# Patient Record
Sex: Female | Born: 1945 | Race: White | Hispanic: No | Smoking: Never smoker
Health system: Southern US, Community
[De-identification: ages and names within clinical notes are randomized; demographics above are authoritative.]

## PROBLEM LIST (undated history)

## (undated) DIAGNOSIS — K5790 Diverticulosis of intestine, part unspecified, without perforation or abscess without bleeding: Secondary | ICD-10-CM

## (undated) DIAGNOSIS — B029 Zoster without complications: Secondary | ICD-10-CM

## (undated) DIAGNOSIS — E119 Type 2 diabetes mellitus without complications: Secondary | ICD-10-CM

## (undated) DIAGNOSIS — C801 Malignant (primary) neoplasm, unspecified: Secondary | ICD-10-CM

## (undated) DIAGNOSIS — M6530 Trigger finger, unspecified finger: Secondary | ICD-10-CM

## (undated) DIAGNOSIS — I8390 Asymptomatic varicose veins of unspecified lower extremity: Secondary | ICD-10-CM

## (undated) DIAGNOSIS — H409 Unspecified glaucoma: Secondary | ICD-10-CM

## (undated) DIAGNOSIS — H269 Unspecified cataract: Secondary | ICD-10-CM

## (undated) DIAGNOSIS — R300 Dysuria: Secondary | ICD-10-CM

## (undated) DIAGNOSIS — K222 Esophageal obstruction: Secondary | ICD-10-CM

## (undated) DIAGNOSIS — R7989 Other specified abnormal findings of blood chemistry: Secondary | ICD-10-CM

## (undated) DIAGNOSIS — K449 Diaphragmatic hernia without obstruction or gangrene: Secondary | ICD-10-CM

## (undated) DIAGNOSIS — K219 Gastro-esophageal reflux disease without esophagitis: Secondary | ICD-10-CM

## (undated) DIAGNOSIS — E785 Hyperlipidemia, unspecified: Secondary | ICD-10-CM

## (undated) DIAGNOSIS — C50919 Malignant neoplasm of unspecified site of unspecified female breast: Secondary | ICD-10-CM

## (undated) DIAGNOSIS — M25559 Pain in unspecified hip: Secondary | ICD-10-CM

## (undated) DIAGNOSIS — G43009 Migraine without aura, not intractable, without status migrainosus: Secondary | ICD-10-CM

## (undated) HISTORY — DX: Dysuria: R30.0

## (undated) HISTORY — PX: ESOPHAGEAL DILATION: SHX303

## (undated) HISTORY — DX: Zoster without complications: B02.9

## (undated) HISTORY — DX: Unspecified glaucoma: H40.9

## (undated) HISTORY — DX: Pain in unspecified hip: M25.559

## (undated) HISTORY — DX: Type 2 diabetes mellitus without complications: E11.9

## (undated) HISTORY — DX: Migraine without aura, not intractable, without status migrainosus: G43.009

## (undated) HISTORY — DX: Trigger finger, unspecified finger: M65.30

## (undated) HISTORY — DX: Asymptomatic varicose veins of unspecified lower extremity: I83.90

## (undated) HISTORY — DX: Gastro-esophageal reflux disease without esophagitis: K21.9

## (undated) HISTORY — DX: Unspecified cataract: H26.9

## (undated) HISTORY — DX: Hyperlipidemia, unspecified: E78.5

## (undated) HISTORY — DX: Malignant neoplasm of unspecified site of unspecified female breast: C50.919

## (undated) HISTORY — DX: Malignant (primary) neoplasm, unspecified: C80.1

## (undated) HISTORY — DX: Diverticulosis of intestine, part unspecified, without perforation or abscess without bleeding: K57.90

## (undated) HISTORY — DX: Esophageal obstruction: K22.2

## (undated) HISTORY — DX: Other specified abnormal findings of blood chemistry: R79.89

## (undated) HISTORY — DX: Diaphragmatic hernia without obstruction or gangrene: K44.9

---

## 1971-07-27 HISTORY — PX: APPENDECTOMY: SHX54

## 1997-11-26 ENCOUNTER — Other Ambulatory Visit: Admission: RE | Admit: 1997-11-26 | Discharge: 1997-11-26 | Payer: Self-pay | Admitting: Obstetrics and Gynecology

## 1998-12-11 ENCOUNTER — Other Ambulatory Visit: Admission: RE | Admit: 1998-12-11 | Discharge: 1998-12-11 | Payer: Self-pay | Admitting: Obstetrics and Gynecology

## 2000-02-25 ENCOUNTER — Other Ambulatory Visit: Admission: RE | Admit: 2000-02-25 | Discharge: 2000-02-25 | Payer: Self-pay | Admitting: Obstetrics and Gynecology

## 2008-06-25 ENCOUNTER — Ambulatory Visit (HOSPITAL_COMMUNITY): Admission: RE | Admit: 2008-06-25 | Discharge: 2008-06-25 | Payer: Self-pay | Admitting: Family Medicine

## 2008-07-16 ENCOUNTER — Encounter: Admission: RE | Admit: 2008-07-16 | Discharge: 2008-07-16 | Payer: Self-pay | Admitting: Family Medicine

## 2008-07-16 ENCOUNTER — Encounter (INDEPENDENT_AMBULATORY_CARE_PROVIDER_SITE_OTHER): Payer: Self-pay | Admitting: Diagnostic Radiology

## 2008-07-24 ENCOUNTER — Encounter: Admission: RE | Admit: 2008-07-24 | Discharge: 2008-07-24 | Payer: Self-pay | Admitting: Family Medicine

## 2008-08-29 ENCOUNTER — Encounter (INDEPENDENT_AMBULATORY_CARE_PROVIDER_SITE_OTHER): Payer: Self-pay | Admitting: Surgery

## 2008-08-29 ENCOUNTER — Ambulatory Visit (HOSPITAL_COMMUNITY): Admission: RE | Admit: 2008-08-29 | Discharge: 2008-08-29 | Payer: Self-pay | Admitting: Surgery

## 2008-08-29 ENCOUNTER — Encounter: Admission: RE | Admit: 2008-08-29 | Discharge: 2008-08-29 | Payer: Self-pay | Admitting: Surgery

## 2008-08-29 DIAGNOSIS — C50511 Malignant neoplasm of lower-outer quadrant of right female breast: Secondary | ICD-10-CM | POA: Insufficient documentation

## 2008-08-29 HISTORY — PX: BREAST LUMPECTOMY: SHX2

## 2008-09-03 ENCOUNTER — Ambulatory Visit: Payer: Self-pay | Admitting: Oncology

## 2008-09-18 LAB — CBC WITH DIFFERENTIAL/PLATELET
Basophils Absolute: 0 10*3/uL (ref 0.0–0.1)
EOS%: 1.8 % (ref 0.0–7.0)
HCT: 40.5 % (ref 34.8–46.6)
HGB: 13.8 g/dL (ref 11.6–15.9)
MCH: 33 pg (ref 25.1–34.0)
MCV: 96.9 fL (ref 79.5–101.0)
MONO%: 8.1 % (ref 0.0–14.0)
NEUT%: 58.6 % (ref 38.4–76.8)
RDW: 12.6 % (ref 11.2–14.5)

## 2008-09-19 LAB — COMPREHENSIVE METABOLIC PANEL
AST: 17 U/L (ref 0–37)
Alkaline Phosphatase: 76 U/L (ref 39–117)
BUN: 12 mg/dL (ref 6–23)
Creatinine, Ser: 0.64 mg/dL (ref 0.40–1.20)
Total Bilirubin: 0.5 mg/dL (ref 0.3–1.2)

## 2008-09-24 ENCOUNTER — Encounter: Admission: RE | Admit: 2008-09-24 | Discharge: 2008-09-24 | Payer: Self-pay | Admitting: Oncology

## 2008-09-24 ENCOUNTER — Ambulatory Visit: Admission: RE | Admit: 2008-09-24 | Discharge: 2008-11-26 | Payer: Self-pay | Admitting: Radiation Oncology

## 2008-09-24 LAB — HM DEXA SCAN

## 2008-10-16 DIAGNOSIS — E6609 Other obesity due to excess calories: Secondary | ICD-10-CM | POA: Insufficient documentation

## 2008-10-16 DIAGNOSIS — E66812 Obesity, class 2: Secondary | ICD-10-CM | POA: Insufficient documentation

## 2008-11-13 ENCOUNTER — Ambulatory Visit: Payer: Self-pay | Admitting: Oncology

## 2009-02-12 ENCOUNTER — Ambulatory Visit: Payer: Self-pay | Admitting: Oncology

## 2009-02-14 LAB — CBC WITH DIFFERENTIAL/PLATELET
Basophils Absolute: 0 10*3/uL (ref 0.0–0.1)
Eosinophils Absolute: 0.2 10*3/uL (ref 0.0–0.5)
HCT: 37.5 % (ref 34.8–46.6)
HGB: 12.8 g/dL (ref 11.6–15.9)
MCH: 33.4 pg (ref 25.1–34.0)
MONO#: 0.4 10*3/uL (ref 0.1–0.9)
NEUT#: 3.6 10*3/uL (ref 1.5–6.5)
NEUT%: 69.9 % (ref 38.4–76.8)
WBC: 5.2 10*3/uL (ref 3.9–10.3)
lymph#: 0.9 10*3/uL (ref 0.9–3.3)

## 2009-02-28 ENCOUNTER — Encounter: Admission: RE | Admit: 2009-02-28 | Discharge: 2009-02-28 | Payer: Self-pay | Admitting: Gastroenterology

## 2009-06-03 ENCOUNTER — Ambulatory Visit: Payer: Self-pay | Admitting: Oncology

## 2009-06-05 LAB — CBC WITH DIFFERENTIAL/PLATELET
Basophils Absolute: 0 10*3/uL (ref 0.0–0.1)
EOS%: 2.8 % (ref 0.0–7.0)
HGB: 12.7 g/dL (ref 11.6–15.9)
MCH: 33.8 pg (ref 25.1–34.0)
MCV: 99.6 fL (ref 79.5–101.0)
MONO%: 8.9 % (ref 0.0–14.0)
RBC: 3.77 10*6/uL (ref 3.70–5.45)
RDW: 13.1 % (ref 11.2–14.5)

## 2009-06-05 LAB — COMPREHENSIVE METABOLIC PANEL
AST: 26 U/L (ref 0–37)
Albumin: 3.7 g/dL (ref 3.5–5.2)
Alkaline Phosphatase: 56 U/L (ref 39–117)
BUN: 9 mg/dL (ref 6–23)
Potassium: 4 mEq/L (ref 3.5–5.3)
Total Bilirubin: 0.7 mg/dL (ref 0.3–1.2)

## 2009-06-06 LAB — VITAMIN D 25 HYDROXY (VIT D DEFICIENCY, FRACTURES): Vit D, 25-Hydroxy: 42 ng/mL (ref 30–89)

## 2009-06-13 ENCOUNTER — Ambulatory Visit (HOSPITAL_COMMUNITY): Admission: RE | Admit: 2009-06-13 | Discharge: 2009-06-13 | Payer: Self-pay | Admitting: Oncology

## 2009-06-23 ENCOUNTER — Ambulatory Visit (HOSPITAL_COMMUNITY): Admission: RE | Admit: 2009-06-23 | Discharge: 2009-06-23 | Payer: Self-pay | Admitting: Obstetrics and Gynecology

## 2009-06-23 ENCOUNTER — Encounter: Admission: RE | Admit: 2009-06-23 | Discharge: 2009-06-23 | Payer: Self-pay | Admitting: Oncology

## 2009-12-01 ENCOUNTER — Ambulatory Visit: Payer: Self-pay | Admitting: Oncology

## 2009-12-02 LAB — CBC WITH DIFFERENTIAL/PLATELET
EOS%: 1.9 % (ref 0.0–7.0)
LYMPH%: 25.4 % (ref 14.0–49.7)
MCH: 34.5 pg — ABNORMAL HIGH (ref 25.1–34.0)
MCHC: 35 g/dL (ref 31.5–36.0)
MCV: 98.6 fL (ref 79.5–101.0)
MONO%: 10 % (ref 0.0–14.0)
Platelets: 261 10*3/uL (ref 145–400)
RBC: 3.75 10*6/uL (ref 3.70–5.45)
RDW: 12.8 % (ref 11.2–14.5)

## 2009-12-02 LAB — COMPREHENSIVE METABOLIC PANEL
AST: 17 U/L (ref 0–37)
Albumin: 4.3 g/dL (ref 3.5–5.2)
Alkaline Phosphatase: 69 U/L (ref 39–117)
Glucose, Bld: 102 mg/dL — ABNORMAL HIGH (ref 70–99)
Potassium: 4.6 mEq/L (ref 3.5–5.3)
Sodium: 138 mEq/L (ref 135–145)
Total Bilirubin: 0.4 mg/dL (ref 0.3–1.2)
Total Protein: 7.1 g/dL (ref 6.0–8.3)

## 2009-12-02 LAB — VITAMIN D 25 HYDROXY (VIT D DEFICIENCY, FRACTURES): Vit D, 25-Hydroxy: 54 ng/mL (ref 30–89)

## 2009-12-02 LAB — CANCER ANTIGEN 27.29: CA 27.29: 22 U/mL (ref 0–39)

## 2010-06-26 ENCOUNTER — Encounter
Admission: RE | Admit: 2010-06-26 | Discharge: 2010-06-26 | Payer: Self-pay | Source: Home / Self Care | Admitting: Oncology

## 2010-07-01 ENCOUNTER — Ambulatory Visit: Payer: Self-pay | Admitting: Oncology

## 2010-07-03 LAB — CBC WITH DIFFERENTIAL/PLATELET
Basophils Absolute: 0 10*3/uL (ref 0.0–0.1)
Eosinophils Absolute: 0.1 10*3/uL (ref 0.0–0.5)
HCT: 40 % (ref 34.8–46.6)
HGB: 13.6 g/dL (ref 11.6–15.9)
MCH: 34.2 pg — ABNORMAL HIGH (ref 25.1–34.0)
MCV: 100.5 fL (ref 79.5–101.0)
NEUT#: 3.8 10*3/uL (ref 1.5–6.5)
NEUT%: 64 % (ref 38.4–76.8)
RDW: 12.8 % (ref 11.2–14.5)
lymph#: 1.4 10*3/uL (ref 0.9–3.3)

## 2010-07-04 LAB — COMPREHENSIVE METABOLIC PANEL
Albumin: 4.3 g/dL (ref 3.5–5.2)
BUN: 9 mg/dL (ref 6–23)
CO2: 29 mEq/L (ref 19–32)
Calcium: 9.7 mg/dL (ref 8.4–10.5)
Chloride: 101 mEq/L (ref 96–112)
Creatinine, Ser: 0.67 mg/dL (ref 0.40–1.20)
Glucose, Bld: 101 mg/dL — ABNORMAL HIGH (ref 70–99)
Potassium: 4.4 mEq/L (ref 3.5–5.3)

## 2010-07-04 LAB — VITAMIN D 25 HYDROXY (VIT D DEFICIENCY, FRACTURES): Vit D, 25-Hydroxy: 71 ng/mL (ref 30–89)

## 2010-07-04 LAB — LACTATE DEHYDROGENASE: LDH: 148 U/L (ref 94–250)

## 2010-07-04 LAB — CANCER ANTIGEN 27.29: CA 27.29: 26 U/mL (ref 0–39)

## 2010-07-14 ENCOUNTER — Ambulatory Visit (HOSPITAL_COMMUNITY)
Admission: RE | Admit: 2010-07-14 | Discharge: 2010-07-14 | Payer: Self-pay | Source: Home / Self Care | Attending: Oncology | Admitting: Oncology

## 2010-08-27 ENCOUNTER — Other Ambulatory Visit: Payer: Self-pay | Admitting: Oncology

## 2010-08-27 DIAGNOSIS — Z780 Asymptomatic menopausal state: Secondary | ICD-10-CM

## 2010-10-06 ENCOUNTER — Ambulatory Visit
Admission: RE | Admit: 2010-10-06 | Discharge: 2010-10-06 | Disposition: A | Discharge status: Home / Self Care | Payer: Medicare Other | Source: Ambulatory Visit | Attending: Oncology | Admitting: Oncology

## 2010-10-06 DIAGNOSIS — Z780 Asymptomatic menopausal state: Secondary | ICD-10-CM

## 2010-11-10 LAB — DIFFERENTIAL
Basophils Absolute: 0 10*3/uL (ref 0.0–0.1)
Eosinophils Absolute: 0.1 10*3/uL (ref 0.0–0.7)
Eosinophils Relative: 1 % (ref 0–5)
Lymphocytes Relative: 32 % (ref 12–46)
Neutrophils Relative %: 59 % (ref 43–77)

## 2010-11-10 LAB — BASIC METABOLIC PANEL WITH GFR
BUN: 9 mg/dL (ref 6–23)
CO2: 29 meq/L (ref 19–32)
Calcium: 10 mg/dL (ref 8.4–10.5)
Chloride: 101 meq/L (ref 96–112)
Creatinine, Ser: 0.6 mg/dL (ref 0.4–1.2)
GFR calc Af Amer: >60 mL/min (ref >60)
GFR calc non Af Amer: >60 mL/min (ref >60)
Glucose, Bld: 107 mg/dL — ABNORMAL HIGH (ref 70–99)
Potassium: 4.2 meq/L (ref 3.5–5.1)
Sodium: 139 meq/L (ref 135–145)

## 2010-11-10 LAB — CBC
HCT: 42 % (ref 36.0–46.0)
Platelets: 267 10*3/uL (ref 150–400)
RDW: 12.8 % (ref 11.5–15.5)

## 2010-12-08 NOTE — Op Note (Signed)
[name], [name]                 ACCOUNT NO.:  [id number]   MEDICAL RECORD NO.:  [id number]          PATIENT TYPE:  AMB   LOCATION:  SDS                          FACILITY:  [facility]   PHYSICIAN:  Thomas A. Cornett, M.D.DATE OF BIRTH:  [birth year]   DATE OF PROCEDURE:  08/29/2008  DATE OF DISCHARGE:  08/29/2008                               OPERATIVE REPORT   PREOPERATIVE DIAGNOSIS:  Right breast cancer, stage I.   POSTOPERATIVE DIAGNOSIS:  Right breast cancer, stage I.   PROCEDURE:  1. Right breast needle-localized lumpectomy.  2. Right axillary sentinel lymph node mapping with injection of      methylene blue dye.   SURGEON:  Maisie Fus A. Cornett, MD   ANESTHESIA:  General LMA with 0.25% Sensorcaine local with epinephrine.   ESTIMATED BLOOD LOSS:  20 mL.   SPECIMEN:  1. Two right axillary sentinel lymph nodes negative by touch prep.  2. Right breast mass localizing wire clip and an additional anterior      margin to pathology for evaluation.   INDICATIONS FOR PROCEDURE:  The patient is a 65 year old postmenopausal  female who is found to have a very small right breast cancer in the  right upper outer quadrant of her right breast.  We talked about  options.  She wished to conserve her breast and presents today for right  breast lumpectomy and subsequent sentinel lymph node mapping for breast  conservative measures stage I right breast cancer.   DESCRIPTION OF PROCEDURE:  The patient was brought to the operating room  and placed spine.  After induction of anesthesia, the patient had  already undergone wire localization and injection of technetium sulfur  colloid prior to arriving to the operating room.  After induction of  general anesthesia, right breast and right axilla were exposed.  I  injected 4 mL of methylene blue dye in a subareolar position in the  right breast.  Five minutes were allowed to elapse.  Right breast and  right axilla were prepped and draped in a sterile  fashion.  Sentinel  node was done first.  NeoProbe was used to identify a hot region in the  right axilla.  The tumor is in the right upper outer quadrant of her  breast and that is where the wire exited from her needle localization.  A curvilinear incision was made halfway between the wire and the axilla  to do both parts of the procedure through one incision.  Dissection was  carried down until I entered the axilla.  NeoProbe was used and one hot  blue and one just hot sentinel node were identified and passed off the  field.  There was no other significant activity in her axilla upon  inspection with a NeoProbe.  Next, lumpectomy was done.  I brought the  wire out to the incision.  All tissue around the wire was excised in its  entirety.  I also took an additional anterior margin since this seemed  close to me after looking at her films and feeling the specimen.  Her  other margins were down to  the chest wall posteriorly and the superior  and inferior margins appeared grossly negative.  Medial margin also  appeared grossly negative.  Radiographs were done which showed the  specimen to be adequate with a clip wire and tumor on within it.  Irrigation was used and suctioned out.  The axilla was examined and  found be hemostatic after irrigation.  Surgicel was placed in the right  axilla.  I closed the deep layer of tissue for the axilla with a 3-0  Vicryl.  I then closed the lumpectomy cavity down  with a 3-0 Vicryl as well at this point in time.  Skin was closed with 4-  0 Monocryl.  Dermabond was applied.  All final counts of sponge, needle,  and instruments were found to be correct at this portion of the case.  The patient was then awoken and taken to the recovery room in  satisfactory condition.      Thomas A. Cornett, M.D.  Electronically Signed     TAC/MEDQ  D:  08/29/2008  T:  08/29/2008  Job:  [id number]   cc:   Georgiann Mccoy, MD

## 2010-12-30 ENCOUNTER — Other Ambulatory Visit: Payer: Self-pay | Admitting: Oncology

## 2010-12-30 ENCOUNTER — Encounter (HOSPITAL_BASED_OUTPATIENT_CLINIC_OR_DEPARTMENT_OTHER): Payer: Medicare Other | Admitting: Oncology

## 2010-12-30 DIAGNOSIS — M818 Other osteoporosis without current pathological fracture: Secondary | ICD-10-CM

## 2010-12-30 DIAGNOSIS — Z170 Estrogen receptor positive status [ER+]: Secondary | ICD-10-CM

## 2010-12-30 DIAGNOSIS — C50419 Malignant neoplasm of upper-outer quadrant of unspecified female breast: Secondary | ICD-10-CM

## 2010-12-30 LAB — COMPREHENSIVE METABOLIC PANEL
ALT: 18 U/L (ref 0–35)
CO2: 29 mEq/L (ref 19–32)
Calcium: 10.3 mg/dL (ref 8.4–10.5)
Chloride: 102 mEq/L (ref 96–112)
Sodium: 138 mEq/L (ref 135–145)
Total Protein: 7.7 g/dL (ref 6.0–8.3)

## 2010-12-30 LAB — CBC WITH DIFFERENTIAL/PLATELET
BASO%: 0.4 % (ref 0.0–2.0)
HCT: 40.4 % (ref 34.8–46.6)
MCHC: 34 g/dL (ref 31.5–36.0)
MONO#: 0.6 10*3/uL (ref 0.1–0.9)
RBC: 4 10*6/uL (ref 3.70–5.45)
WBC: 5.9 10*3/uL (ref 3.9–10.3)
lymph#: 1.8 10*3/uL (ref 0.9–3.3)

## 2010-12-30 LAB — CANCER ANTIGEN 27.29: CA 27.29: 26 U/mL (ref 0–39)

## 2011-01-08 ENCOUNTER — Encounter: Payer: Medicare Other | Admitting: Oncology

## 2011-01-21 ENCOUNTER — Other Ambulatory Visit: Payer: Self-pay | Admitting: Gastroenterology

## 2011-01-21 ENCOUNTER — Ambulatory Visit (HOSPITAL_COMMUNITY)
Admission: RE | Admit: 2011-01-21 | Discharge: 2011-01-21 | Disposition: A | Discharge status: Home / Self Care | Payer: Medicare Other | Source: Ambulatory Visit | Attending: Gastroenterology | Admitting: Gastroenterology

## 2011-01-21 DIAGNOSIS — K222 Esophageal obstruction: Secondary | ICD-10-CM | POA: Insufficient documentation

## 2011-01-21 DIAGNOSIS — R1310 Dysphagia, unspecified: Secondary | ICD-10-CM | POA: Insufficient documentation

## 2011-02-17 NOTE — Op Note (Signed)
  NAME:  [name], [name] NO.:  [id number]  MEDICAL RECORD NO.:  [id number]  LOCATION:                                 FACILITY:  PHYSICIAN:  Graylin Shiver, M.D.   DATE OF BIRTH:  [birth year]  DATE OF PROCEDURE: DATE OF DISCHARGE:                              OPERATIVE REPORT   PROCEDURE:  Upper endoscopy with biopsy and endoscopic balloon dilatation.  INDICATIONS FOR PROCEDURE:  Dysphagia, prior barium swallow showed a Schatzki's ring.  Informed consent was obtained after explanation of the risks of bleeding, infection, and perforation.  PREMEDICATION:  Fentanyl 50 mcg IV, Versed 6 mg IV.  DESCRIPTION OF PROCEDURE:  With the patient in the left lateral decubitus position, the PentaX gastroscope was inserted into the oropharynx and passed into the esophagus.  It was advanced down the esophagus, then into the stomach, and into the duodenum.  The second portion of the duodenum looked normal.  In the bulb of the duodenum, there was a small ulcer.  The stomach was inspected and there was a small ulcer in the distal stomach.  Biopsy of this was obtained and also biopsy of the antrum primarily to look for H. pylori.  The rest of the stomach had normal-appearing mucosa.  There was a hiatal hernia.  The esophagus revealed a stricture at the region of the distal esophagus, which looked like a combination of a Schatzki's ring and peptic stricture.  I was able to pass the 10-mm scope through this area without difficulty.  There was erosive distal esophagitis above the strictured area.  Biopsy of this was obtained.  An endoscopic balloon dilator was advanced down the scope and placed at the level of the stricture.  The stricture was dilated to 12, then 13.5, then 15 mm.  She tolerated the procedure well without complications.  IMPRESSION: 1. Distal esophageal stricture, which looks like a combination of a     Schatzki's ring and a peptic stricture. 2. Distal  esophagitis, which was erosive in nature. 3. Small gastric ulcer. 4. Small duodenal ulcer. 5. Hiatal hernia.  PLAN:  Observe response to the dilatation, proceed with using Prilosec 20 mg p.o. b.i.d.          ______________________________ Graylin Shiver, M.D.    SFG/MEDQ  D:  01/22/2011  T:  01/22/2011  Job:  [id number]  Electronically Signed by Herbert Moors MD on 02/17/2011 03:43:55 PM

## 2011-03-04 ENCOUNTER — Ambulatory Visit (INDEPENDENT_AMBULATORY_CARE_PROVIDER_SITE_OTHER): Payer: Medicare Other | Admitting: Surgery

## 2011-03-04 ENCOUNTER — Encounter (INDEPENDENT_AMBULATORY_CARE_PROVIDER_SITE_OTHER): Payer: Self-pay | Admitting: Surgery

## 2011-03-04 DIAGNOSIS — Z853 Personal history of malignant neoplasm of breast: Secondary | ICD-10-CM

## 2011-03-04 NOTE — Patient Instructions (Signed)
Follow up in 1 year.

## 2011-03-04 NOTE — Progress Notes (Signed)
Subjective:     Patient ID: [name], female   DOB: 21-Apr-1946, 65 y.o.   MRN: [id number]  HPI The patient returns today in followup for her stage I right breast cancer. She underwent a right breast lumpectomy with sentinel lymph node mapping in February of 2010 for a T1 N0 MX ER positive PR positive HER-2/neu negative right breast cancer. She underwent lumpectomy and subsequent post operative radiation therapy. She is being maintained on femara. She still has some mild discomfort in her right breast. She denies any nipple discharge or any new masses that she can feel.  Review of Systems  Constitutional: Negative.   HENT: Negative.   Eyes: Negative.   Respiratory: Negative.   Cardiovascular: Negative.   Gastrointestinal: Negative.   Genitourinary: Negative.   Neurological: Negative.   Hematological: Negative.   Psychiatric/Behavioral: Negative.        Objective:   Physical Exam  Constitutional: She appears well-developed and well-nourished.  HENT:  Head: Normocephalic and atraumatic.  Nose: Nose normal.  Eyes: Conjunctivae and EOM are normal. Pupils are equal, round, and reactive to light.  Neck: Normal range of motion. Neck supple.  Cardiovascular: Normal rate, regular rhythm and normal heart sounds.   Pulmonary/Chest: Effort normal and breath sounds normal.       Both breasts were examined today. Postsurgical changes noted right breast. Minimal volume loss to right breast. No suspicious masses in the right breast. Chronic inversion of right nipple noted. Right axilla is normal. Left breast is normal. The left axilla normal  Lymphadenopathy:    She has no cervical adenopathy.       Assessment:     Stage I right breast cancer    Plan:     Her next mammogram is due in December 2012. Her mammogram from 2011 and was within normal limits. She will continue her followup with Dr. Donnie Coffin I will see her back.

## 2011-04-30 DIAGNOSIS — B0051 Herpesviral iridocyclitis: Secondary | ICD-10-CM | POA: Insufficient documentation

## 2011-04-30 DIAGNOSIS — H40049 Steroid responder, unspecified eye: Secondary | ICD-10-CM | POA: Insufficient documentation

## 2011-04-30 DIAGNOSIS — Z9849 Cataract extraction status, unspecified eye: Secondary | ICD-10-CM | POA: Insufficient documentation

## 2011-05-19 ENCOUNTER — Encounter (HOSPITAL_BASED_OUTPATIENT_CLINIC_OR_DEPARTMENT_OTHER): Payer: Medicare Other | Admitting: Oncology

## 2011-05-19 DIAGNOSIS — Z23 Encounter for immunization: Secondary | ICD-10-CM

## 2011-05-19 DIAGNOSIS — C50419 Malignant neoplasm of upper-outer quadrant of unspecified female breast: Secondary | ICD-10-CM

## 2011-05-27 ENCOUNTER — Other Ambulatory Visit: Payer: Self-pay | Admitting: Oncology

## 2011-05-27 DIAGNOSIS — Z853 Personal history of malignant neoplasm of breast: Secondary | ICD-10-CM

## 2011-06-29 ENCOUNTER — Ambulatory Visit
Admission: RE | Admit: 2011-06-29 | Discharge: 2011-06-29 | Disposition: A | Discharge status: Home / Self Care | Payer: Medicare Other | Source: Ambulatory Visit | Attending: Oncology | Admitting: Oncology

## 2011-06-29 DIAGNOSIS — Z853 Personal history of malignant neoplasm of breast: Secondary | ICD-10-CM

## 2011-07-08 ENCOUNTER — Other Ambulatory Visit: Payer: Self-pay | Admitting: Oncology

## 2011-07-08 ENCOUNTER — Other Ambulatory Visit (HOSPITAL_BASED_OUTPATIENT_CLINIC_OR_DEPARTMENT_OTHER): Payer: Medicare Other | Admitting: Lab

## 2011-07-08 DIAGNOSIS — C50419 Malignant neoplasm of upper-outer quadrant of unspecified female breast: Secondary | ICD-10-CM

## 2011-07-08 LAB — COMPREHENSIVE METABOLIC PANEL
AST: 20 U/L (ref 0–37)
Alkaline Phosphatase: 58 U/L (ref 39–117)
BUN: 12 mg/dL (ref 6–23)
Creatinine, Ser: 0.59 mg/dL (ref 0.50–1.10)

## 2011-07-08 LAB — CBC WITH DIFFERENTIAL/PLATELET
Basophils Absolute: 0 10*3/uL (ref 0.0–0.1)
EOS%: 1.4 % (ref 0.0–7.0)
HCT: 39.6 % (ref 34.8–46.6)
HGB: 13.3 g/dL (ref 11.6–15.9)
LYMPH%: 27.4 % (ref 14.0–49.7)
MCH: 33.3 pg (ref 25.1–34.0)
MCV: 99.2 fL (ref 79.5–101.0)
MONO%: 8.3 % (ref 0.0–14.0)
NEUT%: 62.6 % (ref 38.4–76.8)
Platelets: 249 10*3/uL (ref 145–400)

## 2011-07-08 LAB — VITAMIN D 25 HYDROXY (VIT D DEFICIENCY, FRACTURES): Vit D, 25-Hydroxy: 50 ng/mL (ref 30–89)

## 2011-07-15 ENCOUNTER — Ambulatory Visit (HOSPITAL_BASED_OUTPATIENT_CLINIC_OR_DEPARTMENT_OTHER): Payer: Medicare Other | Admitting: Oncology

## 2011-07-15 VITALS — BP 146/78 | HR 84 | Temp 99.1°F | Ht 64.0 in | Wt 214.6 lb

## 2011-07-15 DIAGNOSIS — M899 Disorder of bone, unspecified: Secondary | ICD-10-CM

## 2011-07-15 DIAGNOSIS — Z79811 Long term (current) use of aromatase inhibitors: Secondary | ICD-10-CM

## 2011-07-15 DIAGNOSIS — C50919 Malignant neoplasm of unspecified site of unspecified female breast: Secondary | ICD-10-CM

## 2011-07-15 DIAGNOSIS — Z170 Estrogen receptor positive status [ER+]: Secondary | ICD-10-CM

## 2011-07-15 DIAGNOSIS — M949 Disorder of cartilage, unspecified: Secondary | ICD-10-CM

## 2011-07-15 MED ORDER — LETROZOLE 2.5 MG PO TABS: 2.5000 mg | ORAL_TABLET | Freq: Every day | ORAL | Status: AC

## 2011-07-15 NOTE — Progress Notes (Signed)
Hematology and Oncology Follow Up Visit  [name] [id number] 1946/01/10 65 y.o. 07/15/2011 3:32 PM PCP Gaspar Garbe Principle Diagnosis: T1bNo er+pr+ breast cancer, s/p lumpevtomy with completion of xrt 4/10, on femara 2. Hx of shingles affectig her eye.  Interim History:  There have been no intercurrent illness, hospitalizations or medication changes. Ongoing visual problems with evoluton of cataracts on rt, glaucoma; previous hx of shingles.Has been on multiple  Courses of valtrex( generic doesn't work).  Medications: I have reviewed the patient's current medications.  Allergies:  Allergies  Allergen Reactions  . Penicillins   . Thimerosal     Makes pt eye red     Past Medical History, Surgical history, Social history, and Family History were reviewed and updated.  Review of Systems: Constitutional:  Negative for fever, chills, night sweats, anorexia, weight loss, pain. Cardiovascular: no chest pain or dyspnea on exertion Respiratory: no cough, shortness of breath, or wheezing Neurological: no TIA or stroke symptoms Dermatological: negative ENT: positive for - visual changes Skin Gastrointestinal: no abdominal pain, change in bowel habits, or black or bloody stools Genito-Urinary: no dysuria, trouble voiding, or hematuria Hematological and Lymphatic: negative Breast: negative for breast lumps Musculoskeletal: negative Remaining ROS negative.  Physical Exam: Blood pressure 146/78, pulse 84, temperature 99.1 F (37.3 C), height 5\' 4"  (1.626 m), weight 214 lb 9.6 oz (97.342 kg). ECOG:  General appearance: alert, cooperative and appears stated age Head: Normocephalic, without obvious abnormality, atraumatic Neck: no adenopathy, no carotid bruit, no JVD, supple, symmetrical, trachea midline and thyroid not enlarged, symmetric, no tenderness/mass/nodules Lymph nodes: Cervical, supraclavicular, and axillary nodes normal. Cardiac : regular rate and rhythm, no murmurs or  gallops Pulmonary:clear to auscultation bilaterally and normal percussion bilaterally Breasts: inspection negative, no nipple discharge or bleeding, no masses or nodularity palpable, frt nipple retracted -chronic Abdomen:soft, non-tender; bowel sounds normal; no masses,  no organomegaly Extremities negative Neuro: alert, oriented, normal speech, no focal findings or movement disorder noted  Lab Results: Lab Results  Component Value Date   WBC 5.5 07/08/2011   HGB 13.3 07/08/2011   HCT 39.6 07/08/2011   MCV 99.2 07/08/2011   PLT 249 07/08/2011     Chemistry      Component Value Date/Time   NA 142 07/08/2011 1414   NA 142 07/08/2011 1414   K 4.4 07/08/2011 1414   K 4.4 07/08/2011 1414   CL 102 07/08/2011 1414   CL 102 07/08/2011 1414   CO2 30 07/08/2011 1414   CO2 30 07/08/2011 1414   BUN 12 07/08/2011 1414   BUN 12 07/08/2011 1414   CREATININE 0.59 07/08/2011 1414   CREATININE 0.59 07/08/2011 1414      Component Value Date/Time   CALCIUM 10.6* 07/08/2011 1414   CALCIUM 10.6* 07/08/2011 1414   ALKPHOS 58 07/08/2011 1414   ALKPHOS 58 07/08/2011 1414   AST 20 07/08/2011 1414   AST 20 07/08/2011 1414   ALT 18 07/08/2011 1414   ALT 18 07/08/2011 1414   BILITOT 0.4 07/08/2011 1414   BILITOT 0.4 07/08/2011 1414      .pathology. Radiological Studies: chest X-ray n/a Mammogram Recent 12/12-wnl Bone density 3/12- improved, minimal osteopenia  Impression and Plan: N-, er+ breast cancer, on femara, trolerated well, NED; f/u 6 months.  More than 50% of the visit was spent in patient-related counselling   Pierce Crane, MD 12/20/20123:32 PM

## 2011-08-03 DIAGNOSIS — Z9889 Other specified postprocedural states: Secondary | ICD-10-CM | POA: Diagnosis not present

## 2011-08-03 DIAGNOSIS — H33319 Horseshoe tear of retina without detachment, unspecified eye: Secondary | ICD-10-CM | POA: Diagnosis not present

## 2011-08-03 DIAGNOSIS — H269 Unspecified cataract: Secondary | ICD-10-CM | POA: Diagnosis not present

## 2011-08-03 DIAGNOSIS — H33311 Horseshoe tear of retina without detachment, right eye: Secondary | ICD-10-CM | POA: Insufficient documentation

## 2011-08-03 DIAGNOSIS — B0051 Herpesviral iridocyclitis: Secondary | ICD-10-CM | POA: Diagnosis not present

## 2011-08-03 DIAGNOSIS — H2510 Age-related nuclear cataract, unspecified eye: Secondary | ICD-10-CM | POA: Diagnosis not present

## 2011-08-03 DIAGNOSIS — H40049 Steroid responder, unspecified eye: Secondary | ICD-10-CM | POA: Diagnosis not present

## 2011-08-18 ENCOUNTER — Other Ambulatory Visit: Payer: Self-pay | Admitting: Oncology

## 2011-08-18 DIAGNOSIS — C50419 Malignant neoplasm of upper-outer quadrant of unspecified female breast: Secondary | ICD-10-CM

## 2011-08-18 DIAGNOSIS — M818 Other osteoporosis without current pathological fracture: Secondary | ICD-10-CM

## 2011-08-25 DIAGNOSIS — H269 Unspecified cataract: Secondary | ICD-10-CM | POA: Diagnosis not present

## 2011-09-01 DIAGNOSIS — K219 Gastro-esophageal reflux disease without esophagitis: Secondary | ICD-10-CM | POA: Insufficient documentation

## 2011-09-01 DIAGNOSIS — Z9089 Acquired absence of other organs: Secondary | ICD-10-CM | POA: Diagnosis not present

## 2011-09-01 DIAGNOSIS — B0051 Herpesviral iridocyclitis: Secondary | ICD-10-CM | POA: Diagnosis not present

## 2011-09-01 DIAGNOSIS — Z9889 Other specified postprocedural states: Secondary | ICD-10-CM | POA: Diagnosis not present

## 2011-09-01 DIAGNOSIS — Z01810 Encounter for preprocedural cardiovascular examination: Secondary | ICD-10-CM | POA: Diagnosis not present

## 2011-09-01 DIAGNOSIS — E669 Obesity, unspecified: Secondary | ICD-10-CM | POA: Diagnosis not present

## 2011-09-01 DIAGNOSIS — H2589 Other age-related cataract: Secondary | ICD-10-CM | POA: Diagnosis not present

## 2011-09-02 DIAGNOSIS — K222 Esophageal obstruction: Secondary | ICD-10-CM | POA: Diagnosis not present

## 2011-09-06 DIAGNOSIS — H2589 Other age-related cataract: Secondary | ICD-10-CM | POA: Diagnosis not present

## 2011-09-06 DIAGNOSIS — Z9889 Other specified postprocedural states: Secondary | ICD-10-CM | POA: Diagnosis not present

## 2011-09-06 DIAGNOSIS — Z9089 Acquired absence of other organs: Secondary | ICD-10-CM | POA: Diagnosis not present

## 2011-09-06 DIAGNOSIS — E669 Obesity, unspecified: Secondary | ICD-10-CM | POA: Diagnosis not present

## 2011-09-06 DIAGNOSIS — K219 Gastro-esophageal reflux disease without esophagitis: Secondary | ICD-10-CM | POA: Diagnosis not present

## 2011-09-06 DIAGNOSIS — B0051 Herpesviral iridocyclitis: Secondary | ICD-10-CM | POA: Diagnosis not present

## 2011-09-14 DIAGNOSIS — H269 Unspecified cataract: Secondary | ICD-10-CM | POA: Diagnosis not present

## 2011-10-05 DIAGNOSIS — H2010 Chronic iridocyclitis, unspecified eye: Secondary | ICD-10-CM | POA: Diagnosis not present

## 2011-10-05 DIAGNOSIS — H269 Unspecified cataract: Secondary | ICD-10-CM | POA: Diagnosis not present

## 2011-10-11 DIAGNOSIS — M79609 Pain in unspecified limb: Secondary | ICD-10-CM | POA: Diagnosis not present

## 2011-10-11 DIAGNOSIS — E785 Hyperlipidemia, unspecified: Secondary | ICD-10-CM | POA: Diagnosis not present

## 2011-10-11 DIAGNOSIS — K222 Esophageal obstruction: Secondary | ICD-10-CM | POA: Diagnosis not present

## 2011-10-13 DIAGNOSIS — R7989 Other specified abnormal findings of blood chemistry: Secondary | ICD-10-CM | POA: Diagnosis not present

## 2011-10-13 DIAGNOSIS — I10 Essential (primary) hypertension: Secondary | ICD-10-CM | POA: Diagnosis not present

## 2011-10-13 DIAGNOSIS — E785 Hyperlipidemia, unspecified: Secondary | ICD-10-CM | POA: Diagnosis not present

## 2011-10-26 DIAGNOSIS — H269 Unspecified cataract: Secondary | ICD-10-CM | POA: Diagnosis not present

## 2011-10-26 DIAGNOSIS — H35379 Puckering of macula, unspecified eye: Secondary | ICD-10-CM | POA: Diagnosis not present

## 2011-10-26 DIAGNOSIS — B0051 Herpesviral iridocyclitis: Secondary | ICD-10-CM | POA: Diagnosis not present

## 2011-12-28 DIAGNOSIS — B0051 Herpesviral iridocyclitis: Secondary | ICD-10-CM | POA: Diagnosis not present

## 2012-01-07 ENCOUNTER — Other Ambulatory Visit: Payer: Self-pay | Admitting: Oncology

## 2012-01-07 ENCOUNTER — Other Ambulatory Visit (HOSPITAL_BASED_OUTPATIENT_CLINIC_OR_DEPARTMENT_OTHER): Payer: Medicare Other | Admitting: Lab

## 2012-01-07 DIAGNOSIS — E559 Vitamin D deficiency, unspecified: Secondary | ICD-10-CM

## 2012-01-07 DIAGNOSIS — C50919 Malignant neoplasm of unspecified site of unspecified female breast: Secondary | ICD-10-CM

## 2012-01-07 DIAGNOSIS — C50419 Malignant neoplasm of upper-outer quadrant of unspecified female breast: Secondary | ICD-10-CM

## 2012-01-07 LAB — CBC WITH DIFFERENTIAL/PLATELET
BASO%: 0.6 % (ref 0.0–2.0)
EOS%: 1.5 % (ref 0.0–7.0)
MCH: 33.3 pg (ref 25.1–34.0)
MCHC: 33.9 g/dL (ref 31.5–36.0)
MCV: 98.4 fL (ref 79.5–101.0)
MONO%: 11.7 % (ref 0.0–14.0)
NEUT#: 4.1 10*3/uL (ref 1.5–6.5)
RBC: 3.81 10*6/uL (ref 3.70–5.45)
RDW: 12.6 % (ref 11.2–14.5)

## 2012-01-08 LAB — COMPREHENSIVE METABOLIC PANEL
AST: 15 U/L (ref 0–37)
Albumin: 3.9 g/dL (ref 3.5–5.2)
Alkaline Phosphatase: 60 U/L (ref 39–117)
Potassium: 4.3 mEq/L (ref 3.5–5.3)
Sodium: 141 mEq/L (ref 135–145)
Total Protein: 6.6 g/dL (ref 6.0–8.3)

## 2012-01-13 ENCOUNTER — Telehealth: Payer: Self-pay | Admitting: *Deleted

## 2012-01-13 DIAGNOSIS — I10 Essential (primary) hypertension: Secondary | ICD-10-CM | POA: Diagnosis not present

## 2012-01-13 DIAGNOSIS — E785 Hyperlipidemia, unspecified: Secondary | ICD-10-CM | POA: Diagnosis not present

## 2012-01-13 NOTE — Telephone Encounter (Signed)
per orders from 01-13-2012 moved patient to 01-18-2012 at 4:00pm patient confirmed over the phone

## 2012-01-14 ENCOUNTER — Ambulatory Visit: Payer: Medicare Other | Admitting: Oncology

## 2012-01-18 ENCOUNTER — Telehealth: Payer: Self-pay | Admitting: *Deleted

## 2012-01-18 ENCOUNTER — Ambulatory Visit (HOSPITAL_BASED_OUTPATIENT_CLINIC_OR_DEPARTMENT_OTHER): Payer: Medicare Other | Admitting: Oncology

## 2012-01-18 VITALS — BP 153/79 | HR 80 | Temp 97.9°F | Ht 64.0 in | Wt 216.3 lb

## 2012-01-18 DIAGNOSIS — N390 Urinary tract infection, site not specified: Secondary | ICD-10-CM

## 2012-01-18 DIAGNOSIS — Z170 Estrogen receptor positive status [ER+]: Secondary | ICD-10-CM

## 2012-01-18 DIAGNOSIS — C50419 Malignant neoplasm of upper-outer quadrant of unspecified female breast: Secondary | ICD-10-CM | POA: Diagnosis not present

## 2012-01-18 DIAGNOSIS — R52 Pain, unspecified: Secondary | ICD-10-CM

## 2012-01-18 DIAGNOSIS — R350 Frequency of micturition: Secondary | ICD-10-CM | POA: Diagnosis not present

## 2012-01-18 DIAGNOSIS — C50919 Malignant neoplasm of unspecified site of unspecified female breast: Secondary | ICD-10-CM

## 2012-01-18 MED ORDER — SULFAMETHOXAZOLE-TRIMETHOPRIM 800-160 MG PO TABS: 1.0000 | ORAL_TABLET | Freq: Two times a day (BID) | ORAL | Status: AC

## 2012-01-18 MED ORDER — ANASTROZOLE 1 MG PO TABS: 1.0000 mg | ORAL_TABLET | Freq: Every day | ORAL | Status: AC

## 2012-01-18 NOTE — Progress Notes (Signed)
Hematology and Oncology Follow Up Visit  [name] [id number] Dec 13, 1945 66 y.o. 01/18/2012 4:22 PM PCP Gaspar Garbe Principle Diagnosis: T1bNo er+pr+ breast cancer, s/p lumpevtomy with completion of xrt 4/10, on femara 2. Hx of shingles affectig her eye.  Interim History:  There have been no intercurrent illness, hospitalizations or medication changes. Ongoing visual problems with evoluton of cataracts on rt, glaucoma; previous hx of shingles.Has been on multiple  Courses of valtrex( generic doesn't work). She ultimately underwent cataract surgery recently which is helped quite a bit. She is her glaucoma no eye is better. She has noticed that there is a lot more achiness that she's been experiencing over the past little while. Her right hip is also somewhat worse. She is trying to walk and lose weight but this is been difficult. She recently was on a red yeast rice which made her achiness a lot worse but she still has this despite not taking that medication. She is also having symptoms suggestive of UTI.  Medications: I have reviewed the patient's current medications.  Allergies:  Allergies  Allergen Reactions  . Adhesive (Tape) Dermatitis  . Cosopt (Dorzolamide Hcl-Timolol Mal) Other (See Comments)    Turns eye red, increases eye pressure  . Penicillins   . Tamoxifen Other (See Comments)    Vaginal bleeding  . Thimerosal     Makes pt eye red     Past Medical History, Surgical history, Social history, and Family History were reviewed and updated.  Review of Systems: Constitutional:  Negative for fever, chills, night sweats, anorexia, weight loss, pain. Cardiovascular: no chest pain or dyspnea on exertion Respiratory: no cough, shortness of breath, or wheezing Neurological: no TIA or stroke symptoms Dermatological: negative ENT: positive for - visual changes Skin Gastrointestinal: no abdominal pain, change in bowel habits, or black or bloody stools Genito-Urinary: no  dysuria, trouble voiding, or hematuria Hematological and Lymphatic: negative Breast: negative for breast lumps Musculoskeletal: negative Remaining ROS negative.  Physical Exam: Blood pressure 153/79, pulse 80, temperature 97.9 F (36.6 C), height 5\' 4"  (1.626 m), weight 216 lb 4.8 oz (98.113 kg). ECOG: 0 General appearance: alert, cooperative and appears stated age Head: Normocephalic, without obvious abnormality, atraumatic Neck: no adenopathy, no carotid bruit, no JVD, supple, symmetrical, trachea midline and thyroid not enlarged, symmetric, no tenderness/mass/nodules Lymph nodes: Cervical, supraclavicular, and axillary nodes normal. Cardiac : regular rate and rhythm, no murmurs or gallops Pulmonary:clear to auscultation bilaterally and normal percussion bilaterally Breasts: inspection negative, no nipple discharge or bleeding, no masses or nodularity palpable, frt nipple retracted -chronic, generally a lumpy breasts bilaterally Abdomen:soft, non-tender; bowel sounds normal; no masses,  no organomegaly Extremities negative Neuro: alert, oriented, normal speech, no focal findings or movement disorder noted  Lab Results: Lab Results  Component Value Date   WBC 6.3 01/07/2012   HGB 12.7 01/07/2012   HCT 37.5 01/07/2012   MCV 98.4 01/07/2012   PLT 216 01/07/2012     Chemistry      Component Value Date/Time   NA 141 01/07/2012 1245   K 4.3 01/07/2012 1245   CL 105 01/07/2012 1245   CO2 28 01/07/2012 1245   BUN 13 01/07/2012 1245   CREATININE 0.59 01/07/2012 1245      Component Value Date/Time   CALCIUM 9.4 01/07/2012 1245   ALKPHOS 60 01/07/2012 1245   AST 15 01/07/2012 1245   ALT 16 01/07/2012 1245   BILITOT 0.6 01/07/2012 1245      .pathology. Radiological Studies: chest X-ray  n/a Mammogram Recent 12/12-wnl Bone density 3/12- improved, minimal osteopenia  Impression and Plan: Node-negative ER/PR positive breast cancer, status post lumpectomy and radiation. Currently on  Femara. I discussed with her strategy to discontinue the Femara for 2 weeks. Her achiness improves then I would switch her to Arimidex. I have called a prescription in. If had her achiness persists it is likely not the Femara and therefore I would continue with that. As far as her UTI is concerned I started on Septra and we'll also gather a urine sample today. I will see her in 6 months time for followup mammogram. I also counseled her about weight loss given her current BMI.  More than 50% of the visit was spent in patient-related counselling   Pierce Crane, MD 6/25/20134:22 PM

## 2012-01-18 NOTE — Telephone Encounter (Signed)
Made patient appointment for mammogram at the breast center made patient appointment for lab one week before md appointment

## 2012-01-19 ENCOUNTER — Other Ambulatory Visit: Payer: Self-pay | Admitting: Oncology

## 2012-01-19 ENCOUNTER — Other Ambulatory Visit: Payer: Self-pay | Admitting: *Deleted

## 2012-01-19 DIAGNOSIS — IMO0001 Reserved for inherently not codable concepts without codable children: Secondary | ICD-10-CM

## 2012-01-19 LAB — URINALYSIS, MICROSCOPIC - CHCC
Bilirubin (Urine): NEGATIVE
Glucose: NEGATIVE g/dL
RBC count: NEGATIVE (ref 0–2)

## 2012-02-17 ENCOUNTER — Other Ambulatory Visit: Payer: Self-pay | Admitting: Oncology

## 2012-02-17 DIAGNOSIS — M818 Other osteoporosis without current pathological fracture: Secondary | ICD-10-CM

## 2012-03-09 ENCOUNTER — Ambulatory Visit (INDEPENDENT_AMBULATORY_CARE_PROVIDER_SITE_OTHER): Payer: Medicare Other | Admitting: Surgery

## 2012-03-30 DIAGNOSIS — B0051 Herpesviral iridocyclitis: Secondary | ICD-10-CM | POA: Diagnosis not present

## 2012-03-30 DIAGNOSIS — H33319 Horseshoe tear of retina without detachment, unspecified eye: Secondary | ICD-10-CM | POA: Diagnosis not present

## 2012-03-30 DIAGNOSIS — H4040X0 Glaucoma secondary to eye inflammation, unspecified eye, stage unspecified: Secondary | ICD-10-CM | POA: Diagnosis not present

## 2012-03-30 DIAGNOSIS — Z961 Presence of intraocular lens: Secondary | ICD-10-CM | POA: Diagnosis not present

## 2012-03-30 DIAGNOSIS — Z9849 Cataract extraction status, unspecified eye: Secondary | ICD-10-CM | POA: Diagnosis not present

## 2012-03-30 DIAGNOSIS — D3130 Benign neoplasm of unspecified choroid: Secondary | ICD-10-CM | POA: Diagnosis not present

## 2012-03-30 DIAGNOSIS — H409 Unspecified glaucoma: Secondary | ICD-10-CM | POA: Diagnosis not present

## 2012-03-30 DIAGNOSIS — Z9889 Other specified postprocedural states: Secondary | ICD-10-CM | POA: Diagnosis not present

## 2012-04-04 ENCOUNTER — Ambulatory Visit (INDEPENDENT_AMBULATORY_CARE_PROVIDER_SITE_OTHER): Payer: Medicare Other | Admitting: Surgery

## 2012-04-13 DIAGNOSIS — Z23 Encounter for immunization: Secondary | ICD-10-CM | POA: Diagnosis not present

## 2012-04-20 ENCOUNTER — Ambulatory Visit (INDEPENDENT_AMBULATORY_CARE_PROVIDER_SITE_OTHER): Payer: Medicare Other | Admitting: Surgery

## 2012-04-20 ENCOUNTER — Encounter (INDEPENDENT_AMBULATORY_CARE_PROVIDER_SITE_OTHER): Payer: Self-pay | Admitting: Surgery

## 2012-04-20 VITALS — BP 130/78 | HR 76 | Temp 97.6°F | Resp 12 | Ht 64.0 in | Wt 217.6 lb

## 2012-04-20 DIAGNOSIS — Z853 Personal history of malignant neoplasm of breast: Secondary | ICD-10-CM | POA: Diagnosis not present

## 2012-04-20 NOTE — Patient Instructions (Signed)
Return 1 year. 

## 2012-04-20 NOTE — Progress Notes (Signed)
Subjective:     Patient ID: [name], female   DOB: 1945-11-28, 66 y.o.   MRN: [id number]  HPI The patient returns today in followup for her stage I right breast cancer. She underwent a right breast lumpectomy with sentinel lymph node mapping in February of 2010 for a T1 N0 MX ER positive PR positive HER-2/neu negative right breast cancer. She underwent lumpectomy and subsequent post operative radiation therapy. She is being maintained on femara. She still has some mild discomfort in her right breast. She denies any nipple discharge or any new masses that she can feel. Has arthalgia.  Review of Systems  Constitutional: Negative.   HENT: Negative.   Eyes: Negative.   Respiratory: Negative.   Cardiovascular: Negative.   Gastrointestinal: Negative.   Genitourinary: Negative.   Neurological: Negative.   Hematological: Negative.   Psychiatric/Behavioral: Negative.        Objective:   Physical Exam  Constitutional: She appears well-developed and well-nourished.  HENT:  Head: Normocephalic and atraumatic.  Nose: Nose normal.  Eyes: Conjunctivae and EOM are normal. Pupils are equal, round, and reactive to light.  Neck: Normal range of motion. Neck supple.  Cardiovascular: Normal rate, regular rhythm and normal heart sounds.   Pulmonary/Chest: Effort normal and breath sounds normal.       Both breasts were examined today. Postsurgical changes noted right breast. Minimal volume loss to right breast. No suspicious masses in the right breast. Chronic inversion of right nipple noted. Right axilla is normal. Left breast is normal. The left axilla normal  Lymphadenopathy:    She has no cervical adenopathy.   Clinical Data: 66 year old female with history of right breast  cancer and lumpectomy in 2010. For annual bilateral mammograms.  DIGITAL DIAGNOSTIC BILATERAL MAMMOGRAM with CAD  Comparison: 06/26/2010, 06/23/2009 and prior mammograms dating back  to 06/25/2008  Findings: MLO and CC  views of both breasts and a spot tangential  view of the lumpectomy site performed.  Scarring in the upper outer right breast again identified.  There is no evidence of mass, nonsurgical distortion, or suspicious  calcifications.  The breast tissue is primarily fatty.  Mammographic images were processed with CAD.  IMPRESSION:  No specific mammographic evidence of breast malignancy.  Right breast scarring.  These findings were discussed with the patient. She was encouraged  to begin/continue monthly self exams and to contact her primary  physician if any changes noted.  BI-RADS CATEGORY 2: Benign finding(s).  Recommend bilateral diagnostic mammograms in 1 year.  Original Report Authenticated By: Rosendo Gros, M.D.     Assessment:     Stage I right breast cancer    Plan:     Her next mammogram is due in December 2013. Her mammogram from 2011 and was within normal limits. She will continue her followup with Dr. Donnie Coffin I will see her back.

## 2012-05-03 DIAGNOSIS — L819 Disorder of pigmentation, unspecified: Secondary | ICD-10-CM | POA: Diagnosis not present

## 2012-05-03 DIAGNOSIS — D485 Neoplasm of uncertain behavior of skin: Secondary | ICD-10-CM | POA: Diagnosis not present

## 2012-05-03 DIAGNOSIS — D235 Other benign neoplasm of skin of trunk: Secondary | ICD-10-CM | POA: Diagnosis not present

## 2012-07-05 ENCOUNTER — Other Ambulatory Visit: Payer: Self-pay | Admitting: *Deleted

## 2012-07-05 DIAGNOSIS — C50919 Malignant neoplasm of unspecified site of unspecified female breast: Secondary | ICD-10-CM

## 2012-07-05 MED ORDER — ANASTROZOLE 1 MG PO TABS: 1.0000 mg | ORAL_TABLET | Freq: Every day | ORAL | Status: DC

## 2012-07-14 DIAGNOSIS — B0051 Herpesviral iridocyclitis: Secondary | ICD-10-CM | POA: Diagnosis not present

## 2012-07-14 DIAGNOSIS — Z961 Presence of intraocular lens: Secondary | ICD-10-CM | POA: Diagnosis not present

## 2012-07-14 DIAGNOSIS — H40049 Steroid responder, unspecified eye: Secondary | ICD-10-CM | POA: Diagnosis not present

## 2012-07-14 DIAGNOSIS — Z9849 Cataract extraction status, unspecified eye: Secondary | ICD-10-CM | POA: Diagnosis not present

## 2012-07-18 ENCOUNTER — Telehealth: Payer: Self-pay | Admitting: *Deleted

## 2012-07-18 NOTE — Telephone Encounter (Signed)
left voice message to inform the patient of the cancelled appointment for 07-18-2012 and 07-28-2012

## 2012-07-20 ENCOUNTER — Ambulatory Visit
Admission: RE | Admit: 2012-07-20 | Discharge: 2012-07-20 | Disposition: A | Discharge status: Home / Self Care | Payer: Medicare Other | Source: Ambulatory Visit | Attending: Oncology | Admitting: Oncology

## 2012-07-20 DIAGNOSIS — C50919 Malignant neoplasm of unspecified site of unspecified female breast: Secondary | ICD-10-CM

## 2012-07-20 DIAGNOSIS — Z853 Personal history of malignant neoplasm of breast: Secondary | ICD-10-CM | POA: Diagnosis not present

## 2012-07-21 ENCOUNTER — Other Ambulatory Visit: Payer: Medicare Other | Admitting: Lab

## 2012-07-21 ENCOUNTER — Other Ambulatory Visit: Payer: Self-pay | Admitting: *Deleted

## 2012-07-21 ENCOUNTER — Other Ambulatory Visit (HOSPITAL_BASED_OUTPATIENT_CLINIC_OR_DEPARTMENT_OTHER): Payer: Medicare Other | Admitting: Lab

## 2012-07-21 ENCOUNTER — Ambulatory Visit: Payer: Medicare Other | Admitting: Oncology

## 2012-07-21 DIAGNOSIS — C50919 Malignant neoplasm of unspecified site of unspecified female breast: Secondary | ICD-10-CM | POA: Diagnosis not present

## 2012-07-21 DIAGNOSIS — C50419 Malignant neoplasm of upper-outer quadrant of unspecified female breast: Secondary | ICD-10-CM | POA: Diagnosis not present

## 2012-07-21 DIAGNOSIS — M818 Other osteoporosis without current pathological fracture: Secondary | ICD-10-CM

## 2012-07-21 LAB — CBC WITH DIFFERENTIAL/PLATELET
EOS%: 2.2 % (ref 0.0–7.0)
Eosinophils Absolute: 0.1 10*3/uL (ref 0.0–0.5)
LYMPH%: 30.9 % (ref 14.0–49.7)
MCH: 33.4 pg (ref 25.1–34.0)
MCHC: 34.9 g/dL (ref 31.5–36.0)
MCV: 95.7 fL (ref 79.5–101.0)
MONO%: 8.1 % (ref 0.0–14.0)
NEUT#: 3.9 10*3/uL (ref 1.5–6.5)
Platelets: 229 10*3/uL (ref 145–400)
RBC: 3.92 10*6/uL (ref 3.70–5.45)

## 2012-07-21 LAB — COMPREHENSIVE METABOLIC PANEL (CC13)
AST: 19 U/L (ref 5–34)
BUN: 12 mg/dL (ref 7.0–26.0)
Calcium: 9.9 mg/dL (ref 8.4–10.4)
Chloride: 102 mEq/L (ref 98–107)
Creatinine: 0.7 mg/dL (ref 0.6–1.1)
Total Bilirubin: 0.37 mg/dL (ref 0.20–1.20)

## 2012-07-21 MED ORDER — ANASTROZOLE 1 MG PO TABS: 1.0000 mg | ORAL_TABLET | Freq: Every day | ORAL | Status: DC

## 2012-07-21 MED ORDER — ALENDRONATE SODIUM 35 MG PO TABS: 35.0000 mg | ORAL_TABLET | ORAL | Status: DC

## 2012-07-22 LAB — CANCER ANTIGEN 27.29: CA 27.29: 24 U/mL (ref 0–39)

## 2012-07-24 ENCOUNTER — Telehealth: Payer: Self-pay | Admitting: Oncology

## 2012-07-24 NOTE — Telephone Encounter (Signed)
I talked to the patient on Friday in the waiting room, [name] was very upset that Dr. Donnie Coffin is leaving.   [name] wants to know if [name] will be seen by a medical oncologist. I relayed this information to our executive team.

## 2012-07-28 ENCOUNTER — Ambulatory Visit: Payer: Medicare Other | Admitting: Oncology

## 2012-08-07 ENCOUNTER — Telehealth: Payer: Self-pay | Admitting: Oncology

## 2012-08-07 NOTE — Telephone Encounter (Signed)
Called the patient to schedule her to be seen tomorrow.   She definitely wants to see Dr. Darnelle Catalan also.

## 2012-08-08 ENCOUNTER — Ambulatory Visit (HOSPITAL_BASED_OUTPATIENT_CLINIC_OR_DEPARTMENT_OTHER): Payer: Medicare Other | Admitting: Nurse Practitioner

## 2012-08-08 ENCOUNTER — Telehealth: Payer: Self-pay | Admitting: *Deleted

## 2012-08-08 ENCOUNTER — Encounter: Payer: Self-pay | Admitting: Nurse Practitioner

## 2012-08-08 VITALS — BP 161/81 | HR 80 | Temp 98.4°F | Resp 20 | Ht 64.0 in | Wt 219.6 lb

## 2012-08-08 DIAGNOSIS — Z170 Estrogen receptor positive status [ER+]: Secondary | ICD-10-CM

## 2012-08-08 DIAGNOSIS — M810 Age-related osteoporosis without current pathological fracture: Secondary | ICD-10-CM

## 2012-08-08 DIAGNOSIS — M949 Disorder of cartilage, unspecified: Secondary | ICD-10-CM

## 2012-08-08 DIAGNOSIS — M899 Disorder of bone, unspecified: Secondary | ICD-10-CM

## 2012-08-08 DIAGNOSIS — C50419 Malignant neoplasm of upper-outer quadrant of unspecified female breast: Secondary | ICD-10-CM | POA: Diagnosis not present

## 2012-08-08 NOTE — Progress Notes (Signed)
Hematology and Oncology Follow Up Visit  [name] [id number] [birth year] 67 y.o. 01/18/2012 4:22 PM PCP [name]   Principle Diagnosis: T1bN0  ER+ PR+ breast cancer, s/p lumpectomy with completion of xrt 4/10, currently on Anastrazole after debilitating aching/pain on Femara. 2. Hx of shingles affecting her eye.  Interim History:  [name] returns today for her regularly scheduled 6 month follow up of her T1bN0M0 breast cancer. She is doing much better on Anastrazole than she was on Femara. She continues to have some fatigue and states she has weakness in her legs because of lack of exercise. She did recently rejoin [employer] and looks forward to gaining strength in her legs. She struggles with insomnia, but this is a life long battle she has had and not a new symptom. 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 She continues to require ongoing courses of Valtrex to manager her shingles. Her cataracts were removed last year and have not recurred. She is doing well otherwise.   Medications: I have reviewed the patient's current medications. Current Outpatient Prescriptions on File Prior to Visit  Medication Sig Dispense Refill  . alendronate (FOSAMAX) 35 MG tablet Take 1 tablet (35 mg total) by mouth every 7 (seven) days. Take with a full glass of water on an empty stomach.  4 tablet  6  . anastrozole (ARIMIDEX) 1 MG tablet Take 1 tablet (1 mg total) by mouth daily.  30 tablet  3  . b complex vitamins tablet Take 1 tablet by mouth daily.        . calcium carbonate (OS-CAL) 600 MG TABS Take 600 mg by mouth 2 (two) times daily with a meal.        . Cholecalciferol (VITAMIN D3) 2000 UNITS CHEW Chew by mouth daily.        Marland Kitchen loteprednol (LOTEMAX) 0.5 % ophthalmic suspension Place 1 drop into both eyes 4 (four) times daily.        . Multiple Vitamin (MULTIVITAMIN) capsule Take 1 capsule by mouth daily.        . timolol (BETIMOL) 0.5  % ophthalmic solution 1 drop 2 (two) times daily.        . traMADol (ULTRAM) 50 MG tablet Ad lib.      . valACYclovir (VALTREX) 1000 MG tablet Take 1,000 mg by mouth daily.       Marland Kitchen omeprazole (PRILOSEC) 20 MG capsule Take 20 mg by mouth daily.          Allergies:  Allergies  Allergen Reactions  . Adhesive (Tape) Dermatitis  . Cosopt (Dorzolamide Hcl-Timolol Mal) Other (See Comments)    Turns eye red, increases eye pressure  . Penicillins   . Tamoxifen Other (See Comments)    Vaginal bleeding  . Thimerosal     Makes pt eye red     Past Medical History, Surgical history, Social history, and Family History were reviewed and updated.  Review of Systems: Constitutional:  Negative for fever, chills, night sweats, anorexia, weight loss, pain. Positive for insomnia Cardiovascular: no chest pain or dyspnea on exertion Respiratory: no cough, shortness of breath, or wheezing Dermatological: negative ENT: ongoing issues with shingles in her right eye but denies blurred vision, loss of balance, dizziness or headaches Skin: no new rashes Gastrointestinal: no abdominal pain, change in bowel habits, or black or bloody stools Genito-Urinary: no dysuria, trouble voiding, or hematuria Hematological and Lymphatic: negative Breast: negative for  breast lumps Musculoskeletal: states she feels like her legs are weak - that she has to push herself up to get out of a chair or sitting position Remaining ROS negative.  Physical Exam: Blood pressure 153/79, pulse 80, temperature 97.9 F (36.6 C), height 5\' 4"  (1.626 m), weight 216 lb 4.8 oz (98.113 kg). ECOG: 0 General appearance: alert, cooperative and appears stated age Head: right eye swollen, watering, reddened, EOMI Neck: no adenopathy, no carotid bruit, no JVD, supple, symmetrical, trachea midline and thyroid not enlarged, symmetric, no tenderness/mass/nodules Lymph nodes: Cervical, supraclavicular, axillary, and inguinal nodes non  palpable Cardiac : regular rate and rhythm, no murmurs or gallops Pulmonary:clear to auscultation bilaterally and normal percussion bilaterally Breasts: inspection negative, no nipple discharge or bleeding, no masses or nodularity palpable, rt nipple retracted -chronic, generally a lumpy breasts bilaterally Abdomen:soft, non-tender; bowel sounds normal; no masses,  no organomegaly Extremities negative Neuro: alert, oriented, normal speech, no focal findings or movement disorder noted  Radiology:  Last mammogram 07/20/2012 was unremarkable  Labs:  Patient had CBC and CMP drawn in December - they were reviewed and will be repeated at her next office visit in 6 months. Results for Katherine, [name] (MRN [id number]) as of 08/08/2012 14:42  Ref. Range 07/21/2012 15:36  Sodium Latest Range: 136-145 mEq/L 140  Potassium Latest Range: 3.5-5.1 mEq/L 3.9  Chloride Latest Range: 96-112 mEq/L 102  CO2 Latest Range: 19-32 mEq/L 26  BUN Latest Range: 7.0-26.0 mg/dL 40.9  Creatinine Latest Range: 0.50-1.10 mg/dL 0.7  Calcium Latest Range: 8.4-10.5 mg/dL 9.9  Glucose Latest Range: 70-99 mg/dl 811 (H)  Alkaline Phosphatase Latest Range: 39-117 U/L 65  Albumin Latest Range: 3.5-5.2 g/dL 3.8  AST Latest Range: 0-37 U/L 19  ALT Latest Range: 0-35 U/L 14  Total Protein Latest Range: 6.4-8.3 g/dL 7.4  Total Bilirubin Latest Range: 0.3-1.2 mg/dL 9.14   Results for Katherine, [name] (MRN [id number]) as of 08/08/2012 14:42  Ref. Range 07/21/2012 15:36  WBC Latest Range: 4.0-10.5 K/uL 6.6  RBC Latest Range: 3.87-5.11 MIL/uL 3.92  Hemoglobin Latest Range: 12.0-15.0 g/dL 08.6  HCT Latest Range: 36.0-46.0 % 37.5  MCV Latest Range: 78.0-100.0 fL 95.7  MCH Latest Range: 25.1-34.0 pg 33.4  MCHC Latest Range: 30.0-36.0 g/dL 57.8  RDW Latest Range: 11.5-15.5 % 13.2  Platelets Latest Range: 150-400 K/uL 229  NEUT% Latest Range: 38.4-76.8 % 58.2  LYMPH% Latest Range: 14.0-49.7 % 30.9  MONO% Latest Range: 0.0-14.0 % 8.1   EOS% Latest Range: 0.0-7.0 % 2.2  BASO% Latest Range: 0.0-2.0 % 0.6  NEUT# Latest Range: 1.7-7.7 K/uL 3.9  MONO# Latest Range: 0.1-0.9 10e3/uL 0.5  Eosinophils Absolute Latest Range: 0.0-0.5 10e3/uL 0.1  Basophils Absolute Latest Range: 0.0-0.1 K/uL 0.0  lymph# Latest Range: 0.9-3.3 10e3/uL 2.0     Impression and Plan: Node-negative ER/PR positive breast cancer, status post lumpectomy and radiation. Doing well on Anastrazole. She did meet Dr. Darnelle Catalan today, and will follow up with him in 6 months. Last mammogram was unremarkable Osteopenia - due for Dexa Bone Density scan in March of this year - we will schedule. She will continue on Fosamex More than 50% of the visit was spent in patient-related counselling   Bobbe Medico, AOCNP, NP-C

## 2012-08-08 NOTE — Telephone Encounter (Signed)
Made patient appointment for bone density 09-2012 [facility]  Made patient for md in 01-2013

## 2012-09-04 DIAGNOSIS — R1310 Dysphagia, unspecified: Secondary | ICD-10-CM | POA: Diagnosis not present

## 2012-09-09 ENCOUNTER — Other Ambulatory Visit: Payer: Self-pay

## 2012-09-18 ENCOUNTER — Telehealth: Payer: Self-pay | Admitting: Oncology

## 2012-09-18 NOTE — Telephone Encounter (Signed)
The patient called stating she felt put off during her follow up visit.   She came in knowing she would see a NP or PA and then meet Dr. Darnelle Catalan.  She said that Dr. Darnelle Catalan came in but she did not feel like he really knew her case- but was very nice.   She said this was not her treatment before Dr. Donnie Coffin left in fact she said she could not have received better care anywhere than what she had until as of late.  She feels like cattle being run through.   I listened, acknowledged and empathized.  She has spoken to our VP of Oncology in the past- and she said she might write him a letter.   She is having a dexa scan in a few weeks and I assured her I would follow up with her with the results.

## 2012-10-06 ENCOUNTER — Ambulatory Visit
Admission: RE | Admit: 2012-10-06 | Discharge: 2012-10-06 | Disposition: A | Discharge status: Home / Self Care | Payer: Medicare Other | Source: Ambulatory Visit | Attending: Nurse Practitioner | Admitting: Nurse Practitioner

## 2012-10-06 DIAGNOSIS — M810 Age-related osteoporosis without current pathological fracture: Secondary | ICD-10-CM

## 2012-10-06 DIAGNOSIS — M899 Disorder of bone, unspecified: Secondary | ICD-10-CM | POA: Diagnosis not present

## 2012-10-11 ENCOUNTER — Encounter: Payer: Self-pay | Admitting: Internal Medicine

## 2012-10-11 ENCOUNTER — Ambulatory Visit (INDEPENDENT_AMBULATORY_CARE_PROVIDER_SITE_OTHER): Payer: Medicare Other | Admitting: Internal Medicine

## 2012-10-11 VITALS — BP 146/86 | HR 79 | Temp 99.9°F | Resp 16 | Ht 64.0 in | Wt 219.6 lb

## 2012-10-11 DIAGNOSIS — D179 Benign lipomatous neoplasm, unspecified: Secondary | ICD-10-CM

## 2012-10-11 DIAGNOSIS — J069 Acute upper respiratory infection, unspecified: Secondary | ICD-10-CM | POA: Insufficient documentation

## 2012-10-11 DIAGNOSIS — H6120 Impacted cerumen, unspecified ear: Secondary | ICD-10-CM

## 2012-10-11 DIAGNOSIS — H6121 Impacted cerumen, right ear: Secondary | ICD-10-CM

## 2012-10-11 MED ORDER — GUAIFENESIN ER 600 MG PO TB12: 600.0000 mg | ORAL_TABLET | Freq: Two times a day (BID) | ORAL | Status: AC

## 2012-10-11 MED ORDER — AZITHROMYCIN 250 MG PO TABS: ORAL_TABLET | ORAL | Status: DC

## 2012-10-11 NOTE — Patient Instructions (Signed)
To complete course of antibiotics and to notify if you develop further fever or worsening of symptoms

## 2012-10-11 NOTE — Progress Notes (Signed)
  Subjective:    Patient ID: [name], female    DOB: 1946/05/26, 67 y.o.   MRN: [id number]  HPI She started feeling cold on Sunday and started with cough on Monday. She then started having muscle aches and in the evening developed fever with chills, temp of 100.4. The cough is mostly dry and she then started having runny nose and sneezing. This morning started having discomfort in her throat. She has been feeling stuffed up in her nose. She now also had headache and earache. Denies any ear discharge. Appetite has decreased and energy level is low.   Review of Systems  Constitutional: Positive for fever, chills and appetite change.       Decreased appetite  HENT: Positive for ear pain and congestion. Negative for rhinorrhea.   Respiratory: Positive for cough. Negative for chest tightness.   Cardiovascular: Negative for chest pain.  Gastrointestinal: Negative for diarrhea.  Musculoskeletal: Positive for myalgias.  Skin: Negative for rash.  Neurological: Positive for headaches.       Objective:   Physical Exam  Constitutional: She is oriented to person, place, and time. She appears well-developed.  HENT:  Head: Normocephalic and atraumatic.  Eyes: Conjunctivae are normal. Pupils are equal, round, and reactive to light. Right eye exhibits no discharge. Left eye exhibits no discharge.  Neck: Normal range of motion.  Right, single, enlarged and tender, mobile  Cardiovascular: Normal rate and regular rhythm.   Pulmonary/Chest: Effort normal and breath sounds normal. No respiratory distress. She has no wheezes. She has no rales. She exhibits no tenderness.  Abdominal: Soft.  Musculoskeletal: Normal range of motion.  Has  2 x3 cm mobile fat tissue over the right wrist joint on ulnar end, non tender, no inflammatory signs  Lymphadenopathy:    She has cervical adenopathy.  Neurological: She is alert and oriented to person, place, and time.  Skin: Skin is warm and dry.  Psychiatric:  She has a normal mood and affect.          Assessment & Plan:  Acute upper respiratory infections of unspecified site - With erythema of oropharynx and enlarged lymph node with uri symptoms are present. Will have her on azithromycin for 5 days. Will also have her on mucinex twice a day for a week and losenges for throat discomfort. To notify if develop hig grade temperature or worsening of symptoms  Lipoma of joint  Will monitor for now. No signs of infection and not affecting her quality of life.  Cerumen right ear- will get right ear lavage done

## 2012-10-27 DIAGNOSIS — B0051 Herpesviral iridocyclitis: Secondary | ICD-10-CM | POA: Diagnosis not present

## 2012-10-27 DIAGNOSIS — D3130 Benign neoplasm of unspecified choroid: Secondary | ICD-10-CM | POA: Diagnosis not present

## 2012-10-27 DIAGNOSIS — H33319 Horseshoe tear of retina without detachment, unspecified eye: Secondary | ICD-10-CM | POA: Diagnosis not present

## 2012-10-27 DIAGNOSIS — D3131 Benign neoplasm of right choroid: Secondary | ICD-10-CM | POA: Insufficient documentation

## 2012-10-27 DIAGNOSIS — Z8669 Personal history of other diseases of the nervous system and sense organs: Secondary | ICD-10-CM | POA: Diagnosis not present

## 2012-10-27 DIAGNOSIS — H40049 Steroid responder, unspecified eye: Secondary | ICD-10-CM | POA: Diagnosis not present

## 2012-10-27 DIAGNOSIS — Z961 Presence of intraocular lens: Secondary | ICD-10-CM | POA: Diagnosis not present

## 2012-11-01 DIAGNOSIS — R1310 Dysphagia, unspecified: Secondary | ICD-10-CM | POA: Diagnosis not present

## 2012-11-01 DIAGNOSIS — K222 Esophageal obstruction: Secondary | ICD-10-CM | POA: Diagnosis not present

## 2012-11-03 ENCOUNTER — Telehealth: Payer: Self-pay | Admitting: Oncology

## 2012-11-03 NOTE — Telephone Encounter (Signed)
I called the patient about her bone density results.   I explained in one are it has improved, and the other area it declined but between the two- Dr. Darnelle Catalan states this is stable.    She is taking fosamax and Dr. Darnelle Catalan wants her to continue.  She is scheduled to return in July for a follow up.

## 2012-11-21 ENCOUNTER — Encounter: Payer: Self-pay | Admitting: Internal Medicine

## 2012-12-06 ENCOUNTER — Other Ambulatory Visit: Payer: Self-pay | Admitting: Oncology

## 2012-12-06 DIAGNOSIS — C50919 Malignant neoplasm of unspecified site of unspecified female breast: Secondary | ICD-10-CM

## 2013-01-18 ENCOUNTER — Encounter: Payer: Self-pay | Admitting: *Deleted

## 2013-01-24 ENCOUNTER — Ambulatory Visit (INDEPENDENT_AMBULATORY_CARE_PROVIDER_SITE_OTHER): Payer: Medicare Other | Admitting: Internal Medicine

## 2013-01-24 ENCOUNTER — Encounter: Payer: Self-pay | Admitting: Internal Medicine

## 2013-01-24 VITALS — BP 150/92 | HR 72 | Temp 98.0°F | Resp 18 | Ht 64.0 in | Wt 223.0 lb

## 2013-01-24 DIAGNOSIS — C50511 Malignant neoplasm of lower-outer quadrant of right female breast: Secondary | ICD-10-CM

## 2013-01-24 DIAGNOSIS — M67431 Ganglion, right wrist: Secondary | ICD-10-CM

## 2013-01-24 DIAGNOSIS — K2210 Ulcer of esophagus without bleeding: Secondary | ICD-10-CM | POA: Diagnosis not present

## 2013-01-24 DIAGNOSIS — M6740 Ganglion, unspecified site: Secondary | ICD-10-CM

## 2013-01-24 DIAGNOSIS — C50519 Malignant neoplasm of lower-outer quadrant of unspecified female breast: Secondary | ICD-10-CM

## 2013-01-24 DIAGNOSIS — K222 Esophageal obstruction: Secondary | ICD-10-CM | POA: Diagnosis not present

## 2013-01-24 DIAGNOSIS — M5430 Sciatica, unspecified side: Secondary | ICD-10-CM

## 2013-01-24 DIAGNOSIS — M67439 Ganglion, unspecified wrist: Secondary | ICD-10-CM | POA: Insufficient documentation

## 2013-01-24 DIAGNOSIS — M5431 Sciatica, right side: Secondary | ICD-10-CM

## 2013-01-24 MED ORDER — HYDROCODONE-ACETAMINOPHEN 10-325 MG PO TABS: 1.0000 | ORAL_TABLET | Freq: Three times a day (TID) | ORAL | Status: DC | PRN

## 2013-01-24 NOTE — Progress Notes (Signed)
Subjective:    Patient ID: [name], female    DOB: 19-Dec-1945, 67 y.o.   MRN: [id number]  HPI Right sciatic nerve pain in the SI area, upper lateral and anterior thigh. Goes down into the right 3rd toe. Had problems through the winter. She slept on an air mattress which seemed to help. Tramadol does not help. Advil helps. May have been aggravated by picking up stepping stones when mowing.  Had esophageal dilation 02/19/2009 and Sep 05, 2012.  Esophageal ulcer in 2010. No current dysphagia. Done by Dr. Evette Cristal.  Colonoscopy in 2010.  History of shingles in the right temple and eye. She says she has relapsed on 2 occassions with inflammation of her eye. She is asking if she should get the zoster vaccine.  Sciatica of right side - Plan: DISCONTINUED: HYDROcodone-acetaminophen (NORCO) 10-325 MG per tablet  Ganglion cyst of wrist, right  Esophageal ulcer  Esophageal stenosis  Breast cancer of lower-outer quadrant of right female breast - Plan: CBC With differential/Platelet, Comprehensive metabolic panel  Current Outpatient Prescriptions on File Prior to Visit  Medication Sig Dispense Refill  . alendronate (FOSAMAX) 35 MG tablet Take 1 tablet (35 mg total) by mouth every 7 (seven) days. Take with a full glass of water on an empty stomach.  4 tablet  6  . anastrozole (ARIMIDEX) 1 MG tablet TAKE ONE TABLET BY MOUTH EVERY DAY  30 tablet  2  . b complex vitamins tablet Take 1 tablet by mouth daily.        . calcium carbonate (OS-CAL) 600 MG TABS Take 600 mg by mouth 2 (two) times daily with a meal. Take 1 tablet daily for bone health.      . Cholecalciferol (VITAMIN D3) 2000 UNITS CHEW Chew 2,000 Units by mouth daily. Take 1 tablet daily for vitamin D supplement.      . Multiple Vitamin (MULTIVITAMIN) capsule Take 1 capsule by mouth daily.        . Omega-3 Fatty Acids (FISH OIL) 1000 MG CAPS Take one tablet once daily to lower cholesterol      . omeprazole (PRILOSEC) 20 MG capsule Take one  tablet twice daily for reflux      . timolol (BETIMOL) 0.5 % ophthalmic solution 1 drop 2 (two) times daily.        . valACYclovir (VALTREX) 1000 MG tablet Take 1,000 mg by mouth daily.       . vitamin C (ASCORBIC ACID) 500 MG tablet Take one tablet once daily for supplement       No current facility-administered medications on file prior to visit.    Review of Systems  Constitutional: Positive for appetite change. Negative for fever and chills.       Decreased appetite  HENT: Positive for dental problem. Negative for ear pain, congestion and rhinorrhea.   Eyes:       History of glaucoma. History of cataracts. History of a torn retina in the right eye 03/06/2010 associated with a dense vitreous hemorrhage. She had laser therapy and cryotherapy to the right. History of herpes zoster of the right. Takes Valtrex continuously.  Respiratory: Negative for cough and chest tightness.   Cardiovascular: Negative for chest pain, palpitations and leg swelling.  Gastrointestinal: Negative for diarrhea.       History mild dysphagia. Food seems to hang in the chest.  Genitourinary: Negative for dysuria, urgency, frequency, hematuria, flank pain, enuresis and genital sores.       History of ovarian cysts  CT scan November 2002.  Musculoskeletal: Positive for myalgias.       Chronic back pains. Pain down the right leg suggestive of sciatica. Mild arthralgias in her hips and knees. Stable balance and gait.  Skin: Negative for pallor, rash and wound.  Neurological: Negative for headaches.  Hematological: Negative.   Psychiatric/Behavioral: Negative.  Negative for behavioral problems, confusion, sleep disturbance and agitation.       Objective:BP 150/92  Pulse 72  Temp(Src) 98 F (36.7 C) (Oral)  Resp 18  Ht 5\' 4"  (1.626 m)  Wt 223 lb (101.152 kg)  BMI 38.26 kg/m2  SpO2 96%    Physical Exam  Constitutional: She is oriented to person, place, and time. She appears well-developed and well-nourished.  No distress.  Moderately overweight.  HENT:  Head: Normocephalic and atraumatic.  Right Ear: External ear normal.  Left Ear: External ear normal.  Nose: Nose normal.  Mouth/Throat: Oropharynx is clear and moist.  Dentures.  Eyes: Conjunctivae and EOM are normal. Pupils are equal, round, and reactive to light.  Neck: Neck supple. No JVD present. No tracheal deviation present. No thyromegaly present.  Cardiovascular: Normal rate, normal heart sounds and intact distal pulses.  Exam reveals no gallop and no friction rub.   No murmur heard. Pulmonary/Chest: Breath sounds normal. No respiratory distress. She has no wheezes. She has no rales.  Abdominal: Bowel sounds are normal. She exhibits no distension and no mass. There is no tenderness.  Musculoskeletal: Normal range of motion. She exhibits no edema and no tenderness.  Ganglion cyst at the dorsum of the right wrist.  Lymphadenopathy:    She has no cervical adenopathy.  Neurological: She is alert and oriented to person, place, and time. She has normal reflexes.  Skin: No rash noted. No erythema. No pallor.  Psychiatric: She has a normal mood and affect. Her behavior is normal. Judgment and thought content normal.     No visits with results within 3 Month(s) from this visit. Latest known visit with results is:  Appointment on 07/21/2012  Component Date Value Range Status  . WBC 07/21/2012 6.6  3.9 - 10.3 10e3/uL Final  . NEUT# 07/21/2012 3.9  1.5 - 6.5 10e3/uL Final  . HGB 07/21/2012 13.1  11.6 - 15.9 g/dL Final  . HCT 16/04/9603 37.5  34.8 - 46.6 % Final  . Platelets 07/21/2012 229  145 - 400 10e3/uL Final  . MCV 07/21/2012 95.7  79.5 - 101.0 fL Final  . MCH 07/21/2012 33.4  25.1 - 34.0 pg Final  . MCHC 07/21/2012 34.9  31.5 - 36.0 g/dL Final  . RBC 54/03/8118 3.92  3.70 - 5.45 10e6/uL Final  . RDW 07/21/2012 13.2  11.2 - 14.5 % Final  . lymph# 07/21/2012 2.0  0.9 - 3.3 10e3/uL Final  . MONO# 07/21/2012 0.5  0.1 - 0.9 10e3/uL  Final  . Eosinophils Absolute 07/21/2012 0.1  0.0 - 0.5 10e3/uL Final  . Basophils Absolute 07/21/2012 0.0  0.0 - 0.1 10e3/uL Final  . NEUT% 07/21/2012 58.2  38.4 - 76.8 % Final  . LYMPH% 07/21/2012 30.9  14.0 - 49.7 % Final  . MONO% 07/21/2012 8.1  0.0 - 14.0 % Final  . EOS% 07/21/2012 2.2  0.0 - 7.0 % Final  . BASO% 07/21/2012 0.6  0.0 - 2.0 % Final  . CA 27.29 07/21/2012 24  0 - 39 U/mL Final  . LDH 07/21/2012 178  125 - 245 U/L Final   06/01/12 - NOTE new reference range.  Marland Kitchen  Sodium 07/21/2012 140  136 - 145 mEq/L Final  . Potassium 07/21/2012 3.9  3.5 - 5.1 mEq/L Final  . Chloride 07/21/2012 102  98 - 107 mEq/L Final  . CO2 07/21/2012 26  22 - 29 mEq/L Final  . Glucose 07/21/2012 100* 70 - 99 mg/dl Final  . BUN 40/98/1191 12.0  7.0 - 26.0 mg/dL Final  . Creatinine 47/82/9562 0.7  0.6 - 1.1 mg/dL Final  . Total Bilirubin 07/21/2012 0.37  0.20 - 1.20 mg/dL Final  . Alkaline Phosphatase 07/21/2012 65  40 - 150 U/L Final  . AST 07/21/2012 19  5 - 34 U/L Final  . ALT 07/21/2012 14  0 - 55 U/L Final  . Total Protein 07/21/2012 7.4  6.4 - 8.3 g/dL Final  . Albumin 13/02/6577 3.8  3.5 - 5.0 g/dL Final  . Calcium 46/96/2952 9.9  8.4 - 10.4 mg/dL Final        Assessment & Plan:  Sciatica of right side: Continue Vicodin 10/325 when necessaryGanglion cyst of wrist, right  Esophageal ulcer: Asymptomatic except for mild dysphagia  Esophageal stenosis: Mild dysphagia  Breast cancer of lower-outer quadrant of right female breast: Has not relapsed   Plan: CBC With differential/Platelet, Comprehensive metabolic panel

## 2013-01-24 NOTE — Patient Instructions (Addendum)
Continue current medication.  I think getting the Zostavax vaccine is reasonable. Use hydrocodone with caution.

## 2013-02-02 DIAGNOSIS — H4040X0 Glaucoma secondary to eye inflammation, unspecified eye, stage unspecified: Secondary | ICD-10-CM | POA: Diagnosis not present

## 2013-02-02 DIAGNOSIS — B0051 Herpesviral iridocyclitis: Secondary | ICD-10-CM | POA: Diagnosis not present

## 2013-02-02 DIAGNOSIS — D3130 Benign neoplasm of unspecified choroid: Secondary | ICD-10-CM | POA: Diagnosis not present

## 2013-02-02 DIAGNOSIS — Z961 Presence of intraocular lens: Secondary | ICD-10-CM | POA: Diagnosis not present

## 2013-02-02 DIAGNOSIS — Z9889 Other specified postprocedural states: Secondary | ICD-10-CM | POA: Diagnosis not present

## 2013-02-02 DIAGNOSIS — H409 Unspecified glaucoma: Secondary | ICD-10-CM | POA: Diagnosis not present

## 2013-02-08 ENCOUNTER — Other Ambulatory Visit: Payer: Medicare Other | Admitting: Lab

## 2013-02-08 ENCOUNTER — Telehealth: Payer: Self-pay | Admitting: *Deleted

## 2013-02-08 ENCOUNTER — Ambulatory Visit (HOSPITAL_BASED_OUTPATIENT_CLINIC_OR_DEPARTMENT_OTHER): Payer: Medicare Other | Admitting: Lab

## 2013-02-08 ENCOUNTER — Other Ambulatory Visit: Payer: Self-pay | Admitting: *Deleted

## 2013-02-08 ENCOUNTER — Ambulatory Visit (HOSPITAL_BASED_OUTPATIENT_CLINIC_OR_DEPARTMENT_OTHER): Payer: Medicare Other | Admitting: Oncology

## 2013-02-08 VITALS — BP 153/83 | HR 76 | Temp 98.4°F | Resp 20 | Ht 64.0 in | Wt 225.4 lb

## 2013-02-08 DIAGNOSIS — C50519 Malignant neoplasm of lower-outer quadrant of unspecified female breast: Secondary | ICD-10-CM | POA: Diagnosis not present

## 2013-02-08 DIAGNOSIS — C50511 Malignant neoplasm of lower-outer quadrant of right female breast: Secondary | ICD-10-CM

## 2013-02-08 DIAGNOSIS — E559 Vitamin D deficiency, unspecified: Secondary | ICD-10-CM | POA: Insufficient documentation

## 2013-02-08 LAB — CBC WITH DIFFERENTIAL/PLATELET
BASO%: 0.7 % (ref 0.0–2.0)
MCHC: 33.9 g/dL (ref 31.5–36.0)
MONO#: 0.4 10*3/uL (ref 0.1–0.9)
RBC: 3.99 10*6/uL (ref 3.70–5.45)
RDW: 12.8 % (ref 11.2–14.5)
WBC: 5.9 10*3/uL (ref 3.9–10.3)
lymph#: 1.4 10*3/uL (ref 0.9–3.3)

## 2013-02-08 LAB — COMPREHENSIVE METABOLIC PANEL (CC13)
ALT: 19 U/L (ref 0–55)
CO2: 27 mEq/L (ref 22–29)
Calcium: 10.1 mg/dL (ref 8.4–10.4)
Chloride: 104 mEq/L (ref 98–109)
Glucose: 105 mg/dl (ref 70–140)
Sodium: 141 mEq/L (ref 136–145)
Total Protein: 7.5 g/dL (ref 6.4–8.3)

## 2013-02-08 NOTE — Telephone Encounter (Signed)
appts made and printed...td 

## 2013-02-08 NOTE — Progress Notes (Signed)
ID: [name] OB: September 09, 1957  MR#: [id number]  CSN#:[id number]  PCP: Kimber Relic, MD GYN:  Tracey Harries SU: Thomas Cornett OTHER MD: Herbert Moors, Dorothy Puffer   HISTORY OF PRESENT ILLNESS: From Dr. Doreatha Massed intake note to 20 11/12/2008:  "Ms. Wire undergoes annual screening mammography admits to a number of years because of an intercurrent illness with herpetic ophthalmitis.  She subsequently had a screening mammogram on 06/25/2008.  This was compared to a previous study.  This showed a possible mass in the right breast.  Additional views were recommended.  A diagnostic mammogram of the right breast and right breast ultrasound showed a 5 mm spiculated mass 9.30 position 7 cm from the right nipple.  This was felt to be suspicious and was biopsied on 07/16/2008.  Bilateral breast MRI scans were done preoperatively on 07/24/2008.  This showed an area of linear enhancement consistent with the biopsy tract measuring 5.2 x 1.5 x 1.2 cm.  There was a biopsy marker clip artifact as well.  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  This was ER and PR positive at 97% and 53% respectively, proliferative index was 5%, and the HER-2 ratio was 1.13.  The patient underwent a lumpectomy and sentinel lymph node evaluation on 08/29/2008.  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  Two sentinel lymph nodes were identified both of which were negative for malignancy.  The patient has had an unremarkable postoperative course."  Her subsequent history is as detailed below.  INTERVAL HISTORY: Deziray returns today for followup of her breast cancer. She is establishing herself in my service today.  REVIEW OF SYSTEMS: She is tolerating the anastrozole with rare hot flashes. She tells me vaginal dryness are not a major issue. She has right sciatica pain, but not the arthralgias/myalgias that can be experienced with aromatase  inhibitors. She has problems with esophageal strictures which Dr. Santa Lighter and takes care of. She tolerates the Fosamax without any GERD symptoms. She has numbness in some toes but not all, she walks for exercise "when I think of it". Overall a detailed review of systems today was stable  PAST MEDICAL HISTORY: Past Medical History  Diagnosis Date  . Hiatal hernia   . Diverticulosis   . Cancer     breast   . Shingles     in eyes  . GERD (gastroesophageal reflux disease)   . Hyperlipidemia   . Other abnormal blood chemistry   . Pain in joint, pelvic region and thigh   . Trigger finger (acquired)   . Dysuria   . Asymptomatic varicose veins   . Unspecified cataract   . Malignant neoplasm of breast (female), unspecified site   . Stricture and stenosis of esophagus   . Unspecified glaucoma(365.9)   . Migraine without aura, without mention of intractable migraine without mention of status migrainosus     PAST SURGICAL HISTORY: Past Surgical History  Procedure Laterality Date  . Esophageal dilation    . Appendectomy  1973    Dr. Wilber Bihari  . Breast surgery Right 08/29/2008    Dr. Maisie Fus Cornett    FAMILY HISTORY Family History  Problem Relation Age of Onset  . Hypertension Mother   . Hyperlipidemia Mother   . Alzheimer's disease Mother   . Emphysema Father   . COPD Father    the patient's father died from complications of COPD at age 56. The  patient's mother died at age 73 with Alzheimer's. The patient had one brother, no sisters. There is no history of breast or ovarian cancer in the family.  GYNECOLOGIC HISTORY:  Menarche age 30. She is GX P1, with first live birth age 35. She went through menopause at age 37, took hormone replacement for approximately 2 years.  SOCIAL HISTORY:  She runs an Haematologist." She was divorced and subsequently her ex-husband died. She lives alone with a Associate Professor. Her son Georga Hacking also works in [employer] and for [employer].  The patient has no brain children. She attends a [employer]    ADVANCED DIRECTIVES: Not in place   HEALTH MAINTENANCE: History  Substance Use Topics  . Smoking status: Never Smoker   . Smokeless tobacco: Not on file  . Alcohol Use: Yes     Colonoscopy:  PAP:  Bone density:  Lipid panel:  Allergies  Allergen Reactions  . Adhesive (Tape) Dermatitis  . Cosopt (Dorzolamide Hcl-Timolol Mal) Other (See Comments)    Turns eye red, increases eye pressure  . Penicillins   . Tamoxifen Other (See Comments)    Vaginal bleeding  . Thimerosal     Makes pt eye red     Current Outpatient Prescriptions  Medication Sig Dispense Refill  . alendronate (FOSAMAX) 35 MG tablet Take 1 tablet (35 mg total) by mouth every 7 (seven) days. Take with a full glass of water on an empty stomach.  4 tablet  6  . anastrozole (ARIMIDEX) 1 MG tablet TAKE ONE TABLET BY MOUTH EVERY DAY  30 tablet  2  . b complex vitamins tablet Take 1 tablet by mouth daily.        . calcium carbonate (OS-CAL) 600 MG TABS Take 600 mg by mouth 2 (two) times daily with a meal. Take 1 tablet daily for bone health.      . Cholecalciferol (VITAMIN D3) 2000 UNITS CHEW Chew 2,000 Units by mouth daily. Take 1 tablet daily for vitamin D supplement.      . fluorometholone (FML) 0.1 % ophthalmic suspension       . HYDROcodone-acetaminophen (NORCO) 10-325 MG per tablet Take 1 tablet by mouth every 8 (eight) hours as needed for pain.  30 tablet  0  . Multiple Vitamin (MULTIVITAMIN) capsule Take 1 capsule by mouth daily.        . Omega-3 Fatty Acids (FISH OIL) 1000 MG CAPS Take one tablet once daily to lower cholesterol      . omeprazole (PRILOSEC) 20 MG capsule Take one tablet twice daily for reflux      . timolol (BETIMOL) 0.5 % ophthalmic solution 1 drop 2 (two) times daily.        . traMADol (ULTRAM) 50 MG tablet Take 50 mg by mouth every 6 (six) hours as needed for pain. Take 1 tablet up to 4 times daily as needed to control  pain.      . valACYclovir (VALTREX) 1000 MG tablet Take 1,000 mg by mouth daily.       . vitamin C (ASCORBIC ACID) 500 MG tablet Take one tablet once daily for supplement      . zolpidem (AMBIEN) 5 MG tablet Take 5 mg by mouth at bedtime as needed for sleep.       No current facility-administered medications for this visit.    OBJECTIVE: Middle-aged woman in no acute distress Filed Vitals:   02/08/13 1354  BP: 153/83  Pulse: 76  Temp: 98.4 F (36.9 C)  Resp: 20     Body mass index is 38.67 kg/(m^2).    ECOG FS: 1  Sclerae unicteric Oropharynx clear No cervical or supraclavicular adenopathy Lungs no rales or rhonchi Heart regular rate and rhythm Abd benign MSK no focal spinal tenderness, no peripheral edema Neuro: non-focal, well-oriented, appropriate affect Breasts: The right breast is status post lumpectomy and radiation. There is no evidence of local recurrence. The right axilla is benign. The left breast is unremarkable.   LAB RESULTS:  CMP     Component Value Date/Time   NA 140 07/21/2012 1536   NA 141 01/07/2012 1245   K 3.9 07/21/2012 1536   K 4.3 01/07/2012 1245   CL 102 07/21/2012 1536   CL 105 01/07/2012 1245   CO2 26 07/21/2012 1536   CO2 28 01/07/2012 1245   GLUCOSE 100* 07/21/2012 1536   GLUCOSE 87 01/07/2012 1245   BUN 12.0 07/21/2012 1536   BUN 13 01/07/2012 1245   CREATININE 0.7 07/21/2012 1536   CREATININE 0.59 01/07/2012 1245   CALCIUM 9.9 07/21/2012 1536   CALCIUM 9.4 01/07/2012 1245   PROT 7.4 07/21/2012 1536   PROT 6.6 01/07/2012 1245   ALBUMIN 3.8 07/21/2012 1536   ALBUMIN 3.9 01/07/2012 1245   AST 19 07/21/2012 1536   AST 15 01/07/2012 1245   ALT 14 07/21/2012 1536   ALT 16 01/07/2012 1245   ALKPHOS 65 07/21/2012 1536   ALKPHOS 60 01/07/2012 1245   BILITOT 0.37 07/21/2012 1536   BILITOT 0.6 01/07/2012 1245   GFRNONAA >60 08/27/2008 1257   GFRAA  Value: >60        The eGFR has been calculated using the MDRD equation. This calculation has not been  validated in all clinical situations. eGFR's persistently <60 mL/min signify possible Chronic Kidney Disease. 08/27/2008 1257    I No results found for this basename: SPEP, UPEP,  kappa and lambda light chains    Lab Results  Component Value Date   WBC 6.6 07/21/2012   NEUTROABS 3.9 07/21/2012   HGB 13.1 07/21/2012   HCT 37.5 07/21/2012   MCV 95.7 07/21/2012   PLT 229 07/21/2012      Chemistry      Component Value Date/Time   NA 140 07/21/2012 1536   NA 141 01/07/2012 1245   K 3.9 07/21/2012 1536   K 4.3 01/07/2012 1245   CL 102 07/21/2012 1536   CL 105 01/07/2012 1245   CO2 26 07/21/2012 1536   CO2 28 01/07/2012 1245   BUN 12.0 07/21/2012 1536   BUN 13 01/07/2012 1245   CREATININE 0.7 07/21/2012 1536   CREATININE 0.59 01/07/2012 1245      Component Value Date/Time   CALCIUM 9.9 07/21/2012 1536   CALCIUM 9.4 01/07/2012 1245   ALKPHOS 65 07/21/2012 1536   ALKPHOS 60 01/07/2012 1245   AST 19 07/21/2012 1536   AST 15 01/07/2012 1245   ALT 14 07/21/2012 1536   ALT 16 01/07/2012 1245   BILITOT 0.37 07/21/2012 1536   BILITOT 0.6 01/07/2012 1245       Lab Results  Component Value Date   LABCA2 24 07/21/2012    No components found with this basename: [id number]    No results found for this basename: INR,  in the last 168 hours  Urinalysis    Component Value Date/Time   LABSPEC 1.005 01/18/2012 1456    STUDIES: DEXA scan 10/06/2012 showed osteopenia with the lowest reading at the  lumbar spine, D- 18. This was a 4% decrease over the last 2 years.  Mammography 07/20/2012 was unremarkable   ASSESSMENT: 67 y.o. [place] woman  (1) status post biopsy of the right breast upper outer quadrant 07/16/2008 showed an invasive ductal carcinoma, low-grade, estrogen receptor 97% positive, progesterone receptor 93% positive, with an MIB-105% and no amplification of HER-2 by C[facility].  (2) status post right lumpectomy and sentinel lymph node biopsy 08/29/2008 for a pT1b pN0, stage  IA invasive ductal carcinoma, grade 1, with repeat HER-2 studies again negative.  (3) Completed adjuvant radiation April 2010   (4) started on tamoxifen may 2010, discontinued July 2010 because of endometrial bleeding; switched to letrozole which was tolerated poorly; started anastrozole September of 2010, with much better tolerance.  PLAN: Today we spent the better part of the 45 minute visit reviewing Tuesdays diagnosis, treatment history and prognosis. She understands she has an excellent chance of cure and that by completing 5 years of antiestrogen therapy next year she will not only have made that good prognosis better, but also significantly reduce the risk of a new breast cancer developing. Accordingly we'll likely we will release her from followup after her next visit here.  Today I renewed her alendronate and anastrozole prescriptions. She will continue on her calcium/magnesium/vitamin D supplementation and walking program. She knows to call for any problems that may develop before her next visit here.  Lowella Dell, MD   02/08/2013 2:12 PM

## 2013-02-09 LAB — VITAMIN D 25 HYDROXY (VIT D DEFICIENCY, FRACTURES): Vit D, 25-Hydroxy: 59 ng/mL (ref 30–89)

## 2013-02-13 ENCOUNTER — Encounter: Payer: Self-pay | Admitting: Nurse Practitioner

## 2013-02-13 ENCOUNTER — Other Ambulatory Visit: Payer: Self-pay | Admitting: Nurse Practitioner

## 2013-02-13 ENCOUNTER — Ambulatory Visit (INDEPENDENT_AMBULATORY_CARE_PROVIDER_SITE_OTHER): Payer: Medicare Other | Admitting: Nurse Practitioner

## 2013-02-13 VITALS — BP 132/88 | HR 90 | Temp 97.4°F | Resp 16 | Ht 64.0 in | Wt 227.0 lb

## 2013-02-13 DIAGNOSIS — N390 Urinary tract infection, site not specified: Secondary | ICD-10-CM

## 2013-02-13 MED ORDER — NITROFURANTOIN MONOHYD MACRO 100 MG PO CAPS: 100.0000 mg | ORAL_CAPSULE | Freq: Two times a day (BID) | ORAL | Status: DC

## 2013-02-13 NOTE — Patient Instructions (Addendum)
Urinary Tract Infection  Urinary tract infections (UTIs) can develop anywhere along your urinary tract. Your urinary tract is your body's drainage system for removing wastes and extra water. Your urinary tract includes two kidneys, two ureters, a bladder, and a urethra. Your kidneys are a pair of bean-shaped organs. Each kidney is about the size of your fist. They are located below your ribs, one on each side of your spine.  CAUSES  Infections are caused by microbes, which are microscopic organisms, including fungi, viruses, and bacteria. These organisms are so small that they can only be seen through a microscope. Bacteria are the microbes that most commonly cause UTIs.  SYMPTOMS   Symptoms of UTIs may vary by age and gender of the patient and by the location of the infection. Symptoms in young women typically include a frequent and intense urge to urinate and a painful, burning feeling in the bladder or urethra during urination. Older women and men are more likely to be tired, shaky, and weak and have muscle aches and abdominal pain. A fever may mean the infection is in your kidneys. Other symptoms of a kidney infection include pain in your back or sides below the ribs, nausea, and vomiting.  DIAGNOSIS  To diagnose a UTI, your caregiver will ask you about your symptoms. Your caregiver also will ask to provide a urine sample. The urine sample will be tested for bacteria and white blood cells. White blood cells are made by your body to help fight infection.  TREATMENT   Typically, UTIs can be treated with medication. Because most UTIs are caused by a bacterial infection, they usually can be treated with the use of antibiotics. The choice of antibiotic and length of treatment depend on your symptoms and the type of bacteria causing your infection.  HOME CARE INSTRUCTIONS   If you were prescribed antibiotics, take them exactly as your caregiver instructs you. Finish the medication even if you feel better after you  have only taken some of the medication.   Drink enough water and fluids to keep your urine clear or pale yellow.   Avoid caffeine, tea, and carbonated beverages. They tend to irritate your bladder.   Empty your bladder often. Avoid holding urine for long periods of time.   Empty your bladder before and after sexual intercourse.   After a bowel movement, women should cleanse from front to back. Use each tissue only once.  SEEK MEDICAL CARE IF:    You have back pain.   You develop a fever.   Your symptoms do not begin to resolve within 3 days.  SEEK IMMEDIATE MEDICAL CARE IF:    You have severe back pain or lower abdominal pain.   You develop chills.   You have nausea or vomiting.   You have continued burning or discomfort with urination.  MAKE SURE YOU:    Understand these instructions.   Will watch your condition.   Will get help right away if you are not doing well or get worse.  Document Released: 04/21/2005 Document Revised: 01/11/2012 Document Reviewed: 08/20/2011  Exit[employer] Patient Information 2014 [employer].

## 2013-02-13 NOTE — Progress Notes (Signed)
Patient ID: [name], female   DOB: Aug 06, 1945, 67 y.o.   MRN: [id number]   Allergies  Allergen Reactions  . Adhesive (Tape) Dermatitis  . Cosopt (Dorzolamide Hcl-Timolol Mal) Other (See Comments)    Turns eye red, increases eye pressure  . Penicillins   . Tamoxifen Other (See Comments)    Vaginal bleeding  . Thimerosal     Makes pt eye red     Chief Complaint  Patient presents with  . Acute Visit    possible UTI    HPI: Patient is a 67 y.o. female seen in the office today for possible UTI. Takes cranberry pill normally but has not been taking them for a few weeks. Has history of frequent UTIs 20 years ago- generally does not culture anything out but antibiotics help. For the past 5 days has been having pressure, burning, frequency and has been taking AZO standard which helps but when it wears off symptoms are back . Has been drinking a lot of water. No fevers or chills. No blood in urine  Review of Systems:  Review of Systems  Constitutional: Negative for fever and chills.  Respiratory: Negative for shortness of breath.   Cardiovascular: Negative for chest pain.  Genitourinary: Positive for dysuria, urgency and frequency. Negative for hematuria and flank pain.  Neurological: Negative for weakness and headaches.     Past Medical History  Diagnosis Date  . Hiatal hernia   . Diverticulosis   . Cancer     breast   . Shingles     in eyes  . GERD (gastroesophageal reflux disease)   . Hyperlipidemia   . Other abnormal blood chemistry   . Pain in joint, pelvic region and thigh   . Trigger finger (acquired)   . Dysuria   . Asymptomatic varicose veins   . Unspecified cataract   . Malignant neoplasm of breast (female), unspecified site   . Stricture and stenosis of esophagus   . Unspecified glaucoma(365.9)   . Migraine without aura, without mention of intractable migraine without mention of status migrainosus    Past Surgical History  Procedure Laterality Date  .  Esophageal dilation    . Appendectomy  1973    Dr. Wilber Bihari  . Breast surgery Right 08/29/2008    Dr. Harriette Bouillon   Social History:   reports that she has never smoked. She does not have any smokeless tobacco history on file. She reports that  drinks alcohol. She reports that she does not use illicit drugs.  Family History  Problem Relation Age of Onset  . Hypertension Mother   . Hyperlipidemia Mother   . Alzheimer's disease Mother   . Emphysema Father   . COPD Father     Medications: Patient's Medications  New Prescriptions   No medications on file  Previous Medications   ALENDRONATE (FOSAMAX) 35 MG TABLET    Take 1 tablet (35 mg total) by mouth every 7 (seven) days. Take with a full glass of water on an empty stomach.   ANASTROZOLE (ARIMIDEX) 1 MG TABLET    TAKE ONE TABLET BY MOUTH EVERY DAY   B COMPLEX VITAMINS TABLET    Take 1 tablet by mouth daily.     CALCIUM CARBONATE (OS-CAL) 600 MG TABS    Take 600 mg by mouth 2 (two) times daily with a meal. Take 1 tablet daily for bone health.   CALCIUM CITRATE-VITAMIN D 250-200 MG-UNIT TABS    Take 250 mg by mouth 2 (two)  times daily.   CHOLECALCIFEROL (VITAMIN D3) 2000 UNITS CHEW    Chew 2,000 Units by mouth daily. Take 1 tablet daily for vitamin D supplement.   HYDROCODONE-ACETAMINOPHEN (NORCO) 10-325 MG PER TABLET    Take 1 tablet by mouth every 8 (eight) hours as needed for pain.   LOTEMAX 0.5 % OPHTHALMIC SUSPENSION    Place 1 drop into the right eye 2 (two) times daily.    MULTIPLE VITAMIN (MULTIVITAMIN) CAPSULE    Take 1 capsule by mouth daily.     OMEGA-3 FATTY ACIDS (FISH OIL) 1000 MG CAPS    Take one tablet once daily to lower cholesterol   OMEPRAZOLE (PRILOSEC) 20 MG CAPSULE    Take one tablet twice daily for reflux   TIMOLOL (BETIMOL) 0.5 % OPHTHALMIC SOLUTION    1 drop 2 (two) times daily.     VALACYCLOVIR (VALTREX) 1000 MG TABLET    Take 1,000 mg by mouth daily.    VITAMIN C (ASCORBIC ACID) 500 MG TABLET    Take one  tablet once daily for supplement  Modified Medications   No medications on file  Discontinued Medications   FLUOROMETHOLONE (FML) 0.1 % OPHTHALMIC SUSPENSION       TRAMADOL (ULTRAM) 50 MG TABLET    Take 50 mg by mouth every 6 (six) hours as needed for pain. Take 1 tablet up to 4 times daily as needed to control pain.   ZOLPIDEM (AMBIEN) 5 MG TABLET    Take 5 mg by mouth at bedtime as needed for sleep.     Physical Exam:  Filed Vitals:   02/13/13 1018  BP: 132/88  Pulse: 90  Temp: 97.4 F (36.3 C)  TempSrc: Oral  Resp: 16  Height: 5\' 4"  (1.626 m)  Weight: 227 lb (102.967 kg)  SpO2: 95%   Physical Exam  Constitutional: She is oriented to person, place, and time and well-developed, well-nourished, and in no distress. No distress.  HENT:  Head: Normocephalic and atraumatic.  Cardiovascular: Normal rate and regular rhythm.   Pulmonary/Chest: Effort normal and breath sounds normal. No respiratory distress.  Abdominal: Soft. Bowel sounds are normal. She exhibits no distension. There is no tenderness. There is no rebound and no guarding.  Neurological: She is alert and oriented to person, place, and time.  Skin: Skin is warm and dry. She is not diaphoretic. No erythema.     Labs reviewed: Basic Metabolic Panel:  Recent Labs  16/10/96 1536 02/08/13 1512  NA 140 141  K 3.9 4.2  CL 102  --   CO2 26 27  GLUCOSE 100* 105  BUN 12.0 10.9  CREATININE 0.7 0.7  CALCIUM 9.9 10.1   Liver Function Tests:  Recent Labs  07/21/12 1536 02/08/13 1512  AST 19 17  ALT 14 19  ALKPHOS 65 66  BILITOT 0.37 0.30  PROT 7.4 7.5  ALBUMIN 3.8 3.7   No results found for this basename: LIPASE, AMYLASE,  in the last 8760 hours No results found for this basename: AMMONIA,  in the last 8760 hours CBC:  Recent Labs  07/21/12 1536 02/08/13 1512  WBC 6.6 5.9  NEUTROABS 3.9 3.9  HGB 13.1 13.1  HCT 37.5 38.6  MCV 95.7 96.9  PLT 229 244      Assessment/Plan  Dysuria/UTI- UA shows  Leukocytes of 500 and nitrite positive- will have pt start macroBID 100 mg twice daily for 10 days. Will send off for culture and sensitivity. Increase water intake and cont cranberry table.  To follow up if symptoms do not improve or get worse or fevers occur.

## 2013-02-15 ENCOUNTER — Telehealth: Payer: Self-pay | Admitting: *Deleted

## 2013-02-15 NOTE — Telephone Encounter (Signed)
Patient called and stated that she just left a Urine Culture on the 22nd. I checked and the culture is not back yet. Patient stated that she had a fever of 98. I offered patient an appointment to come in today to be reevaluated but patient refused. Told patient that if she didn't hear back from [place] by tomorrow to call us back and for her to continue the antibiotic and drink plenty of fluids. Patient agreed

## 2013-02-22 LAB — URINE CULTURE

## 2013-02-23 DIAGNOSIS — B0051 Herpesviral iridocyclitis: Secondary | ICD-10-CM | POA: Diagnosis not present

## 2013-02-23 DIAGNOSIS — H4040X0 Glaucoma secondary to eye inflammation, unspecified eye, stage unspecified: Secondary | ICD-10-CM | POA: Diagnosis not present

## 2013-02-23 DIAGNOSIS — Z961 Presence of intraocular lens: Secondary | ICD-10-CM | POA: Diagnosis not present

## 2013-02-23 DIAGNOSIS — H409 Unspecified glaucoma: Secondary | ICD-10-CM | POA: Diagnosis not present

## 2013-02-23 DIAGNOSIS — Z9849 Cataract extraction status, unspecified eye: Secondary | ICD-10-CM | POA: Diagnosis not present

## 2013-02-23 DIAGNOSIS — D3130 Benign neoplasm of unspecified choroid: Secondary | ICD-10-CM | POA: Diagnosis not present

## 2013-02-28 ENCOUNTER — Other Ambulatory Visit: Payer: Self-pay

## 2013-03-07 ENCOUNTER — Other Ambulatory Visit: Payer: Self-pay | Admitting: Internal Medicine

## 2013-04-04 ENCOUNTER — Other Ambulatory Visit: Payer: Self-pay | Admitting: Oncology

## 2013-04-04 DIAGNOSIS — C50919 Malignant neoplasm of unspecified site of unspecified female breast: Secondary | ICD-10-CM

## 2013-04-04 DIAGNOSIS — C50511 Malignant neoplasm of lower-outer quadrant of right female breast: Secondary | ICD-10-CM

## 2013-04-11 ENCOUNTER — Other Ambulatory Visit: Payer: Self-pay | Admitting: Internal Medicine

## 2013-05-07 ENCOUNTER — Other Ambulatory Visit: Payer: Self-pay | Admitting: *Deleted

## 2013-05-10 ENCOUNTER — Other Ambulatory Visit: Payer: Self-pay | Admitting: *Deleted

## 2013-05-10 MED ORDER — HYDROCODONE-ACETAMINOPHEN 10-325 MG PO TABS: ORAL_TABLET | ORAL | Status: DC

## 2013-05-17 DIAGNOSIS — B0051 Herpesviral iridocyclitis: Secondary | ICD-10-CM | POA: Diagnosis not present

## 2013-05-17 DIAGNOSIS — Z9849 Cataract extraction status, unspecified eye: Secondary | ICD-10-CM | POA: Diagnosis not present

## 2013-05-17 DIAGNOSIS — Z961 Presence of intraocular lens: Secondary | ICD-10-CM | POA: Diagnosis not present

## 2013-05-17 DIAGNOSIS — H1045 Other chronic allergic conjunctivitis: Secondary | ICD-10-CM | POA: Diagnosis not present

## 2013-05-21 DIAGNOSIS — Z23 Encounter for immunization: Secondary | ICD-10-CM | POA: Diagnosis not present

## 2013-05-31 ENCOUNTER — Other Ambulatory Visit: Payer: Self-pay

## 2013-06-11 ENCOUNTER — Ambulatory Visit (INDEPENDENT_AMBULATORY_CARE_PROVIDER_SITE_OTHER): Payer: Medicare Other | Admitting: Surgery

## 2013-06-11 ENCOUNTER — Encounter (INDEPENDENT_AMBULATORY_CARE_PROVIDER_SITE_OTHER): Payer: Self-pay | Admitting: Surgery

## 2013-06-11 VITALS — BP 128/82 | HR 80 | Resp 16 | Ht 64.0 in | Wt 227.2 lb

## 2013-06-11 DIAGNOSIS — Z853 Personal history of malignant neoplasm of breast: Secondary | ICD-10-CM | POA: Diagnosis not present

## 2013-06-11 NOTE — Progress Notes (Signed)
Subjective:     Patient ID: [name], female   DOB: 02/10/1946, 67 y.o.   MRN: [id number]  HPI The patient returns today in followup for her stage I right breast cancer. She underwent a right breast lumpectomy with sentinel lymph node mapping in February of 2010 for a T1 N0 MX ER positive PR positive HER-2/neu negative right breast cancer. She underwent lumpectomy and subsequent post operative radiation therapy. She is being maintained on femara. She still has some mild discomfort in her right breast. She denies any nipple discharge or any new masses that she can feel. Has arthalgia.  Review of Systems  Constitutional: Negative.   HENT: Negative.   Eyes: Negative.   Respiratory: Negative.   Cardiovascular: Negative.   Gastrointestinal: Negative.   Genitourinary: Negative.   Neurological: Negative.   Hematological: Negative.   Psychiatric/Behavioral: Negative.        Objective:   Physical Exam  Constitutional: She appears well-developed and well-nourished.  HENT:  Head: Normocephalic and atraumatic.  Nose: Nose normal.  Eyes: Conjunctivae and EOM are normal. Pupils are equal, round, and reactive to light.  Neck: Normal range of motion. Neck supple.  Cardiovascular: Normal rate, regular rhythm and normal heart sounds.   Pulmonary/Chest: Effort normal and breath sounds normal.       Both breasts were examined today. Postsurgical changes noted right breast. Minimal volume loss to right breast. No suspicious masses in the right breast. Chronic inversion of right nipple noted. Right axilla is normal. Left breast is normal. The left axilla normal    She has no cervical adenopathy.  Right lumpectomy changes are present. There are benign dystrophic  calcifications in the lumpectomy site. There is no suspicious  dominant mass, nonsurgical architectural distortion or  calcification to suggest malignancy.  Mammographic images were processed with CAD.  IMPRESSION:  No mammographic  evidence of malignancy.  RECOMMENDATION:  Yearly diagnostic mammography is suggested.  I have discussed the findings and recommendations with the patient.  Results were also provided in writing at the conclusion of the  visit.  BI-RADS CATEGORY 2: Benign finding(s).      Assessment:     Stage I right breast cancer 2014 s/p breast conservation.     Plan:     Her next mammogram is due in December 2014. Her mammogram from 2011 and was within normal limits. She will continue her followup with Dr. Darnelle Catalan.   I will see her back.

## 2013-06-11 NOTE — Patient Instructions (Signed)
Return 1 year. 

## 2013-06-20 DIAGNOSIS — H1045 Other chronic allergic conjunctivitis: Secondary | ICD-10-CM | POA: Diagnosis not present

## 2013-06-20 DIAGNOSIS — B0051 Herpesviral iridocyclitis: Secondary | ICD-10-CM | POA: Diagnosis not present

## 2013-07-23 ENCOUNTER — Ambulatory Visit
Admission: RE | Admit: 2013-07-23 | Discharge: 2013-07-23 | Disposition: A | Discharge status: Home / Self Care | Payer: Medicare Other | Source: Ambulatory Visit | Attending: Oncology | Admitting: Oncology

## 2013-07-23 DIAGNOSIS — Z853 Personal history of malignant neoplasm of breast: Secondary | ICD-10-CM | POA: Diagnosis not present

## 2013-07-23 DIAGNOSIS — R928 Other abnormal and inconclusive findings on diagnostic imaging of breast: Secondary | ICD-10-CM | POA: Diagnosis not present

## 2013-07-23 DIAGNOSIS — C50511 Malignant neoplasm of lower-outer quadrant of right female breast: Secondary | ICD-10-CM

## 2013-07-30 ENCOUNTER — Other Ambulatory Visit: Payer: Medicare Other

## 2013-07-31 ENCOUNTER — Ambulatory Visit: Payer: Medicare Other | Admitting: Internal Medicine

## 2013-08-03 ENCOUNTER — Other Ambulatory Visit: Payer: Medicare Other

## 2013-08-06 ENCOUNTER — Other Ambulatory Visit: Payer: Self-pay | Admitting: *Deleted

## 2013-08-06 MED ORDER — HYDROCODONE-ACETAMINOPHEN 10-325 MG PO TABS: ORAL_TABLET | ORAL | Status: DC

## 2013-08-07 ENCOUNTER — Ambulatory Visit: Payer: Medicare Other | Admitting: Internal Medicine

## 2013-08-16 DIAGNOSIS — N390 Urinary tract infection, site not specified: Secondary | ICD-10-CM | POA: Diagnosis not present

## 2013-10-11 ENCOUNTER — Other Ambulatory Visit: Payer: Medicare Other

## 2013-10-11 DIAGNOSIS — C50511 Malignant neoplasm of lower-outer quadrant of right female breast: Secondary | ICD-10-CM

## 2013-10-11 DIAGNOSIS — C50519 Malignant neoplasm of lower-outer quadrant of unspecified female breast: Secondary | ICD-10-CM | POA: Diagnosis not present

## 2013-10-12 DIAGNOSIS — Z9889 Other specified postprocedural states: Secondary | ICD-10-CM | POA: Diagnosis not present

## 2013-10-12 DIAGNOSIS — H579 Unspecified disorder of eye and adnexa: Secondary | ICD-10-CM | POA: Diagnosis not present

## 2013-10-12 DIAGNOSIS — Z48810 Encounter for surgical aftercare following surgery on the sense organs: Secondary | ICD-10-CM | POA: Diagnosis not present

## 2013-10-12 DIAGNOSIS — Z8669 Personal history of other diseases of the nervous system and sense organs: Secondary | ICD-10-CM | POA: Diagnosis not present

## 2013-10-12 DIAGNOSIS — Z8619 Personal history of other infectious and parasitic diseases: Secondary | ICD-10-CM | POA: Diagnosis not present

## 2013-10-12 DIAGNOSIS — H5789 Other specified disorders of eye and adnexa: Secondary | ICD-10-CM | POA: Diagnosis not present

## 2013-10-12 DIAGNOSIS — H409 Unspecified glaucoma: Secondary | ICD-10-CM | POA: Diagnosis not present

## 2013-10-12 DIAGNOSIS — B0051 Herpesviral iridocyclitis: Secondary | ICD-10-CM | POA: Diagnosis not present

## 2013-10-12 DIAGNOSIS — H4040X0 Glaucoma secondary to eye inflammation, unspecified eye, stage unspecified: Secondary | ICD-10-CM | POA: Diagnosis not present

## 2013-10-12 DIAGNOSIS — Z9849 Cataract extraction status, unspecified eye: Secondary | ICD-10-CM | POA: Diagnosis not present

## 2013-10-12 DIAGNOSIS — D3190 Benign neoplasm of unspecified part of unspecified eye: Secondary | ICD-10-CM | POA: Diagnosis not present

## 2013-10-12 DIAGNOSIS — Z961 Presence of intraocular lens: Secondary | ICD-10-CM | POA: Diagnosis not present

## 2013-10-12 LAB — COMPREHENSIVE METABOLIC PANEL
ALT: 19 IU/L (ref 0–32)
AST: 19 IU/L (ref 0–40)
Albumin/Globulin Ratio: 1.9 (ref 1.1–2.5)
Albumin: 4.5 g/dL (ref 3.6–4.8)
Alkaline Phosphatase: 67 IU/L (ref 39–117)
BUN/Creatinine Ratio: 18 (ref 11–26)
BUN: 12 mg/dL (ref 8–27)
CALCIUM: 10 mg/dL (ref 8.7–10.3)
CO2: 25 mmol/L (ref 18–29)
Chloride: 100 mmol/L (ref 97–108)
Creatinine, Ser: 0.67 mg/dL (ref 0.57–1.00)
GFR calc Af Amer: 104 mL/min/{1.73_m2} (ref >59)
GFR calc non Af Amer: 91 mL/min/{1.73_m2} (ref >59)
GLUCOSE: 106 mg/dL — AB (ref 65–99)
Globulin, Total: 2.4 g/dL (ref 1.5–4.5)
POTASSIUM: 4.4 mmol/L (ref 3.5–5.2)
SODIUM: 143 mmol/L (ref 134–144)
TOTAL PROTEIN: 6.9 g/dL (ref 6.0–8.5)
Total Bilirubin: 0.3 mg/dL (ref 0.0–1.2)

## 2013-10-12 LAB — CBC WITH DIFFERENTIAL
BASOS: 1 %
Basophils Absolute: 0.1 10*3/uL (ref 0.0–0.2)
EOS ABS: 0.2 10*3/uL (ref 0.0–0.4)
EOS: 3 %
HCT: 40.7 % (ref 34.0–46.6)
HEMOGLOBIN: 13.4 g/dL (ref 11.1–15.9)
IMMATURE GRANS (ABS): 0 10*3/uL (ref 0.0–0.1)
IMMATURE GRANULOCYTES: 0 %
LYMPHS: 25 %
Lymphocytes Absolute: 1.8 10*3/uL (ref 0.7–3.1)
MCH: 31.8 pg (ref 26.6–33.0)
MCHC: 32.9 g/dL (ref 31.5–35.7)
MCV: 96 fL (ref 79–97)
MONOS ABS: 0.7 10*3/uL (ref 0.1–0.9)
Monocytes: 10 %
NEUTROS PCT: 61 %
Neutrophils Absolute: 4.5 10*3/uL (ref 1.4–7.0)
PLATELETS: 269 10*3/uL (ref 150–379)
RBC: 4.22 x10E6/uL (ref 3.77–5.28)
RDW: 12.9 % (ref 12.3–15.4)
WBC: 7.3 10*3/uL (ref 3.4–10.8)

## 2013-10-16 ENCOUNTER — Ambulatory Visit (INDEPENDENT_AMBULATORY_CARE_PROVIDER_SITE_OTHER): Payer: Medicare Other | Admitting: Internal Medicine

## 2013-10-16 ENCOUNTER — Encounter: Payer: Self-pay | Admitting: Internal Medicine

## 2013-10-16 VITALS — BP 140/86 | HR 80 | Wt 230.0 lb

## 2013-10-16 DIAGNOSIS — J3489 Other specified disorders of nose and nasal sinuses: Secondary | ICD-10-CM

## 2013-10-16 DIAGNOSIS — E669 Obesity, unspecified: Secondary | ICD-10-CM

## 2013-10-16 DIAGNOSIS — M5431 Sciatica, right side: Secondary | ICD-10-CM

## 2013-10-16 DIAGNOSIS — E785 Hyperlipidemia, unspecified: Secondary | ICD-10-CM

## 2013-10-16 DIAGNOSIS — C50519 Malignant neoplasm of lower-outer quadrant of unspecified female breast: Secondary | ICD-10-CM

## 2013-10-16 DIAGNOSIS — C50511 Malignant neoplasm of lower-outer quadrant of right female breast: Secondary | ICD-10-CM

## 2013-10-16 DIAGNOSIS — K222 Esophageal obstruction: Secondary | ICD-10-CM

## 2013-10-16 DIAGNOSIS — J340 Abscess, furuncle and carbuncle of nose: Secondary | ICD-10-CM | POA: Insufficient documentation

## 2013-10-16 DIAGNOSIS — M67439 Ganglion, unspecified wrist: Secondary | ICD-10-CM

## 2013-10-16 DIAGNOSIS — M5430 Sciatica, unspecified side: Secondary | ICD-10-CM | POA: Diagnosis not present

## 2013-10-16 DIAGNOSIS — L821 Other seborrheic keratosis: Secondary | ICD-10-CM | POA: Insufficient documentation

## 2013-10-16 DIAGNOSIS — M6740 Ganglion, unspecified site: Secondary | ICD-10-CM

## 2013-10-16 MED ORDER — MUPIROCIN 2 % EX OINT: 1.0000 "application " | TOPICAL_OINTMENT | Freq: Two times a day (BID) | CUTANEOUS | Status: DC

## 2013-10-16 MED ORDER — ZOSTER VACCINE LIVE 19400 UNT/0.65ML ~~LOC~~ SOLR: 0.6500 mL | Freq: Once | SUBCUTANEOUS | Status: DC

## 2013-10-16 MED ORDER — HYDROCODONE-ACETAMINOPHEN 10-325 MG PO TABS: ORAL_TABLET | ORAL | Status: DC

## 2013-10-16 NOTE — Patient Instructions (Signed)
Continue medications as listed 

## 2013-10-16 NOTE — Progress Notes (Signed)
Patient ID: [name], female   DOB: Sep 25, 1945, 68 y.o.   MRN: [id number]    Location:    [facility]  Place of Service:  OFFICE   Allergies  Allergen Reactions  . Adhesive [Tape] Dermatitis  . Betimol [Timolol Maleate]     Allergic to preservatives   . Cosopt [Dorzolamide Hcl-Timolol Mal] Other (See Comments)    Turns eye red, increases eye pressure  . Penicillins   . Tamoxifen Other (See Comments)    Vaginal bleeding  . Thimerosal     Makes pt eye red     Chief Complaint  Patient presents with  . Medical Managment of Chronic Issues    6 month follow-up and discuss labs (copy given)   . Wrist Problem    Re-Examine right wrist- raised area (changed in size)   . Hip Problem    Examine right side of hip, patient with rough area  . Medication Refill    Renew pain medication   . Herpes Zoster    Discuss rx for shingles vaccine , patient see eye doctor at [facility] on Friday and was told right eye is clear from shingles     HPI:  Breast cancer of lower-outer quadrant of right female breast: no reoccurrence  Obesity, unspecified; no weight loss  Nasal septum ulceration: persists and has had some bleeding  Sciatica of right side: persists. Improves with rest.  Esophageal stenosis: denies dysphagia  Ganglion cyst of wrist: enlarging on the right wrist. Now uncomfortable, but is not painful.  Seborrheic keratosis: multiple, noninflamed. Largest is on the right hip.    Medications: Patient's Medications  New Prescriptions   No medications on file  Previous Medications   ALENDRONATE (FOSAMAX) 35 MG TABLET    TAKE ONE TABLET BY MOUTH EVERY 7 DAYS. TAKE WITH A FULL GLASS OF WATER ON AN EMPTY STOMACH.   ANASTROZOLE (ARIMIDEX) 1 MG TABLET    TAKE ONE TABLET BY MOUTH ONCE DAILY   B COMPLEX VITAMINS TABLET    Take 1 tablet by mouth daily.     BETAXOLOL (BETOPTIC-S) 0.5 % OPHTHALMIC SUSPENSION    Place 1 drop into the right eye 2 (two) times daily.   CALCIUM CITRATE-VITAMIN D  250-200 MG-UNIT TABS    Take 250 mg by mouth 2 (two) times daily.   CHOLECALCIFEROL (VITAMIN D3) 2000 UNITS CHEW    Chew 2,000 Units by mouth daily. Take 1 tablet daily for vitamin D supplement.   HYDROCODONE-ACETAMINOPHEN (NORCO) 10-325 MG PER TABLET    Take one tablet by mouth every 8 hours as needed for pain   LOTEMAX 0.5 % OPHTHALMIC SUSPENSION    Place 1 drop into the right eye 2 (two) times daily.    OMEGA-3 FATTY ACIDS (FISH OIL) 1000 MG CAPS    Take one tablet once daily to lower cholesterol   OMEPRAZOLE (PRILOSEC) 20 MG CAPSULE    Take one tablet daily for reflux   VALACYCLOVIR (VALTREX) 1000 MG TABLET    Take 1,000 mg by mouth daily.    VITAMIN C (ASCORBIC ACID) 500 MG TABLET    Take one tablet once daily for supplement  Modified Medications   Modified Medication Previous Medication   ZOSTER VACCINE LIVE, PF, (ZOSTAVAX) 41740 UNT/0.65ML INJECTION zoster vaccine live, PF, (ZOSTAVAX) 81448 UNT/0.65ML injection      Inject 19,400 Units into the skin once.    Inject 0.65 mLs into the skin once.  Discontinued Medications   CALCIUM CARBONATE (OS-CAL) 600 MG  TABS    Take 600 mg by mouth 2 (two) times daily with a meal. Take 1 tablet daily for bone health.   MULTIPLE VITAMIN (MULTIVITAMIN) CAPSULE    Take 1 capsule by mouth daily.     NITROFURANTOIN, MACROCRYSTAL-MONOHYDRATE, (MACROBID) 100 MG CAPSULE    Take 1 capsule (100 mg total) by mouth 2 (two) times daily.   TIMOLOL (BETIMOL) 0.5 % OPHTHALMIC SOLUTION    1 drop 2 (two) times daily.       Review of Systems  Constitutional: Positive for appetite change. Negative for fever and chills.       Decreased appetite  HENT: Positive for dental problem. Negative for congestion, ear pain and rhinorrhea.   Eyes:       History of glaucoma. History of cataracts. History of a torn retina in the right eye 03/06/2010 associated with a dense vitreous hemorrhage. She had laser therapy and cryotherapy to the right. History of herpes zoster of the right.  Takes Valtrex continuously.  Respiratory: Negative for cough and chest tightness.   Cardiovascular: Negative for chest pain, palpitations and leg swelling.  Gastrointestinal: Negative for diarrhea.       History mild dysphagia. Food seems to hang in the chest.  Genitourinary: Negative for dysuria, urgency, frequency, hematuria, flank pain, enuresis and genital sores.       History of ovarian cysts CT scan November 2002.  Musculoskeletal: Positive for myalgias.       Chronic back pains. Pain down the right leg suggestive of sciatica. Mild arthralgias in her hips and knees. Stable balance and gait. Enlarging ganglion cyst of the right wrist.  Skin: Negative for pallor, rash and wound.       SK right hip and other places.  Neurological: Negative for headaches.  Hematological: Negative.   Psychiatric/Behavioral: Negative.  Negative for behavioral problems, confusion, sleep disturbance and agitation.    Filed Vitals:   10/16/13 1529  BP: 140/86  Pulse: 80  Weight: 230 lb (104.327 kg)  SpO2: 97%   Physical Exam  Constitutional: She is oriented to person, place, and time. She appears well-developed and well-nourished. No distress.  Moderately overweight.  HENT:  Head: Normocephalic and atraumatic.  Right Ear: External ear normal.  Left Ear: External ear normal.  Nose: Nose normal.  Mouth/Throat: Oropharynx is clear and moist.  Dentures.  Eyes: Conjunctivae and EOM are normal. Pupils are equal, round, and reactive to light.  Neck: Neck supple. No JVD present. No tracheal deviation present. No thyromegaly present.  Cardiovascular: Normal rate, normal heart sounds and intact distal pulses.  Exam reveals no gallop and no friction rub.   No murmur heard. Pulmonary/Chest: Breath sounds normal. No respiratory distress. She has no wheezes. She has no rales.  Abdominal: Bowel sounds are normal. She exhibits no distension and no mass. There is no tenderness.  Musculoskeletal: Normal range of  motion. She exhibits no edema and no tenderness.  Ganglion cyst at the dorsum of the right wrist.  Lymphadenopathy:    She has no cervical adenopathy.  Neurological: She is alert and oriented to person, place, and time. She has normal reflexes.  Skin: No rash noted. No erythema. No pallor.  SK right hip  Psychiatric: She has a normal mood and affect. Her behavior is normal. Judgment and thought content normal.     Labs reviewed: Office Visit on 10/16/2013  Component Date Value Ref Range Status  . HM Dexa Scan 09/24/2008 Dr.Rubin ordered   Final  Appointment on  10/11/2013  Component Date Value Ref Range Status  . WBC 10/11/2013 7.3  3.4 - 10.8 x10E3/uL Final  . RBC 10/11/2013 4.22  3.77 - 5.28 x10E6/uL Final  . Hemoglobin 10/11/2013 13.4  11.1 - 15.9 g/dL Final  . HCT 10/11/2013 40.7  34.0 - 46.6 % Final  . MCV 10/11/2013 96  79 - 97 fL Final  . MCH 10/11/2013 31.8  26.6 - 33.0 pg Final  . MCHC 10/11/2013 32.9  31.5 - 35.7 g/dL Final  . RDW 10/11/2013 12.9  12.3 - 15.4 % Final  . Platelets 10/11/2013 269  150 - 379 x10E3/uL Final  . Neutrophils Relative % 10/11/2013 61   Final  . Lymphs 10/11/2013 25   Final  . Monocytes 10/11/2013 10   Final  . Eos 10/11/2013 3   Final  . Basos 10/11/2013 1   Final  . Neutrophils Absolute 10/11/2013 4.5  1.4 - 7.0 x10E3/uL Final  . Lymphocytes Absolute 10/11/2013 1.8  0.7 - 3.1 x10E3/uL Final  . Monocytes Absolute 10/11/2013 0.7  0.1 - 0.9 x10E3/uL Final  . Eosinophils Absolute 10/11/2013 0.2  0.0 - 0.4 x10E3/uL Final  . Basophils Absolute 10/11/2013 0.1  0.0 - 0.2 x10E3/uL Final  . Immature Granulocytes 10/11/2013 0   Final  . Immature Grans (Abs) 10/11/2013 0.0  0.0 - 0.1 x10E3/uL Final  . Glucose 10/11/2013 106* 65 - 99 mg/dL Final  . BUN 10/11/2013 12  8 - 27 mg/dL Final  . Creatinine, Ser 10/11/2013 0.67  0.57 - 1.00 mg/dL Final  . GFR calc non Af Amer 10/11/2013 91  >59 mL/min/1.73 Final  . GFR calc Af Amer 10/11/2013 104  >59  mL/min/1.73 Final  . BUN/Creatinine Ratio 10/11/2013 18  11 - 26 Final  . Sodium 10/11/2013 143  134 - 144 mmol/L Final  . Potassium 10/11/2013 4.4  3.5 - 5.2 mmol/L Final  . Chloride 10/11/2013 100  97 - 108 mmol/L Final  . CO2 10/11/2013 25  18 - 29 mmol/L Final  . Calcium 10/11/2013 10.0  8.7 - 10.3 mg/dL Final  . Total Protein 10/11/2013 6.9  6.0 - 8.5 g/dL Final  . Albumin 10/11/2013 4.5  3.6 - 4.8 g/dL Final  . Globulin, Total 10/11/2013 2.4  1.5 - 4.5 g/dL Final  . Albumin/Globulin Ratio 10/11/2013 1.9  1.1 - 2.5 Final  . Total Bilirubin 10/11/2013 0.3  0.0 - 1.2 mg/dL Final  . Alkaline Phosphatase 10/11/2013 67  39 - 117 IU/L Final  . AST 10/11/2013 19  0 - 40 IU/L Final  . ALT 10/11/2013 19  0 - 32 IU/L Final      Assessment/Plan  1. Breast cancer of lower-outer quadrant of right female breast No relapse - CMP; Future  2. Obesity, unspecified Encouraged weight loss. Discussed diet  3. Nasal septum ulceration - mupirocin ointment (BACTROBAN) 2 %; Place 1 application into the nose 2 (two) times daily.  Dispense: 22 g; Refill: 5  4. Sciatica of right side - HYDROcodone-acetaminophen (NORCO) 10-325 MG per tablet; Take one tablet by mouth every 8 hours as needed for pain  Dispense: 90 tablet; Refill: 0  5. Esophageal stenosis Continue omeprazole  6. Ganglion cyst of wrist Refer to ortho  7. Seborrheic keratosis Benign. Observe.  8. Other and unspecified hyperlipidemia controlled - Lipid panel; Future

## 2013-10-29 ENCOUNTER — Other Ambulatory Visit: Payer: Self-pay | Admitting: Orthopedic Surgery

## 2013-10-29 DIAGNOSIS — R229 Localized swelling, mass and lump, unspecified: Principal | ICD-10-CM

## 2013-10-29 DIAGNOSIS — IMO0002 Reserved for concepts with insufficient information to code with codable children: Secondary | ICD-10-CM

## 2013-10-29 DIAGNOSIS — D492 Neoplasm of unspecified behavior of bone, soft tissue, and skin: Secondary | ICD-10-CM | POA: Diagnosis not present

## 2013-11-05 ENCOUNTER — Ambulatory Visit
Admission: RE | Admit: 2013-11-05 | Discharge: 2013-11-05 | Disposition: A | Discharge status: Home / Self Care | Payer: Medicare Other | Source: Ambulatory Visit | Attending: Orthopedic Surgery | Admitting: Orthopedic Surgery

## 2013-11-05 DIAGNOSIS — M65839 Other synovitis and tenosynovitis, unspecified forearm: Secondary | ICD-10-CM | POA: Diagnosis not present

## 2013-11-05 DIAGNOSIS — M65849 Other synovitis and tenosynovitis, unspecified hand: Secondary | ICD-10-CM | POA: Diagnosis not present

## 2013-11-05 DIAGNOSIS — R229 Localized swelling, mass and lump, unspecified: Principal | ICD-10-CM

## 2013-11-05 DIAGNOSIS — IMO0002 Reserved for concepts with insufficient information to code with codable children: Secondary | ICD-10-CM

## 2013-11-05 MED ORDER — GADOBENATE DIMEGLUMINE 529 MG/ML IV SOLN
20.0000 mL | Freq: Once | INTRAVENOUS | Status: AC | PRN
  Administered 2013-11-05: 20 mL via INTRAVENOUS

## 2013-11-12 DIAGNOSIS — D492 Neoplasm of unspecified behavior of bone, soft tissue, and skin: Secondary | ICD-10-CM | POA: Diagnosis not present

## 2013-11-14 ENCOUNTER — Encounter: Payer: Self-pay | Admitting: Internal Medicine

## 2013-11-14 DIAGNOSIS — N390 Urinary tract infection, site not specified: Secondary | ICD-10-CM | POA: Diagnosis not present

## 2013-11-20 DIAGNOSIS — R229 Localized swelling, mass and lump, unspecified: Secondary | ICD-10-CM | POA: Diagnosis not present

## 2013-11-20 DIAGNOSIS — M799 Soft tissue disorder, unspecified: Secondary | ICD-10-CM | POA: Diagnosis not present

## 2013-11-20 DIAGNOSIS — C4360 Malignant melanoma of unspecified upper limb, including shoulder: Secondary | ICD-10-CM | POA: Diagnosis not present

## 2013-11-20 DIAGNOSIS — Z0389 Encounter for observation for other suspected diseases and conditions ruled out: Secondary | ICD-10-CM | POA: Diagnosis not present

## 2013-11-23 DIAGNOSIS — C4360 Malignant melanoma of unspecified upper limb, including shoulder: Secondary | ICD-10-CM | POA: Insufficient documentation

## 2013-11-24 DIAGNOSIS — C4360 Malignant melanoma of unspecified upper limb, including shoulder: Secondary | ICD-10-CM | POA: Diagnosis not present

## 2013-11-24 DIAGNOSIS — Z0389 Encounter for observation for other suspected diseases and conditions ruled out: Secondary | ICD-10-CM | POA: Diagnosis not present

## 2013-11-24 DIAGNOSIS — Z853 Personal history of malignant neoplasm of breast: Secondary | ICD-10-CM | POA: Diagnosis not present

## 2013-11-28 DIAGNOSIS — R918 Other nonspecific abnormal finding of lung field: Secondary | ICD-10-CM | POA: Diagnosis not present

## 2013-11-28 DIAGNOSIS — C4360 Malignant melanoma of unspecified upper limb, including shoulder: Secondary | ICD-10-CM | POA: Diagnosis not present

## 2013-11-30 DIAGNOSIS — C4360 Malignant melanoma of unspecified upper limb, including shoulder: Secondary | ICD-10-CM | POA: Diagnosis not present

## 2013-11-30 DIAGNOSIS — H269 Unspecified cataract: Secondary | ICD-10-CM | POA: Diagnosis not present

## 2013-11-30 DIAGNOSIS — Z853 Personal history of malignant neoplasm of breast: Secondary | ICD-10-CM | POA: Diagnosis not present

## 2013-11-30 DIAGNOSIS — Z79899 Other long term (current) drug therapy: Secondary | ICD-10-CM | POA: Diagnosis not present

## 2013-11-30 DIAGNOSIS — M129 Arthropathy, unspecified: Secondary | ICD-10-CM | POA: Diagnosis not present

## 2013-11-30 DIAGNOSIS — H409 Unspecified glaucoma: Secondary | ICD-10-CM | POA: Diagnosis not present

## 2013-12-05 DIAGNOSIS — D235 Other benign neoplasm of skin of trunk: Secondary | ICD-10-CM | POA: Diagnosis not present

## 2013-12-05 DIAGNOSIS — L820 Inflamed seborrheic keratosis: Secondary | ICD-10-CM | POA: Diagnosis not present

## 2013-12-05 DIAGNOSIS — B029 Zoster without complications: Secondary | ICD-10-CM | POA: Diagnosis not present

## 2013-12-07 DIAGNOSIS — E069 Thyroiditis, unspecified: Secondary | ICD-10-CM | POA: Diagnosis not present

## 2013-12-07 DIAGNOSIS — C4360 Malignant melanoma of unspecified upper limb, including shoulder: Secondary | ICD-10-CM | POA: Diagnosis not present

## 2013-12-07 DIAGNOSIS — C439 Malignant melanoma of skin, unspecified: Secondary | ICD-10-CM | POA: Diagnosis not present

## 2013-12-07 DIAGNOSIS — Z1329 Encounter for screening for other suspected endocrine disorder: Secondary | ICD-10-CM | POA: Diagnosis not present

## 2013-12-07 DIAGNOSIS — Z5112 Encounter for antineoplastic immunotherapy: Secondary | ICD-10-CM | POA: Diagnosis not present

## 2013-12-10 DIAGNOSIS — Z1329 Encounter for screening for other suspected endocrine disorder: Secondary | ICD-10-CM | POA: Diagnosis not present

## 2013-12-10 DIAGNOSIS — C439 Malignant melanoma of skin, unspecified: Secondary | ICD-10-CM | POA: Diagnosis not present

## 2013-12-10 DIAGNOSIS — E069 Thyroiditis, unspecified: Secondary | ICD-10-CM | POA: Diagnosis not present

## 2013-12-10 DIAGNOSIS — C4360 Malignant melanoma of unspecified upper limb, including shoulder: Secondary | ICD-10-CM | POA: Diagnosis not present

## 2013-12-14 DIAGNOSIS — C4360 Malignant melanoma of unspecified upper limb, including shoulder: Secondary | ICD-10-CM | POA: Diagnosis not present

## 2013-12-14 DIAGNOSIS — Z5111 Encounter for antineoplastic chemotherapy: Secondary | ICD-10-CM | POA: Diagnosis not present

## 2013-12-14 DIAGNOSIS — R110 Nausea: Secondary | ICD-10-CM | POA: Diagnosis not present

## 2013-12-18 ENCOUNTER — Telehealth: Payer: Self-pay | Admitting: Oncology

## 2013-12-18 NOTE — Telephone Encounter (Signed)
Medical records faxed to [facility].  Slides and scans will be fedex'ed

## 2013-12-24 DIAGNOSIS — D0590 Unspecified type of carcinoma in situ of unspecified breast: Secondary | ICD-10-CM | POA: Diagnosis not present

## 2013-12-24 DIAGNOSIS — H1030 Unspecified acute conjunctivitis, unspecified eye: Secondary | ICD-10-CM | POA: Insufficient documentation

## 2013-12-24 DIAGNOSIS — C50919 Malignant neoplasm of unspecified site of unspecified female breast: Secondary | ICD-10-CM | POA: Diagnosis not present

## 2013-12-24 DIAGNOSIS — C439 Malignant melanoma of skin, unspecified: Secondary | ICD-10-CM | POA: Diagnosis not present

## 2013-12-24 DIAGNOSIS — H1189 Other specified disorders of conjunctiva: Secondary | ICD-10-CM | POA: Diagnosis not present

## 2013-12-24 DIAGNOSIS — H109 Unspecified conjunctivitis: Secondary | ICD-10-CM | POA: Diagnosis not present

## 2014-01-04 DIAGNOSIS — E060 Acute thyroiditis: Secondary | ICD-10-CM | POA: Diagnosis not present

## 2014-01-04 DIAGNOSIS — C4360 Malignant melanoma of unspecified upper limb, including shoulder: Secondary | ICD-10-CM | POA: Diagnosis not present

## 2014-01-04 DIAGNOSIS — Z5111 Encounter for antineoplastic chemotherapy: Secondary | ICD-10-CM | POA: Diagnosis not present

## 2014-01-04 DIAGNOSIS — H1030 Unspecified acute conjunctivitis, unspecified eye: Secondary | ICD-10-CM | POA: Diagnosis not present

## 2014-01-08 DIAGNOSIS — Z9849 Cataract extraction status, unspecified eye: Secondary | ICD-10-CM | POA: Diagnosis not present

## 2014-01-08 DIAGNOSIS — Z961 Presence of intraocular lens: Secondary | ICD-10-CM | POA: Diagnosis not present

## 2014-01-08 DIAGNOSIS — Z9889 Other specified postprocedural states: Secondary | ICD-10-CM | POA: Diagnosis not present

## 2014-01-08 DIAGNOSIS — H1030 Unspecified acute conjunctivitis, unspecified eye: Secondary | ICD-10-CM | POA: Diagnosis not present

## 2014-01-08 DIAGNOSIS — D3130 Benign neoplasm of unspecified choroid: Secondary | ICD-10-CM | POA: Diagnosis not present

## 2014-01-15 DIAGNOSIS — H109 Unspecified conjunctivitis: Secondary | ICD-10-CM | POA: Diagnosis not present

## 2014-01-15 DIAGNOSIS — Z9889 Other specified postprocedural states: Secondary | ICD-10-CM | POA: Diagnosis not present

## 2014-01-15 DIAGNOSIS — H4040X0 Glaucoma secondary to eye inflammation, unspecified eye, stage unspecified: Secondary | ICD-10-CM | POA: Diagnosis not present

## 2014-01-15 DIAGNOSIS — H409 Unspecified glaucoma: Secondary | ICD-10-CM | POA: Diagnosis not present

## 2014-01-15 DIAGNOSIS — Z9849 Cataract extraction status, unspecified eye: Secondary | ICD-10-CM | POA: Diagnosis not present

## 2014-01-15 DIAGNOSIS — D3130 Benign neoplasm of unspecified choroid: Secondary | ICD-10-CM | POA: Diagnosis not present

## 2014-01-15 DIAGNOSIS — Z961 Presence of intraocular lens: Secondary | ICD-10-CM | POA: Diagnosis not present

## 2014-01-22 ENCOUNTER — Other Ambulatory Visit: Payer: Self-pay | Admitting: *Deleted

## 2014-01-22 DIAGNOSIS — M5431 Sciatica, right side: Secondary | ICD-10-CM

## 2014-01-22 MED ORDER — HYDROCODONE-ACETAMINOPHEN 10-325 MG PO TABS: ORAL_TABLET | ORAL | Status: DC

## 2014-01-22 NOTE — Telephone Encounter (Signed)
Patient Requested 

## 2014-01-29 DIAGNOSIS — G4700 Insomnia, unspecified: Secondary | ICD-10-CM | POA: Diagnosis not present

## 2014-01-29 DIAGNOSIS — C439 Malignant melanoma of skin, unspecified: Secondary | ICD-10-CM | POA: Diagnosis not present

## 2014-01-29 DIAGNOSIS — C4360 Malignant melanoma of unspecified upper limb, including shoulder: Secondary | ICD-10-CM | POA: Diagnosis not present

## 2014-01-29 DIAGNOSIS — Z5111 Encounter for antineoplastic chemotherapy: Secondary | ICD-10-CM | POA: Diagnosis not present

## 2014-02-01 DIAGNOSIS — K222 Esophageal obstruction: Secondary | ICD-10-CM | POA: Diagnosis not present

## 2014-02-01 DIAGNOSIS — J398 Other specified diseases of upper respiratory tract: Secondary | ICD-10-CM | POA: Diagnosis not present

## 2014-02-01 DIAGNOSIS — Z171 Estrogen receptor negative status [ER-]: Secondary | ICD-10-CM | POA: Diagnosis not present

## 2014-02-01 DIAGNOSIS — Z9889 Other specified postprocedural states: Secondary | ICD-10-CM | POA: Diagnosis not present

## 2014-02-01 DIAGNOSIS — M949 Disorder of cartilage, unspecified: Secondary | ICD-10-CM | POA: Diagnosis not present

## 2014-02-01 DIAGNOSIS — J988 Other specified respiratory disorders: Secondary | ICD-10-CM | POA: Diagnosis not present

## 2014-02-01 DIAGNOSIS — M899 Disorder of bone, unspecified: Secondary | ICD-10-CM | POA: Diagnosis not present

## 2014-02-01 DIAGNOSIS — H409 Unspecified glaucoma: Secondary | ICD-10-CM | POA: Diagnosis not present

## 2014-02-01 DIAGNOSIS — C50919 Malignant neoplasm of unspecified site of unspecified female breast: Secondary | ICD-10-CM | POA: Diagnosis not present

## 2014-02-01 DIAGNOSIS — Z85820 Personal history of malignant melanoma of skin: Secondary | ICD-10-CM | POA: Diagnosis not present

## 2014-02-01 DIAGNOSIS — C4360 Malignant melanoma of unspecified upper limb, including shoulder: Secondary | ICD-10-CM | POA: Diagnosis not present

## 2014-02-01 DIAGNOSIS — C50911 Malignant neoplasm of unspecified site of right female breast: Secondary | ICD-10-CM | POA: Insufficient documentation

## 2014-02-04 ENCOUNTER — Telehealth: Payer: Self-pay | Admitting: Oncology

## 2014-02-04 NOTE — Telephone Encounter (Signed)
per pt CX appt transferring care to [facility]

## 2014-02-07 ENCOUNTER — Other Ambulatory Visit: Payer: Medicare Other

## 2014-02-14 ENCOUNTER — Ambulatory Visit: Payer: Medicare Other | Admitting: Oncology

## 2014-02-19 DIAGNOSIS — C4360 Malignant melanoma of unspecified upper limb, including shoulder: Secondary | ICD-10-CM | POA: Diagnosis not present

## 2014-02-19 DIAGNOSIS — G4700 Insomnia, unspecified: Secondary | ICD-10-CM | POA: Diagnosis not present

## 2014-02-21 DIAGNOSIS — Z961 Presence of intraocular lens: Secondary | ICD-10-CM | POA: Diagnosis not present

## 2014-02-21 DIAGNOSIS — Z9889 Other specified postprocedural states: Secondary | ICD-10-CM | POA: Diagnosis not present

## 2014-02-21 DIAGNOSIS — Z9849 Cataract extraction status, unspecified eye: Secondary | ICD-10-CM | POA: Diagnosis not present

## 2014-02-21 DIAGNOSIS — H109 Unspecified conjunctivitis: Secondary | ICD-10-CM | POA: Diagnosis not present

## 2014-02-21 DIAGNOSIS — D3130 Benign neoplasm of unspecified choroid: Secondary | ICD-10-CM | POA: Diagnosis not present

## 2014-02-21 DIAGNOSIS — B0051 Herpesviral iridocyclitis: Secondary | ICD-10-CM | POA: Diagnosis not present

## 2014-02-27 DIAGNOSIS — D485 Neoplasm of uncertain behavior of skin: Secondary | ICD-10-CM | POA: Diagnosis not present

## 2014-02-27 DIAGNOSIS — C439 Malignant melanoma of skin, unspecified: Secondary | ICD-10-CM | POA: Diagnosis not present

## 2014-02-27 DIAGNOSIS — C4360 Malignant melanoma of unspecified upper limb, including shoulder: Secondary | ICD-10-CM | POA: Diagnosis not present

## 2014-02-27 DIAGNOSIS — L439 Lichen planus, unspecified: Secondary | ICD-10-CM | POA: Diagnosis not present

## 2014-02-27 DIAGNOSIS — Z85820 Personal history of malignant melanoma of skin: Secondary | ICD-10-CM | POA: Diagnosis not present

## 2014-03-22 DIAGNOSIS — Z0389 Encounter for observation for other suspected diseases and conditions ruled out: Secondary | ICD-10-CM | POA: Diagnosis not present

## 2014-03-22 DIAGNOSIS — Z1289 Encounter for screening for malignant neoplasm of other sites: Secondary | ICD-10-CM | POA: Diagnosis not present

## 2014-03-22 DIAGNOSIS — C4360 Malignant melanoma of unspecified upper limb, including shoulder: Secondary | ICD-10-CM | POA: Diagnosis not present

## 2014-03-22 DIAGNOSIS — R229 Localized swelling, mass and lump, unspecified: Secondary | ICD-10-CM | POA: Diagnosis not present

## 2014-03-26 DIAGNOSIS — Z1289 Encounter for screening for malignant neoplasm of other sites: Secondary | ICD-10-CM | POA: Diagnosis not present

## 2014-03-26 DIAGNOSIS — R5381 Other malaise: Secondary | ICD-10-CM | POA: Diagnosis not present

## 2014-03-26 DIAGNOSIS — K5730 Diverticulosis of large intestine without perforation or abscess without bleeding: Secondary | ICD-10-CM | POA: Diagnosis not present

## 2014-03-26 DIAGNOSIS — G4700 Insomnia, unspecified: Secondary | ICD-10-CM | POA: Diagnosis not present

## 2014-03-26 DIAGNOSIS — R918 Other nonspecific abnormal finding of lung field: Secondary | ICD-10-CM | POA: Diagnosis not present

## 2014-03-26 DIAGNOSIS — R5383 Other fatigue: Secondary | ICD-10-CM | POA: Diagnosis not present

## 2014-03-26 DIAGNOSIS — C439 Malignant melanoma of skin, unspecified: Secondary | ICD-10-CM | POA: Diagnosis not present

## 2014-03-26 DIAGNOSIS — C4360 Malignant melanoma of unspecified upper limb, including shoulder: Secondary | ICD-10-CM | POA: Diagnosis not present

## 2014-04-05 DIAGNOSIS — H109 Unspecified conjunctivitis: Secondary | ICD-10-CM | POA: Diagnosis not present

## 2014-04-05 DIAGNOSIS — C439 Malignant melanoma of skin, unspecified: Secondary | ICD-10-CM | POA: Diagnosis not present

## 2014-04-05 DIAGNOSIS — Z9889 Other specified postprocedural states: Secondary | ICD-10-CM | POA: Diagnosis not present

## 2014-04-05 DIAGNOSIS — H318 Other specified disorders of choroid: Secondary | ICD-10-CM | POA: Diagnosis not present

## 2014-04-05 DIAGNOSIS — H409 Unspecified glaucoma: Secondary | ICD-10-CM | POA: Diagnosis not present

## 2014-04-05 DIAGNOSIS — Z961 Presence of intraocular lens: Secondary | ICD-10-CM | POA: Diagnosis not present

## 2014-04-05 DIAGNOSIS — C792 Secondary malignant neoplasm of skin: Secondary | ICD-10-CM | POA: Diagnosis not present

## 2014-04-05 DIAGNOSIS — Z8669 Personal history of other diseases of the nervous system and sense organs: Secondary | ICD-10-CM | POA: Diagnosis not present

## 2014-04-05 DIAGNOSIS — D3130 Benign neoplasm of unspecified choroid: Secondary | ICD-10-CM | POA: Diagnosis not present

## 2014-04-05 DIAGNOSIS — B0051 Herpesviral iridocyclitis: Secondary | ICD-10-CM | POA: Diagnosis not present

## 2014-04-05 DIAGNOSIS — H4040X0 Glaucoma secondary to eye inflammation, unspecified eye, stage unspecified: Secondary | ICD-10-CM | POA: Diagnosis not present

## 2014-04-05 DIAGNOSIS — H10439 Chronic follicular conjunctivitis, unspecified eye: Secondary | ICD-10-CM | POA: Diagnosis not present

## 2014-04-16 ENCOUNTER — Other Ambulatory Visit: Payer: Self-pay | Admitting: *Deleted

## 2014-04-16 DIAGNOSIS — M5431 Sciatica, right side: Secondary | ICD-10-CM

## 2014-04-16 MED ORDER — HYDROCODONE-ACETAMINOPHEN 10-325 MG PO TABS: ORAL_TABLET | ORAL | Status: DC

## 2014-04-16 NOTE — Telephone Encounter (Signed)
Patient Requested and will pick up 

## 2014-04-22 ENCOUNTER — Other Ambulatory Visit: Payer: Medicare Other

## 2014-04-22 DIAGNOSIS — C50519 Malignant neoplasm of lower-outer quadrant of unspecified female breast: Secondary | ICD-10-CM | POA: Diagnosis not present

## 2014-04-22 DIAGNOSIS — C50511 Malignant neoplasm of lower-outer quadrant of right female breast: Secondary | ICD-10-CM

## 2014-04-22 DIAGNOSIS — E785 Hyperlipidemia, unspecified: Secondary | ICD-10-CM | POA: Diagnosis not present

## 2014-04-23 LAB — COMPREHENSIVE METABOLIC PANEL
ALBUMIN: 4.1 g/dL (ref 3.6–4.8)
ALT: 19 IU/L (ref 0–32)
AST: 18 IU/L (ref 0–40)
Albumin/Globulin Ratio: 1.5 (ref 1.1–2.5)
Alkaline Phosphatase: 73 IU/L (ref 39–117)
BUN/Creatinine Ratio: 13 (ref 11–26)
BUN: 9 mg/dL (ref 8–27)
CALCIUM: 9.6 mg/dL (ref 8.7–10.3)
CO2: 26 mmol/L (ref 18–29)
CREATININE: 0.68 mg/dL (ref 0.57–1.00)
Chloride: 98 mmol/L (ref 97–108)
GFR calc Af Amer: 104 mL/min/{1.73_m2} (ref >59)
GFR calc non Af Amer: 90 mL/min/{1.73_m2} (ref >59)
Globulin, Total: 2.7 g/dL (ref 1.5–4.5)
Glucose: 109 mg/dL — ABNORMAL HIGH (ref 65–99)
Potassium: 4.5 mmol/L (ref 3.5–5.2)
Sodium: 138 mmol/L (ref 134–144)
TOTAL PROTEIN: 6.8 g/dL (ref 6.0–8.5)
Total Bilirubin: 0.3 mg/dL (ref 0.0–1.2)

## 2014-04-23 LAB — LIPID PANEL
CHOL/HDL RATIO: 4.1 ratio (ref 0.0–4.4)
Cholesterol, Total: 240 mg/dL — ABNORMAL HIGH (ref 100–199)
HDL: 58 mg/dL (ref >39)
LDL Calculated: 146 mg/dL — ABNORMAL HIGH (ref 0–99)
Triglycerides: 182 mg/dL — ABNORMAL HIGH (ref 0–149)
VLDL Cholesterol Cal: 36 mg/dL (ref 5–40)

## 2014-04-24 ENCOUNTER — Ambulatory Visit (INDEPENDENT_AMBULATORY_CARE_PROVIDER_SITE_OTHER): Payer: Medicare Other | Admitting: Internal Medicine

## 2014-04-24 ENCOUNTER — Encounter: Payer: Self-pay | Admitting: Internal Medicine

## 2014-04-24 VITALS — BP 118/80 | HR 74 | Temp 98.6°F | Resp 10 | Ht 64.0 in | Wt 226.0 lb

## 2014-04-24 DIAGNOSIS — C4360 Malignant melanoma of unspecified upper limb, including shoulder: Secondary | ICD-10-CM

## 2014-04-24 DIAGNOSIS — E559 Vitamin D deficiency, unspecified: Secondary | ICD-10-CM | POA: Diagnosis not present

## 2014-04-24 DIAGNOSIS — K219 Gastro-esophageal reflux disease without esophagitis: Secondary | ICD-10-CM | POA: Diagnosis not present

## 2014-04-24 DIAGNOSIS — K222 Esophageal obstruction: Secondary | ICD-10-CM | POA: Diagnosis not present

## 2014-04-24 DIAGNOSIS — E785 Hyperlipidemia, unspecified: Secondary | ICD-10-CM

## 2014-04-24 DIAGNOSIS — E669 Obesity, unspecified: Secondary | ICD-10-CM

## 2014-04-24 DIAGNOSIS — C50511 Malignant neoplasm of lower-outer quadrant of right female breast: Secondary | ICD-10-CM

## 2014-04-24 DIAGNOSIS — C4362 Malignant melanoma of left upper limb, including shoulder: Secondary | ICD-10-CM

## 2014-04-24 DIAGNOSIS — C50519 Malignant neoplasm of lower-outer quadrant of unspecified female breast: Secondary | ICD-10-CM | POA: Diagnosis not present

## 2014-04-24 DIAGNOSIS — Z23 Encounter for immunization: Secondary | ICD-10-CM | POA: Diagnosis not present

## 2014-04-24 MED ORDER — VITAMIN D3 1.25 MG (50000 UT) PO CAPS: ORAL_CAPSULE | ORAL | Status: DC

## 2014-04-24 NOTE — Progress Notes (Signed)
Passed clock drawing 

## 2014-04-24 NOTE — Progress Notes (Signed)
Patient ID: [name], female   DOB: 23-Feb-1946, 68 y.o.   MRN: [id number]    Location:    [facility]   Place of Service:   OFFICE  Extended Emergency Contact Information Primary Emergency Contact: Hassing,Terry Address: [address], [place] Phone: [phone] Work Phone: [phone] Mobile Phone: [phone] Relation: Son   Chief Complaint  Patient presents with  . Annual Exam    Yearly check-up, no pap, discuss labs (copy printed)   . MMSE    29/30, Passed clock drawing     HPI:   Malignant melanoma of arm, left - Following her visit in March 2015, she saw ortho about an enlarging mass of the right wrist. It has been determined to be a melanoma, not a ganglion cyst  Breast cancer of lower-outer quadrant of right female breast in 2010- no relapse. Has finished 5 years of Arimidex.  Obesity, unspecified: unchanged. Not losing weight.  Gastroesophageal reflux disease without esophagitis: occasional heartburn  Esophageal stenosis: denies food sticking in the chest  Hyperlipidemia - LDL high at 148  Unspecified vitamin D deficiency - using supplement  Need for prophylactic vaccination and inoculation against influenza    Past Medical History  Diagnosis Date  . Hiatal hernia   . Diverticulosis   . Cancer     breast   . Shingles     in eyes  . GERD (gastroesophageal reflux disease)   . Hyperlipidemia   . Other abnormal blood chemistry   . Pain in joint, pelvic region and thigh   . Trigger finger (acquired)   . Dysuria   . Asymptomatic varicose veins   . Unspecified cataract   . Malignant neoplasm of breast (female), unspecified site   . Stricture and stenosis of esophagus   . Unspecified glaucoma   . Migraine without aura, without mention of intractable migraine without mention of status migrainosus     Past Surgical History  Procedure Laterality Date  . Esophageal dilation    . Appendectomy   1973    Dr. Linzie Collin  . Breast surgery Right 08/29/2008    Dr. Marcello Moores Cornett    History   Social History  . Marital Status: Widowed    Spouse Name: N/A    Number of Children: N/A  . Years of Education: N/A   Occupational History  . Not on file.   Social History Main Topics  . Smoking status: Never Smoker   . Smokeless tobacco: Not on file  . Alcohol Use: Yes  . Drug Use: No  . Sexual Activity: Not on file   Other Topics Concern  . Not on file   Social History Narrative  . No narrative on file     reports that she has never smoked. She does not have any smokeless tobacco history on file. She reports that she drinks alcohol. She reports that she does not use illicit drugs.  Immunization History  Administered Date(s) Administered  . Influenza Whole 07/26/2010  . Influenza-Unspecified 04/25/2013  . Pneumococcal Conjugate-13 07/26/2008  . Tdap 07/26/2008  . Zoster 10/17/2013    Allergies  Allergen Reactions  . Adhesive [Tape] Dermatitis  . Betimol [Timolol Maleate]     Allergic to preservatives   . Cosopt [Dorzolamide Hcl-Timolol Mal] Other (See Comments)    Turns eye red, increases eye pressure  . Penicillins   . Tamoxifen Other (See Comments)    Vaginal  bleeding  . Thimerosal     Makes pt eye red     Medications: Patient's Medications  New Prescriptions   No medications on file  Previous Medications   ALENDRONATE (FOSAMAX) 35 MG TABLET    TAKE ONE TABLET BY MOUTH EVERY 7 DAYS. TAKE WITH A FULL GLASS OF WATER ON AN EMPTY STOMACH.   B COMPLEX VITAMINS TABLET    Take 1 tablet by mouth daily.     BETAXOLOL (BETOPTIC-S) 0.5 % OPHTHALMIC SUSPENSION    Place 1 drop into the right eye 2 (two) times daily.   CALCIUM CITRATE-VITAMIN D 250-200 MG-UNIT TABS    Take 250 mg by mouth 2 (two) times daily.   CHOLECALCIFEROL (VITAMIN D3) 2000 UNITS CHEW    Chew 2,000 Units by mouth daily. Take 1 tablet daily for vitamin D supplement.   HYDROCODONE-ACETAMINOPHEN (NORCO)  10-325 MG PER TABLET    Take one tablet by mouth every 8 hours as needed for pain   LORAZEPAM (ATIVAN) 1 MG TABLET    1/2 by mouth at night   LOTEMAX 0.5 % OPHTHALMIC SUSPENSION    Place 1 drop into the right eye 2 (two) times daily.    MUPIROCIN OINTMENT (BACTROBAN) 2 %    Place 1 application into the nose 2 (two) times daily.   OMEPRAZOLE (PRILOSEC) 20 MG CAPSULE    Take one tablet daily for reflux   PREDNISOLONE ACETATE (PRED FORTE) 1 % OPHTHALMIC SUSPENSION    Place 1 drop into the right eye 4 (four) times daily.   VALACYCLOVIR (VALTREX) 1000 MG TABLET    Take 1,000 mg by mouth daily.   Modified Medications   No medications on file  Discontinued Medications   ANASTROZOLE (ARIMIDEX) 1 MG TABLET    TAKE ONE TABLET BY MOUTH ONCE DAILY   OMEGA-3 FATTY ACIDS (FISH OIL) 1000 MG CAPS    Take one tablet once daily to lower cholesterol   VITAMIN C (ASCORBIC ACID) 500 MG TABLET    Take one tablet once daily for supplement   ZOSTER VACCINE LIVE, PF, (ZOSTAVAX) 28413 UNT/0.65ML INJECTION    Inject 19,400 Units into the skin once.     Review of Systems  Constitutional: Positive for appetite change. Negative for fever and chills.       Decreased appetite  HENT: Positive for dental problem. Negative for congestion, ear pain and rhinorrhea.   Eyes:       History of glaucoma. History of cataracts. History of a torn retina in the right eye 03/06/2010 associated with a dense vitreous hemorrhage. She had laser therapy and cryotherapy to the right. History of herpes zoster of the right. Takes Valtrex continuously. Ptosis right eye followed shingles.  Respiratory: Negative for cough and chest tightness.   Cardiovascular: Negative for chest pain, palpitations and leg swelling.  Gastrointestinal: Negative for diarrhea.       History mild dysphagia. Food seems to hang in the chest.  Genitourinary: Negative for dysuria, urgency, frequency, hematuria, flank pain, enuresis and genital sores.       History of  ovarian cysts CT scan November 2002.  Musculoskeletal: Positive for myalgias.       Chronic back pains. Pain down the right leg suggestive of sciatica. Mild arthralgias in her hips and knees. Stable balance and gait. Enlarging ganglion cyst of the right wrist.  Skin: Negative for pallor, rash and wound.       SK right hip and other places. Cystic mass of the right wrist  that has been labeled melanoma.  Neurological: Negative for headaches.  Hematological: Negative.   Psychiatric/Behavioral: Negative.  Negative for behavioral problems, confusion, sleep disturbance and agitation.    Filed Vitals:   04/24/14 1519  BP: 118/80  Pulse: 74  Temp: 98.6 F (37 C)  TempSrc: Oral  Resp: 10  Height: 5\' 4"  (1.626 m)  Weight: 226 lb (102.513 kg)  SpO2: 99%   Body mass index is 38.77 kg/(m^2).  Physical Exam  Constitutional: She is oriented to person, place, and time. She appears well-developed and well-nourished. No distress.  Moderately overweight.  HENT:  Head: Normocephalic and atraumatic.  Right Ear: External ear normal.  Left Ear: External ear normal.  Nose: Nose normal.  Mouth/Throat: Oropharynx is clear and moist.  Dentures.  Eyes: Conjunctivae and EOM are normal. Pupils are equal, round, and reactive to light.  Ptosis of the right eye.  Neck: Neck supple. No JVD present. No tracheal deviation present. No thyromegaly present.  Cardiovascular: Normal rate, normal heart sounds and intact distal pulses.  Exam reveals no gallop and no friction rub.   No murmur heard. Pulmonary/Chest: Breath sounds normal. No respiratory distress. She has no wheezes. She has no rales.  Abdominal: Bowel sounds are normal. She exhibits no distension and no mass. There is no tenderness.  Musculoskeletal: Normal range of motion. She exhibits no edema or tenderness.  Cystic mass of the medial side of the right wrist.  Lymphadenopathy:    She has no cervical adenopathy.  Neurological: She is alert and  oriented to person, place, and time. She has normal reflexes.  04/24/14 MMSE 29/30. Passed clock drawing.  Skin: No rash noted. No erythema. No pallor.  SK right hip. Lipoma of left axilla medially. Firm cystic feeling Mass right wrist ulnar aspect.  Psychiatric: She has a normal mood and affect. Her behavior is normal. Judgment and thought content normal.     Labs reviewed: Appointment on 04/22/2014  Component Date Value Ref Range Status  . Cholesterol, Total 04/22/2014 240* 100 - 199 mg/dL Final  . Triglycerides 04/22/2014 182* 0 - 149 mg/dL Final  . HDL 04/22/2014 58  >39 mg/dL Final   Comment: According to ATP-III Guidelines, HDL-C >59 mg/dL is considered a                          negative risk factor for CHD.  Marland Kitchen VLDL Cholesterol Cal 04/22/2014 36  5 - 40 mg/dL Final  . LDL Calculated 04/22/2014 146* 0 - 99 mg/dL Final  . Chol/HDL Ratio 04/22/2014 4.1  0.0 - 4.4 ratio units Final   Comment:                                   T. Chol/HDL Ratio                                                                      Men  Women  1/2 Avg.Risk  3.4    3.3                                                            Avg.Risk  5.0    4.4                                                         2X Avg.Risk  9.6    7.1                                                         3X Avg.Risk 23.4   11.0  . Glucose 04/22/2014 109* 65 - 99 mg/dL Final  . BUN 04/22/2014 9  8 - 27 mg/dL Final  . Creatinine, Ser 04/22/2014 0.68  0.57 - 1.00 mg/dL Final  . GFR calc non Af Amer 04/22/2014 90  >59 mL/min/1.73 Final  . GFR calc Af Amer 04/22/2014 104  >59 mL/min/1.73 Final  . BUN/Creatinine Ratio 04/22/2014 13  11 - 26 Final  . Sodium 04/22/2014 138  134 - 144 mmol/L Final  . Potassium 04/22/2014 4.5  3.5 - 5.2 mmol/L Final  . Chloride 04/22/2014 98  97 - 108 mmol/L Final  . CO2 04/22/2014 26  18 - 29 mmol/L Final  . Calcium 04/22/2014 9.6  8.7 - 10.3  mg/dL Final  . Total Protein 04/22/2014 6.8  6.0 - 8.5 g/dL Final  . Albumin 04/22/2014 4.1  3.6 - 4.8 g/dL Final  . Globulin, Total 04/22/2014 2.7  1.5 - 4.5 g/dL Final  . Albumin/Globulin Ratio 04/22/2014 1.5  1.1 - 2.5 Final  . Total Bilirubin 04/22/2014 0.3  0.0 - 1.2 mg/dL Final  . Alkaline Phosphatase 04/22/2014 73  39 - 117 IU/L Final  . AST 04/22/2014 18  0 - 40 IU/L Final  . ALT 04/22/2014 19  0 - 32 IU/L Final    Assessment/Plan  1. Malignant melanoma of arm, left Seeing oncology. Has had 4 treatments of IV ipilimumab. - Comprehensive metabolic panel; Future  2. Breast cancer of lower-outer quadrant of right female breast - Comprehensive metabolic panel; Future  3. Obesity, unspecified encouraged weight loss  4. Gastroesophageal reflux disease without esophagitis controlled  5. Esophageal stenosis asymptomatic  6. Hyperlipidemia - Lipid panel; Future  7. Unspecified vitamin D deficiency - Cholecalciferol (VITAMIN D3) 50000 UNITS CAPS; One weekly for vitamin D supplement  Dispense: 12 capsule; Refill: 5  8. Need for prophylactic vaccination and inoculation against influenza vaccine administered

## 2014-05-06 DIAGNOSIS — Z48810 Encounter for surgical aftercare following surgery on the sense organs: Secondary | ICD-10-CM | POA: Diagnosis not present

## 2014-05-06 DIAGNOSIS — H10433 Chronic follicular conjunctivitis, bilateral: Secondary | ICD-10-CM | POA: Diagnosis not present

## 2014-05-06 DIAGNOSIS — Z9889 Other specified postprocedural states: Secondary | ICD-10-CM | POA: Diagnosis not present

## 2014-05-06 DIAGNOSIS — Z9841 Cataract extraction status, right eye: Secondary | ICD-10-CM | POA: Diagnosis not present

## 2014-05-06 DIAGNOSIS — D3131 Benign neoplasm of right choroid: Secondary | ICD-10-CM | POA: Diagnosis not present

## 2014-05-06 DIAGNOSIS — Z961 Presence of intraocular lens: Secondary | ICD-10-CM | POA: Diagnosis not present

## 2014-06-08 DIAGNOSIS — M545 Low back pain: Secondary | ICD-10-CM | POA: Diagnosis not present

## 2014-06-08 DIAGNOSIS — N390 Urinary tract infection, site not specified: Secondary | ICD-10-CM | POA: Diagnosis not present

## 2014-06-11 ENCOUNTER — Other Ambulatory Visit: Payer: Self-pay | Admitting: *Deleted

## 2014-06-11 DIAGNOSIS — M5431 Sciatica, right side: Secondary | ICD-10-CM

## 2014-06-11 MED ORDER — HYDROCODONE-ACETAMINOPHEN 10-325 MG PO TABS: ORAL_TABLET | ORAL | Status: DC

## 2014-06-11 NOTE — Telephone Encounter (Signed)
Patient Requested and will pick up 

## 2014-06-17 DIAGNOSIS — C4360 Malignant melanoma of unspecified upper limb, including shoulder: Secondary | ICD-10-CM | POA: Diagnosis not present

## 2014-06-17 DIAGNOSIS — Z853 Personal history of malignant neoplasm of breast: Secondary | ICD-10-CM | POA: Diagnosis not present

## 2014-06-25 DIAGNOSIS — C799 Secondary malignant neoplasm of unspecified site: Secondary | ICD-10-CM | POA: Diagnosis not present

## 2014-06-25 DIAGNOSIS — C4361 Malignant melanoma of right upper limb, including shoulder: Secondary | ICD-10-CM | POA: Diagnosis not present

## 2014-06-25 DIAGNOSIS — Z0389 Encounter for observation for other suspected diseases and conditions ruled out: Secondary | ICD-10-CM | POA: Diagnosis not present

## 2014-06-25 DIAGNOSIS — C50911 Malignant neoplasm of unspecified site of right female breast: Secondary | ICD-10-CM | POA: Diagnosis not present

## 2014-06-27 DIAGNOSIS — Z9841 Cataract extraction status, right eye: Secondary | ICD-10-CM | POA: Diagnosis not present

## 2014-06-27 DIAGNOSIS — H10401 Unspecified chronic conjunctivitis, right eye: Secondary | ICD-10-CM | POA: Diagnosis not present

## 2014-06-27 DIAGNOSIS — B0051 Herpesviral iridocyclitis: Secondary | ICD-10-CM | POA: Diagnosis not present

## 2014-07-09 DIAGNOSIS — R5383 Other fatigue: Secondary | ICD-10-CM | POA: Insufficient documentation

## 2014-07-25 DIAGNOSIS — Z853 Personal history of malignant neoplasm of breast: Secondary | ICD-10-CM | POA: Diagnosis not present

## 2014-08-09 DIAGNOSIS — C50911 Malignant neoplasm of unspecified site of right female breast: Secondary | ICD-10-CM | POA: Diagnosis not present

## 2014-08-09 DIAGNOSIS — Z79899 Other long term (current) drug therapy: Secondary | ICD-10-CM | POA: Diagnosis not present

## 2014-08-09 DIAGNOSIS — M8580 Other specified disorders of bone density and structure, unspecified site: Secondary | ICD-10-CM | POA: Diagnosis not present

## 2014-08-09 DIAGNOSIS — C439 Malignant melanoma of skin, unspecified: Secondary | ICD-10-CM | POA: Diagnosis not present

## 2014-08-24 DIAGNOSIS — R0989 Other specified symptoms and signs involving the circulatory and respiratory systems: Secondary | ICD-10-CM | POA: Diagnosis not present

## 2014-08-24 DIAGNOSIS — J209 Acute bronchitis, unspecified: Secondary | ICD-10-CM | POA: Diagnosis not present

## 2014-09-02 ENCOUNTER — Other Ambulatory Visit: Payer: Self-pay | Admitting: *Deleted

## 2014-09-02 DIAGNOSIS — M5431 Sciatica, right side: Secondary | ICD-10-CM

## 2014-09-02 MED ORDER — HYDROCODONE-ACETAMINOPHEN 10-325 MG PO TABS: ORAL_TABLET | ORAL | Status: DC

## 2014-09-02 NOTE — Telephone Encounter (Signed)
Patient Requested and will pick up 

## 2014-09-04 DIAGNOSIS — L853 Xerosis cutis: Secondary | ICD-10-CM | POA: Diagnosis not present

## 2014-09-04 DIAGNOSIS — L821 Other seborrheic keratosis: Secondary | ICD-10-CM | POA: Diagnosis not present

## 2014-09-04 DIAGNOSIS — L603 Nail dystrophy: Secondary | ICD-10-CM | POA: Diagnosis not present

## 2014-09-04 DIAGNOSIS — C4361 Malignant melanoma of right upper limb, including shoulder: Secondary | ICD-10-CM | POA: Diagnosis not present

## 2014-09-04 DIAGNOSIS — I781 Nevus, non-neoplastic: Secondary | ICD-10-CM | POA: Diagnosis not present

## 2014-09-04 DIAGNOSIS — D229 Melanocytic nevi, unspecified: Secondary | ICD-10-CM | POA: Diagnosis not present

## 2014-09-10 DIAGNOSIS — R1030 Lower abdominal pain, unspecified: Secondary | ICD-10-CM | POA: Diagnosis not present

## 2014-09-10 DIAGNOSIS — Z01419 Encounter for gynecological examination (general) (routine) without abnormal findings: Secondary | ICD-10-CM | POA: Diagnosis not present

## 2014-09-13 NOTE — Progress Notes (Signed)
none

## 2014-09-19 DIAGNOSIS — M66231 Spontaneous rupture of extensor tendons, right forearm: Secondary | ICD-10-CM | POA: Diagnosis not present

## 2014-09-19 DIAGNOSIS — C4361 Malignant melanoma of right upper limb, including shoulder: Secondary | ICD-10-CM | POA: Diagnosis not present

## 2014-09-24 DIAGNOSIS — B0051 Herpesviral iridocyclitis: Secondary | ICD-10-CM | POA: Diagnosis not present

## 2014-09-24 DIAGNOSIS — T50905S Adverse effect of unspecified drugs, medicaments and biological substances, sequela: Secondary | ICD-10-CM | POA: Diagnosis not present

## 2014-09-24 DIAGNOSIS — C4361 Malignant melanoma of right upper limb, including shoulder: Secondary | ICD-10-CM | POA: Diagnosis not present

## 2014-09-24 DIAGNOSIS — E064 Drug-induced thyroiditis: Secondary | ICD-10-CM | POA: Diagnosis not present

## 2014-09-24 DIAGNOSIS — R5383 Other fatigue: Secondary | ICD-10-CM | POA: Diagnosis not present

## 2014-09-30 DIAGNOSIS — H40041 Steroid responder, right eye: Secondary | ICD-10-CM | POA: Diagnosis not present

## 2014-09-30 DIAGNOSIS — B0051 Herpesviral iridocyclitis: Secondary | ICD-10-CM | POA: Diagnosis not present

## 2014-10-09 DIAGNOSIS — R1030 Lower abdominal pain, unspecified: Secondary | ICD-10-CM | POA: Diagnosis not present

## 2014-10-10 DIAGNOSIS — M810 Age-related osteoporosis without current pathological fracture: Secondary | ICD-10-CM | POA: Diagnosis not present

## 2014-10-15 DIAGNOSIS — N840 Polyp of corpus uteri: Secondary | ICD-10-CM | POA: Diagnosis not present

## 2014-10-21 ENCOUNTER — Other Ambulatory Visit: Payer: Medicare Other

## 2014-10-21 DIAGNOSIS — C50511 Malignant neoplasm of lower-outer quadrant of right female breast: Secondary | ICD-10-CM

## 2014-10-21 DIAGNOSIS — E785 Hyperlipidemia, unspecified: Secondary | ICD-10-CM | POA: Diagnosis not present

## 2014-10-21 DIAGNOSIS — C4362 Malignant melanoma of left upper limb, including shoulder: Secondary | ICD-10-CM | POA: Diagnosis not present

## 2014-10-22 LAB — COMPREHENSIVE METABOLIC PANEL
A/G RATIO: 1.5 (ref 1.1–2.5)
ALBUMIN: 4.1 g/dL (ref 3.6–4.8)
ALT: 20 IU/L (ref 0–32)
AST: 17 IU/L (ref 0–40)
Alkaline Phosphatase: 61 IU/L (ref 39–117)
BUN / CREAT RATIO: 24 (ref 11–26)
BUN: 15 mg/dL (ref 8–27)
Bilirubin Total: 0.3 mg/dL (ref 0.0–1.2)
CALCIUM: 9.4 mg/dL (ref 8.7–10.3)
CO2: 24 mmol/L (ref 18–29)
CREATININE: 0.62 mg/dL (ref 0.57–1.00)
Chloride: 101 mmol/L (ref 97–108)
GFR calc Af Amer: 106 mL/min/{1.73_m2} (ref >59)
GFR calc non Af Amer: 92 mL/min/{1.73_m2} (ref >59)
Globulin, Total: 2.8 g/dL (ref 1.5–4.5)
Glucose: 116 mg/dL — ABNORMAL HIGH (ref 65–99)
Potassium: 4.6 mmol/L (ref 3.5–5.2)
SODIUM: 140 mmol/L (ref 134–144)
Total Protein: 6.9 g/dL (ref 6.0–8.5)

## 2014-10-22 LAB — LIPID PANEL
Chol/HDL Ratio: 4.3 ratio units (ref 0.0–4.4)
Cholesterol, Total: 274 mg/dL — ABNORMAL HIGH (ref 100–199)
HDL: 63 mg/dL (ref >39)
LDL CALC: 174 mg/dL — AB (ref 0–99)
TRIGLYCERIDES: 187 mg/dL — AB (ref 0–149)
VLDL Cholesterol Cal: 37 mg/dL (ref 5–40)

## 2014-10-23 ENCOUNTER — Encounter: Payer: Self-pay | Admitting: Internal Medicine

## 2014-10-23 ENCOUNTER — Ambulatory Visit (INDEPENDENT_AMBULATORY_CARE_PROVIDER_SITE_OTHER): Payer: Medicare Other | Admitting: Internal Medicine

## 2014-10-23 VITALS — BP 142/78 | HR 85 | Temp 98.2°F | Resp 20 | Ht 64.0 in | Wt 233.2 lb

## 2014-10-23 DIAGNOSIS — Z23 Encounter for immunization: Secondary | ICD-10-CM

## 2014-10-23 DIAGNOSIS — C4361 Malignant melanoma of right upper limb, including shoulder: Secondary | ICD-10-CM

## 2014-10-23 DIAGNOSIS — E669 Obesity, unspecified: Secondary | ICD-10-CM | POA: Diagnosis not present

## 2014-10-23 DIAGNOSIS — N840 Polyp of corpus uteri: Secondary | ICD-10-CM

## 2014-10-23 DIAGNOSIS — R5382 Chronic fatigue, unspecified: Secondary | ICD-10-CM | POA: Diagnosis not present

## 2014-10-23 DIAGNOSIS — M5431 Sciatica, right side: Secondary | ICD-10-CM

## 2014-10-23 DIAGNOSIS — E119 Type 2 diabetes mellitus without complications: Secondary | ICD-10-CM | POA: Diagnosis not present

## 2014-10-23 DIAGNOSIS — C50511 Malignant neoplasm of lower-outer quadrant of right female breast: Secondary | ICD-10-CM

## 2014-10-23 DIAGNOSIS — E785 Hyperlipidemia, unspecified: Secondary | ICD-10-CM | POA: Diagnosis not present

## 2014-10-23 HISTORY — DX: Type 2 diabetes mellitus without complications: E11.9

## 2014-10-23 MED ORDER — HYDROCODONE-ACETAMINOPHEN 10-325 MG PO TABS: ORAL_TABLET | ORAL | Status: DC

## 2014-10-23 NOTE — Progress Notes (Signed)
Patient ID: [name], female   DOB: 07/03/1946, 69 y.o.   MRN: [id number]    Facility  [facility]    Place of Service:   OFFICE   Allergies  Allergen Reactions  . Adhesive [Tape] Dermatitis  . Betimol [Timolol Maleate]     Allergic to preservatives   . Cosopt [Dorzolamide Hcl-Timolol Mal] Other (See Comments)    Turns eye red, increases eye pressure  . Penicillins   . Tamoxifen Other (See Comments)    Vaginal bleeding  . Thimerosal     Makes pt eye red     Chief Complaint  Patient presents with  . Medical Management of Chronic Issues    Thyroid issues needs to talk about    HPI:  Malignant melanoma of arm, right: Previous notes indicated on the problem list that this biopsy-proven mass of the right wrist was on the left arm. This was incorrect. She has received ipilimumab treatment of her melanoma. Masses has not grown, but has not shrunk.  Hyperlipidemia: Rising LDL.  Obesity: Continues to gain weight. She relates a diet that would indicate she is not eating a lot, although she concentrates her calories mainly in her evening meal.  Breast cancer of lower-outer quadrant of right female breast: No relapse.  Type 2 diabetes mellitus without complication: Sugar gradually rising. Not under any treatment right now.  Chronic fatigue: Has increased over the last several months. Physical activity is not much.  Sciatica of right side - chronic and recurrent problem and benefits from  HYDROcodone-acetaminophen (NORCO) 10-325 MG per tablet  Patient reports a polyp in the uterus for which she is going to receive surgery in the near future.  Need for vaccination with 13-polyvalent pneumococcal conjugate vaccine - Plan: Pneumococcal conjugate vaccine 13-valent    Medications: Patient's Medications  New Prescriptions   No medications on file  Previous Medications   ALENDRONATE (FOSAMAX) 35 MG TABLET    TAKE ONE TABLET BY MOUTH EVERY 7 DAYS. TAKE WITH A FULL GLASS OF WATER ON AN  EMPTY STOMACH.   B COMPLEX VITAMINS TABLET    Take 1 tablet by mouth daily.     BETAXOLOL (BETOPTIC-S) 0.5 % OPHTHALMIC SUSPENSION    Place 1 drop into the right eye 2 (two) times daily.   CALCIUM CITRATE-VITAMIN D 250-200 MG-UNIT TABS    Take 250 mg by mouth 2 (two) times daily.   CHOLECALCIFEROL (VITAMIN D3) 50000 UNITS CAPS    One weekly for vitamin D supplement   CRANBERRY 1000 MG CAPS    Take 1 capsule by mouth daily.   LORAZEPAM (ATIVAN) 1 MG TABLET    1/2 by mouth at night   LOTEMAX 0.5 % OPHTHALMIC SUSPENSION    Place 1 drop into the right eye 2 (two) times daily.    LOTEPREDNOL (LOTEMAX) 0.5 % OPHTHALMIC SUSPENSION    1 drop.   MUPIROCIN OINTMENT (BACTROBAN) 2 %    Place 1 application into the nose 2 (two) times daily.   OMEPRAZOLE (PRILOSEC) 20 MG CAPSULE    Take one tablet daily for reflux   PREDNISOLONE ACETATE (PRED FORTE) 1 % OPHTHALMIC SUSPENSION    Place 1 drop into the right eye every other day.    PREDNISOLONE ACETATE (PRED FORTE) 1 % OPHTHALMIC SUSPENSION    1 drop.   VALACYCLOVIR (VALTREX) 1000 MG TABLET    Take 1,000 mg by mouth daily.   Modified Medications   Modified Medication Previous Medication   HYDROCODONE-ACETAMINOPHEN ([place]) 10-325  MG PER TABLET HYDROcodone-acetaminophen (NORCO) 10-325 MG per tablet      Take one tablet by mouth every 8 hours as needed for pain    Take one tablet by mouth every 8 hours as needed for pain  Discontinued Medications   No medications on file     Review of Systems  Constitutional: Positive for appetite change. Negative for fever and chills.       Decreased appetite  HENT: Positive for dental problem. Negative for congestion, ear pain and rhinorrhea.   Eyes:       History of glaucoma. History of cataracts. History of a torn retina in the right eye 03/06/2010 associated with a dense vitreous hemorrhage. She had laser therapy and cryotherapy to the right. History of herpes zoster of the right. Takes Valtrex continuously. Ptosis  right eye followed shingles.  Respiratory: Negative for cough and chest tightness.   Cardiovascular: Negative for chest pain, palpitations and leg swelling.  Gastrointestinal: Negative for diarrhea.       History mild dysphagia. Food seems to hang in the chest.  Genitourinary: Negative for dysuria, urgency, frequency, hematuria, flank pain, enuresis and genital sores.       History of ovarian cysts CT scan November 2002.  Musculoskeletal: Positive for myalgias.       Chronic back pains. Pain down the right leg suggestive of sciatica. Mild arthralgias in her hips and knees. Stable balance and gait.biopsy-proven malignant melanoma of the right wrist for which she has received chemotherapy with ipilimumab. Mass has not shrunk, but is not any larger.  Skin: Negative for pallor, rash and wound.       SK right hip and other places. Cystic mass of the right wrist that has been labeled melanoma.  Neurological: Negative for headaches.  Hematological: Negative.   Psychiatric/Behavioral: Negative.  Negative for behavioral problems, confusion, sleep disturbance and agitation.    Filed Vitals:   10/23/14 1255  BP: 142/78  Pulse: 85  Temp: 98.2 F (36.8 C)  TempSrc: Oral  Resp: 20  Height: 5\' 4"  (1.626 m)  Weight: 233 lb 3.2 oz (105.779 kg)  SpO2: 94%   Body mass index is 40.01 kg/(m^2).  Physical Exam  Constitutional: She is oriented to person, place, and time. She appears well-developed and well-nourished. No distress.  Moderately overweight.  HENT:  Head: Normocephalic and atraumatic.  Right Ear: External ear normal.  Left Ear: External ear normal.  Nose: Nose normal.  Mouth/Throat: Oropharynx is clear and moist.  Dentures.  Eyes: Conjunctivae and EOM are normal. Pupils are equal, round, and reactive to light.  Ptosis of the right eye.  Neck: Neck supple. No JVD present. No tracheal deviation present. No thyromegaly present.  Cardiovascular: Normal rate, normal heart sounds and  intact distal pulses.  Exam reveals no gallop and no friction rub.   No murmur heard. Pulmonary/Chest: Breath sounds normal. No respiratory distress. She has no wheezes. She has no rales.  Abdominal: Bowel sounds are normal. She exhibits no distension and no mass. There is no tenderness.  Musculoskeletal: Normal range of motion. She exhibits no edema or tenderness.  Biopsy-proven melanoma mass of the medial side of the right wrist.  Lymphadenopathy:    She has no cervical adenopathy.  Neurological: She is alert and oriented to person, place, and time. She has normal reflexes.  04/24/14 MMSE 29/30. Passed clock drawing.  Skin: No rash noted. No erythema. No pallor.  SK right hip. Lipoma of left axilla medially. Firm cystic feeling  melanoma mass right wrist ulnar aspect.  Psychiatric: She has a normal mood and affect. Her behavior is normal. Judgment and thought content normal.     Labs reviewed: Appointment on 10/21/2014  Component Date Value Ref Range Status  . Glucose 10/21/2014 116* 65 - 99 mg/dL Final  . BUN 10/21/2014 15  8 - 27 mg/dL Final  . Creatinine, Ser 10/21/2014 0.62  0.57 - 1.00 mg/dL Final  . GFR calc non Af Amer 10/21/2014 92  >59 mL/min/1.73 Final  . GFR calc Af Amer 10/21/2014 106  >59 mL/min/1.73 Final  . BUN/Creatinine Ratio 10/21/2014 24  11 - 26 Final  . Sodium 10/21/2014 140  134 - 144 mmol/L Final  . Potassium 10/21/2014 4.6  3.5 - 5.2 mmol/L Final  . Chloride 10/21/2014 101  97 - 108 mmol/L Final  . CO2 10/21/2014 24  18 - 29 mmol/L Final  . Calcium 10/21/2014 9.4  8.7 - 10.3 mg/dL Final  . Total Protein 10/21/2014 6.9  6.0 - 8.5 g/dL Final  . Albumin 10/21/2014 4.1  3.6 - 4.8 g/dL Final  . Globulin, Total 10/21/2014 2.8  1.5 - 4.5 g/dL Final  . Albumin/Globulin Ratio 10/21/2014 1.5  1.1 - 2.5 Final  . Bilirubin Total 10/21/2014 0.3  0.0 - 1.2 mg/dL Final  . Alkaline Phosphatase 10/21/2014 61  39 - 117 IU/L Final  . AST 10/21/2014 17  0 - 40 IU/L Final  .  ALT 10/21/2014 20  0 - 32 IU/L Final  . Cholesterol, Total 10/21/2014 274* 100 - 199 mg/dL Final  . Triglycerides 10/21/2014 187* 0 - 149 mg/dL Final  . HDL 10/21/2014 63  >39 mg/dL Final   Comment: According to ATP-III Guidelines, HDL-C >59 mg/dL is considered a negative risk factor for CHD.   Marland Kitchen VLDL Cholesterol Cal 10/21/2014 37  5 - 40 mg/dL Final  . LDL Calculated 10/21/2014 174* 0 - 99 mg/dL Final  . Chol/HDL Ratio 10/21/2014 4.3  0.0 - 4.4 ratio units Final   Comment:                                   T. Chol/HDL Ratio                                             Men  Women                               1/2 Avg.Risk  3.4    3.3                                   Avg.Risk  5.0    4.4                                2X Avg.Risk  9.6    7.1                                3X Avg.Risk 23.4   11.0      Assessment/Plan  1. Malignant melanoma of arm, right Continues to  see oncologist about this problem  2. Hyperlipidemia Consider starting Crestor. She is reluctant due to family history of dementia.  3. Obesity Stressed dietary compliance with 1000 calorie diet. Keep 3 weeks of a food diary to bring to office.  4. Breast cancer of lower-outer quadrant of right female breast No reoccurrence  5. Type 2 diabetes mellitus without complication Dietary compliance and weight loss  6. Chronic fatigue Lose weight  7. Sciatica of right side - HYDROcodone-acetaminophen (NORCO) 10-325 MG per tablet; Take one tablet by mouth every 8 hours as needed for pain  Dispense: 90 tablet; Refill: 0  8. Endometrial polyp Proceed with surgery as recommended by GYN  9. Need for vaccination with 13-polyvalent pneumococcal conjugate vaccine - Pneumococcal conjugate vaccine 13-valent

## 2014-10-23 NOTE — Patient Instructions (Signed)
Bring food diary to next visit. Bring your insurance plan list of medications to next visit. Follow 1000 calorie diet.

## 2014-10-24 DIAGNOSIS — N840 Polyp of corpus uteri: Secondary | ICD-10-CM | POA: Insufficient documentation

## 2014-11-11 DIAGNOSIS — K219 Gastro-esophageal reflux disease without esophagitis: Secondary | ICD-10-CM | POA: Diagnosis not present

## 2014-11-11 DIAGNOSIS — E669 Obesity, unspecified: Secondary | ICD-10-CM | POA: Diagnosis not present

## 2014-11-11 DIAGNOSIS — Z8042 Family history of malignant neoplasm of prostate: Secondary | ICD-10-CM | POA: Diagnosis not present

## 2014-11-11 DIAGNOSIS — H409 Unspecified glaucoma: Secondary | ICD-10-CM | POA: Diagnosis not present

## 2014-11-11 DIAGNOSIS — N840 Polyp of corpus uteri: Secondary | ICD-10-CM | POA: Diagnosis not present

## 2014-11-11 DIAGNOSIS — Z818 Family history of other mental and behavioral disorders: Secondary | ICD-10-CM | POA: Diagnosis not present

## 2014-11-11 DIAGNOSIS — Z6838 Body mass index (BMI) 38.0-38.9, adult: Secondary | ICD-10-CM | POA: Diagnosis not present

## 2014-11-11 DIAGNOSIS — Z825 Family history of asthma and other chronic lower respiratory diseases: Secondary | ICD-10-CM | POA: Diagnosis not present

## 2014-11-11 DIAGNOSIS — Z808 Family history of malignant neoplasm of other organs or systems: Secondary | ICD-10-CM | POA: Diagnosis not present

## 2014-11-11 DIAGNOSIS — Z801 Family history of malignant neoplasm of trachea, bronchus and lung: Secondary | ICD-10-CM | POA: Diagnosis not present

## 2014-11-11 DIAGNOSIS — Z853 Personal history of malignant neoplasm of breast: Secondary | ICD-10-CM | POA: Diagnosis not present

## 2014-11-13 ENCOUNTER — Ambulatory Visit: Payer: Medicare Other | Admitting: Internal Medicine

## 2014-11-20 HISTORY — PX: POLYPECTOMY: SHX149

## 2014-11-26 ENCOUNTER — Encounter: Payer: Self-pay | Admitting: Internal Medicine

## 2014-11-26 ENCOUNTER — Ambulatory Visit (INDEPENDENT_AMBULATORY_CARE_PROVIDER_SITE_OTHER): Payer: Medicare Other | Admitting: Internal Medicine

## 2014-11-26 VITALS — BP 124/78 | HR 77 | Temp 98.8°F | Ht 64.0 in | Wt 224.0 lb

## 2014-11-26 DIAGNOSIS — E785 Hyperlipidemia, unspecified: Secondary | ICD-10-CM

## 2014-11-26 DIAGNOSIS — W57XXXA Bitten or stung by nonvenomous insect and other nonvenomous arthropods, initial encounter: Secondary | ICD-10-CM

## 2014-11-26 DIAGNOSIS — E669 Obesity, unspecified: Secondary | ICD-10-CM | POA: Diagnosis not present

## 2014-11-26 DIAGNOSIS — E119 Type 2 diabetes mellitus without complications: Secondary | ICD-10-CM

## 2014-11-26 DIAGNOSIS — C4361 Malignant melanoma of right upper limb, including shoulder: Secondary | ICD-10-CM | POA: Diagnosis not present

## 2014-11-26 DIAGNOSIS — R5382 Chronic fatigue, unspecified: Secondary | ICD-10-CM

## 2014-11-26 DIAGNOSIS — K222 Esophageal obstruction: Secondary | ICD-10-CM

## 2014-11-26 DIAGNOSIS — N840 Polyp of corpus uteri: Secondary | ICD-10-CM

## 2014-11-26 DIAGNOSIS — S30860A Insect bite (nonvenomous) of lower back and pelvis, initial encounter: Secondary | ICD-10-CM | POA: Diagnosis not present

## 2014-11-26 MED ORDER — DOXYCYCLINE HYCLATE 100 MG PO TABS: ORAL_TABLET | ORAL | Status: DC

## 2014-11-26 NOTE — Progress Notes (Signed)
Patient ID: [name], female   DOB: 02-04-1946, 69 y.o.   MRN: [id number]    Facility  [facility]    Place of Service:   OFFICE    Allergies  Allergen Reactions  . Adhesive [Tape] Dermatitis  . Betimol [Timolol Maleate]     Allergic to preservatives   . Cosopt [Dorzolamide Hcl-Timolol Mal] Other (See Comments)    Turns eye red, increases eye pressure  . Penicillins   . Tamoxifen Other (See Comments)    Vaginal bleeding  . Thimerosal     Makes pt eye red     Chief Complaint  Patient presents with  . Medical Management of Chronic Issues    3 week follow-up, labs completed 10/21/14. Due for A1c   . Insect Bite    Tick bite on upper left arm and upper back, please examine.   . Foot Problem    Soreness in left big toe     HPI:  Patient underwent endocervical curettage by Dr. Glee Arvin on 11/20/14.   Path report showed only benign cells;  there was no hyperplasia or malignancy identified.  Backache is  Better. Right sciatica is improved. Hydocodone helped.  Lethargy is better if she gets enough sleep.  Malignant melanoma is the same and painless.  Obesity: lost 9# since last visit.  Hyperlipidemia - needs follow up next visit  Type 2 diabetes mellitus without complication - Plan: Hemoglobin A1c, Comprehensive metabolic panel  Endometrial polyp; benign  Esophageal stenosis: denies food hanging or sticking  Chronic fatigue - persistent issue  Tick bite of back, initial encounter: occurred a couple of days ago.    Medications: Patient's Medications  New Prescriptions   No medications on file  Previous Medications   ALENDRONATE (FOSAMAX) 35 MG TABLET    TAKE ONE TABLET BY MOUTH EVERY 7 DAYS. TAKE WITH A FULL GLASS OF WATER ON AN EMPTY STOMACH.   B COMPLEX VITAMINS TABLET    Take 1 tablet by mouth daily.     BETAXOLOL (BETOPTIC-S) 0.5 % OPHTHALMIC SUSPENSION    Place 1 drop into the right eye 2 (two) times daily.   CALCIUM CITRATE-VITAMIN D 250-200 MG-UNIT TABS     Take 250 mg by mouth 2 (two) times daily.   CHOLECALCIFEROL (VITAMIN D3) 2000 UNITS TABS    Take by mouth daily.   CRANBERRY 1000 MG CAPS    Take 1 capsule by mouth daily.   HYDROCODONE-ACETAMINOPHEN (NORCO) 10-325 MG PER TABLET    Take one tablet by mouth every 8 hours as needed for pain   LORAZEPAM (ATIVAN) 1 MG TABLET    1/2 by mouth at night   LOTEMAX 0.5 % OPHTHALMIC SUSPENSION    Place 1 drop into the right eye 2 (two) times daily.    MUPIROCIN OINTMENT (BACTROBAN) 2 %    Place 1 application into the nose 2 (two) times daily.   OMEPRAZOLE (PRILOSEC) 20 MG CAPSULE    Take one tablet daily for reflux   PREDNISOLONE ACETATE (PRED FORTE) 1 % OPHTHALMIC SUSPENSION    Place 1 drop into the right eye every other day.    TURMERIC 500 MG CAPS    Take 500 mg by mouth. 1 by mouth daily   VALACYCLOVIR (VALTREX) 1000 MG TABLET    Take 1,000 mg by mouth daily.   Modified Medications   No medications on file  Discontinued Medications   CHOLECALCIFEROL (VITAMIN D3) 50000 UNITS CAPS    One weekly for vitamin  D supplement   LOTEPREDNOL (LOTEMAX) 0.5 % OPHTHALMIC SUSPENSION    1 drop.   PREDNISOLONE ACETATE (PRED FORTE) 1 % OPHTHALMIC SUSPENSION    1 drop.     Review of Systems  Constitutional: Positive for appetite change. Negative for fever and chills.       Decreased appetite  HENT: Positive for dental problem. Negative for congestion, ear pain and rhinorrhea.   Eyes:       History of glaucoma. History of cataracts. History of a torn retina in the right eye 03/06/2010 associated with a dense vitreous hemorrhage. She had laser therapy and cryotherapy to the right. History of herpes zoster of the right. Takes Valtrex continuously. Ptosis right eye followed shingles.  Respiratory: Negative for cough and chest tightness.   Cardiovascular: Negative for chest pain, palpitations and leg swelling.  Gastrointestinal: Negative for diarrhea.       History mild dysphagia. Food seems to hang in the chest.    Genitourinary: Negative for dysuria, urgency, frequency, hematuria, flank pain, enuresis and genital sores.       History of ovarian cysts CT scan November 2002.  Musculoskeletal: Positive for myalgias.       Chronic back pains. Pain down the right leg suggestive of sciatica. Mild arthralgias in her hips and knees. Stable balance and gait.biopsy-proven malignant melanoma of the right wrist for which she has received chemotherapy with ipilimumab. Mass has not shrunk, but is not any larger.  Skin: Negative for pallor, rash and wound.       SK right hip and other places. Cystic mass of the right wrist that has been labeled melanoma.  Neurological: Negative for headaches.  Hematological: Negative.   Psychiatric/Behavioral: Negative.  Negative for behavioral problems, confusion, sleep disturbance and agitation.    Filed Vitals:   11/26/14 1603  BP: 124/78  Pulse: 77  Temp: 98.8 F (37.1 C)  TempSrc: Oral  Height: 5\' 4"  (1.626 m)  Weight: 224 lb (101.606 kg)  SpO2: 97%   Body mass index is 38.43 kg/(m^2).  Physical Exam  Constitutional: She is oriented to person, place, and time. She appears well-developed and well-nourished. No distress.  Moderately overweight.  HENT:  Head: Normocephalic and atraumatic.  Right Ear: External ear normal.  Left Ear: External ear normal.  Nose: Nose normal.  Mouth/Throat: Oropharynx is clear and moist.  Dentures.  Eyes: Conjunctivae and EOM are normal. Pupils are equal, round, and reactive to light.  Ptosis of the right eye.  Neck: Neck supple. No JVD present. No tracheal deviation present. No thyromegaly present.  Cardiovascular: Normal rate, normal heart sounds and intact distal pulses.  Exam reveals no gallop and no friction rub.   No murmur heard. Pulmonary/Chest: Breath sounds normal. No respiratory distress. She has no wheezes. She has no rales.  Abdominal: Bowel sounds are normal. She exhibits no distension and no mass. There is no  tenderness.  Musculoskeletal: Normal range of motion. She exhibits no edema or tenderness.  Biopsy-proven melanoma mass of the medial side of the right wrist.  Lymphadenopathy:    She has no cervical adenopathy.  Neurological: She is alert and oriented to person, place, and time. She has normal reflexes.  04/24/14 MMSE 29/30. Passed clock drawing.  Skin: No rash noted. No erythema. No pallor.  SK right hip. Lipoma of left axilla medially. Firm cystic feeling melanoma mass right wrist ulnar aspect.  Psychiatric: She has a normal mood and affect. Her behavior is normal. Judgment and thought content normal.  Labs reviewed: Appointment on 10/21/2014  Component Date Value Ref Range Status  . Glucose 10/21/2014 116* 65 - 99 mg/dL Final  . BUN 10/21/2014 15  8 - 27 mg/dL Final  . Creatinine, Ser 10/21/2014 0.62  0.57 - 1.00 mg/dL Final  . GFR calc non Af Amer 10/21/2014 92  >59 mL/min/1.73 Final  . GFR calc Af Amer 10/21/2014 106  >59 mL/min/1.73 Final  . BUN/Creatinine Ratio 10/21/2014 24  11 - 26 Final  . Sodium 10/21/2014 140  134 - 144 mmol/L Final  . Potassium 10/21/2014 4.6  3.5 - 5.2 mmol/L Final  . Chloride 10/21/2014 101  97 - 108 mmol/L Final  . CO2 10/21/2014 24  18 - 29 mmol/L Final  . Calcium 10/21/2014 9.4  8.7 - 10.3 mg/dL Final  . Total Protein 10/21/2014 6.9  6.0 - 8.5 g/dL Final  . Albumin 10/21/2014 4.1  3.6 - 4.8 g/dL Final  . Globulin, Total 10/21/2014 2.8  1.5 - 4.5 g/dL Final  . Albumin/Globulin Ratio 10/21/2014 1.5  1.1 - 2.5 Final  . Bilirubin Total 10/21/2014 0.3  0.0 - 1.2 mg/dL Final  . Alkaline Phosphatase 10/21/2014 61  39 - 117 IU/L Final  . AST 10/21/2014 17  0 - 40 IU/L Final  . ALT 10/21/2014 20  0 - 32 IU/L Final  . Cholesterol, Total 10/21/2014 274* 100 - 199 mg/dL Final  . Triglycerides 10/21/2014 187* 0 - 149 mg/dL Final  . HDL 10/21/2014 63  >39 mg/dL Final   Comment: According to ATP-III Guidelines, HDL-C >59 mg/dL is considered a negative  risk factor for CHD.   Marland Kitchen VLDL Cholesterol Cal 10/21/2014 37  5 - 40 mg/dL Final  . LDL Calculated 10/21/2014 174* 0 - 99 mg/dL Final  . Chol/HDL Ratio 10/21/2014 4.3  0.0 - 4.4 ratio units Final   Comment:                                   T. Chol/HDL Ratio                                             Men  Women                               1/2 Avg.Risk  3.4    3.3                                   Avg.Risk  5.0    4.4                                2X Avg.Risk  9.6    7.1                                3X Avg.Risk 23.4   11.0      Assessment/Plan  1. Obesity Continue to work on work on weight loss  2. Hyperlipidemia - Lipid panel; Future  3. Type 2 diabetes mellitus without complication - Hemoglobin A1c; Future - Comprehensive metabolic panel; Future  4.  Malignant melanoma of arm, right unchanged  5. Endometrial polyp benign  6. Esophageal stenosis minimal symptoms  7. Chronic fatigue - Comprehensive metabolic panel; Future  8. Tick bite of back, initial encounter - doxycycline (VIBRA-TABS) 100 MG tablet; One twice daily to treat infection  Dispense: 20 tablet; Refill: 0

## 2014-12-25 LAB — HM DIABETES EYE EXAM

## 2014-12-31 DIAGNOSIS — G4700 Insomnia, unspecified: Secondary | ICD-10-CM | POA: Diagnosis not present

## 2014-12-31 DIAGNOSIS — C4361 Malignant melanoma of right upper limb, including shoulder: Secondary | ICD-10-CM | POA: Diagnosis not present

## 2014-12-31 DIAGNOSIS — R030 Elevated blood-pressure reading, without diagnosis of hypertension: Secondary | ICD-10-CM | POA: Diagnosis not present

## 2014-12-31 DIAGNOSIS — Z853 Personal history of malignant neoplasm of breast: Secondary | ICD-10-CM | POA: Diagnosis not present

## 2015-01-08 ENCOUNTER — Telehealth: Payer: Self-pay | Admitting: *Deleted

## 2015-01-08 ENCOUNTER — Other Ambulatory Visit (INDEPENDENT_AMBULATORY_CARE_PROVIDER_SITE_OTHER): Payer: Medicare Other

## 2015-01-08 ENCOUNTER — Other Ambulatory Visit: Payer: Medicare Other

## 2015-01-08 DIAGNOSIS — R109 Unspecified abdominal pain: Secondary | ICD-10-CM

## 2015-01-08 DIAGNOSIS — R350 Frequency of micturition: Secondary | ICD-10-CM | POA: Diagnosis not present

## 2015-01-08 DIAGNOSIS — IMO0001 Reserved for inherently not codable concepts without codable children: Secondary | ICD-10-CM

## 2015-01-08 LAB — POCT URINALYSIS DIPSTICK
Bilirubin, UA: NEGATIVE
Blood, UA: NEGATIVE
Glucose, UA: NEGATIVE
Ketones, UA: NEGATIVE
Nitrite, UA: NEGATIVE
Protein, UA: NEGATIVE
Spec Grav, UA: <=1.005
Urobilinogen, UA: 0.2
pH, UA: 5

## 2015-01-08 NOTE — Telephone Encounter (Signed)
Patient notified and agreed. Added patient to nurse schedule. To call Dr. Mariea Clonts after dipped if positive to get antibiotic.

## 2015-01-08 NOTE — Telephone Encounter (Signed)
If the urine dipstick we do on the spot is positive, she can have an antibiotic.

## 2015-01-08 NOTE — Telephone Encounter (Signed)
Patient called wanting to be seen on the spot. No available appointment and Dr. Eulas Post is out of office at meeting. Told patient I would take message and send to covering provider. Patient is complaining about a UTI and states she gets one frequently and an antibiotic is written for it each time. Would like an antibiotic called to pharmacy. She stated that she would come in an leave U/A specimen but she wants and antibiotic now instead of waiting for a culture to come back. Please Advise.

## 2015-01-09 DIAGNOSIS — Z9889 Other specified postprocedural states: Secondary | ICD-10-CM | POA: Diagnosis not present

## 2015-01-09 DIAGNOSIS — C4361 Malignant melanoma of right upper limb, including shoulder: Secondary | ICD-10-CM | POA: Diagnosis not present

## 2015-01-09 DIAGNOSIS — Z9841 Cataract extraction status, right eye: Secondary | ICD-10-CM | POA: Diagnosis not present

## 2015-01-09 DIAGNOSIS — H2512 Age-related nuclear cataract, left eye: Secondary | ICD-10-CM | POA: Diagnosis not present

## 2015-01-09 DIAGNOSIS — D3131 Benign neoplasm of right choroid: Secondary | ICD-10-CM | POA: Diagnosis not present

## 2015-01-09 DIAGNOSIS — H10401 Unspecified chronic conjunctivitis, right eye: Secondary | ICD-10-CM | POA: Diagnosis not present

## 2015-01-09 DIAGNOSIS — H4041X2 Glaucoma secondary to eye inflammation, right eye, moderate stage: Secondary | ICD-10-CM | POA: Diagnosis not present

## 2015-01-09 DIAGNOSIS — H10431 Chronic follicular conjunctivitis, right eye: Secondary | ICD-10-CM | POA: Diagnosis not present

## 2015-01-09 DIAGNOSIS — B0051 Herpesviral iridocyclitis: Secondary | ICD-10-CM | POA: Diagnosis not present

## 2015-01-09 DIAGNOSIS — Z961 Presence of intraocular lens: Secondary | ICD-10-CM | POA: Diagnosis not present

## 2015-01-29 ENCOUNTER — Other Ambulatory Visit: Payer: Self-pay

## 2015-01-29 DIAGNOSIS — M5431 Sciatica, right side: Secondary | ICD-10-CM

## 2015-01-29 MED ORDER — HYDROCODONE-ACETAMINOPHEN 10-325 MG PO TABS: ORAL_TABLET | ORAL | Status: DC

## 2015-02-24 DIAGNOSIS — I700 Atherosclerosis of aorta: Secondary | ICD-10-CM | POA: Diagnosis not present

## 2015-02-24 DIAGNOSIS — R911 Solitary pulmonary nodule: Secondary | ICD-10-CM | POA: Diagnosis not present

## 2015-02-24 DIAGNOSIS — C792 Secondary malignant neoplasm of skin: Secondary | ICD-10-CM | POA: Diagnosis not present

## 2015-02-24 DIAGNOSIS — Z85820 Personal history of malignant melanoma of skin: Secondary | ICD-10-CM | POA: Diagnosis not present

## 2015-02-24 DIAGNOSIS — C4361 Malignant melanoma of right upper limb, including shoulder: Secondary | ICD-10-CM | POA: Diagnosis not present

## 2015-02-24 DIAGNOSIS — K5730 Diverticulosis of large intestine without perforation or abscess without bleeding: Secondary | ICD-10-CM | POA: Diagnosis not present

## 2015-02-24 DIAGNOSIS — R0789 Other chest pain: Secondary | ICD-10-CM | POA: Diagnosis not present

## 2015-02-24 DIAGNOSIS — I2510 Atherosclerotic heart disease of native coronary artery without angina pectoris: Secondary | ICD-10-CM | POA: Diagnosis not present

## 2015-02-25 DIAGNOSIS — G4700 Insomnia, unspecified: Secondary | ICD-10-CM | POA: Diagnosis not present

## 2015-02-25 DIAGNOSIS — M7591 Shoulder lesion, unspecified, right shoulder: Secondary | ICD-10-CM | POA: Diagnosis not present

## 2015-02-25 DIAGNOSIS — Z836 Family history of other diseases of the respiratory system: Secondary | ICD-10-CM | POA: Diagnosis not present

## 2015-02-25 DIAGNOSIS — C4361 Malignant melanoma of right upper limb, including shoulder: Secondary | ICD-10-CM | POA: Diagnosis not present

## 2015-02-25 DIAGNOSIS — Z83511 Family history of glaucoma: Secondary | ICD-10-CM | POA: Diagnosis not present

## 2015-02-25 DIAGNOSIS — R030 Elevated blood-pressure reading, without diagnosis of hypertension: Secondary | ICD-10-CM | POA: Diagnosis not present

## 2015-02-25 DIAGNOSIS — Z8249 Family history of ischemic heart disease and other diseases of the circulatory system: Secondary | ICD-10-CM | POA: Diagnosis not present

## 2015-02-25 DIAGNOSIS — C799 Secondary malignant neoplasm of unspecified site: Secondary | ICD-10-CM | POA: Diagnosis not present

## 2015-02-25 DIAGNOSIS — Z91048 Other nonmedicinal substance allergy status: Secondary | ICD-10-CM | POA: Diagnosis not present

## 2015-02-25 DIAGNOSIS — Z888 Allergy status to other drugs, medicaments and biological substances status: Secondary | ICD-10-CM | POA: Diagnosis not present

## 2015-02-25 DIAGNOSIS — Z808 Family history of malignant neoplasm of other organs or systems: Secondary | ICD-10-CM | POA: Diagnosis not present

## 2015-02-25 DIAGNOSIS — Z801 Family history of malignant neoplasm of trachea, bronchus and lung: Secondary | ICD-10-CM | POA: Diagnosis not present

## 2015-02-25 DIAGNOSIS — Z880 Allergy status to penicillin: Secondary | ICD-10-CM | POA: Diagnosis not present

## 2015-02-25 DIAGNOSIS — Z806 Family history of leukemia: Secondary | ICD-10-CM | POA: Diagnosis not present

## 2015-02-25 DIAGNOSIS — M25511 Pain in right shoulder: Secondary | ICD-10-CM | POA: Diagnosis not present

## 2015-02-25 DIAGNOSIS — Z6836 Body mass index (BMI) 36.0-36.9, adult: Secondary | ICD-10-CM | POA: Diagnosis not present

## 2015-02-25 DIAGNOSIS — Z91041 Radiographic dye allergy status: Secondary | ICD-10-CM | POA: Diagnosis not present

## 2015-02-25 DIAGNOSIS — Z853 Personal history of malignant neoplasm of breast: Secondary | ICD-10-CM | POA: Diagnosis not present

## 2015-03-12 DIAGNOSIS — C4361 Malignant melanoma of right upper limb, including shoulder: Secondary | ICD-10-CM | POA: Diagnosis not present

## 2015-03-12 DIAGNOSIS — L237 Allergic contact dermatitis due to plants, except food: Secondary | ICD-10-CM | POA: Diagnosis not present

## 2015-03-12 DIAGNOSIS — L255 Unspecified contact dermatitis due to plants, except food: Secondary | ICD-10-CM | POA: Insufficient documentation

## 2015-03-27 ENCOUNTER — Other Ambulatory Visit: Payer: Medicare Other

## 2015-03-27 DIAGNOSIS — R5382 Chronic fatigue, unspecified: Secondary | ICD-10-CM

## 2015-03-27 DIAGNOSIS — E785 Hyperlipidemia, unspecified: Secondary | ICD-10-CM | POA: Diagnosis not present

## 2015-03-27 DIAGNOSIS — E119 Type 2 diabetes mellitus without complications: Secondary | ICD-10-CM

## 2015-03-28 LAB — COMPREHENSIVE METABOLIC PANEL
A/G RATIO: 1.4 (ref 1.1–2.5)
ALBUMIN: 3.9 g/dL (ref 3.6–4.8)
ALK PHOS: 57 IU/L (ref 39–117)
ALT: 18 IU/L (ref 0–32)
AST: 16 IU/L (ref 0–40)
BUN/Creatinine Ratio: 19 (ref 11–26)
BUN: 11 mg/dL (ref 8–27)
Bilirubin Total: 0.3 mg/dL (ref 0.0–1.2)
CALCIUM: 9.4 mg/dL (ref 8.7–10.3)
CO2: 25 mmol/L (ref 18–29)
CREATININE: 0.58 mg/dL (ref 0.57–1.00)
Chloride: 100 mmol/L (ref 97–108)
GFR calc Af Amer: 109 mL/min/{1.73_m2} (ref >59)
GFR calc non Af Amer: 94 mL/min/{1.73_m2} (ref >59)
GLOBULIN, TOTAL: 2.8 g/dL (ref 1.5–4.5)
Glucose: 103 mg/dL — ABNORMAL HIGH (ref 65–99)
Potassium: 4.4 mmol/L (ref 3.5–5.2)
Sodium: 141 mmol/L (ref 134–144)
Total Protein: 6.7 g/dL (ref 6.0–8.5)

## 2015-03-28 LAB — LIPID PANEL
CHOL/HDL RATIO: 3.6 ratio (ref 0.0–4.4)
Cholesterol, Total: 229 mg/dL — ABNORMAL HIGH (ref 100–199)
HDL: 64 mg/dL (ref >39)
LDL CALC: 133 mg/dL — AB (ref 0–99)
Triglycerides: 159 mg/dL — ABNORMAL HIGH (ref 0–149)
VLDL Cholesterol Cal: 32 mg/dL (ref 5–40)

## 2015-03-28 LAB — HEMOGLOBIN A1C
Est. average glucose Bld gHb Est-mCnc: 120 mg/dL
Hgb A1c MFr Bld: 5.8 % — ABNORMAL HIGH (ref 4.8–5.6)

## 2015-04-01 ENCOUNTER — Ambulatory Visit (INDEPENDENT_AMBULATORY_CARE_PROVIDER_SITE_OTHER): Payer: Medicare Other | Admitting: Internal Medicine

## 2015-04-01 ENCOUNTER — Encounter: Payer: Self-pay | Admitting: Internal Medicine

## 2015-04-01 VITALS — BP 132/80 | HR 72 | Temp 98.3°F | Resp 16 | Ht 64.0 in | Wt 215.0 lb

## 2015-04-01 DIAGNOSIS — E119 Type 2 diabetes mellitus without complications: Secondary | ICD-10-CM | POA: Diagnosis not present

## 2015-04-01 DIAGNOSIS — C4361 Malignant melanoma of right upper limb, including shoulder: Secondary | ICD-10-CM | POA: Diagnosis not present

## 2015-04-01 DIAGNOSIS — M5431 Sciatica, right side: Secondary | ICD-10-CM | POA: Diagnosis not present

## 2015-04-01 DIAGNOSIS — R1314 Dysphagia, pharyngoesophageal phase: Secondary | ICD-10-CM | POA: Insufficient documentation

## 2015-04-01 DIAGNOSIS — E669 Obesity, unspecified: Secondary | ICD-10-CM

## 2015-04-01 DIAGNOSIS — K219 Gastro-esophageal reflux disease without esophagitis: Secondary | ICD-10-CM | POA: Diagnosis not present

## 2015-04-01 DIAGNOSIS — Z23 Encounter for immunization: Secondary | ICD-10-CM | POA: Diagnosis not present

## 2015-04-01 MED ORDER — HYDROCODONE-ACETAMINOPHEN 10-325 MG PO TABS: ORAL_TABLET | ORAL | Status: DC

## 2015-04-01 NOTE — Progress Notes (Signed)
Patient ID: [name], female   DOB: 1946-03-13, 69 y.o.   MRN: [id number]    Facility  [facility]    Place of Service:   OFFICE    Allergies  Allergen Reactions  . Adhesive [Tape] Dermatitis  . Betimol [Timolol Maleate]     Allergic to preservatives   . Cosopt [Dorzolamide Hcl-Timolol Mal] Other (See Comments)    Turns eye red, increases eye pressure  . Penicillins   . Tamoxifen Other (See Comments)    Vaginal bleeding  . Thimerosal     Makes pt eye red     Chief Complaint  Patient presents with  . Medical Management of Chronic Issues    4 month follow-up, discuss labs (copy printed)   . Immunizations    Discuss flu vaccine    HPI:  Sciatica of right side - unchanged  Type 2 diabetes mellitus without complication: controlled  Malignant melanoma of arm, right - she is going to start taking ESSIAC tincture or several herbs. Continues to see Drs. at [facility]. Currently measures about 4 cm x 3 cm.  Obesity - 9 pounds of intentional weight loss since 11/26/2014  Gastroesophageal reflux disease without esophagitis - improved  Dysphagia, pharyngoesophageal phase - food occasionally sticks in esophagus.    Medications: Patient's Medications  New Prescriptions   No medications on file  Previous Medications   ALENDRONATE (FOSAMAX) 35 MG TABLET    TAKE ONE TABLET BY MOUTH EVERY 7 DAYS. TAKE WITH A FULL GLASS OF WATER ON AN EMPTY STOMACH.   BETAXOLOL (BETOPTIC-S) 0.5 % OPHTHALMIC SUSPENSION    Place 1 drop into the right eye 2 (two) times daily.   CALCIUM CITRATE-VITAMIN D 250-200 MG-UNIT TABS    Take 250 mg by mouth 2 (two) times daily.   CHOLECALCIFEROL (VITAMIN D3) 2000 UNITS TABS    Take by mouth daily.   CRANBERRY 1000 MG CAPS    Take 1 capsule by mouth daily.   CYANOCOBALAMIN (B-12) 1000 MCG CAPS    Take by mouth daily.   HYDROCODONE-ACETAMINOPHEN (NORCO) 10-325 MG PER TABLET    Take one tablet by mouth every 8 hours as needed for pain   LORAZEPAM (ATIVAN) 1 MG  TABLET    1/2 by mouth at night   LOTEMAX 0.5 % OPHTHALMIC SUSPENSION    Place 1 drop into the right eye 2 (two) times daily.    MUPIROCIN OINTMENT (BACTROBAN) 2 %    Place 1 application into the nose 2 (two) times daily.   OMEPRAZOLE (PRILOSEC) 20 MG CAPSULE    Take one tablet daily for reflux   PREDNISOLONE ACETATE (PRED FORTE) 1 % OPHTHALMIC SUSPENSION    Place 1 drop into the right eye every other day.    PYRIDOXINE HCL (B-6 PO)    Take 1,000 mg by mouth daily.   TURMERIC 500 MG CAPS    Take 500 mg by mouth. 1 by mouth daily   VALACYCLOVIR (VALTREX) 1000 MG TABLET    Take 1,000 mg by mouth daily.   Modified Medications   No medications on file  Discontinued Medications   B COMPLEX VITAMINS TABLET    Take 1 tablet by mouth daily.     DOXYCYCLINE (VIBRA-TABS) 100 MG TABLET    One twice daily to treat infection     Review of Systems  Constitutional: Positive for appetite change. Negative for fever and chills.       Decreased appetite  HENT: Positive for dental problem. Negative  for congestion, ear pain and rhinorrhea.   Eyes:       History of glaucoma. History of cataracts. History of a torn retina in the right eye 03/06/2010 associated with a dense vitreous hemorrhage. She had laser therapy and cryotherapy to the right. History of herpes zoster of the right. Takes Valtrex continuously. Ptosis right eye followed shingles.  Respiratory: Negative for cough and chest tightness.   Cardiovascular: Negative for chest pain, palpitations and leg swelling.  Gastrointestinal: Negative for diarrhea.       History mild dysphagia. Food seems to hang in the chest.  Genitourinary: Negative for dysuria, urgency, frequency, hematuria, flank pain, enuresis and genital sores.       History of ovarian cysts CT scan November 2002.  Musculoskeletal: Positive for myalgias.       Chronic back pains. Pain down the right leg suggestive of sciatica. Mild arthralgias in her hips and knees. Stable balance and  gait.biopsy-proven malignant melanoma of the right wrist for which she has received chemotherapy with ipilimumab. Mass has not shrunk, but is not any larger.  Skin: Negative for pallor, rash and wound.       SK right hip and other places. Cystic mass of the right wrist that has been labeled melanoma.  Neurological: Negative for headaches.  Hematological: Negative.   Psychiatric/Behavioral: Negative.  Negative for behavioral problems, confusion, sleep disturbance and agitation.    Filed Vitals:   04/01/15 1446  BP: 132/80  Pulse: 72  Temp: 98.3 F (36.8 C)  TempSrc: Oral  Resp: 16  Height: 5' 4" (1.626 m)  Weight: 215 lb (97.523 kg)  SpO2: 95%   Body mass index is 36.89 kg/(m^2).  Physical Exam  Constitutional: She is oriented to person, place, and time. She appears well-developed and well-nourished. No distress.  Moderately overweight.  HENT:  Head: Normocephalic and atraumatic.  Right Ear: External ear normal.  Left Ear: External ear normal.  Nose: Nose normal.  Mouth/Throat: Oropharynx is clear and moist.  Dentures.  Eyes: Conjunctivae and EOM are normal. Pupils are equal, round, and reactive to light.  Ptosis of the right eye.  Neck: Neck supple. No JVD present. No tracheal deviation present. No thyromegaly present.  Cardiovascular: Normal rate, normal heart sounds and intact distal pulses.  Exam reveals no gallop and no friction rub.   No murmur heard. Pulmonary/Chest: Breath sounds normal. No respiratory distress. She has no wheezes. She has no rales.  Abdominal: Bowel sounds are normal. She exhibits no distension and no mass. There is no tenderness.  Musculoskeletal: Normal range of motion. She exhibits no edema or tenderness.  Biopsy-proven melanoma mass of the medial side of the right wrist.  Lymphadenopathy:    She has no cervical adenopathy.  Neurological: She is alert and oriented to person, place, and time. She has normal reflexes.  04/24/14 MMSE 29/30. Passed  clock drawing.  Skin: No rash noted. No erythema. No pallor.  SK right hip. Lipoma of left axilla medially. Firm cystic feeling melanoma mass right wrist ulnar aspect. Firm nodule at the left wrist radially.  Psychiatric: She has a normal mood and affect. Her behavior is normal. Judgment and thought content normal.     Labs reviewed: Lab Summary Latest Ref Rng 03/27/2015 10/21/2014 04/22/2014  Hemoglobin 13.0-17.0 g/dL (None) (None) (None)  Hematocrit 39.0-52.0 % (None) (None) (None)  White count - (None) (None) (None)  Platelet count - (None) (None) (None)  Sodium 134 - 144 mmol/L 141 140 138  Potassium 3.5 -  5.2 mmol/L 4.4 4.6 4.5  Calcium 8.7 - 10.3 mg/dL 9.4 9.4 9.6  Phosphorus - (None) (None) (None)  Creatinine 0.57 - 1.00 mg/dL 0.58 0.62 0.68  AST 0 - 40 IU/L _0 Alk Phos 39 - 117 IU/L 57 61 73  Bilirubin 0.0 - 1.2 mg/dL 0.3 0.3 0.3  Glucose 65 - 99 mg/dL 103(H) 116(H) 109(H)  Cholesterol - (None) (None) (None)  HDL cholesterol >39 mg/dL 64 63 58  Triglycerides 0 - 149 mg/dL 159(H) 187(H) 182(H)  LDL Direct - (None) (None) (None)  LDL Calc 0 - 99 mg/dL 133(H) 174(H) 146(H)  Total protein - (None) (None) (None)  Albumin 3.6 - 4.8 g/dL 3.9 4.1 4.1   No results found for: TSH, T3TOTAL, T4TOTAL, THYROIDAB Lab Results  Component Value Date   BUN 11 03/27/2015   Lab Results  Component Value Date   HGBA1C 5.8* 03/27/2015    Assessment/Plan  1. Type 2 diabetes mellitus without complication Controlled - Hemoglobin A1c; Future - Comprehensive metabolic panel; Future  2. Malignant melanoma of arm, right Continue appointments at [facility]. She has a CT scan scheduled soon.  3. Obesity Continue weight loss  4. Gastroesophageal reflux disease without esophagitis Likely cause of her dysphagia. She is already on omeprazole. Consider GI referral if symptoms worsen  5. Dysphagia, pharyngoesophageal phase Consider GI referral if symptoms worsen  6. Sciatica of  right side Intermittent discomfort in the right side originating in the back. Adequate relief is obtained from her prescription: - HYDROcodone-acetaminophen (NORCO) 10-325 MG per tablet; Take one tablet by mouth every 8 hours as needed for pain  Dispense: 90 tablet; Refill: 0  7. Encounter for immunization Flu vaccine

## 2015-04-02 DIAGNOSIS — C7641 Malignant neoplasm of right upper limb: Secondary | ICD-10-CM | POA: Diagnosis not present

## 2015-04-02 DIAGNOSIS — B0051 Herpesviral iridocyclitis: Secondary | ICD-10-CM | POA: Diagnosis not present

## 2015-04-02 DIAGNOSIS — R5383 Other fatigue: Secondary | ICD-10-CM | POA: Diagnosis not present

## 2015-04-02 DIAGNOSIS — C4361 Malignant melanoma of right upper limb, including shoulder: Secondary | ICD-10-CM | POA: Diagnosis not present

## 2015-04-08 DIAGNOSIS — C4361 Malignant melanoma of right upper limb, including shoulder: Secondary | ICD-10-CM | POA: Diagnosis not present

## 2015-04-08 DIAGNOSIS — R030 Elevated blood-pressure reading, without diagnosis of hypertension: Secondary | ICD-10-CM | POA: Diagnosis not present

## 2015-04-08 DIAGNOSIS — C799 Secondary malignant neoplasm of unspecified site: Secondary | ICD-10-CM | POA: Diagnosis not present

## 2015-04-08 DIAGNOSIS — D492 Neoplasm of unspecified behavior of bone, soft tissue, and skin: Secondary | ICD-10-CM | POA: Diagnosis not present

## 2015-04-08 DIAGNOSIS — Z888 Allergy status to other drugs, medicaments and biological substances status: Secondary | ICD-10-CM | POA: Diagnosis not present

## 2015-04-08 DIAGNOSIS — G4700 Insomnia, unspecified: Secondary | ICD-10-CM | POA: Diagnosis not present

## 2015-04-08 DIAGNOSIS — Z880 Allergy status to penicillin: Secondary | ICD-10-CM | POA: Diagnosis not present

## 2015-04-08 DIAGNOSIS — R7309 Other abnormal glucose: Secondary | ICD-10-CM | POA: Diagnosis not present

## 2015-04-17 DIAGNOSIS — Z961 Presence of intraocular lens: Secondary | ICD-10-CM | POA: Diagnosis not present

## 2015-04-17 DIAGNOSIS — H40041 Steroid responder, right eye: Secondary | ICD-10-CM | POA: Diagnosis not present

## 2015-04-17 DIAGNOSIS — H10431 Chronic follicular conjunctivitis, right eye: Secondary | ICD-10-CM | POA: Diagnosis not present

## 2015-04-17 DIAGNOSIS — Z9841 Cataract extraction status, right eye: Secondary | ICD-10-CM | POA: Diagnosis not present

## 2015-04-17 DIAGNOSIS — B0051 Herpesviral iridocyclitis: Secondary | ICD-10-CM | POA: Diagnosis not present

## 2015-05-27 DIAGNOSIS — Z853 Personal history of malignant neoplasm of breast: Secondary | ICD-10-CM | POA: Diagnosis not present

## 2015-05-27 DIAGNOSIS — C4360 Malignant melanoma of unspecified upper limb, including shoulder: Secondary | ICD-10-CM | POA: Diagnosis not present

## 2015-06-26 ENCOUNTER — Encounter: Payer: Self-pay | Admitting: Internal Medicine

## 2015-06-30 DIAGNOSIS — D492 Neoplasm of unspecified behavior of bone, soft tissue, and skin: Secondary | ICD-10-CM | POA: Diagnosis not present

## 2015-06-30 DIAGNOSIS — M67431 Ganglion, right wrist: Secondary | ICD-10-CM | POA: Diagnosis not present

## 2015-06-30 DIAGNOSIS — C4361 Malignant melanoma of right upper limb, including shoulder: Secondary | ICD-10-CM | POA: Diagnosis not present

## 2015-07-08 DIAGNOSIS — G4700 Insomnia, unspecified: Secondary | ICD-10-CM | POA: Diagnosis not present

## 2015-07-08 DIAGNOSIS — Z853 Personal history of malignant neoplasm of breast: Secondary | ICD-10-CM | POA: Diagnosis not present

## 2015-07-08 DIAGNOSIS — Z85831 Personal history of malignant neoplasm of soft tissue: Secondary | ICD-10-CM | POA: Diagnosis not present

## 2015-07-08 DIAGNOSIS — M19071 Primary osteoarthritis, right ankle and foot: Secondary | ICD-10-CM | POA: Diagnosis not present

## 2015-07-08 DIAGNOSIS — Z880 Allergy status to penicillin: Secondary | ICD-10-CM | POA: Diagnosis not present

## 2015-07-08 DIAGNOSIS — Z888 Allergy status to other drugs, medicaments and biological substances status: Secondary | ICD-10-CM | POA: Diagnosis not present

## 2015-07-08 DIAGNOSIS — C799 Secondary malignant neoplasm of unspecified site: Secondary | ICD-10-CM | POA: Diagnosis not present

## 2015-07-08 DIAGNOSIS — Z08 Encounter for follow-up examination after completed treatment for malignant neoplasm: Secondary | ICD-10-CM | POA: Diagnosis not present

## 2015-07-08 DIAGNOSIS — Z85820 Personal history of malignant melanoma of skin: Secondary | ICD-10-CM | POA: Diagnosis not present

## 2015-07-08 DIAGNOSIS — M7731 Calcaneal spur, right foot: Secondary | ICD-10-CM | POA: Diagnosis not present

## 2015-07-08 DIAGNOSIS — R030 Elevated blood-pressure reading, without diagnosis of hypertension: Secondary | ICD-10-CM | POA: Diagnosis not present

## 2015-07-08 DIAGNOSIS — R7303 Prediabetes: Secondary | ICD-10-CM | POA: Diagnosis not present

## 2015-07-08 DIAGNOSIS — C4361 Malignant melanoma of right upper limb, including shoulder: Secondary | ICD-10-CM | POA: Diagnosis not present

## 2015-07-08 DIAGNOSIS — D480 Neoplasm of uncertain behavior of bone and articular cartilage: Secondary | ICD-10-CM | POA: Diagnosis not present

## 2015-07-08 DIAGNOSIS — Z0389 Encounter for observation for other suspected diseases and conditions ruled out: Secondary | ICD-10-CM | POA: Diagnosis not present

## 2015-07-08 DIAGNOSIS — M79671 Pain in right foot: Secondary | ICD-10-CM | POA: Diagnosis not present

## 2015-07-11 ENCOUNTER — Other Ambulatory Visit: Payer: Self-pay | Admitting: *Deleted

## 2015-07-11 DIAGNOSIS — M5431 Sciatica, right side: Secondary | ICD-10-CM

## 2015-07-11 MED ORDER — HYDROCODONE-ACETAMINOPHEN 10-325 MG PO TABS: ORAL_TABLET | ORAL | Status: DC

## 2015-07-11 NOTE — Telephone Encounter (Signed)
Patient requested and will pick up. Narcotic contract printed. 

## 2015-07-28 LAB — HM MAMMOGRAPHY: HM MAMMO: NORMAL (ref 0–4)

## 2015-08-04 ENCOUNTER — Other Ambulatory Visit: Payer: Medicare Other

## 2015-08-04 DIAGNOSIS — R928 Other abnormal and inconclusive findings on diagnostic imaging of breast: Secondary | ICD-10-CM | POA: Diagnosis not present

## 2015-08-04 DIAGNOSIS — C50911 Malignant neoplasm of unspecified site of right female breast: Secondary | ICD-10-CM | POA: Diagnosis not present

## 2015-08-04 DIAGNOSIS — Z08 Encounter for follow-up examination after completed treatment for malignant neoplasm: Secondary | ICD-10-CM | POA: Diagnosis not present

## 2015-08-06 ENCOUNTER — Ambulatory Visit: Payer: Medicare Other | Admitting: Internal Medicine

## 2015-08-13 ENCOUNTER — Ambulatory Visit: Payer: Medicare Other | Admitting: Internal Medicine

## 2015-08-15 DIAGNOSIS — C50911 Malignant neoplasm of unspecified site of right female breast: Secondary | ICD-10-CM | POA: Diagnosis not present

## 2015-08-15 DIAGNOSIS — M810 Age-related osteoporosis without current pathological fracture: Secondary | ICD-10-CM | POA: Diagnosis not present

## 2015-08-15 DIAGNOSIS — Z08 Encounter for follow-up examination after completed treatment for malignant neoplasm: Secondary | ICD-10-CM | POA: Diagnosis not present

## 2015-08-15 DIAGNOSIS — Z79899 Other long term (current) drug therapy: Secondary | ICD-10-CM | POA: Diagnosis not present

## 2015-08-15 DIAGNOSIS — Z85820 Personal history of malignant melanoma of skin: Secondary | ICD-10-CM | POA: Diagnosis not present

## 2015-08-15 DIAGNOSIS — Z853 Personal history of malignant neoplasm of breast: Secondary | ICD-10-CM | POA: Diagnosis not present

## 2015-08-15 DIAGNOSIS — Z9889 Other specified postprocedural states: Secondary | ICD-10-CM | POA: Diagnosis not present

## 2015-08-18 DIAGNOSIS — K219 Gastro-esophageal reflux disease without esophagitis: Secondary | ICD-10-CM | POA: Diagnosis not present

## 2015-08-21 ENCOUNTER — Other Ambulatory Visit: Payer: Self-pay

## 2015-08-21 DIAGNOSIS — J340 Abscess, furuncle and carbuncle of nose: Secondary | ICD-10-CM

## 2015-08-21 MED ORDER — MUPIROCIN 2 % EX OINT
1.0000 | TOPICAL_OINTMENT | Freq: Two times a day (BID) | CUTANEOUS | Status: DC
Start: 2015-08-21 — End: 2016-05-05

## 2015-08-21 NOTE — Telephone Encounter (Signed)
Patient called and requested a refill for mupirocin cream and that the pharmacy should be changed in her file.   New pharmacy: [facility] on [address], [place].  Patient notified that prescription was sent to new pharmacy.

## 2015-08-26 DIAGNOSIS — D3131 Benign neoplasm of right choroid: Secondary | ICD-10-CM | POA: Diagnosis not present

## 2015-08-26 DIAGNOSIS — Z9889 Other specified postprocedural states: Secondary | ICD-10-CM | POA: Diagnosis not present

## 2015-08-26 DIAGNOSIS — Z9841 Cataract extraction status, right eye: Secondary | ICD-10-CM | POA: Diagnosis not present

## 2015-08-26 DIAGNOSIS — Z961 Presence of intraocular lens: Secondary | ICD-10-CM | POA: Diagnosis not present

## 2015-08-26 DIAGNOSIS — B0051 Herpesviral iridocyclitis: Secondary | ICD-10-CM | POA: Diagnosis not present

## 2015-08-26 DIAGNOSIS — H4041X2 Glaucoma secondary to eye inflammation, right eye, moderate stage: Secondary | ICD-10-CM | POA: Diagnosis not present

## 2015-08-26 DIAGNOSIS — H2512 Age-related nuclear cataract, left eye: Secondary | ICD-10-CM | POA: Diagnosis not present

## 2015-08-26 DIAGNOSIS — H10431 Chronic follicular conjunctivitis, right eye: Secondary | ICD-10-CM | POA: Diagnosis not present

## 2015-09-01 ENCOUNTER — Other Ambulatory Visit: Payer: Medicare Other

## 2015-09-03 ENCOUNTER — Ambulatory Visit: Payer: Medicare Other | Admitting: Internal Medicine

## 2015-10-03 DIAGNOSIS — C4361 Malignant melanoma of right upper limb, including shoulder: Secondary | ICD-10-CM | POA: Diagnosis not present

## 2015-10-07 DIAGNOSIS — Z880 Allergy status to penicillin: Secondary | ICD-10-CM | POA: Diagnosis not present

## 2015-10-07 DIAGNOSIS — C4361 Malignant melanoma of right upper limb, including shoulder: Secondary | ICD-10-CM | POA: Diagnosis not present

## 2015-10-07 DIAGNOSIS — C799 Secondary malignant neoplasm of unspecified site: Secondary | ICD-10-CM | POA: Diagnosis not present

## 2015-10-07 DIAGNOSIS — Z853 Personal history of malignant neoplasm of breast: Secondary | ICD-10-CM | POA: Diagnosis not present

## 2015-10-07 DIAGNOSIS — Z08 Encounter for follow-up examination after completed treatment for malignant neoplasm: Secondary | ICD-10-CM | POA: Diagnosis not present

## 2015-10-07 DIAGNOSIS — R490 Dysphonia: Secondary | ICD-10-CM | POA: Diagnosis not present

## 2015-10-07 DIAGNOSIS — G4700 Insomnia, unspecified: Secondary | ICD-10-CM | POA: Diagnosis not present

## 2015-10-07 DIAGNOSIS — Z923 Personal history of irradiation: Secondary | ICD-10-CM | POA: Diagnosis not present

## 2015-10-07 DIAGNOSIS — Z85820 Personal history of malignant melanoma of skin: Secondary | ICD-10-CM | POA: Diagnosis not present

## 2015-10-07 DIAGNOSIS — C7641 Malignant neoplasm of right upper limb: Secondary | ICD-10-CM | POA: Diagnosis not present

## 2015-10-07 DIAGNOSIS — Z0389 Encounter for observation for other suspected diseases and conditions ruled out: Secondary | ICD-10-CM | POA: Diagnosis not present

## 2015-10-07 DIAGNOSIS — J339 Nasal polyp, unspecified: Secondary | ICD-10-CM | POA: Diagnosis not present

## 2015-10-07 DIAGNOSIS — Z9889 Other specified postprocedural states: Secondary | ICD-10-CM | POA: Diagnosis not present

## 2015-10-16 DIAGNOSIS — C799 Secondary malignant neoplasm of unspecified site: Secondary | ICD-10-CM | POA: Diagnosis not present

## 2015-10-16 DIAGNOSIS — J339 Nasal polyp, unspecified: Secondary | ICD-10-CM | POA: Insufficient documentation

## 2015-10-16 DIAGNOSIS — J342 Deviated nasal septum: Secondary | ICD-10-CM | POA: Insufficient documentation

## 2015-10-16 DIAGNOSIS — Z880 Allergy status to penicillin: Secondary | ICD-10-CM | POA: Diagnosis not present

## 2015-10-16 DIAGNOSIS — J329 Chronic sinusitis, unspecified: Secondary | ICD-10-CM | POA: Diagnosis not present

## 2015-10-16 DIAGNOSIS — Z888 Allergy status to other drugs, medicaments and biological substances status: Secondary | ICD-10-CM | POA: Diagnosis not present

## 2015-10-21 ENCOUNTER — Other Ambulatory Visit: Payer: Self-pay | Admitting: *Deleted

## 2015-10-21 DIAGNOSIS — M5431 Sciatica, right side: Secondary | ICD-10-CM

## 2015-10-21 MED ORDER — HYDROCODONE-ACETAMINOPHEN 10-325 MG PO TABS: ORAL_TABLET | ORAL | Status: DC

## 2015-10-21 NOTE — Telephone Encounter (Signed)
Patient requested and will pick up 

## 2015-10-23 DIAGNOSIS — R490 Dysphonia: Secondary | ICD-10-CM | POA: Diagnosis not present

## 2015-10-23 DIAGNOSIS — J387 Other diseases of larynx: Secondary | ICD-10-CM | POA: Diagnosis not present

## 2015-10-23 DIAGNOSIS — Z888 Allergy status to other drugs, medicaments and biological substances status: Secondary | ICD-10-CM | POA: Diagnosis not present

## 2015-10-23 DIAGNOSIS — R6889 Other general symptoms and signs: Secondary | ICD-10-CM | POA: Diagnosis not present

## 2015-10-23 DIAGNOSIS — Z880 Allergy status to penicillin: Secondary | ICD-10-CM | POA: Diagnosis not present

## 2015-10-23 DIAGNOSIS — K219 Gastro-esophageal reflux disease without esophagitis: Secondary | ICD-10-CM | POA: Diagnosis not present

## 2015-10-25 HISTORY — PX: NASAL POLYP EXCISION: SHX2068

## 2015-11-27 DIAGNOSIS — N950 Postmenopausal bleeding: Secondary | ICD-10-CM | POA: Diagnosis not present

## 2015-11-27 DIAGNOSIS — Z853 Personal history of malignant neoplasm of breast: Secondary | ICD-10-CM | POA: Diagnosis not present

## 2015-11-27 DIAGNOSIS — Z9889 Other specified postprocedural states: Secondary | ICD-10-CM | POA: Diagnosis not present

## 2015-11-27 DIAGNOSIS — Z923 Personal history of irradiation: Secondary | ICD-10-CM | POA: Diagnosis not present

## 2015-11-27 DIAGNOSIS — Z79811 Long term (current) use of aromatase inhibitors: Secondary | ICD-10-CM | POA: Diagnosis not present

## 2015-11-27 DIAGNOSIS — Z79899 Other long term (current) drug therapy: Secondary | ICD-10-CM | POA: Diagnosis not present

## 2015-11-27 DIAGNOSIS — K222 Esophageal obstruction: Secondary | ICD-10-CM | POA: Insufficient documentation

## 2015-11-27 DIAGNOSIS — N840 Polyp of corpus uteri: Secondary | ICD-10-CM | POA: Diagnosis not present

## 2015-11-27 DIAGNOSIS — K2210 Ulcer of esophagus without bleeding: Secondary | ICD-10-CM | POA: Insufficient documentation

## 2015-11-27 DIAGNOSIS — Z85820 Personal history of malignant melanoma of skin: Secondary | ICD-10-CM | POA: Diagnosis not present

## 2015-12-04 DIAGNOSIS — J339 Nasal polyp, unspecified: Secondary | ICD-10-CM | POA: Diagnosis not present

## 2015-12-04 DIAGNOSIS — J328 Other chronic sinusitis: Secondary | ICD-10-CM | POA: Diagnosis not present

## 2015-12-04 DIAGNOSIS — Z880 Allergy status to penicillin: Secondary | ICD-10-CM | POA: Diagnosis not present

## 2015-12-04 DIAGNOSIS — Z888 Allergy status to other drugs, medicaments and biological substances status: Secondary | ICD-10-CM | POA: Diagnosis not present

## 2015-12-04 DIAGNOSIS — J342 Deviated nasal septum: Secondary | ICD-10-CM | POA: Diagnosis not present

## 2015-12-04 DIAGNOSIS — J330 Polyp of nasal cavity: Secondary | ICD-10-CM | POA: Diagnosis not present

## 2015-12-04 DIAGNOSIS — Z85820 Personal history of malignant melanoma of skin: Secondary | ICD-10-CM | POA: Diagnosis not present

## 2015-12-04 DIAGNOSIS — J329 Chronic sinusitis, unspecified: Secondary | ICD-10-CM | POA: Diagnosis not present

## 2015-12-12 ENCOUNTER — Other Ambulatory Visit: Payer: Medicare Other

## 2015-12-16 ENCOUNTER — Ambulatory Visit: Payer: Medicare Other | Admitting: Internal Medicine

## 2015-12-24 DIAGNOSIS — R490 Dysphonia: Secondary | ICD-10-CM | POA: Diagnosis not present

## 2015-12-24 DIAGNOSIS — R6889 Other general symptoms and signs: Secondary | ICD-10-CM | POA: Diagnosis not present

## 2015-12-24 DIAGNOSIS — K219 Gastro-esophageal reflux disease without esophagitis: Secondary | ICD-10-CM | POA: Diagnosis not present

## 2015-12-25 DIAGNOSIS — Z888 Allergy status to other drugs, medicaments and biological substances status: Secondary | ICD-10-CM | POA: Diagnosis not present

## 2015-12-25 DIAGNOSIS — H10431 Chronic follicular conjunctivitis, right eye: Secondary | ICD-10-CM | POA: Diagnosis not present

## 2015-12-25 DIAGNOSIS — Z961 Presence of intraocular lens: Secondary | ICD-10-CM | POA: Diagnosis not present

## 2015-12-25 DIAGNOSIS — H4089 Other specified glaucoma: Secondary | ICD-10-CM | POA: Diagnosis not present

## 2015-12-25 DIAGNOSIS — E1136 Type 2 diabetes mellitus with diabetic cataract: Secondary | ICD-10-CM | POA: Diagnosis not present

## 2015-12-25 DIAGNOSIS — Z9849 Cataract extraction status, unspecified eye: Secondary | ICD-10-CM | POA: Diagnosis not present

## 2015-12-25 DIAGNOSIS — D3131 Benign neoplasm of right choroid: Secondary | ICD-10-CM | POA: Diagnosis not present

## 2015-12-25 DIAGNOSIS — Z880 Allergy status to penicillin: Secondary | ICD-10-CM | POA: Diagnosis not present

## 2015-12-25 LAB — HM DIABETES EYE EXAM

## 2016-01-02 DIAGNOSIS — C4361 Malignant melanoma of right upper limb, including shoulder: Secondary | ICD-10-CM | POA: Diagnosis not present

## 2016-01-02 DIAGNOSIS — R2231 Localized swelling, mass and lump, right upper limb: Secondary | ICD-10-CM | POA: Diagnosis not present

## 2016-01-02 DIAGNOSIS — C7989 Secondary malignant neoplasm of other specified sites: Secondary | ICD-10-CM | POA: Diagnosis not present

## 2016-01-02 DIAGNOSIS — Z85820 Personal history of malignant melanoma of skin: Secondary | ICD-10-CM | POA: Diagnosis not present

## 2016-01-06 DIAGNOSIS — C799 Secondary malignant neoplasm of unspecified site: Secondary | ICD-10-CM | POA: Diagnosis not present

## 2016-01-06 DIAGNOSIS — C7641 Malignant neoplasm of right upper limb: Secondary | ICD-10-CM | POA: Diagnosis not present

## 2016-01-06 DIAGNOSIS — Z0389 Encounter for observation for other suspected diseases and conditions ruled out: Secondary | ICD-10-CM | POA: Diagnosis not present

## 2016-01-06 DIAGNOSIS — C4361 Malignant melanoma of right upper limb, including shoulder: Secondary | ICD-10-CM | POA: Diagnosis not present

## 2016-01-06 DIAGNOSIS — Z08 Encounter for follow-up examination after completed treatment for malignant neoplasm: Secondary | ICD-10-CM | POA: Diagnosis not present

## 2016-01-07 ENCOUNTER — Other Ambulatory Visit: Payer: Self-pay | Admitting: *Deleted

## 2016-01-07 DIAGNOSIS — M5431 Sciatica, right side: Secondary | ICD-10-CM

## 2016-01-07 MED ORDER — HYDROCODONE-ACETAMINOPHEN 10-325 MG PO TABS: ORAL_TABLET | ORAL | Status: DC

## 2016-01-07 NOTE — Telephone Encounter (Signed)
Patient requested and will pick up 

## 2016-01-22 DIAGNOSIS — R6889 Other general symptoms and signs: Secondary | ICD-10-CM | POA: Diagnosis not present

## 2016-01-22 DIAGNOSIS — J387 Other diseases of larynx: Secondary | ICD-10-CM | POA: Diagnosis not present

## 2016-01-22 DIAGNOSIS — R490 Dysphonia: Secondary | ICD-10-CM | POA: Diagnosis not present

## 2016-01-22 DIAGNOSIS — K219 Gastro-esophageal reflux disease without esophagitis: Secondary | ICD-10-CM | POA: Diagnosis not present

## 2016-01-26 DIAGNOSIS — K219 Gastro-esophageal reflux disease without esophagitis: Secondary | ICD-10-CM | POA: Insufficient documentation

## 2016-01-28 DIAGNOSIS — I781 Nevus, non-neoplastic: Secondary | ICD-10-CM | POA: Diagnosis not present

## 2016-01-28 DIAGNOSIS — C4361 Malignant melanoma of right upper limb, including shoulder: Secondary | ICD-10-CM | POA: Diagnosis not present

## 2016-01-28 DIAGNOSIS — D229 Melanocytic nevi, unspecified: Secondary | ICD-10-CM | POA: Diagnosis not present

## 2016-01-28 DIAGNOSIS — L821 Other seborrheic keratosis: Secondary | ICD-10-CM | POA: Diagnosis not present

## 2016-02-17 NOTE — Addendum Note (Signed)
Addended by: Moshe Cipro MESHELL A on: 02/17/2016 04:33 PM   Modules accepted: Orders

## 2016-02-27 ENCOUNTER — Other Ambulatory Visit: Payer: Medicare Other

## 2016-03-03 ENCOUNTER — Ambulatory Visit: Payer: Self-pay | Admitting: Internal Medicine

## 2016-03-04 DIAGNOSIS — J310 Chronic rhinitis: Secondary | ICD-10-CM | POA: Diagnosis not present

## 2016-03-04 DIAGNOSIS — Z880 Allergy status to penicillin: Secondary | ICD-10-CM | POA: Diagnosis not present

## 2016-03-04 DIAGNOSIS — Z888 Allergy status to other drugs, medicaments and biological substances status: Secondary | ICD-10-CM | POA: Diagnosis not present

## 2016-03-04 DIAGNOSIS — H6123 Impacted cerumen, bilateral: Secondary | ICD-10-CM | POA: Diagnosis not present

## 2016-03-04 DIAGNOSIS — J339 Nasal polyp, unspecified: Secondary | ICD-10-CM | POA: Diagnosis not present

## 2016-03-04 DIAGNOSIS — J342 Deviated nasal septum: Secondary | ICD-10-CM | POA: Diagnosis not present

## 2016-03-04 DIAGNOSIS — J329 Chronic sinusitis, unspecified: Secondary | ICD-10-CM | POA: Diagnosis not present

## 2016-03-04 DIAGNOSIS — J328 Other chronic sinusitis: Secondary | ICD-10-CM | POA: Diagnosis not present

## 2016-04-01 DIAGNOSIS — H10431 Chronic follicular conjunctivitis, right eye: Secondary | ICD-10-CM | POA: Diagnosis not present

## 2016-04-01 DIAGNOSIS — B0051 Herpesviral iridocyclitis: Secondary | ICD-10-CM | POA: Diagnosis not present

## 2016-04-01 DIAGNOSIS — Z9889 Other specified postprocedural states: Secondary | ICD-10-CM | POA: Diagnosis not present

## 2016-04-01 DIAGNOSIS — Z8669 Personal history of other diseases of the nervous system and sense organs: Secondary | ICD-10-CM | POA: Diagnosis not present

## 2016-04-01 DIAGNOSIS — D3131 Benign neoplasm of right choroid: Secondary | ICD-10-CM | POA: Diagnosis not present

## 2016-04-01 DIAGNOSIS — H4041X2 Glaucoma secondary to eye inflammation, right eye, moderate stage: Secondary | ICD-10-CM | POA: Diagnosis not present

## 2016-04-01 DIAGNOSIS — H40041 Steroid responder, right eye: Secondary | ICD-10-CM | POA: Diagnosis not present

## 2016-04-01 DIAGNOSIS — Z9841 Cataract extraction status, right eye: Secondary | ICD-10-CM | POA: Diagnosis not present

## 2016-04-01 DIAGNOSIS — Z961 Presence of intraocular lens: Secondary | ICD-10-CM | POA: Diagnosis not present

## 2016-04-01 DIAGNOSIS — H2512 Age-related nuclear cataract, left eye: Secondary | ICD-10-CM | POA: Diagnosis not present

## 2016-04-01 LAB — HM DIABETES EYE EXAM

## 2016-04-08 ENCOUNTER — Telehealth: Payer: Self-pay

## 2016-04-08 ENCOUNTER — Other Ambulatory Visit: Payer: Self-pay

## 2016-04-08 DIAGNOSIS — M5431 Sciatica, right side: Secondary | ICD-10-CM

## 2016-04-08 MED ORDER — HYDROCODONE-ACETAMINOPHEN 10-325 MG PO TABS: ORAL_TABLET | ORAL | 0 refills | Status: DC

## 2016-04-08 NOTE — Telephone Encounter (Signed)
I called patient to let her know that there is a prescription for Norco 10-325 mg tablets ready to be picked up. Prescription was placed in filing cabinet at front desk.

## 2016-04-23 ENCOUNTER — Telehealth: Payer: Self-pay | Admitting: Internal Medicine

## 2016-04-23 NOTE — Telephone Encounter (Signed)
left voice mail message asking her to call me back to schedule her AWV, VDM (dee-dee)

## 2016-04-30 ENCOUNTER — Other Ambulatory Visit: Payer: Medicare Other

## 2016-04-30 DIAGNOSIS — E119 Type 2 diabetes mellitus without complications: Secondary | ICD-10-CM | POA: Diagnosis not present

## 2016-04-30 LAB — COMPREHENSIVE METABOLIC PANEL
ALBUMIN: 3.9 g/dL (ref 3.6–5.1)
ALT: 20 U/L (ref 6–29)
AST: 19 U/L (ref 10–35)
Alkaline Phosphatase: 56 U/L (ref 33–130)
BUN: 13 mg/dL (ref 7–25)
CALCIUM: 9.5 mg/dL (ref 8.6–10.4)
CHLORIDE: 102 mmol/L (ref 98–110)
CO2: 28 mmol/L (ref 20–31)
Creat: 0.64 mg/dL (ref 0.60–0.93)
GLUCOSE: 112 mg/dL — AB (ref 65–99)
POTASSIUM: 4.4 mmol/L (ref 3.5–5.3)
Sodium: 139 mmol/L (ref 135–146)
Total Bilirubin: 0.5 mg/dL (ref 0.2–1.2)
Total Protein: 6.8 g/dL (ref 6.1–8.1)

## 2016-04-30 LAB — HEMOGLOBIN A1C
Hgb A1c MFr Bld: 5.5 % (ref <5.7)
MEAN PLASMA GLUCOSE: 111 mg/dL

## 2016-05-05 ENCOUNTER — Ambulatory Visit (INDEPENDENT_AMBULATORY_CARE_PROVIDER_SITE_OTHER): Payer: Medicare Other | Admitting: Internal Medicine

## 2016-05-05 VITALS — BP 138/86 | HR 67 | Temp 98.4°F | Ht 64.0 in | Wt 229.8 lb

## 2016-05-05 DIAGNOSIS — R1314 Dysphagia, pharyngoesophageal phase: Secondary | ICD-10-CM | POA: Diagnosis not present

## 2016-05-05 DIAGNOSIS — E6609 Other obesity due to excess calories: Secondary | ICD-10-CM

## 2016-05-05 DIAGNOSIS — M5431 Sciatica, right side: Secondary | ICD-10-CM

## 2016-05-05 DIAGNOSIS — C4361 Malignant melanoma of right upper limb, including shoulder: Secondary | ICD-10-CM

## 2016-05-05 DIAGNOSIS — H409 Unspecified glaucoma: Secondary | ICD-10-CM | POA: Insufficient documentation

## 2016-05-05 DIAGNOSIS — Z6839 Body mass index (BMI) 39.0-39.9, adult: Secondary | ICD-10-CM

## 2016-05-05 DIAGNOSIS — E119 Type 2 diabetes mellitus without complications: Secondary | ICD-10-CM | POA: Diagnosis not present

## 2016-05-05 DIAGNOSIS — Z23 Encounter for immunization: Secondary | ICD-10-CM

## 2016-05-05 NOTE — Progress Notes (Signed)
Facility  [facility]    Place of Service:   OFFICE    Allergies  Allergen Reactions  . Adhesive [Tape] Dermatitis  . Betimol [Timolol Maleate]     Allergic to preservatives   . Cosopt [Dorzolamide Hcl-Timolol Mal] Other (See Comments)    Turns eye red, increases eye pressure  . Penicillins   . Tamoxifen Other (See Comments)    Vaginal bleeding  . Thimerosal     Makes pt eye red     Chief Complaint  Patient presents with  . Medical Management of Chronic Issues    medication management blood sugar, cholesterol, review labs. (last seen 04/01/15)    HPI:  Type 2 diabetes mellitus without complication, without long-term current use of insulin (HCC) - well controlled on diet  Dysphagia, pharyngoesophageal phase - improved with ranitidine. Previous hoarseness has also cleared up.  Class 2 obesity due to excess calories without serious comorbidity with body mass index (BMI) of 39.0 to 39.9 in adult - not following diet. Comfort eatoing due to some family stress.  Malignant melanoma of right upper extremity (HCC) - size unchanged. Has mri and CXR scheduled on 10/13 17. Painless.   Sciatica of right side - resolvedsince using Hydrocodone at night.  Gastroesophageal reflux disease without esophagitis - asymptomatic on ranitidine    Medications: Patient's Medications  New Prescriptions   No medications on file  Previous Medications   BETAXOLOL (BETOPTIC-S) 0.5 % OPHTHALMIC SUSPENSION    Place 1 drop into the right eye 2 (two) times daily.   BRIMONIDINE (ALPHAGAN P) 0.1 % SOLN    One drop in right eye twice daily   CALCIUM CITRATE-VITAMIN D 250-200 MG-UNIT TABS    Take 250 mg by mouth 2 (two) times daily.   CHOLECALCIFEROL (VITAMIN D3) 2000 UNITS TABS    Take by mouth daily.   CRANBERRY 1000 MG CAPS    Take 1 capsule by mouth daily.   CYANOCOBALAMIN (B-12) 1000 MCG CAPS    Take by mouth daily.   HYDROCODONE-ACETAMINOPHEN (NORCO) 10-325 MG TABLET    Take one tablet by mouth every  8 hours as needed for pain   LORAZEPAM (ATIVAN) 1 MG TABLET    1/2 by mouth at night   LOTEMAX 0.5 % OPHTHALMIC SUSPENSION    Place 1 drop into the right eye 2 (two) times daily.    NON FORMULARY    artificial tears 1% one drop as needed in right eye   OMEPRAZOLE (PRILOSEC) 20 MG CAPSULE    Take one tablet daily for reflux   PREDNISOLONE ACETATE (PRED FORTE) 1 % OPHTHALMIC SUSPENSION    Place 1 drop into the right eye every other day.    RANITIDINE (ZANTAC) 300 MG TABLET    Take one tablet at bedtime for reflux   TRIAMCINOLONE CREAM (KENALOG) 0.1 %    Apply to affected areas twice daily as needed for itching   TURMERIC 500 MG CAPS    Take 500 mg by mouth. 1 by mouth daily   VALACYCLOVIR (VALTREX) 1000 MG TABLET    Take 1,000 mg by mouth daily.   Modified Medications   No medications on file  Discontinued Medications   ALENDRONATE (FOSAMAX) 35 MG TABLET    TAKE ONE TABLET BY MOUTH EVERY 7 DAYS. TAKE WITH A FULL GLASS OF WATER ON AN EMPTY STOMACH.   MUPIROCIN OINTMENT (BACTROBAN) 2 %    Place 1 application into the nose 2 (two) times daily.   PYRIDOXINE  HCL (B-6 PO)    Take 1,000 mg by mouth daily.    Review of Systems  Constitutional: Positive for appetite change. Negative for chills and fever.       Decreased appetite  HENT: Positive for dental problem. Negative for congestion, ear pain and rhinorrhea.   Eyes:       History of glaucoma. History of cataracts. History of a torn retina in the right eye 03/06/2010 associated with a dense vitreous hemorrhage. She had laser therapy and cryotherapy to the right. History of herpes zoster of the right. Takes Valtrex continuously. Ptosis right eye followed shingles.  Respiratory: Negative for cough and chest tightness.   Cardiovascular: Negative for chest pain, palpitations and leg swelling.  Gastrointestinal: Negative for diarrhea.       History mild dysphagia. Food seems to hang in the chest.  Genitourinary: Negative for dysuria, enuresis, flank  pain, frequency, genital sores, hematuria and urgency.       History of ovarian cysts CT scan November 2002.  Musculoskeletal: Positive for myalgias.       Chronic back pains. Pain down the right leg suggestive of sciatica. Mild arthralgias in her hips and knees. Stable balance and gait.biopsy-proven malignant melanoma of the right wrist for which she has received chemotherapy with ipilimumab. Mass has not shrunk, but is not any larger.  Skin: Negative for pallor, rash and wound.       SK right hip and other places. Cystic mass of the right wrist that has been labeled melanoma.  Neurological: Negative for headaches.  Hematological: Negative.   Psychiatric/Behavioral: Negative.  Negative for agitation, behavioral problems, confusion and sleep disturbance.    Vitals:   05/05/16 1337  BP: (!) 152/84  Pulse: 67  Temp: 98.4 F (36.9 C)  TempSrc: Oral  SpO2: 97%  Weight: 229 lb 12.8 oz (104.2 kg)  Height: '5\' 4"'$  (1.626 m)   Body mass index is 39.45 kg/m. Wt Readings from Last 3 Encounters:  05/05/16 229 lb 12.8 oz (104.2 kg)  04/01/15 215 lb (97.5 kg)  11/26/14 224 lb (101.6 kg)      Physical Exam  Constitutional: She is oriented to person, place, and time. She appears well-developed and well-nourished. No distress.  Moderately overweight.  HENT:  Head: Normocephalic and atraumatic.  Right Ear: External ear normal.  Left Ear: External ear normal.  Nose: Nose normal.  Mouth/Throat: Oropharynx is clear and moist.  Dentures.  Eyes: Conjunctivae and EOM are normal. Pupils are equal, round, and reactive to light.  Ptosis of the right eye.  Neck: Neck supple. No JVD present. No tracheal deviation present. No thyromegaly present.  Cardiovascular: Normal rate, normal heart sounds and intact distal pulses.  Exam reveals no gallop and no friction rub.   No murmur heard. Pulmonary/Chest: Breath sounds normal. No respiratory distress. She has no wheezes. She has no rales.  Abdominal:  Bowel sounds are normal. She exhibits no distension and no mass. There is no tenderness.  Musculoskeletal: Normal range of motion. She exhibits no edema or tenderness.  Biopsy-proven melanoma mass of the medial side of the right wrist.  Lymphadenopathy:    She has no cervical adenopathy.  Neurological: She is alert and oriented to person, place, and time. She has normal reflexes.  04/24/14 MMSE 29/30. Passed clock drawing.  Skin: No rash noted. No erythema. No pallor.  SK right hip. Lipoma of left axilla medially. Firm cystic feeling melanoma mass right wrist ulnar aspect. Firm nodule at the left wrist  radially.  Psychiatric: She has a normal mood and affect. Her behavior is normal. Judgment and thought content normal.    Labs reviewed: Lab Summary Latest Ref Rng & Units 04/30/2016 03/27/2015 10/21/2014  Hemoglobin 13.0-17.0 g/dL (None) (None) (None)  Hematocrit 39.0-52.0 % (None) (None) (None)  White count - (None) (None) (None)  Platelet count - (None) (None) (None)  Sodium 135 - 146 mmol/L 139 141 140  Potassium 3.5 - 5.3 mmol/L 4.4 4.4 4.6  Calcium 8.6 - 10.4 mg/dL 9.5 9.4 9.4  Phosphorus - (None) (None) (None)  Creatinine 0.60 - 0.93 mg/dL 0.64 0.58 0.62  AST 10 - 35 U/L '19 16 17  '$ Alk Phos 33 - 130 U/L 56 57 61  Bilirubin 0.2 - 1.2 mg/dL 0.5 0.3 0.3  Glucose 65 - 99 mg/dL 112(H) 103(H) 116(H)  Cholesterol - (None) (None) (None)  HDL cholesterol >39 mg/dL (None) 64 63  Triglycerides 0 - 149 mg/dL (None) 159(H) 187(H)  LDL Direct - (None) (None) (None)  LDL Calc 0 - 99 mg/dL (None) 133(H) 174(H)  Total protein 6.1 - 8.1 g/dL 6.8 (None) (None)  Albumin 3.6 - 5.1 g/dL 3.9 3.9 4.1  Some recent data might be hidden   No results found for: TSH, T3TOTAL, T4TOTAL, THYROIDAB Lab Results  Component Value Date   BUN 13 04/30/2016   BUN 11 03/27/2015   BUN 15 10/21/2014   Lab Results  Component Value Date   HGBA1C 5.5 04/30/2016   HGBA1C 5.8 (H) 03/27/2015     Assessment/Plan  1. Type 2 diabetes mellitus without complication, without long-term current use of insulin (HCC) - Hemoglobin A1c; Future - Basic metabolic panel; Future  2. Dysphagia, pharyngoesophageal phase improved  3. Class 2 obesity due to excess calories without serious comorbidity with body mass index (BMI) of 39.0 to 39.9 in adult Encouraged weight loss  4. Malignant melanoma of right upper extremity ([facility]) Continue follow up at [facility]  5. Sciatica of right side resolved

## 2016-05-07 DIAGNOSIS — D480 Neoplasm of uncertain behavior of bone and articular cartilage: Secondary | ICD-10-CM | POA: Diagnosis not present

## 2016-05-07 DIAGNOSIS — C4361 Malignant melanoma of right upper limb, including shoulder: Secondary | ICD-10-CM | POA: Diagnosis not present

## 2016-05-07 DIAGNOSIS — Z0389 Encounter for observation for other suspected diseases and conditions ruled out: Secondary | ICD-10-CM | POA: Diagnosis not present

## 2016-05-11 DIAGNOSIS — Z08 Encounter for follow-up examination after completed treatment for malignant neoplasm: Secondary | ICD-10-CM | POA: Diagnosis not present

## 2016-05-11 DIAGNOSIS — R7303 Prediabetes: Secondary | ICD-10-CM | POA: Diagnosis not present

## 2016-05-11 DIAGNOSIS — D492 Neoplasm of unspecified behavior of bone, soft tissue, and skin: Secondary | ICD-10-CM | POA: Diagnosis not present

## 2016-05-11 DIAGNOSIS — Z888 Allergy status to other drugs, medicaments and biological substances status: Secondary | ICD-10-CM | POA: Diagnosis not present

## 2016-05-11 DIAGNOSIS — Z8709 Personal history of other diseases of the respiratory system: Secondary | ICD-10-CM | POA: Diagnosis not present

## 2016-05-11 DIAGNOSIS — C799 Secondary malignant neoplasm of unspecified site: Secondary | ICD-10-CM | POA: Diagnosis not present

## 2016-05-11 DIAGNOSIS — C4361 Malignant melanoma of right upper limb, including shoulder: Secondary | ICD-10-CM | POA: Diagnosis not present

## 2016-05-11 DIAGNOSIS — E064 Drug-induced thyroiditis: Secondary | ICD-10-CM | POA: Diagnosis not present

## 2016-05-11 DIAGNOSIS — Z880 Allergy status to penicillin: Secondary | ICD-10-CM | POA: Diagnosis not present

## 2016-05-11 DIAGNOSIS — Z853 Personal history of malignant neoplasm of breast: Secondary | ICD-10-CM | POA: Diagnosis not present

## 2016-05-11 DIAGNOSIS — G4700 Insomnia, unspecified: Secondary | ICD-10-CM | POA: Diagnosis not present

## 2016-06-14 DIAGNOSIS — H4041X2 Glaucoma secondary to eye inflammation, right eye, moderate stage: Secondary | ICD-10-CM | POA: Diagnosis not present

## 2016-06-14 DIAGNOSIS — Z888 Allergy status to other drugs, medicaments and biological substances status: Secondary | ICD-10-CM | POA: Diagnosis not present

## 2016-06-14 DIAGNOSIS — D3131 Benign neoplasm of right choroid: Secondary | ICD-10-CM | POA: Diagnosis not present

## 2016-06-14 DIAGNOSIS — Z760 Encounter for issue of repeat prescription: Secondary | ICD-10-CM | POA: Diagnosis not present

## 2016-06-14 DIAGNOSIS — B0051 Herpesviral iridocyclitis: Secondary | ICD-10-CM | POA: Diagnosis not present

## 2016-06-14 DIAGNOSIS — Z961 Presence of intraocular lens: Secondary | ICD-10-CM | POA: Diagnosis not present

## 2016-06-14 DIAGNOSIS — H40041 Steroid responder, right eye: Secondary | ICD-10-CM | POA: Diagnosis not present

## 2016-06-14 DIAGNOSIS — Z9841 Cataract extraction status, right eye: Secondary | ICD-10-CM | POA: Diagnosis not present

## 2016-06-14 DIAGNOSIS — Z880 Allergy status to penicillin: Secondary | ICD-10-CM | POA: Diagnosis not present

## 2016-06-14 DIAGNOSIS — H209 Unspecified iridocyclitis: Secondary | ICD-10-CM | POA: Diagnosis not present

## 2016-07-12 ENCOUNTER — Other Ambulatory Visit: Payer: Self-pay | Admitting: *Deleted

## 2016-07-12 DIAGNOSIS — M5431 Sciatica, right side: Secondary | ICD-10-CM

## 2016-07-12 MED ORDER — HYDROCODONE-ACETAMINOPHEN 10-325 MG PO TABS: ORAL_TABLET | ORAL | 0 refills | Status: DC

## 2016-07-12 NOTE — Telephone Encounter (Signed)
Patient requested and will pick up 

## 2016-07-29 DIAGNOSIS — J392 Other diseases of pharynx: Secondary | ICD-10-CM | POA: Diagnosis not present

## 2016-07-29 DIAGNOSIS — R6889 Other general symptoms and signs: Secondary | ICD-10-CM | POA: Diagnosis not present

## 2016-07-29 DIAGNOSIS — K219 Gastro-esophageal reflux disease without esophagitis: Secondary | ICD-10-CM | POA: Diagnosis not present

## 2016-07-29 DIAGNOSIS — R490 Dysphonia: Secondary | ICD-10-CM | POA: Diagnosis not present

## 2016-08-16 DIAGNOSIS — N6321 Unspecified lump in the left breast, upper outer quadrant: Secondary | ICD-10-CM | POA: Diagnosis not present

## 2016-08-16 DIAGNOSIS — C50911 Malignant neoplasm of unspecified site of right female breast: Secondary | ICD-10-CM | POA: Diagnosis not present

## 2016-08-16 DIAGNOSIS — R928 Other abnormal and inconclusive findings on diagnostic imaging of breast: Secondary | ICD-10-CM | POA: Diagnosis not present

## 2016-08-16 DIAGNOSIS — Z853 Personal history of malignant neoplasm of breast: Secondary | ICD-10-CM | POA: Diagnosis not present

## 2016-08-16 DIAGNOSIS — C50519 Malignant neoplasm of lower-outer quadrant of unspecified female breast: Secondary | ICD-10-CM | POA: Diagnosis not present

## 2016-08-17 DIAGNOSIS — Z6839 Body mass index (BMI) 39.0-39.9, adult: Secondary | ICD-10-CM | POA: Diagnosis not present

## 2016-08-17 DIAGNOSIS — N6321 Unspecified lump in the left breast, upper outer quadrant: Secondary | ICD-10-CM | POA: Diagnosis not present

## 2016-08-17 DIAGNOSIS — Z923 Personal history of irradiation: Secondary | ICD-10-CM | POA: Diagnosis not present

## 2016-08-17 DIAGNOSIS — Z9221 Personal history of antineoplastic chemotherapy: Secondary | ICD-10-CM | POA: Diagnosis not present

## 2016-08-17 DIAGNOSIS — C50411 Malignant neoplasm of upper-outer quadrant of right female breast: Secondary | ICD-10-CM | POA: Diagnosis not present

## 2016-08-17 DIAGNOSIS — R3989 Other symptoms and signs involving the genitourinary system: Secondary | ICD-10-CM | POA: Diagnosis not present

## 2016-08-17 DIAGNOSIS — Z9889 Other specified postprocedural states: Secondary | ICD-10-CM | POA: Diagnosis not present

## 2016-08-17 DIAGNOSIS — Z85820 Personal history of malignant melanoma of skin: Secondary | ICD-10-CM | POA: Diagnosis not present

## 2016-08-17 DIAGNOSIS — E669 Obesity, unspecified: Secondary | ICD-10-CM | POA: Diagnosis not present

## 2016-08-17 DIAGNOSIS — R3915 Urgency of urination: Secondary | ICD-10-CM | POA: Diagnosis not present

## 2016-08-17 DIAGNOSIS — Z853 Personal history of malignant neoplasm of breast: Secondary | ICD-10-CM | POA: Diagnosis not present

## 2016-08-17 DIAGNOSIS — N6320 Unspecified lump in the left breast, unspecified quadrant: Secondary | ICD-10-CM | POA: Diagnosis not present

## 2016-08-17 DIAGNOSIS — Z170 Estrogen receptor positive status [ER+]: Secondary | ICD-10-CM | POA: Diagnosis not present

## 2016-08-17 DIAGNOSIS — Z08 Encounter for follow-up examination after completed treatment for malignant neoplasm: Secondary | ICD-10-CM | POA: Diagnosis not present

## 2016-08-17 DIAGNOSIS — M818 Other osteoporosis without current pathological fracture: Secondary | ICD-10-CM | POA: Diagnosis not present

## 2016-08-23 DIAGNOSIS — N6321 Unspecified lump in the left breast, upper outer quadrant: Secondary | ICD-10-CM | POA: Diagnosis not present

## 2016-08-23 DIAGNOSIS — C50911 Malignant neoplasm of unspecified site of right female breast: Secondary | ICD-10-CM | POA: Diagnosis not present

## 2016-08-23 DIAGNOSIS — Z853 Personal history of malignant neoplasm of breast: Secondary | ICD-10-CM | POA: Diagnosis not present

## 2016-08-23 DIAGNOSIS — N6082 Other benign mammary dysplasias of left breast: Secondary | ICD-10-CM | POA: Diagnosis not present

## 2016-08-23 DIAGNOSIS — N6012 Diffuse cystic mastopathy of left breast: Secondary | ICD-10-CM | POA: Diagnosis not present

## 2016-08-23 DIAGNOSIS — R928 Other abnormal and inconclusive findings on diagnostic imaging of breast: Secondary | ICD-10-CM | POA: Diagnosis not present

## 2016-08-26 DIAGNOSIS — D3131 Benign neoplasm of right choroid: Secondary | ICD-10-CM | POA: Diagnosis not present

## 2016-08-26 DIAGNOSIS — Z961 Presence of intraocular lens: Secondary | ICD-10-CM | POA: Diagnosis not present

## 2016-08-26 DIAGNOSIS — Z79899 Other long term (current) drug therapy: Secondary | ICD-10-CM | POA: Diagnosis not present

## 2016-08-26 DIAGNOSIS — H409 Unspecified glaucoma: Secondary | ICD-10-CM | POA: Diagnosis not present

## 2016-08-26 DIAGNOSIS — H4041X2 Glaucoma secondary to eye inflammation, right eye, moderate stage: Secondary | ICD-10-CM | POA: Diagnosis not present

## 2016-08-26 DIAGNOSIS — H40041 Steroid responder, right eye: Secondary | ICD-10-CM | POA: Diagnosis not present

## 2016-08-26 DIAGNOSIS — B0051 Herpesviral iridocyclitis: Secondary | ICD-10-CM | POA: Diagnosis not present

## 2016-08-26 DIAGNOSIS — Z9841 Cataract extraction status, right eye: Secondary | ICD-10-CM | POA: Diagnosis not present

## 2016-09-08 DIAGNOSIS — C4361 Malignant melanoma of right upper limb, including shoulder: Secondary | ICD-10-CM | POA: Diagnosis not present

## 2016-09-14 DIAGNOSIS — Z9889 Other specified postprocedural states: Secondary | ICD-10-CM | POA: Diagnosis not present

## 2016-09-14 DIAGNOSIS — C4361 Malignant melanoma of right upper limb, including shoulder: Secondary | ICD-10-CM | POA: Diagnosis not present

## 2016-09-14 DIAGNOSIS — G4700 Insomnia, unspecified: Secondary | ICD-10-CM | POA: Diagnosis not present

## 2016-09-14 DIAGNOSIS — Z8709 Personal history of other diseases of the respiratory system: Secondary | ICD-10-CM | POA: Diagnosis not present

## 2016-09-14 DIAGNOSIS — Z853 Personal history of malignant neoplasm of breast: Secondary | ICD-10-CM | POA: Diagnosis not present

## 2016-09-14 DIAGNOSIS — Z888 Allergy status to other drugs, medicaments and biological substances status: Secondary | ICD-10-CM | POA: Diagnosis not present

## 2016-09-14 DIAGNOSIS — R739 Hyperglycemia, unspecified: Secondary | ICD-10-CM | POA: Diagnosis not present

## 2016-09-14 DIAGNOSIS — Z923 Personal history of irradiation: Secondary | ICD-10-CM | POA: Diagnosis not present

## 2016-09-14 DIAGNOSIS — C799 Secondary malignant neoplasm of unspecified site: Secondary | ICD-10-CM | POA: Diagnosis not present

## 2016-09-14 DIAGNOSIS — Z880 Allergy status to penicillin: Secondary | ICD-10-CM | POA: Diagnosis not present

## 2016-09-20 ENCOUNTER — Telehealth: Payer: Self-pay

## 2016-09-20 ENCOUNTER — Other Ambulatory Visit: Payer: Self-pay | Admitting: *Deleted

## 2016-09-20 DIAGNOSIS — M5431 Sciatica, right side: Secondary | ICD-10-CM

## 2016-09-20 MED ORDER — HYDROCODONE-ACETAMINOPHEN 10-325 MG PO TABS: ORAL_TABLET | ORAL | 0 refills | Status: DC

## 2016-09-20 NOTE — Telephone Encounter (Signed)
I called patient to let her know that she has a prescription ready to be picked up at the office. Rx is for hydrocodone/apap 10-325 mg. #90.   Left message on patient's voicemail. Rx was placed in filing cabinet at front desk.

## 2016-09-20 NOTE — Telephone Encounter (Signed)
Patient requested and will pick up 

## 2016-10-21 DIAGNOSIS — Z880 Allergy status to penicillin: Secondary | ICD-10-CM | POA: Diagnosis not present

## 2016-10-21 DIAGNOSIS — H209 Unspecified iridocyclitis: Secondary | ICD-10-CM | POA: Diagnosis not present

## 2016-10-21 DIAGNOSIS — H4041X2 Glaucoma secondary to eye inflammation, right eye, moderate stage: Secondary | ICD-10-CM | POA: Diagnosis not present

## 2016-10-21 DIAGNOSIS — H25812 Combined forms of age-related cataract, left eye: Secondary | ICD-10-CM | POA: Diagnosis not present

## 2016-10-21 DIAGNOSIS — Z9841 Cataract extraction status, right eye: Secondary | ICD-10-CM | POA: Diagnosis not present

## 2016-10-21 DIAGNOSIS — D3131 Benign neoplasm of right choroid: Secondary | ICD-10-CM | POA: Diagnosis not present

## 2016-10-21 DIAGNOSIS — Z961 Presence of intraocular lens: Secondary | ICD-10-CM | POA: Diagnosis not present

## 2016-10-21 DIAGNOSIS — H40041 Steroid responder, right eye: Secondary | ICD-10-CM | POA: Diagnosis not present

## 2016-10-21 DIAGNOSIS — Z888 Allergy status to other drugs, medicaments and biological substances status: Secondary | ICD-10-CM | POA: Diagnosis not present

## 2016-10-29 ENCOUNTER — Ambulatory Visit (INDEPENDENT_AMBULATORY_CARE_PROVIDER_SITE_OTHER): Payer: Medicare Other

## 2016-10-29 ENCOUNTER — Other Ambulatory Visit: Payer: Medicare Other

## 2016-10-29 VITALS — BP 112/74 | HR 81 | Temp 97.9°F | Ht 64.0 in | Wt 233.0 lb

## 2016-10-29 DIAGNOSIS — E784 Other hyperlipidemia: Secondary | ICD-10-CM | POA: Diagnosis not present

## 2016-10-29 DIAGNOSIS — E119 Type 2 diabetes mellitus without complications: Secondary | ICD-10-CM

## 2016-10-29 DIAGNOSIS — Z0000 Encounter for general adult medical examination without abnormal findings: Secondary | ICD-10-CM | POA: Diagnosis not present

## 2016-10-29 DIAGNOSIS — Z23 Encounter for immunization: Secondary | ICD-10-CM | POA: Diagnosis not present

## 2016-10-29 DIAGNOSIS — E7849 Other hyperlipidemia: Secondary | ICD-10-CM

## 2016-10-29 LAB — LIPID PANEL
CHOL/HDL RATIO: 3.9 ratio (ref <5.0)
Cholesterol: 236 mg/dL — ABNORMAL HIGH (ref <200)
HDL: 60 mg/dL (ref >50)
LDL CALC: 146 mg/dL — AB (ref <100)
TRIGLYCERIDES: 149 mg/dL (ref <150)
VLDL: 30 mg/dL (ref <30)

## 2016-10-29 LAB — BASIC METABOLIC PANEL
BUN: 15 mg/dL (ref 7–25)
CHLORIDE: 102 mmol/L (ref 98–110)
CO2: 31 mmol/L (ref 20–31)
Calcium: 9.6 mg/dL (ref 8.6–10.4)
Creat: 0.59 mg/dL — ABNORMAL LOW (ref 0.60–0.93)
Glucose, Bld: 112 mg/dL — ABNORMAL HIGH (ref 65–99)
POTASSIUM: 4.4 mmol/L (ref 3.5–5.3)
SODIUM: 139 mmol/L (ref 135–146)

## 2016-10-29 NOTE — Progress Notes (Signed)
   I reviewed health advisor's note, was available for consultation and agree with the assessment and plan as written.    Yorley Buch L. Reyli Schroth, D.O. [facility] [address], [place] [address] Cell Phone (Mon-Fri 8am-5pm):  [phone] On Call:  [phone] & follow prompts after 5pm & weekends Office Phone:  [phone] Office Fax:  [phone]   Quick Notes   Health Maintenance: PN23 (2/2) given today. Foot exam, hep c, urine due.     Abnormal Screen: MMSE 30/30. Passed clock drawing.     Patient Concerns: None     Nurse Concerns: None

## 2016-10-29 NOTE — Patient Instructions (Addendum)
[name] , Thank you for taking time to come for your Medicare Wellness Visit. I appreciate your ongoing commitment to your health goals. Please review the following plan we discussed and let me know if I can assist you in the future.   Screening recommendations/referrals: Colonoscopy due 02/2021 Mammogram due 07/2017 Bone Density up to date Recommended yearly ophthalmology/optometry visit for glaucoma screening and checkup Recommended yearly dental visit for hygiene and checkup  Vaccinations: Influenza vaccine up to date Pneumococcal vaccine up to date. Pn23 given today. Tdap vaccine due 07/2018 Shingles vaccine up to date. If you decide you want the new one give [place] a call and we will put in prescription.  Advanced directives: Advance directive discussed with you today. I have provided a copy for you to complete at home and have notarized. Once this is complete please bring a copy in to our office so we can scan it into your chart.   Conditions/risks identified: None  Next appointment: Dr. Nyoka Cowden 4/11 @1pm    Preventive Care 65 Years and Older, Female Preventive care refers to lifestyle choices and visits with your health care provider that can promote health and wellness. What does preventive care include?  A yearly physical exam. This is also called an annual well check.  Dental exams once or twice a year.  Routine eye exams. Ask your health care provider how often you should have your eyes checked.  Personal lifestyle choices, including:  Daily care of your teeth and gums.  Regular physical activity.  Eating a healthy diet.  Avoiding tobacco and drug use.  Limiting alcohol use.  Practicing safe sex.  Taking low-dose aspirin every day.  Taking vitamin and mineral supplements as recommended by your health care provider. What happens during an annual well check? The services and screenings done by your health care provider during your annual well check will depend on  your age, overall health, lifestyle risk factors, and family history of disease. Counseling  Your health care provider may ask you questions about your:  Alcohol use.  Tobacco use.  Drug use.  Emotional well-being.  Home and relationship well-being.  Sexual activity.  Eating habits.  History of falls.  Memory and ability to understand (cognition).  Work and work Statistician.  Reproductive health. Screening  You may have the following tests or measurements:  Height, weight, and BMI.  Blood pressure.  Lipid and cholesterol levels. These may be checked every 5 years, or more frequently if you are over 74 years old.  Skin check.  Lung cancer screening. You may have this screening every year starting at age 32 if you have a 30-pack-year history of smoking and currently smoke or have quit within the past 15 years.  Fecal occult blood test (FOBT) of the stool. You may have this test every year starting at age 34.  Flexible sigmoidoscopy or colonoscopy. You may have a sigmoidoscopy every 5 years or a colonoscopy every 10 years starting at age 32.  Hepatitis C blood test.  Hepatitis B blood test.  Sexually transmitted disease (STD) testing.  Diabetes screening. This is done by checking your blood sugar (glucose) after you have not eaten for a while (fasting). You may have this done every 1-3 years.  Bone density scan. This is done to screen for osteoporosis. You may have this done starting at age 46.  Mammogram. This may be done every 1-2 years. Talk to your health care provider about how often you should have regular mammograms. Talk with  your health care provider about your test results, treatment options, and if necessary, the need for more tests. Vaccines  Your health care provider may recommend certain vaccines, such as:  Influenza vaccine. This is recommended every year.  Tetanus, diphtheria, and acellular pertussis (Tdap, Td) vaccine. You may need a Td booster  every 10 years.  Zoster vaccine. You may need this after age 109.  Pneumococcal 13-valent conjugate (PCV13) vaccine. One dose is recommended after age 59.  Pneumococcal polysaccharide (PPSV23) vaccine. One dose is recommended after age 68. Talk to your health care provider about which screenings and vaccines you need and how often you need them. This information is not intended to replace advice given to you by your health care provider. Make sure you discuss any questions you have with your health care provider. Document Released: 08/08/2015 Document Revised: 03/31/2016 Document Reviewed: 05/13/2015 Elsevier Interactive Patient Education  2017 [place] Prevention in the Home Falls can cause injuries. They can happen to people of all ages. There are many things you can do to make your home safe and to help prevent falls. What can I do on the outside of my home?  Regularly fix the edges of walkways and driveways and fix any cracks.  Remove anything that might make you trip as you walk through a door, such as a raised step or threshold.  Trim any bushes or trees on the path to your home.  Use bright outdoor lighting.  Clear any walking paths of anything that might make someone trip, such as rocks or tools.  Regularly check to see if handrails are loose or broken. Make sure that both sides of any steps have handrails.  Any raised decks and porches should have guardrails on the edges.  Have any leaves, snow, or ice cleared regularly.  Use sand or salt on walking paths during winter.  Clean up any spills in your garage right away. This includes oil or grease spills. What can I do in the bathroom?  Use night lights.  Install grab bars by the toilet and in the tub and shower. Do not use towel bars as grab bars.  Use non-skid mats or decals in the tub or shower.  If you need to sit down in the shower, use a plastic, non-slip stool.  Keep the floor dry. Clean up any  water that spills on the floor as soon as it happens.  Remove soap buildup in the tub or shower regularly.  Attach bath mats securely with double-sided non-slip rug tape.  Do not have throw rugs and other things on the floor that can make you trip. What can I do in the bedroom?  Use night lights.  Make sure that you have a light by your bed that is easy to reach.  Do not use any sheets or blankets that are too big for your bed. They should not hang down onto the floor.  Have a firm chair that has side arms. You can use this for support while you get dressed.  Do not have throw rugs and other things on the floor that can make you trip. What can I do in the kitchen?  Clean up any spills right away.  Avoid walking on wet floors.  Keep items that you use a lot in easy-to-reach places.  If you need to reach something above you, use a strong step stool that has a grab bar.  Keep electrical cords out of the way.  Do not  use floor polish or wax that makes floors slippery. If you must use wax, use non-skid floor wax.  Do not have throw rugs and other things on the floor that can make you trip. What can I do with my stairs?  Do not leave any items on the stairs.  Make sure that there are handrails on both sides of the stairs and use them. Fix handrails that are broken or loose. Make sure that handrails are as long as the stairways.  Check any carpeting to make sure that it is firmly attached to the stairs. Fix any carpet that is loose or worn.  Avoid having throw rugs at the top or bottom of the stairs. If you do have throw rugs, attach them to the floor with carpet tape.  Make sure that you have a light switch at the top of the stairs and the bottom of the stairs. If you do not have them, ask someone to add them for you. What else can I do to help prevent falls?  Wear shoes that:  Do not have high heels.  Have rubber bottoms.  Are comfortable and fit you well.  Are closed  at the toe. Do not wear sandals.  If you use a stepladder:  Make sure that it is fully opened. Do not climb a closed stepladder.  Make sure that both sides of the stepladder are locked into place.  Ask someone to hold it for you, if possible.  Clearly mark and make sure that you can see:  Any grab bars or handrails.  First and last steps.  Where the edge of each step is.  Use tools that help you move around (mobility aids) if they are needed. These include:  Canes.  Walkers.  Scooters.  Crutches.  Turn on the lights when you go into a dark area. Replace any light bulbs as soon as they burn out.  Set up your furniture so you have a clear path. Avoid moving your furniture around.  If any of your floors are uneven, fix them.  If there are any pets around you, be aware of where they are.  Review your medicines with your doctor. Some medicines can make you feel dizzy. This can increase your chance of falling. Ask your doctor what other things that you can do to help prevent falls. This information is not intended to replace advice given to you by your health care provider. Make sure you discuss any questions you have with your health care provider. Document Released: 05/08/2009 Document Revised: 12/18/2015 Document Reviewed: 08/16/2014 Elsevier Interactive Patient Education  2017 [employer].

## 2016-10-29 NOTE — Progress Notes (Signed)
Subjective:   [name] is a 71 y.o. female who presents for an Initial Medicare Annual Wellness Visit.      Objective:    Today's Vitals   10/29/16 1053  BP: 112/74  Pulse: 81  Temp: 97.9 F (36.6 C)  TempSrc: Oral  SpO2: 92%  Weight: 233 lb (105.7 kg)  Height: 5\' 4"  (1.626 m)   Body mass index is 39.99 kg/m.   Current Medications (verified) Outpatient Encounter Prescriptions as of 10/29/2016  Medication Sig  . Calcium Citrate-Vitamin D 250-200 MG-UNIT TABS Take 250 mg by mouth 2 (two) times daily.  . Cholecalciferol (VITAMIN D3) 2000 UNITS TABS Take by mouth daily.  . Cranberry 1000 MG CAPS Take 1 capsule by mouth daily.  . Cyanocobalamin (B-12) 1000 MCG CAPS Take by mouth daily.  Marland Kitchen HYDROcodone-acetaminophen (NORCO) 10-325 MG tablet Take one tablet by mouth every 8 hours as needed for pain  . LORazepam (ATIVAN) 1 MG tablet 1/2 by mouth at night  . NON FORMULARY artificial tears 1% one drop as needed in right eye  . omeprazole (PRILOSEC) 20 MG capsule Take one tablet daily for reflux  . ranitidine (ZANTAC) 300 MG tablet Take one tablet at bedtime for reflux  . Turmeric 500 MG CAPS Take 500 mg by mouth. 1 by mouth daily  . valACYclovir (VALTREX) 1000 MG tablet Take 1,000 mg by mouth daily.   . [DISCONTINUED] betaxolol (BETOPTIC-S) 0.5 % ophthalmic suspension Place 1 drop into the right eye 2 (two) times daily.  . [DISCONTINUED] brimonidine (ALPHAGAN P) 0.1 % SOLN One drop in right eye twice daily  . [DISCONTINUED] LOTEMAX 0.5 % ophthalmic suspension Place 1 drop into the right eye 2 (two) times daily.   . [DISCONTINUED] prednisoLONE acetate (PRED FORTE) 1 % ophthalmic suspension Place 1 drop into the right eye every other day.   . [DISCONTINUED] triamcinolone cream (KENALOG) 0.1 % Apply to affected areas twice daily as needed for itching   No facility-administered encounter medications on file as of 10/29/2016.     Allergies (verified) Adhesive [tape]; Betimol  [timolol maleate]; Cosopt [dorzolamide hcl-timolol mal]; Penicillins; Tamoxifen; and Thimerosal   History: Past Medical History:  Diagnosis Date  . Asymptomatic varicose veins   . Cancer (HCC)    breast   . Diverticulosis   . DM type 2 (diabetes mellitus, type 2) ([place]) 10/23/2014  . Dysuria   . GERD (gastroesophageal reflux disease)   . Hiatal hernia   . Hyperlipidemia   . Malignant neoplasm of breast (female), unspecified site   . Migraine without aura, without mention of intractable migraine without mention of status migrainosus   . Other abnormal blood chemistry   . Pain in joint, pelvic region and thigh   . Shingles    in eyes  . Stricture and stenosis of esophagus   . Trigger finger (acquired)   . Unspecified cataract   . Unspecified glaucoma(365.9)    Past Surgical History:  Procedure Laterality Date  . APPENDECTOMY  1973   Dr. Linzie Collin  . BREAST LUMPECTOMY Right 08/29/2008   Dr. Erroll Luna  . ESOPHAGEAL DILATION    . NASAL POLYP EXCISION  10/2015  . POLYPECTOMY  11/20/14   Uterine Polyp Removed   Family History  Problem Relation Age of Onset  . Hypertension Mother   . Hyperlipidemia Mother   . Alzheimer's disease Mother   . Emphysema Father   . COPD Father    Social History   Occupational History  .  Not on file.   Social History Main Topics  . Smoking status: Never Smoker  . Smokeless tobacco: Never Used  . Alcohol use No  . Drug use: No  . Sexual activity: Not on file    Tobacco Counseling Counseling given: Not Answered   Activities of Daily Living In your present state of health, do you have any difficulty performing the following activities: 10/29/2016  Hearing? N  Vision? N  Difficulty concentrating or making decisions? N  Walking or climbing stairs? N  Dressing or bathing? N  Doing errands, shopping? N  Preparing Food and eating ? N  Using the Toilet? N  In the past six months, have you accidently leaked urine? N  Do you have problems  with loss of bowel control? N  Managing your Medications? N  Managing your Finances? N  Housekeeping or managing your Housekeeping? N  Some recent data might be hidden    Immunizations and Health Maintenance Immunization History  Administered Date(s) Administered  . Influenza Whole 07/26/2010  . Influenza,inj,Quad PF,36+ Mos 04/24/2014, 04/01/2015, 05/05/2016  . Influenza-Unspecified 04/25/2013  . Pneumococcal Conjugate-13 10/23/2014  . Tdap 07/26/2008  . Zoster 10/17/2013   Health Maintenance Due  Topic Date Due  . Hepatitis C Screening  June 23, 1946  . URINE MICROALBUMIN  07/03/1955  . PNA vac Low Risk Adult (2 of 2 - PPSV23) 10/23/2015  . FOOT EXAM  03/31/2016  . HEMOGLOBIN A1C  10/29/2016    Patient Care Team: Estill Dooms, MD as PCP - General (Internal Medicine) Harmon Dun, NP (Oncology) Chauncey Cruel, MD as Consulting Physician (Oncology) Erroll Luna, MD as Consulting Physician (General Surgery) Harvie Heck, MD as Physician Assistant (Internal Medicine) Chyrel Masson, DO as Anesthesiologist (Otolaryngology) Garen Grams, MD (Otolaryngology)  Indicate any recent Medical Services you may have received from other than [facility] providers in the past year (date may be approximate).     Assessment:   This is a routine wellness examination for [place].   Hearing/Vision screen No exam data present  Dietary issues and exercise activities discussed: Current Exercise Habits: The patient does not participate in regular exercise at present, Exercise limited by: None identified  Goals    . Exercise 3x per week (30 min per time)          Starting 10/29/2016 I would like to start walking 3 days a week for 1-2 miles.      Depression Screen PHQ 2/9 Scores 10/29/2016 05/05/2016 04/01/2015 10/23/2014 04/24/2014 02/13/2013 10/11/2012  PHQ - 2 Score 0 0 0 0 0 0 0    Fall Risk Fall Risk  10/29/2016 05/05/2016 04/01/2015 10/23/2014 04/24/2014  Falls in the past year? No No No No No      Cognitive Function: MMSE - Mini Mental State Exam 10/29/2016 04/24/2014  Orientation to time 5 5  Orientation to Place 5 5  Registration 3 3  Attention/ Calculation 5 5  Recall 3 2  Language- name 2 objects 2 2  Language- repeat 1 1  Language- follow 3 step command 3 3  Language- read & follow direction 1 1  Write a sentence 1 1  Copy design 1 1  Total score 30 29        Screening Tests Health Maintenance  Topic Date Due  . Hepatitis C Screening  1946-03-08  . URINE MICROALBUMIN  07/03/1955  . PNA vac Low Risk Adult (2 of 2 - PPSV23) 10/23/2015  . FOOT EXAM  03/31/2016  . HEMOGLOBIN  A1C  10/29/2016  . INFLUENZA VACCINE  02/23/2017  . OPHTHALMOLOGY EXAM  04/01/2017  . MAMMOGRAM  07/27/2017  . TETANUS/TDAP  07/26/2018  . COLONOSCOPY  03/26/2019  . DEXA SCAN  Completed      Plan:  I have personally reviewed and addressed the Medicare Annual Wellness questionnaire and have noted the following in the patient's chart:  A. Medical and social history B. Use of alcohol, tobacco or illicit drugs  C. Current medications and supplements D. Functional ability and status E.  Nutritional status F.  Physical activity G. Advance directives H. List of other physicians I.  Hospitalizations, surgeries, and ER visits in previous 12 months J.  [place] to include hearing, vision, cognitive, depression L. Referrals and appointments - none  In addition, I have reviewed and discussed with patient certain preventive protocols, quality metrics, and best practice recommendations. A written personalized care plan for preventive services as well as general preventive health recommendations were provided to patient.  See attached scanned questionnaire for additional information.   Signed,   Rich Reining, RN Nurse Health Advisor

## 2016-10-30 LAB — HEMOGLOBIN A1C
HEMOGLOBIN A1C: 5.5 % (ref <5.7)
MEAN PLASMA GLUCOSE: 111 mg/dL

## 2016-11-01 ENCOUNTER — Other Ambulatory Visit: Payer: Medicare Other

## 2016-11-01 ENCOUNTER — Ambulatory Visit: Payer: Medicare Other

## 2016-11-03 ENCOUNTER — Encounter: Payer: Self-pay | Admitting: Internal Medicine

## 2016-11-03 ENCOUNTER — Ambulatory Visit: Payer: Medicare Other

## 2016-11-03 ENCOUNTER — Ambulatory Visit (INDEPENDENT_AMBULATORY_CARE_PROVIDER_SITE_OTHER): Payer: Medicare Other | Admitting: Internal Medicine

## 2016-11-03 VITALS — BP 146/82 | HR 77 | Temp 98.0°F | Ht 64.0 in | Wt 234.4 lb

## 2016-11-03 DIAGNOSIS — G4700 Insomnia, unspecified: Secondary | ICD-10-CM

## 2016-11-03 DIAGNOSIS — R300 Dysuria: Secondary | ICD-10-CM | POA: Diagnosis not present

## 2016-11-03 DIAGNOSIS — M5431 Sciatica, right side: Secondary | ICD-10-CM

## 2016-11-03 DIAGNOSIS — E784 Other hyperlipidemia: Secondary | ICD-10-CM

## 2016-11-03 DIAGNOSIS — E7849 Other hyperlipidemia: Secondary | ICD-10-CM

## 2016-11-03 DIAGNOSIS — E119 Type 2 diabetes mellitus without complications: Secondary | ICD-10-CM

## 2016-11-03 DIAGNOSIS — C4361 Malignant melanoma of right upper limb, including shoulder: Secondary | ICD-10-CM | POA: Diagnosis not present

## 2016-11-03 MED ORDER — HYDROCODONE-ACETAMINOPHEN 10-325 MG PO TABS: ORAL_TABLET | ORAL | 0 refills | Status: DC

## 2016-11-03 MED ORDER — LORAZEPAM 1 MG PO TABS: ORAL_TABLET | ORAL | 2 refills | Status: DC

## 2016-11-03 NOTE — Progress Notes (Signed)
Facility  [place]    Place of Service:   OFFICE    Allergies  Allergen Reactions  . Adhesive [Tape] Dermatitis  . Betimol [Timolol Maleate]     Allergic to preservatives   . Cosopt [Dorzolamide Hcl-Timolol Mal] Other (See Comments)    Turns eye red, increases eye pressure  . Penicillins   . Tamoxifen Other (See Comments)    Vaginal bleeding  . Thimerosal     Makes pt eye red     Chief Complaint  Patient presents with  . Medical Management of Chronic Issues    Medical Management of Blood Sugar, Cholesterol    HPI:  Malignant melanoma of right upper extremity [facility]) - continues follow up at [facility]: Dr. Turner Daniels and Clarice Pole, PA. In Heme-Onc dept.  Breast cancer: had biopsy of left side Jan 2018. Reportedly benign.  Type 2 diabetes mellitus without complication, without long-term current use of insulin (HCC)  Other hyperlipidemia: borderline, but she has no other significant cardiovascular risk factors  Having dysuria. Aching sensation in the suprapubic area.  Medications: Patient's Medications  New Prescriptions   No medications on file  Previous Medications   ARTIFICIAL TEAR OP    Apply 1 % to eye daily. right eyes   CALCIUM CITRATE-VITAMIN D 250-200 MG-UNIT TABS    Take 250 mg by mouth 2 (two) times daily.   CHOLECALCIFEROL (VITAMIN D3) 2000 UNITS TABS    Take by mouth daily.   CRANBERRY 1000 MG CAPS    Take 1 capsule by mouth daily.   CYANOCOBALAMIN (B-12) 1000 MCG CAPS    Take by mouth daily.   FLUOROMETHOLONE (FML) 0.1 % OPHTHALMIC SUSPENSION    Place 1 drop into the right eye daily.   HYDROCODONE-ACETAMINOPHEN (NORCO) 10-325 MG TABLET    Take one tablet by mouth every 8 hours as needed for pain   LORAZEPAM (ATIVAN) 1 MG TABLET    1/2 by mouth at night   NON FORMULARY    FLN eye drop 1 drop daily right eye   OMEPRAZOLE (PRILOSEC) 20 MG CAPSULE    Take one tablet daily for reflux   RANITIDINE (ZANTAC) 300 MG TABLET    Take one tablet at bedtime  for reflux   TIMOLOL MALEATE OP    Apply 2 drops to eye daily. Right eye   TURMERIC 500 MG CAPS    Take 500 mg by mouth. 1 by mouth daily   VALACYCLOVIR (VALTREX) 1000 MG TABLET    Take 1,000 mg by mouth daily.   Modified Medications   No medications on file  Discontinued Medications   No medications on file    Review of Systems  Constitutional: Positive for appetite change. Negative for chills and fever.       Decreased appetite  HENT: Positive for dental problem. Negative for congestion, ear pain and rhinorrhea.   Eyes:       History of glaucoma. History of cataracts. History of a torn retina in the right eye 03/06/2010 associated with a dense vitreous hemorrhage. She had laser therapy and cryotherapy to the right. History of herpes zoster of the right. Takes Valtrex continuously. Ptosis right eye followed shingles.  Respiratory: Negative for cough and chest tightness.   Cardiovascular: Negative for chest pain, palpitations and leg swelling.  Gastrointestinal: Negative for diarrhea.       History mild dysphagia. Food seems to hang in the chest.  Genitourinary: Negative for dysuria, enuresis, flank pain, frequency, genital sores,  hematuria and urgency.       History of ovarian cysts CT scan November 2002. Suprapubic discomfort.  Musculoskeletal: Positive for myalgias.       Chronic back pains. Pain down the right leg suggestive of sciatica. Mild arthralgias in her hips and knees. Stable balance and gait.biopsy-proven malignant melanoma of the right wrist for which she has received chemotherapy with ipilimumab. Mass has not shrunk, but is not any larger.  Skin: Negative for pallor, rash and wound.       SK right hip and other places. Cystic mass of the right wrist that has been labeled melanoma.  Neurological: Negative for headaches.  Hematological: Negative.   Psychiatric/Behavioral: Negative.  Negative for agitation, behavioral problems, confusion and sleep disturbance.    Vitals:     11/03/16 1301  BP: (!) 146/82  Pulse: 77  Temp: 98 F (36.7 C)  TempSrc: Oral  SpO2: 97%  Weight: 234 lb 6.4 oz (106.3 kg)  Height: _0  (1.626 m)   Body mass index is 40.23 kg/m. Wt Readings from Last 3 Encounters:  11/03/16 234 lb 6.4 oz (106.3 kg)  10/29/16 233 lb (105.7 kg)  05/05/16 229 lb 12.8 oz (104.2 kg)      Physical Exam  Constitutional: She is oriented to person, place, and time. She appears well-developed and well-nourished. No distress.  Moderately overweight.  HENT:  Head: Normocephalic and atraumatic.  Right Ear: External ear normal.  Left Ear: External ear normal.  Nose: Nose normal.  Mouth/Throat: Oropharynx is clear and moist.  Dentures.  Eyes: Conjunctivae and EOM are normal. Pupils are equal, round, and reactive to light.  Ptosis of the right eye.  Neck: Neck supple. No JVD present. No tracheal deviation present. No thyromegaly present.  Cardiovascular: Normal rate, normal heart sounds and intact distal pulses.  Exam reveals no gallop and no friction rub.   No murmur heard. Pulmonary/Chest: Breath sounds normal. No respiratory distress. She has no wheezes. She has no rales.  Abdominal: Bowel sounds are normal. She exhibits no distension and no mass. There is tenderness (suprapubic).  Musculoskeletal: Normal range of motion. She exhibits no edema or tenderness.  Biopsy-proven melanoma mass of the medial side of the right wrist.  Lymphadenopathy:    She has no cervical adenopathy.  Neurological: She is alert and oriented to person, place, and time. She has normal reflexes.  04/24/14 MMSE 29/30. Passed clock drawing.  Skin: No rash noted. No erythema. No pallor.  SK right hip. Lipoma of left axilla medially. Firm cystic feeling melanoma mass right wrist ulnar aspect. Firm nodule at the left wrist radially.  Psychiatric: She has a normal mood and affect. Her behavior is normal. Judgment and thought content normal.    Labs reviewed: Lab Summary  Latest Ref Rng & Units 10/29/2016 04/30/2016 03/27/2015  Hemoglobin 13.0-17.0 g/dL (None) (None) (None)  Hematocrit 39.0-52.0 % (None) (None) (None)  White count - (None) (None) (None)  Platelet count - (None) (None) (None)  Sodium 135 - 146 mmol/L 139 139 141  Potassium 3.5 - 5.3 mmol/L 4.4 4.4 4.4  Calcium 8.6 - 10.4 mg/dL 9.6 9.5 9.4  Phosphorus - (None) (None) (None)  Creatinine 0.60 - 0.93 mg/dL 0.59(L) 0.64 0.58  AST 10 - 35 U/L (None) 19 16  Alk Phos 33 - 130 U/L (None) 56 57  Bilirubin 0.2 - 1.2 mg/dL (None) 0.5 0.3  Glucose 65 - 99 mg/dL 112(H) 112(H) 103(H)  Cholesterol <200 mg/dL 236(H) (None) (None)  HDL cholesterol >  50 mg/dL 60 (None) 64  Triglycerides <150 mg/dL 149 (None) 159(H)  LDL Direct - (None) (None) (None)  LDL Calc <100 mg/dL 146(H) (None) 133(H)  Total protein 6.1 - 8.1 g/dL (None) 6.8 (None)  Albumin 3.6 - 5.1 g/dL (None) 3.9 3.9  Some recent data might be hidden   No results found for: TSH, T3TOTAL, T4TOTAL, THYROIDAB Lab Results  Component Value Date   BUN 15 10/29/2016   BUN 13 04/30/2016   BUN 11 03/27/2015   Lab Results  Component Value Date   HGBA1C 5.5 10/29/2016   HGBA1C 5.5 04/30/2016   HGBA1C 5.8 (H) 03/27/2015    Assessment/Plan  1. Malignant melanoma of right upper extremity ([place]) Continue scheduled appts at [facility]  2. Type 2 diabetes mellitus without complication, without long-term current use of insulin (HCC) controlled  3. Other hyperlipidemia Controlled adequately. Not medicated.  4. Dysuria - Urine culture - Urinalysis  5. Sciatica of right side - HYDROcodone-acetaminophen (NORCO) 10-325 MG tablet; Take one tablet by mouth every 8 hours as needed for pain  Dispense: 90 tablet; Refill: 0  6. Insomnia, unspecified type - LORazepam (ATIVAN) 1 MG tablet; 1/2 to 1  by mouth at bed if needed tfor rest  Dispense: 30 tablet; Refill: 2

## 2016-11-04 DIAGNOSIS — Z9221 Personal history of antineoplastic chemotherapy: Secondary | ICD-10-CM | POA: Diagnosis not present

## 2016-11-04 DIAGNOSIS — Z170 Estrogen receptor positive status [ER+]: Secondary | ICD-10-CM | POA: Diagnosis not present

## 2016-11-04 DIAGNOSIS — C50911 Malignant neoplasm of unspecified site of right female breast: Secondary | ICD-10-CM | POA: Diagnosis not present

## 2016-11-04 DIAGNOSIS — M818 Other osteoporosis without current pathological fracture: Secondary | ICD-10-CM | POA: Diagnosis not present

## 2016-11-04 LAB — URINALYSIS
BILIRUBIN URINE: NEGATIVE
Glucose, UA: NEGATIVE
Hgb urine dipstick: NEGATIVE
Ketones, ur: NEGATIVE
Nitrite: NEGATIVE
PH: 7.5 (ref 5.0–8.0)
Protein, ur: NEGATIVE
SPECIFIC GRAVITY, URINE: 1.009 (ref 1.001–1.035)

## 2016-11-05 ENCOUNTER — Telehealth: Payer: Self-pay

## 2016-11-05 MED ORDER — CIPROFLOXACIN HCL 250 MG PO TABS: 250.0000 mg | ORAL_TABLET | Freq: Two times a day (BID) | ORAL | 0 refills | Status: DC

## 2016-11-05 NOTE — Telephone Encounter (Signed)
Discussed results with patient, patient verbalized understanding of results, rx sent to [facility]

## 2016-11-05 NOTE — Telephone Encounter (Signed)
-----   Message from Estill Dooms, MD sent at 11/05/2016 11:48 AM EDT ----- Advise patient there is an infection. Call in prescription for Cipro 250 mg (20) One twice daily to treat infection.

## 2016-11-06 LAB — URINE CULTURE

## 2016-11-12 LAB — HM DEXA SCAN: HM DEXA SCAN: NORMAL

## 2016-11-30 DIAGNOSIS — Z79899 Other long term (current) drug therapy: Secondary | ICD-10-CM | POA: Diagnosis not present

## 2016-11-30 DIAGNOSIS — Z853 Personal history of malignant neoplasm of breast: Secondary | ICD-10-CM | POA: Diagnosis not present

## 2016-11-30 DIAGNOSIS — N950 Postmenopausal bleeding: Secondary | ICD-10-CM | POA: Diagnosis not present

## 2016-11-30 DIAGNOSIS — Z85820 Personal history of malignant melanoma of skin: Secondary | ICD-10-CM | POA: Diagnosis not present

## 2016-11-30 DIAGNOSIS — N840 Polyp of corpus uteri: Secondary | ICD-10-CM | POA: Diagnosis not present

## 2016-12-09 DIAGNOSIS — J342 Deviated nasal septum: Secondary | ICD-10-CM | POA: Diagnosis not present

## 2016-12-09 DIAGNOSIS — J328 Other chronic sinusitis: Secondary | ICD-10-CM | POA: Diagnosis not present

## 2016-12-09 DIAGNOSIS — Z888 Allergy status to other drugs, medicaments and biological substances status: Secondary | ICD-10-CM | POA: Diagnosis not present

## 2016-12-09 DIAGNOSIS — J339 Nasal polyp, unspecified: Secondary | ICD-10-CM | POA: Diagnosis not present

## 2016-12-09 DIAGNOSIS — R040 Epistaxis: Secondary | ICD-10-CM | POA: Insufficient documentation

## 2016-12-09 DIAGNOSIS — Z880 Allergy status to penicillin: Secondary | ICD-10-CM | POA: Diagnosis not present

## 2016-12-09 DIAGNOSIS — J329 Chronic sinusitis, unspecified: Secondary | ICD-10-CM | POA: Diagnosis not present

## 2017-01-11 DIAGNOSIS — Z853 Personal history of malignant neoplasm of breast: Secondary | ICD-10-CM | POA: Diagnosis not present

## 2017-01-11 DIAGNOSIS — C799 Secondary malignant neoplasm of unspecified site: Secondary | ICD-10-CM | POA: Diagnosis not present

## 2017-01-12 ENCOUNTER — Other Ambulatory Visit: Payer: Self-pay

## 2017-01-12 ENCOUNTER — Encounter: Payer: Self-pay | Admitting: Nurse Practitioner

## 2017-01-12 ENCOUNTER — Ambulatory Visit (INDEPENDENT_AMBULATORY_CARE_PROVIDER_SITE_OTHER): Payer: Medicare Other | Admitting: Nurse Practitioner

## 2017-01-12 VITALS — BP 124/72 | HR 73 | Temp 97.0°F | Resp 18 | Ht 64.0 in | Wt 231.6 lb

## 2017-01-12 DIAGNOSIS — R300 Dysuria: Secondary | ICD-10-CM | POA: Diagnosis not present

## 2017-01-12 LAB — POCT URINALYSIS DIPSTICK
BILIRUBIN UA: NEGATIVE
Blood, UA: NEGATIVE
Glucose, UA: NEGATIVE
Ketones, UA: NEGATIVE
NITRITE UA: POSITIVE
Protein, UA: NEGATIVE
Spec Grav, UA: 1.01 (ref 1.010–1.025)
Urobilinogen, UA: 0.2 E.U./dL
pH, UA: 7 (ref 5.0–8.0)

## 2017-01-12 MED ORDER — CIPROFLOXACIN HCL 500 MG PO TABS
500.0000 mg | ORAL_TABLET | Freq: Two times a day (BID) | ORAL | 0 refills | Status: DC
Start: 2017-01-12 — End: 2017-05-26

## 2017-01-12 NOTE — Progress Notes (Signed)
Careteam: Patient Care Team: Gayland Curry, DO as PCP - General (Geriatric Medicine) Harmon Dun, NP (Oncology) Magrinat, Virgie Dad, MD as Consulting Physician (Oncology) Erroll Luna, MD as Consulting Physician (General Surgery) Harvie Heck, MD as Physician Assistant (Internal Medicine) Chyrel Masson, DO as Anesthesiologist (Otolaryngology) Clinger, Chrystie Nose, MD (Otolaryngology)  Advanced Directive information Does Patient Have a Medical Advance Directive?: No, Would patient like information on creating a medical advance directive?: Yes (MAU/Ambulatory/Procedural Areas - Information given)  Allergies  Allergen Reactions  . Adhesive [Tape] Dermatitis  . Betimol [Timolol Maleate]     Allergic to preservatives   . Cosopt [Dorzolamide Hcl-Timolol Mal] Other (See Comments)    Turns eye red, increases eye pressure  . Penicillins   . Tamoxifen Other (See Comments)    Vaginal bleeding  . Thimerosal     Makes pt eye red     Chief Complaint  Patient presents with  . Acute Visit    Pt has been having burning/stinging with urination and lower abdominal pain x 4 days.      HPI: Patient is a 71 y.o. female seen in the office today due to possible UTI. Pt with hx of UTI. She has been having burning with urination and lower abdominal pain for 4 days. Has frequency at baseline. More urgency.  Has stage 4 melanoma. Lots of stress. Had MRI yesterday to evaluate Reports she has had 3 UTIs this year Was on Nitrofurantion first, then Cipro.  Having a lot of lower abdominal pain and pressure.  Taking AZO and ibuprofen due to pain when she urinates.  PCN allergy, but not sure what it was- happened as a kid  Has hx of UTIs when she was younger, went to the urologist and had her urethral stretched.   Review of Systems:  Review of Systems  Constitutional: Negative for chills, fever and weight loss.  Genitourinary: Positive for dysuria, frequency and urgency. Negative for flank  pain.  Neurological: Negative for weakness.    Past Medical History:  Diagnosis Date  . Asymptomatic varicose veins   . Cancer (HCC)    breast   . Diverticulosis   . DM type 2 (diabetes mellitus, type 2) ([place]) 10/23/2014  . Dysuria   . GERD (gastroesophageal reflux disease)   . Hiatal hernia   . Hyperlipidemia   . Malignant neoplasm of breast (female), unspecified site   . Migraine without aura, without mention of intractable migraine without mention of status migrainosus   . Other abnormal blood chemistry   . Pain in joint, pelvic region and thigh   . Shingles    in eyes  . Stricture and stenosis of esophagus   . Trigger finger (acquired)   . Unspecified cataract   . Unspecified glaucoma(365.9)    Past Surgical History:  Procedure Laterality Date  . APPENDECTOMY  1973   Dr. Linzie Collin  . BREAST LUMPECTOMY Right 08/29/2008   Dr. Erroll Luna  . ESOPHAGEAL DILATION    . NASAL POLYP EXCISION  10/2015  . POLYPECTOMY  11/20/14   Uterine Polyp Removed   Social History:   reports that she has never smoked. She has never used smokeless tobacco. She reports that she does not drink alcohol or use drugs.  Family History  Problem Relation Age of Onset  . Hypertension Mother   . Hyperlipidemia Mother   . Alzheimer's disease Mother   . Emphysema Father   . COPD Father     Medications: Patient's Medications  New Prescriptions   No medications on file  Previous Medications   ARTIFICIAL TEAR OP    Apply 1 % to eye daily. right eyes   CALCIUM CITRATE-VITAMIN D 250-200 MG-UNIT TABS    Take 250 mg by mouth 2 (two) times daily.   CHOLECALCIFEROL (VITAMIN D3) 2000 UNITS TABS    Take by mouth daily.   CRANBERRY 1000 MG CAPS    Take 1 capsule by mouth daily.   CYANOCOBALAMIN (B-12) 1000 MCG CAPS    Take by mouth daily.   FLUOROMETHOLONE (FML) 0.1 % OPHTHALMIC SUSPENSION    Place 1 drop into the right eye daily.   HYDROCODONE-ACETAMINOPHEN (NORCO) 10-325 MG TABLET    Take one tablet  by mouth every 8 hours as needed for pain   LORAZEPAM (ATIVAN) 1 MG TABLET    1/2 to 1  by mouth at bed if needed tfor rest   NON FORMULARY    FLN eye drop 1 drop daily right eye   OMEPRAZOLE (PRILOSEC) 20 MG CAPSULE    Take one tablet daily for reflux   RANITIDINE (ZANTAC) 300 MG TABLET    Take one tablet at bedtime for reflux   TIMOLOL MALEATE OP    Apply 2 drops to eye daily. Right eye   TURMERIC 500 MG CAPS    Take 500 mg by mouth. 1 by mouth daily   VALACYCLOVIR (VALTREX) 1000 MG TABLET    Take 1,000 mg by mouth daily.   Modified Medications   No medications on file  Discontinued Medications   CIPROFLOXACIN (CIPRO) 250 MG TABLET    Take 1 tablet (250 mg total) by mouth 2 (two) times daily.     Physical Exam:  Vitals:   01/12/17 1419  BP: 124/72  Pulse: 73  Resp: 18  Temp: 97 F (36.1 C)  TempSrc: Oral  SpO2: 98%  Weight: 231 lb 9.6 oz (105.1 kg)  Height: 5\' 4"  (1.626 m)   Body mass index is 39.75 kg/m.  Physical Exam  Constitutional: She appears well-developed and well-nourished.  Cardiovascular: Normal rate, regular rhythm and normal heart sounds.   Pulmonary/Chest: Effort normal and breath sounds normal.  Abdominal: Soft. Bowel sounds are normal. There is tenderness (suprapubic).  Skin: Skin is warm and dry.    Labs reviewed: Basic Metabolic Panel:  Recent Labs  04/30/16 1119 10/29/16 1041  NA 139 139  K 4.4 4.4  CL 102 102  CO2 28 31  GLUCOSE 112* 112*  BUN 13 15  CREATININE 0.64 0.59*  CALCIUM 9.5 9.6   Liver Function Tests:  Recent Labs  04/30/16 1119  AST 19  ALT 20  ALKPHOS 56  BILITOT 0.5  PROT 6.8  ALBUMIN 3.9   No results for input(s): LIPASE, AMYLASE in the last 8760 hours. No results for input(s): AMMONIA in the last 8760 hours. CBC: No results for input(s): WBC, NEUTROABS, HGB, HCT, MCV, PLT in the last 8760 hours. Lipid Panel:  Recent Labs  10/29/16 1049  CHOL 236*  HDL 60  LDLCALC 146*  TRIG 149  CHOLHDL 3.9    TSH: No results for input(s): TSH in the last 8760 hours. A1C: Lab Results  Component Value Date   HGBA1C 5.5 10/29/2016     Assessment/Plan 1. Dysuria - POCT urinalysis dipstick -abnormal with leukocytes and nitrates  - Culture, Urine; Future - ciprofloxacin (CIPRO) 500 MG tablet; Take 1 tablet (500 mg total) by mouth 2 (two) times daily.  Dispense: 20 tablet; Refill: 0 -  encouraged hydration.   Carlos American. Harle Battiest  [facility] & Adult Medicine [phone] 8 am - 5 pm) [phone] (after hours)

## 2017-01-15 LAB — URINE CULTURE

## 2017-01-18 ENCOUNTER — Encounter: Payer: Self-pay | Admitting: Nurse Practitioner

## 2017-01-18 ENCOUNTER — Ambulatory Visit (INDEPENDENT_AMBULATORY_CARE_PROVIDER_SITE_OTHER): Payer: Medicare Other | Admitting: Nurse Practitioner

## 2017-01-18 VITALS — BP 132/74 | HR 75 | Temp 98.6°F | Resp 18 | Ht 64.0 in | Wt 230.8 lb

## 2017-01-18 DIAGNOSIS — R300 Dysuria: Secondary | ICD-10-CM | POA: Diagnosis not present

## 2017-01-18 DIAGNOSIS — N390 Urinary tract infection, site not specified: Secondary | ICD-10-CM

## 2017-01-18 NOTE — Progress Notes (Signed)
Careteam: Patient Care Team: Gayland Curry, DO as PCP - General (Geriatric Medicine) Harmon Dun, NP (Oncology) Magrinat, Virgie Dad, MD as Consulting Physician (Oncology) Erroll Luna, MD as Consulting Physician (General Surgery) Harvie Heck, MD as Physician Assistant (Internal Medicine) Chyrel Masson, DO as Anesthesiologist (Otolaryngology) Wendie Chess, Chrystie Nose, MD (Otolaryngology)  Advanced Directive information Does Patient Have a Medical Advance Directive?: No  Allergies  Allergen Reactions  . Adhesive [Tape] Dermatitis  . Betimol [Timolol Maleate]     Allergic to preservatives   . Cosopt [Dorzolamide Hcl-Timolol Mal] Other (See Comments)    Turns eye red, increases eye pressure  . Penicillins   . Tamoxifen Other (See Comments)    Vaginal bleeding  . Thimerosal     Makes pt eye red     Chief Complaint  Patient presents with  . Acute Visit    Pt is being seen due continued uninary symptoms when appt scheduled. Pt now reports that symptoms have eased. No burning, stomach pressure, but she does have some pain in right hip area.      HPI: Patient is a 71 y.o. female seen in the office today to follow up UTI. Pt reports she has taken Cipro for 6 days. Yesterday was still having symptoms but today she is better. No pressure, pain, frequency.  Having trouble with flow. Reports urine will start and stop which has been an issue.  Feels like she is having a hard time completely emptying bladder. Does not wish to have pelvic exam today.  3rd UTI this year however there was 10-50,000 colonies on culture.  Had similar symptoms when she was younger, went away for a long time and then in the last 3 years have been having more trouble with dysuria, frequency and urgency.  Recently saw GYN- had pelvic done- thinks it may have been around April/May of this year- no abnormalities noted.  Review of Systems:  Review of Systems  Constitutional: Negative for chills, fever and  malaise/fatigue.  Gastrointestinal: Negative for constipation and diarrhea.  Genitourinary: Positive for dysuria, frequency and urgency. Negative for flank pain and hematuria.    Past Medical History:  Diagnosis Date  . Asymptomatic varicose veins   . Cancer (HCC)    breast   . Diverticulosis   . DM type 2 (diabetes mellitus, type 2) ([place]) 10/23/2014  . Dysuria   . GERD (gastroesophageal reflux disease)   . Hiatal hernia   . Hyperlipidemia   . Malignant neoplasm of breast (female), unspecified site   . Migraine without aura, without mention of intractable migraine without mention of status migrainosus   . Other abnormal blood chemistry   . Pain in joint, pelvic region and thigh   . Shingles    in eyes  . Stricture and stenosis of esophagus   . Trigger finger (acquired)   . Unspecified cataract   . Unspecified glaucoma(365.9)    Past Surgical History:  Procedure Laterality Date  . APPENDECTOMY  1973   Dr. Linzie Collin  . BREAST LUMPECTOMY Right 08/29/2008   Dr. Erroll Luna  . ESOPHAGEAL DILATION    . NASAL POLYP EXCISION  10/2015  . POLYPECTOMY  11/20/14   Uterine Polyp Removed   Social History:   reports that she has never smoked. She has never used smokeless tobacco. She reports that she does not drink alcohol or use drugs.  Family History  Problem Relation Age of Onset  . Hypertension Mother   . Hyperlipidemia Mother   .  Alzheimer's disease Mother   . Emphysema Father   . COPD Father     Medications: Patient's Medications  New Prescriptions   No medications on file  Previous Medications   ARTIFICIAL TEAR OP    Apply 1 % to eye daily. right eyes   CALCIUM CITRATE-VITAMIN D 250-200 MG-UNIT TABS    Take 250 mg by mouth 2 (two) times daily.   CHOLECALCIFEROL (VITAMIN D3) 2000 UNITS TABS    Take by mouth daily.   CIPROFLOXACIN (CIPRO) 500 MG TABLET    Take 1 tablet (500 mg total) by mouth 2 (two) times daily.   CRANBERRY 1000 MG CAPS    Take 1 capsule by mouth  daily.   CYANOCOBALAMIN (B-12) 1000 MCG CAPS    Take by mouth daily.   FLUOROMETHOLONE (FML) 0.1 % OPHTHALMIC SUSPENSION    Place 1 drop into the right eye daily.   HYDROCODONE-ACETAMINOPHEN (NORCO) 10-325 MG TABLET    Take one tablet by mouth every 8 hours as needed for pain   LORAZEPAM (ATIVAN) 1 MG TABLET    1/2 to 1  by mouth at bed if needed tfor rest   NON FORMULARY    FLN eye drop 1 drop daily right eye   OMEPRAZOLE (PRILOSEC) 20 MG CAPSULE    Take one tablet daily for reflux   RANITIDINE (ZANTAC) 300 MG TABLET    Take one tablet at bedtime for reflux   TIMOLOL MALEATE OP    Apply 2 drops to eye daily. Right eye   TURMERIC 500 MG CAPS    Take 500 mg by mouth. 1 by mouth daily   VALACYCLOVIR (VALTREX) 1000 MG TABLET    Take 1,000 mg by mouth daily.   Modified Medications   No medications on file  Discontinued Medications   No medications on file     Physical Exam:  Vitals:   01/18/17 1308  BP: 132/74  Pulse: 75  Resp: 18  Temp: 98.6 F (37 C)  TempSrc: Oral  SpO2: 97%  Weight: 230 lb 12.8 oz (104.7 kg)  Height: 5\' 4"  (1.626 m)   Body mass index is 39.62 kg/m.  Physical Exam  Constitutional: She appears well-developed and well-nourished.  Cardiovascular: Normal rate, regular rhythm and normal heart sounds.   Pulmonary/Chest: Effort normal and breath sounds normal.  Abdominal: Soft. Bowel sounds are normal. She exhibits no distension. There is no tenderness.  Skin: Skin is warm and dry.  Psychiatric: She has a normal mood and affect. Her behavior is normal.    Labs reviewed: Basic Metabolic Panel:  Recent Labs  04/30/16 1119 10/29/16 1041  NA 139 139  K 4.4 4.4  CL 102 102  CO2 28 31  GLUCOSE 112* 112*  BUN 13 15  CREATININE 0.64 0.59*  CALCIUM 9.5 9.6   Liver Function Tests:  Recent Labs  04/30/16 1119  AST 19  ALT 20  ALKPHOS 56  BILITOT 0.5  PROT 6.8  ALBUMIN 3.9   No results for input(s): LIPASE, AMYLASE in the last 8760 hours. No  results for input(s): AMMONIA in the last 8760 hours. CBC: No results for input(s): WBC, NEUTROABS, HGB, HCT, MCV, PLT in the last 8760 hours. Lipid Panel:  Recent Labs  10/29/16 1049  CHOL 236*  HDL 60  LDLCALC 146*  TRIG 149  CHOLHDL 3.9   TSH: No results for input(s): TSH in the last 8760 hours. A1C: Lab Results  Component Value Date   HGBA1C 5.5 10/29/2016  Assessment/Plan 1. Dysuria - off and on, conts on cipro -pt with other urinary complaints (reduced flow, questions if she is completely emptying bladder) will get Ambulatory referral to Urology   2. Recurrent UTI -to complete current course of Cipro. Symptoms improved today after 6 days of antibiotics.  - Ambulatory referral to Urology for further evaluation and treatment   Finnian Husted K. Harle Battiest  [facility] & Adult Medicine [phone] 8 am - 5 pm) [phone] (after hours)

## 2017-01-18 NOTE — Patient Instructions (Addendum)
Complete antibiotic  Urology referral placed

## 2017-02-03 DIAGNOSIS — Z9889 Other specified postprocedural states: Secondary | ICD-10-CM | POA: Diagnosis not present

## 2017-02-03 DIAGNOSIS — H2512 Age-related nuclear cataract, left eye: Secondary | ICD-10-CM | POA: Diagnosis not present

## 2017-02-03 DIAGNOSIS — H02401 Unspecified ptosis of right eyelid: Secondary | ICD-10-CM | POA: Diagnosis not present

## 2017-02-03 DIAGNOSIS — Z880 Allergy status to penicillin: Secondary | ICD-10-CM | POA: Diagnosis not present

## 2017-02-03 DIAGNOSIS — Z91041 Radiographic dye allergy status: Secondary | ICD-10-CM | POA: Diagnosis not present

## 2017-02-03 DIAGNOSIS — B0051 Herpesviral iridocyclitis: Secondary | ICD-10-CM | POA: Diagnosis not present

## 2017-02-03 DIAGNOSIS — D3131 Benign neoplasm of right choroid: Secondary | ICD-10-CM | POA: Diagnosis not present

## 2017-02-03 DIAGNOSIS — Z9841 Cataract extraction status, right eye: Secondary | ICD-10-CM | POA: Diagnosis not present

## 2017-02-03 DIAGNOSIS — H4041X4 Glaucoma secondary to eye inflammation, right eye, indeterminate stage: Secondary | ICD-10-CM | POA: Diagnosis not present

## 2017-02-03 DIAGNOSIS — H209 Unspecified iridocyclitis: Secondary | ICD-10-CM | POA: Diagnosis not present

## 2017-02-03 DIAGNOSIS — Z888 Allergy status to other drugs, medicaments and biological substances status: Secondary | ICD-10-CM | POA: Diagnosis not present

## 2017-02-03 DIAGNOSIS — Z961 Presence of intraocular lens: Secondary | ICD-10-CM | POA: Diagnosis not present

## 2017-02-22 DIAGNOSIS — C4361 Malignant melanoma of right upper limb, including shoulder: Secondary | ICD-10-CM | POA: Diagnosis not present

## 2017-02-22 DIAGNOSIS — K1379 Other lesions of oral mucosa: Secondary | ICD-10-CM | POA: Diagnosis not present

## 2017-02-22 DIAGNOSIS — R1032 Left lower quadrant pain: Secondary | ICD-10-CM | POA: Diagnosis not present

## 2017-02-22 DIAGNOSIS — C799 Secondary malignant neoplasm of unspecified site: Secondary | ICD-10-CM | POA: Diagnosis not present

## 2017-02-22 DIAGNOSIS — R229 Localized swelling, mass and lump, unspecified: Secondary | ICD-10-CM | POA: Diagnosis not present

## 2017-03-04 DIAGNOSIS — M25552 Pain in left hip: Secondary | ICD-10-CM | POA: Diagnosis not present

## 2017-03-04 DIAGNOSIS — Z853 Personal history of malignant neoplasm of breast: Secondary | ICD-10-CM | POA: Diagnosis not present

## 2017-03-04 DIAGNOSIS — Z170 Estrogen receptor positive status [ER+]: Secondary | ICD-10-CM | POA: Diagnosis not present

## 2017-03-04 DIAGNOSIS — C50911 Malignant neoplasm of unspecified site of right female breast: Secondary | ICD-10-CM | POA: Diagnosis not present

## 2017-03-04 DIAGNOSIS — M79605 Pain in left leg: Secondary | ICD-10-CM | POA: Diagnosis not present

## 2017-03-04 DIAGNOSIS — N6320 Unspecified lump in the left breast, unspecified quadrant: Secondary | ICD-10-CM | POA: Diagnosis not present

## 2017-03-04 DIAGNOSIS — N6321 Unspecified lump in the left breast, upper outer quadrant: Secondary | ICD-10-CM | POA: Diagnosis not present

## 2017-03-04 DIAGNOSIS — C4361 Malignant melanoma of right upper limb, including shoulder: Secondary | ICD-10-CM | POA: Diagnosis not present

## 2017-03-04 DIAGNOSIS — Z08 Encounter for follow-up examination after completed treatment for malignant neoplasm: Secondary | ICD-10-CM | POA: Diagnosis not present

## 2017-03-07 DIAGNOSIS — C4361 Malignant melanoma of right upper limb, including shoulder: Secondary | ICD-10-CM | POA: Diagnosis not present

## 2017-03-07 DIAGNOSIS — Z853 Personal history of malignant neoplasm of breast: Secondary | ICD-10-CM | POA: Diagnosis not present

## 2017-03-08 DIAGNOSIS — C4361 Malignant melanoma of right upper limb, including shoulder: Secondary | ICD-10-CM | POA: Diagnosis not present

## 2017-03-08 DIAGNOSIS — M25562 Pain in left knee: Secondary | ICD-10-CM | POA: Diagnosis not present

## 2017-03-08 DIAGNOSIS — C792 Secondary malignant neoplasm of skin: Secondary | ICD-10-CM | POA: Diagnosis not present

## 2017-03-08 DIAGNOSIS — Z8709 Personal history of other diseases of the respiratory system: Secondary | ICD-10-CM | POA: Diagnosis not present

## 2017-03-08 DIAGNOSIS — G4700 Insomnia, unspecified: Secondary | ICD-10-CM | POA: Diagnosis not present

## 2017-03-08 DIAGNOSIS — M7989 Other specified soft tissue disorders: Secondary | ICD-10-CM | POA: Diagnosis not present

## 2017-03-08 DIAGNOSIS — R1032 Left lower quadrant pain: Secondary | ICD-10-CM | POA: Diagnosis not present

## 2017-03-08 DIAGNOSIS — M545 Low back pain: Secondary | ICD-10-CM | POA: Diagnosis not present

## 2017-03-09 DIAGNOSIS — Z87440 Personal history of urinary (tract) infections: Secondary | ICD-10-CM | POA: Diagnosis not present

## 2017-03-09 DIAGNOSIS — N952 Postmenopausal atrophic vaginitis: Secondary | ICD-10-CM | POA: Diagnosis not present

## 2017-03-10 ENCOUNTER — Ambulatory Visit: Payer: Medicare Other | Admitting: Internal Medicine

## 2017-03-11 ENCOUNTER — Other Ambulatory Visit: Payer: Self-pay | Admitting: *Deleted

## 2017-03-11 DIAGNOSIS — M5431 Sciatica, right side: Secondary | ICD-10-CM

## 2017-03-11 MED ORDER — HYDROCODONE-ACETAMINOPHEN 10-325 MG PO TABS: ORAL_TABLET | ORAL | 0 refills | Status: DC

## 2017-03-11 NOTE — Telephone Encounter (Signed)
Patient requested and will pick up [place] Database checked.  

## 2017-03-15 DIAGNOSIS — M25562 Pain in left knee: Secondary | ICD-10-CM | POA: Diagnosis not present

## 2017-03-15 DIAGNOSIS — M25552 Pain in left hip: Secondary | ICD-10-CM | POA: Diagnosis not present

## 2017-03-15 DIAGNOSIS — M160 Bilateral primary osteoarthritis of hip: Secondary | ICD-10-CM | POA: Diagnosis not present

## 2017-03-15 DIAGNOSIS — M7062 Trochanteric bursitis, left hip: Secondary | ICD-10-CM | POA: Diagnosis not present

## 2017-04-13 DIAGNOSIS — M5442 Lumbago with sciatica, left side: Secondary | ICD-10-CM | POA: Diagnosis not present

## 2017-04-13 DIAGNOSIS — M545 Low back pain: Secondary | ICD-10-CM | POA: Diagnosis not present

## 2017-04-13 DIAGNOSIS — C4361 Malignant melanoma of right upper limb, including shoulder: Secondary | ICD-10-CM | POA: Diagnosis not present

## 2017-04-15 ENCOUNTER — Other Ambulatory Visit: Payer: Self-pay | Admitting: *Deleted

## 2017-04-15 ENCOUNTER — Other Ambulatory Visit: Payer: Self-pay | Admitting: Internal Medicine

## 2017-04-15 ENCOUNTER — Other Ambulatory Visit: Payer: Medicare Other

## 2017-04-15 ENCOUNTER — Encounter: Payer: Self-pay | Admitting: *Deleted

## 2017-04-15 DIAGNOSIS — Z79899 Other long term (current) drug therapy: Secondary | ICD-10-CM | POA: Diagnosis not present

## 2017-04-15 DIAGNOSIS — M5431 Sciatica, right side: Secondary | ICD-10-CM

## 2017-04-15 MED ORDER — HYDROCODONE-ACETAMINOPHEN 10-325 MG PO TABS: ORAL_TABLET | ORAL | 0 refills | Status: DC

## 2017-04-15 NOTE — Telephone Encounter (Signed)
Patient Requested and will pick up [facility].  Drug Screen Ordered and Narcotic Contract updated.

## 2017-04-20 DIAGNOSIS — M4727 Other spondylosis with radiculopathy, lumbosacral region: Secondary | ICD-10-CM | POA: Diagnosis not present

## 2017-04-20 DIAGNOSIS — M5116 Intervertebral disc disorders with radiculopathy, lumbar region: Secondary | ICD-10-CM | POA: Diagnosis not present

## 2017-04-20 DIAGNOSIS — M5117 Intervertebral disc disorders with radiculopathy, lumbosacral region: Secondary | ICD-10-CM | POA: Diagnosis not present

## 2017-04-20 DIAGNOSIS — M4726 Other spondylosis with radiculopathy, lumbar region: Secondary | ICD-10-CM | POA: Diagnosis not present

## 2017-04-20 LAB — DRUG TOX MONITOR 1 W/CONF, ORAL FLD
Amphetamines: NEGATIVE ng/mL (ref <10)
Barbiturates: NEGATIVE ng/mL (ref <10)
Benzodiazepines: NEGATIVE ng/mL (ref <0.50)
Buprenorphine: NEGATIVE ng/mL (ref <0.025)
Cocaine: NEGATIVE ng/mL (ref <2.5)
Codeine: NEGATIVE ng/mL (ref <2.5)
Dihydrocodeine: 7.9 ng/mL — ABNORMAL HIGH (ref <2.5)
Fentanyl: NEGATIVE ng/mL (ref <0.10)
Heroin Metabolite: NEGATIVE ng/mL (ref <1.0)
Hydrocodone: 97.9 ng/mL — ABNORMAL HIGH (ref <2.5)
Hydromorphone: NEGATIVE ng/mL (ref <2.5)
MARIJUANA: NEGATIVE ng/mL (ref <2.5)
MDMA: NEGATIVE ng/mL (ref <10)
Meperidine: NEGATIVE ng/mL (ref <5.0)
Meprobamate: NEGATIVE ng/mL (ref <2.5)
Methadone: NEGATIVE ng/mL (ref <5.0)
Morphine: NEGATIVE ng/mL (ref <2.5)
Nicotine Metabolite: NEGATIVE ng/mL (ref <5.0)
Norhydrocodone: 4.5 ng/mL — ABNORMAL HIGH (ref <2.5)
Noroxycodone: NEGATIVE ng/mL (ref <2.5)
Opiates: POSITIVE ng/mL — AB (ref <2.5)
Oxycodone: NEGATIVE ng/mL (ref <2.5)
Oxymorphone: NEGATIVE ng/mL (ref <2.5)
Phencyclidine: NEGATIVE ng/mL (ref <10)
Propoxyphene: NEGATIVE ng/mL (ref <5.0)
Tapentadol: NEGATIVE ng/mL (ref <5.0)
Tramadol: NEGATIVE ng/mL (ref <5.0)
Zolpidem: NEGATIVE ng/mL (ref <5.0)

## 2017-04-27 DIAGNOSIS — M48061 Spinal stenosis, lumbar region without neurogenic claudication: Secondary | ICD-10-CM | POA: Diagnosis not present

## 2017-05-09 DIAGNOSIS — H25812 Combined forms of age-related cataract, left eye: Secondary | ICD-10-CM | POA: Diagnosis not present

## 2017-05-09 DIAGNOSIS — H4041X4 Glaucoma secondary to eye inflammation, right eye, indeterminate stage: Secondary | ICD-10-CM | POA: Diagnosis not present

## 2017-05-09 DIAGNOSIS — D3131 Benign neoplasm of right choroid: Secondary | ICD-10-CM | POA: Diagnosis not present

## 2017-05-09 DIAGNOSIS — B0051 Herpesviral iridocyclitis: Secondary | ICD-10-CM | POA: Diagnosis not present

## 2017-05-09 DIAGNOSIS — Z961 Presence of intraocular lens: Secondary | ICD-10-CM | POA: Diagnosis not present

## 2017-05-11 DIAGNOSIS — M48061 Spinal stenosis, lumbar region without neurogenic claudication: Secondary | ICD-10-CM | POA: Diagnosis not present

## 2017-05-26 ENCOUNTER — Encounter: Payer: Self-pay | Admitting: Internal Medicine

## 2017-05-26 ENCOUNTER — Ambulatory Visit (INDEPENDENT_AMBULATORY_CARE_PROVIDER_SITE_OTHER): Payer: Medicare Other | Admitting: Internal Medicine

## 2017-05-26 VITALS — BP 140/80 | HR 81 | Temp 98.5°F | Wt 229.0 lb

## 2017-05-26 DIAGNOSIS — Z6839 Body mass index (BMI) 39.0-39.9, adult: Secondary | ICD-10-CM | POA: Diagnosis not present

## 2017-05-26 DIAGNOSIS — R309 Painful micturition, unspecified: Secondary | ICD-10-CM | POA: Diagnosis not present

## 2017-05-26 DIAGNOSIS — C4361 Malignant melanoma of right upper limb, including shoulder: Secondary | ICD-10-CM | POA: Diagnosis not present

## 2017-05-26 DIAGNOSIS — E6609 Other obesity due to excess calories: Secondary | ICD-10-CM

## 2017-05-26 DIAGNOSIS — R7303 Prediabetes: Secondary | ICD-10-CM | POA: Diagnosis not present

## 2017-05-26 DIAGNOSIS — M5431 Sciatica, right side: Secondary | ICD-10-CM

## 2017-05-26 DIAGNOSIS — M353 Polymyalgia rheumatica: Secondary | ICD-10-CM | POA: Diagnosis not present

## 2017-05-26 DIAGNOSIS — M25512 Pain in left shoulder: Secondary | ICD-10-CM

## 2017-05-26 DIAGNOSIS — M25511 Pain in right shoulder: Secondary | ICD-10-CM | POA: Diagnosis not present

## 2017-05-26 DIAGNOSIS — M48062 Spinal stenosis, lumbar region with neurogenic claudication: Secondary | ICD-10-CM | POA: Diagnosis not present

## 2017-05-26 LAB — POCT URINALYSIS DIPSTICK
Bilirubin, UA: NEGATIVE
Blood, UA: NEGATIVE
Glucose, UA: NEGATIVE
Ketones, UA: NEGATIVE
Leukocytes, UA: NEGATIVE
Nitrite, UA: NEGATIVE
Protein, UA: NEGATIVE
Spec Grav, UA: <=1.005 — AB (ref 1.010–1.025)
Urobilinogen, UA: NEGATIVE E.U./dL — AB
pH, UA: 5 (ref 5.0–8.0)

## 2017-05-26 MED ORDER — HYDROCODONE-ACETAMINOPHEN 10-325 MG PO TABS: ORAL_TABLET | ORAL | 0 refills | Status: DC

## 2017-05-26 MED ORDER — GABAPENTIN 100 MG PO CAPS: 100.0000 mg | ORAL_CAPSULE | Freq: Every day | ORAL | 3 refills | Status: DC

## 2017-05-26 NOTE — Patient Instructions (Signed)
Call back to let me know how things are going with gabapentin.  We can increase it to up to three times a day if needed.    Try not to use hydrocodone or lorazepam at first.
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
## 2017-05-27 ENCOUNTER — Telehealth: Payer: Self-pay

## 2017-05-27 LAB — BASIC METABOLIC PANEL
BUN/Creatinine Ratio: 15 (calc) (ref 6–22)
BUN: 8 mg/dL (ref 7–25)
CO2: 29 mmol/L (ref 20–32)
Calcium: 10.3 mg/dL (ref 8.6–10.4)
Chloride: 98 mmol/L (ref 98–110)
Creat: 0.55 mg/dL — ABNORMAL LOW (ref 0.60–0.93)
Glucose, Bld: 97 mg/dL (ref 65–99)
Potassium: 4.2 mmol/L (ref 3.5–5.3)
Sodium: 136 mmol/L (ref 135–146)

## 2017-05-27 LAB — CBC WITH DIFFERENTIAL/PLATELET
Basophils Absolute: 42 cells/uL (ref 0–200)
Basophils Relative: 0.6 %
Eosinophils Absolute: 161 cells/uL (ref 15–500)
Eosinophils Relative: 2.3 %
HCT: 36.7 % (ref 35.0–45.0)
Hemoglobin: 12.7 g/dL (ref 11.7–15.5)
Lymphs Abs: 1477 cells/uL (ref 850–3900)
MCH: 32.2 pg (ref 27.0–33.0)
MCHC: 34.6 g/dL (ref 32.0–36.0)
MCV: 92.9 fL (ref 80.0–100.0)
MPV: 9.9 fL (ref 7.5–12.5)
Monocytes Relative: 8.3 %
Neutro Abs: 4739 cells/uL (ref 1500–7800)
Neutrophils Relative %: 67.7 %
Platelets: 311 10*3/uL (ref 140–400)
RBC: 3.95 10*6/uL (ref 3.80–5.10)
RDW: 11.9 % (ref 11.0–15.0)
Total Lymphocyte: 21.1 %
WBC mixed population: 581 cells/uL (ref 200–950)
WBC: 7 10*3/uL (ref 3.8–10.8)

## 2017-05-27 LAB — HEMOGLOBIN A1C
Hgb A1c MFr Bld: 5.8 % of total Hgb — ABNORMAL HIGH (ref <5.7)
Mean Plasma Glucose: 120 (calc)
eAG (mmol/L): 6.6 (calc)

## 2017-05-27 LAB — SEDIMENTATION RATE: Sed Rate: 63 mm/h — ABNORMAL HIGH (ref 0–30)

## 2017-05-27 MED ORDER — PREDNISONE 5 MG PO TABS: 15.0000 mg | ORAL_TABLET | Freq: Every day | ORAL | 3 refills | Status: DC
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
## 2017-05-27 NOTE — Telephone Encounter (Signed)
Patient called and stated that she heard from [facility] and would like to discuss conversation with Dr.Reed. Patient did not wish to give any further details.  Patient is requesting to speak directly with Dr.Reed (refer to labs done on 05/26/17)

## 2017-06-07 ENCOUNTER — Other Ambulatory Visit: Payer: Self-pay | Admitting: Internal Medicine

## 2017-06-07 NOTE — Telephone Encounter (Signed)
[place] Database verified and compliance confirmed   

## 2017-06-21 DIAGNOSIS — N952 Postmenopausal atrophic vaginitis: Secondary | ICD-10-CM | POA: Diagnosis not present

## 2017-06-21 DIAGNOSIS — Z87440 Personal history of urinary (tract) infections: Secondary | ICD-10-CM | POA: Diagnosis not present

## 2017-06-21 DIAGNOSIS — R300 Dysuria: Secondary | ICD-10-CM | POA: Diagnosis not present

## 2017-06-29 ENCOUNTER — Other Ambulatory Visit: Payer: Self-pay | Admitting: *Deleted

## 2017-06-29 DIAGNOSIS — M5431 Sciatica, right side: Secondary | ICD-10-CM

## 2017-06-29 MED ORDER — HYDROCODONE-ACETAMINOPHEN 10-325 MG PO TABS: ORAL_TABLET | ORAL | 0 refills | Status: DC

## 2017-06-29 NOTE — Telephone Encounter (Signed)
Patient Requested and will pick up [name].

## 2017-08-02 ENCOUNTER — Other Ambulatory Visit: Payer: Self-pay | Admitting: *Deleted

## 2017-08-02 DIAGNOSIS — M5431 Sciatica, right side: Secondary | ICD-10-CM

## 2017-08-02 MED ORDER — HYDROCODONE-ACETAMINOPHEN 10-325 MG PO TABS: ORAL_TABLET | ORAL | 0 refills | Status: DC

## 2017-08-02 NOTE — Telephone Encounter (Signed)
Patient called requesting refill on her Hydrocodone.  [place] Confirmed Pended Rx and sent to Dr. Mariea Clonts for approval.

## 2017-08-10 DIAGNOSIS — D1801 Hemangioma of skin and subcutaneous tissue: Secondary | ICD-10-CM | POA: Diagnosis not present

## 2017-08-10 DIAGNOSIS — M25511 Pain in right shoulder: Secondary | ICD-10-CM

## 2017-08-10 DIAGNOSIS — G8929 Other chronic pain: Secondary | ICD-10-CM | POA: Insufficient documentation

## 2017-08-10 DIAGNOSIS — M25512 Pain in left shoulder: Secondary | ICD-10-CM | POA: Diagnosis not present

## 2017-08-10 DIAGNOSIS — L570 Actinic keratosis: Secondary | ICD-10-CM | POA: Diagnosis not present

## 2017-08-10 DIAGNOSIS — C4361 Malignant melanoma of right upper limb, including shoulder: Secondary | ICD-10-CM | POA: Diagnosis not present

## 2017-08-10 DIAGNOSIS — I781 Nevus, non-neoplastic: Secondary | ICD-10-CM | POA: Diagnosis not present

## 2017-08-10 DIAGNOSIS — L821 Other seborrheic keratosis: Secondary | ICD-10-CM | POA: Diagnosis not present

## 2017-08-30 DIAGNOSIS — C4361 Malignant melanoma of right upper limb, including shoulder: Secondary | ICD-10-CM | POA: Diagnosis not present

## 2017-09-03 ENCOUNTER — Other Ambulatory Visit: Payer: Self-pay | Admitting: Internal Medicine

## 2017-09-05 NOTE — Telephone Encounter (Signed)
Last filled 06/07/17, per the database, ok to fill?

## 2017-09-07 ENCOUNTER — Encounter: Payer: Self-pay | Admitting: Internal Medicine

## 2017-09-07 DIAGNOSIS — M353 Polymyalgia rheumatica: Secondary | ICD-10-CM

## 2017-09-07 NOTE — Telephone Encounter (Signed)
RX was not called in yesterday as indicated in [place], I confirmed with Karena Addison (Dr.Reed's assistant).   Both rx's called in today

## 2017-09-08 DIAGNOSIS — H04123 Dry eye syndrome of bilateral lacrimal glands: Secondary | ICD-10-CM | POA: Diagnosis not present

## 2017-09-08 DIAGNOSIS — B0051 Herpesviral iridocyclitis: Secondary | ICD-10-CM | POA: Diagnosis not present

## 2017-09-08 DIAGNOSIS — H02401 Unspecified ptosis of right eyelid: Secondary | ICD-10-CM | POA: Diagnosis not present

## 2017-09-08 DIAGNOSIS — Z961 Presence of intraocular lens: Secondary | ICD-10-CM | POA: Diagnosis not present

## 2017-09-08 DIAGNOSIS — H269 Unspecified cataract: Secondary | ICD-10-CM | POA: Diagnosis not present

## 2017-09-08 DIAGNOSIS — H209 Unspecified iridocyclitis: Secondary | ICD-10-CM | POA: Diagnosis not present

## 2017-09-08 DIAGNOSIS — Z79899 Other long term (current) drug therapy: Secondary | ICD-10-CM | POA: Diagnosis not present

## 2017-09-08 DIAGNOSIS — D3131 Benign neoplasm of right choroid: Secondary | ICD-10-CM | POA: Diagnosis not present

## 2017-09-08 DIAGNOSIS — H4041X2 Glaucoma secondary to eye inflammation, right eye, moderate stage: Secondary | ICD-10-CM | POA: Diagnosis not present

## 2017-09-08 DIAGNOSIS — Z9889 Other specified postprocedural states: Secondary | ICD-10-CM | POA: Diagnosis not present

## 2017-09-12 DIAGNOSIS — C50911 Malignant neoplasm of unspecified site of right female breast: Secondary | ICD-10-CM | POA: Diagnosis not present

## 2017-09-12 DIAGNOSIS — C4361 Malignant melanoma of right upper limb, including shoulder: Secondary | ICD-10-CM | POA: Diagnosis not present

## 2017-09-20 DIAGNOSIS — Z87440 Personal history of urinary (tract) infections: Secondary | ICD-10-CM | POA: Diagnosis not present

## 2017-09-22 ENCOUNTER — Other Ambulatory Visit: Payer: Self-pay | Admitting: *Deleted

## 2017-09-22 DIAGNOSIS — M5431 Sciatica, right side: Secondary | ICD-10-CM

## 2017-09-22 MED ORDER — HYDROCODONE-ACETAMINOPHEN 10-325 MG PO TABS: ORAL_TABLET | ORAL | 0 refills | Status: DC

## 2017-09-22 NOTE — Telephone Encounter (Signed)
Patient requested [facility] Verified Pharmacy Confirmed Pended Rx and sent to Dr. Reed for approval.  

## 2017-09-26 ENCOUNTER — Ambulatory Visit (INDEPENDENT_AMBULATORY_CARE_PROVIDER_SITE_OTHER): Payer: Medicare Other | Admitting: Internal Medicine

## 2017-09-26 ENCOUNTER — Encounter: Payer: Self-pay | Admitting: Internal Medicine

## 2017-09-26 VITALS — BP 138/70 | HR 85 | Temp 98.0°F | Wt 238.0 lb

## 2017-09-26 DIAGNOSIS — Z6841 Body Mass Index (BMI) 40.0 and over, adult: Secondary | ICD-10-CM

## 2017-09-26 DIAGNOSIS — C4361 Malignant melanoma of right upper limb, including shoulder: Secondary | ICD-10-CM | POA: Diagnosis not present

## 2017-09-26 DIAGNOSIS — M353 Polymyalgia rheumatica: Secondary | ICD-10-CM | POA: Diagnosis not present

## 2017-09-26 DIAGNOSIS — M48062 Spinal stenosis, lumbar region with neurogenic claudication: Secondary | ICD-10-CM

## 2017-09-26 DIAGNOSIS — E119 Type 2 diabetes mellitus without complications: Secondary | ICD-10-CM | POA: Diagnosis not present

## 2017-09-26 LAB — SEDIMENTATION RATE: Sed Rate: 28 mm/h (ref 0–30)

## 2017-09-26 MED ORDER — CYCLOBENZAPRINE HCL 10 MG PO TABS: 10.0000 mg | ORAL_TABLET | Freq: Two times a day (BID) | ORAL | 0 refills | Status: DC

## 2017-09-26 NOTE — Patient Instructions (Signed)
Be sure to take your meloxicam with food as you have done for prednisone.

## 2017-09-26 NOTE — Progress Notes (Signed)
Location:  [facility] clinic Provider:  Jimeka Balan L. Mariea Clonts, D.O., C.M.D.  Code Status: FULL CODE Goals of Care:  Advanced Directives 09/26/2017  Does Patient Have a Medical Advance Directive? No  Would patient like information on creating a medical advance directive? No - Patient declined   Chief Complaint  Patient presents with  . Medical Management of Chronic Issues    59mh follow-up  . ACP    HPI: Patient is a 72y.o. female seen today for medical management of chronic diseases.    Hip pain did start to get better when she began the prednisone.  She doesn't see rheum until May 15th.  Bad weather interfered with previously scheduled appt.  Also took gabapentin, but she felt like her hair was crawling.  She took the first one which was bad and then even worse after the second one.  Had the feeling that she had to pee which was relieved with it.  She took it in the afternoon not night not realizing my directions said at night.  Never did try it at bedtime.  Prednisone also helped the feeling of having to pee.  Hip is still bad, but nothing like it was.  Before her visit here, she'd seen the spine center and she'd had an epidural and shot in hip.  Muscle relaxers then were not doing a bit of good.  Wonders if she had piriformis syndrome rather than sciatica.  Took the muscle relaxers since last Thursday and has felt better.  It had started when she got out of a taller truck.  Has been able to walk through her home w/o using her cane.  We discussed PT for her to see if she does improve. Is down to 2 hydrocodone.  Has sometimes gone as long as 14 hrs between tablets.    [facility]--HLA b27 said to be normal, but other test was positive. 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    Had a genetic test done, too, b/c she's had two kinds of cancer.  Had grandpa with gallbladder and another with prostate cancer and she's had breast  and melanoma.    Saw urologist last week.  Urine was clear.  All of that is resolved. Has not used pyridium.    Past Medical History:  Diagnosis Date  . Asymptomatic varicose veins   . Cancer (HCC)    breast   . Diverticulosis   . DM type 2 (di[place] mellitus, [place] 2[place] 10/23[place]  . Dysuria   . GERD (gastroesophageal reflux disease)   . Hiatal hernia   . Hyperlipidemia   . Malignant neoplasm of breast (female), unspecified site   . Migraine without aura, without mention of intractable migraine without mention of status migrainosus   . Other abnormal blood chemistry   . Pain in joint, pelvic region and thigh   . Shingles    in eyes  . Stricture and stenosis of esophagus   . Trigger finger (acquired)   . Unspecified cataract   . Unspecified glaucoma(365.9)     Past Surgical History:  Procedure Laterality Date  . APPENDECTOMY  1973   Dr. CLinzie Collin . BREAST LUMPECTOMY Right 08/29/2008   Dr. TErroll Luna . ESOPHAGEAL DILATION    . NASAL POLYP EXCISION  10/2015  . POLYPECTOMY  11/20/14   Uterine Polyp Removed    Allergies  Allergen Reactions  . Adhesive [Tape] Dermatitis  .  Betimol [Timolol Maleate]     Allergic to preservatives   . Cosopt [Dorzolamide Hcl-Timolol Mal] Other (See Comments)    Turns eye red, increases eye pressure  . Penicillins   . Tamoxifen Other (See Comments)    Vaginal bleeding  . Thimerosal     Makes pt eye red     Outpatient Encounter Medications as of 09/26/2017  Medication Sig  . ARTIFICIAL TEAR OP Place 1 % into the right eye daily.   . Calcium Citrate-Vitamin D 250-200 MG-UNIT TABS Take 250 mg by mouth 2 (two) times daily.  . Cholecalciferol (VITAMIN D-3) 5000 units TABS Take by mouth daily.  . Cyanocobalamin (B-12) 5000 MCG CAPS Take by mouth daily.  . cyclobenzaprine (FLEXERIL) 10 MG tablet Take 10 mg by mouth 2 (two) times daily.  . fluorometholone (FML) 0.1 % ophthalmic suspension Place 1 drop into the right eye daily.  Marland Kitchen  gabapentin (NEURONTIN) 100 MG capsule Take 1 capsule (100 mg total) by mouth at bedtime.  Marland Kitchen HYDROcodone-acetaminophen (NORCO) 10-325 MG tablet Take one tablet by mouth every 8 hours as needed for pain  . LORazepam (ATIVAN) 1 MG tablet TAKE 1/2-1 TABLET AT BEDTIME AS NEEDED FOR SLEEP  . meloxicam (MOBIC) 7.5 MG tablet Take 7.5 mg by mouth daily.  Marland Kitchen omeprazole (PRILOSEC) 40 MG capsule Take 40 mg by mouth.  . phenazopyridine (PYRIDIUM) 100 MG tablet Take 100 mg by mouth every 4 (four) hours as needed.  . predniSONE (DELTASONE) 5 MG tablet TAKE 3 TABLETS (15 MG TOTAL) BY MOUTH DAILY WITH BREAKFAST.  Marland Kitchen timolol (TIMOPTIC) 0.5 % ophthalmic solution PLACE 1 DROP INTO THE RIGHT EYE 2 TIMES DAILY.  . valACYclovir (VALTREX) 1000 MG tablet Take 1,000 mg by mouth daily.   . [DISCONTINUED] Cranberry 1000 MG CAPS Take 1 capsule by mouth daily.  . [DISCONTINUED] Cyanocobalamin (B-12) 1000 MCG CAPS Take by mouth daily.   No facility-administered encounter medications on file as of 09/26/2017.     Review of Systems:  Review of Systems  Constitutional: Negative for chills, fever and malaise/fatigue.  HENT: Negative for congestion.   Eyes: Negative for blurred vision.  Respiratory: Negative for cough and shortness of breath.   Cardiovascular: Negative for chest pain, palpitations and leg swelling.  Gastrointestinal: Negative for abdominal pain, blood in stool, constipation and melena.  Genitourinary: Negative for dysuria.  Musculoskeletal: Positive for joint pain. Negative for back pain and falls.       Shoulders improved as well as hip with prednisone  Skin:       No change in wrist mass per MRIs with oncology at [facility]  Neurological: Negative for dizziness, loss of consciousness and weakness.  Psychiatric/Behavioral: Negative for depression and memory loss. The patient is not nervous/anxious and does not have insomnia.     Health Maintenance  Topic Date Due  . Hepatitis C Screening  01/02/1946  .  URINE MICROALBUMIN  07/03/1955  . INFLUENZA VACCINE  02/23/2017  . MAMMOGRAM  07/27/2017  . COLONOSCOPY  03/26/2019  . TETANUS/TDAP  06/25/2019  . DEXA SCAN  Completed  . PNA vac Low Risk Adult  Completed    Physical Exam: Vitals:   09/26/17 1440  BP: 138/70  Pulse: 85  Temp: 98 F (36.7 C)  TempSrc: Oral  SpO2: 95%  Weight: 238 lb (108 kg)   Body mass index is 40.85 kg/m. Physical Exam  Constitutional: She is oriented to person, place, and time. She appears well-developed and well-nourished. No distress.  Cardiovascular: Normal rate, regular rhythm, normal heart sounds and intact distal pulses.  Pulmonary/Chest: Effort normal and breath sounds normal. No respiratory distress.  Abdominal: Bowel sounds are normal.  Musculoskeletal:  Walks with limp on left leg, swings leg, tender over SI joint and into left buttock  Neurological: She is alert and oriented to person, place, and time.  Skin: Skin is warm and dry. Capillary refill takes less than 2 seconds.  Right wrist mass unchanged  Psychiatric: She has a normal mood and affect.    Labs reviewed: Basic Metabolic Panel: Recent Labs    10/29/16 1041 05/26/17 1451  NA 139 136  K 4.4 4.2  CL 102 98  CO2 31 29  GLUCOSE 112* 97  BUN 15 8  CREATININE 0.59* 0.55*  CALCIUM 9.6 10.3   Liver Function Tests: No results for input(s): AST, ALT, ALKPHOS, BILITOT, PROT, ALBUMIN in the last 8760 hours. No results for input(s): LIPASE, AMYLASE in the last 8760 hours. No results for input(s): AMMONIA in the last 8760 hours. CBC: Recent Labs    05/26/17 1451  WBC 7.0  NEUTROABS 4,739  HGB 12.7  HCT 36.7  MCV 92.9  PLT 311   Lipid Panel: Recent Labs    10/29/16 1049  CHOL 236*  HDL 60  LDLCALC 146*  TRIG 149  CHOLHDL 3.9   Lab Results  Component Value Date   HGBA1C 5.8 (H) 05/26/2017   Assessment/Plan 1. Malignant melanoma of right upper extremity ([place]) -continue monitoring with [facility] been  stable  2. Neurogenic claudication due to lumbar spinal stenosis - is agreeable to PT, cont hydrocodone, flexeril primarily at hs and emphasized importance of not driving when taking these meds - cyclobenzaprine (FLEXERIL) 10 MG tablet; Take 1 tablet (10 mg total) by mouth 2 (two) times daily.  Dispense: 30 tablet; Refill: 0 - Ambulatory referral to Physical Therapy for "left hip pain" which seems radicular from her lumbar spine moreso to me, had minor improvement with steroids for PMR   3. Polymyalgia rheumatica ([place]) -had presented with significant stiffness of shoulders and hips which got better with the prednisone - due to ongoing significant hip pain, maintain this dose until left sided pain improves, also on mobic--educated on importance of taking these with food - Sedimentation rate - Ambulatory referral to Physical Therapy  4. Type 2 diabetes mellitus without complication, without long-term current use of insulin (HCC) -prediabetic range Lab Results  Component Value Date   HGBA1C 5.8 (H) 05/26/2017   - Ambulatory referral to Podiatry for foot exam, nail trimming  -follows regularly with ophtho  5. Class 3 severe obesity due to excess calories with serious comorbidity and body mass index (BMI) of 40.0 to 44.9 in adult [facility]) -has struggled with weight due to immobility secondary to pain, is trying to walk more  Labs/tests ordered:   Orders Placed This Encounter  Procedures  . Sedimentation rate  . Ambulatory referral to Podiatry    Referral Priority:   Routine    Referral Type:   Consultation    Referral Reason:   Specialty Services Required    Requested Specialty:   Podiatry    Number of Visits Requested:   1  . Ambulatory referral to Physical Therapy    Referral Priority:   Routine    Referral Type:   Physical Medicine    Referral Reason:   Specialty Services Required    Requested Specialty:   Physical Therapy    Number of Visits Requested:  1    Next appt:   02/02/2018   Sharryn Belding L. Nolia Tschantz, D.O. [facility] [address], [place] [address] Cell Phone (Mon-Fri 8am-5pm):  [phone] On Call:  [phone] & follow prompts after 5pm & weekends Office Phone:  [phone] Office Fax:  [phone]

## 2017-09-29 DIAGNOSIS — Z961 Presence of intraocular lens: Secondary | ICD-10-CM | POA: Diagnosis not present

## 2017-09-29 DIAGNOSIS — D3131 Benign neoplasm of right choroid: Secondary | ICD-10-CM | POA: Diagnosis not present

## 2017-09-29 DIAGNOSIS — H02401 Unspecified ptosis of right eyelid: Secondary | ICD-10-CM | POA: Diagnosis not present

## 2017-09-29 DIAGNOSIS — C792 Secondary malignant neoplasm of skin: Secondary | ICD-10-CM | POA: Diagnosis not present

## 2017-09-29 DIAGNOSIS — Z9889 Other specified postprocedural states: Secondary | ICD-10-CM | POA: Diagnosis not present

## 2017-09-29 DIAGNOSIS — H269 Unspecified cataract: Secondary | ICD-10-CM | POA: Diagnosis not present

## 2017-09-29 DIAGNOSIS — H04123 Dry eye syndrome of bilateral lacrimal glands: Secondary | ICD-10-CM | POA: Diagnosis not present

## 2017-09-29 DIAGNOSIS — H209 Unspecified iridocyclitis: Secondary | ICD-10-CM | POA: Diagnosis not present

## 2017-09-29 DIAGNOSIS — B0051 Herpesviral iridocyclitis: Secondary | ICD-10-CM | POA: Diagnosis not present

## 2017-09-29 DIAGNOSIS — Z79899 Other long term (current) drug therapy: Secondary | ICD-10-CM | POA: Diagnosis not present

## 2017-09-29 DIAGNOSIS — H4041X2 Glaucoma secondary to eye inflammation, right eye, moderate stage: Secondary | ICD-10-CM | POA: Diagnosis not present

## 2017-10-12 DIAGNOSIS — M79674 Pain in right toe(s): Secondary | ICD-10-CM | POA: Diagnosis not present

## 2017-10-12 DIAGNOSIS — R030 Elevated blood-pressure reading, without diagnosis of hypertension: Secondary | ICD-10-CM | POA: Diagnosis not present

## 2017-10-16 DIAGNOSIS — B029 Zoster without complications: Secondary | ICD-10-CM | POA: Insufficient documentation

## 2017-10-18 ENCOUNTER — Other Ambulatory Visit: Payer: Self-pay

## 2017-10-18 ENCOUNTER — Encounter: Payer: Self-pay | Admitting: Physical Therapy

## 2017-10-18 ENCOUNTER — Ambulatory Visit: Payer: Medicare Other | Attending: Internal Medicine | Admitting: Physical Therapy

## 2017-10-18 DIAGNOSIS — M25552 Pain in left hip: Secondary | ICD-10-CM | POA: Insufficient documentation

## 2017-10-18 DIAGNOSIS — M6281 Muscle weakness (generalized): Secondary | ICD-10-CM | POA: Diagnosis not present

## 2017-10-18 DIAGNOSIS — R262 Difficulty in walking, not elsewhere classified: Secondary | ICD-10-CM | POA: Diagnosis not present

## 2017-10-18 DIAGNOSIS — M25652 Stiffness of left hip, not elsewhere classified: Secondary | ICD-10-CM

## 2017-10-18 NOTE — Therapy (Signed)
[place], [place], [address] Phone: [phone]   Fax:  [phone]  Physical Therapy Evaluation  Patient Details  Name: [name] MRN: [id number] Date of Birth: 30-Aug-1945 Referring Provider: Gayland Curry    Encounter Date: 10/18/2017  PT End of Session - 10/18/17 1432    Visit Number  1    Number of Visits  17    Date for PT Re-Evaluation  11/15/17    Authorization Type  Medicare/BCBS     Authorization Time Period  10/18/17 to 12/18/17    PT Start Time  1330    PT Stop Time  1421    PT Time Calculation (min)  51 min    Activity Tolerance  Patient tolerated treatment well    Behavior During Therapy  [facility] for tasks assessed/performed       Past Medical History:  Diagnosis Date  . Asymptomatic varicose veins   . Cancer (HCC)    breast   . Diverticulosis   . DM type 2 (diabetes mellitus, type 2) ([place]) 10/23/2014  . Dysuria   . GERD (gastroesophageal reflux disease)   . Hiatal hernia   . Hyperlipidemia   . Malignant neoplasm of breast (female), unspecified site   . Migraine without aura, without mention of intractable migraine without mention of status migrainosus   . Other abnormal blood chemistry   . Pain in joint, pelvic region and thigh   . Shingles    in eyes  . Stricture and stenosis of esophagus   . Trigger finger (acquired)   . Unspecified cataract   . Unspecified glaucoma(365.9)     Past Surgical History:  Procedure Laterality Date  . APPENDECTOMY  1973   Dr. Linzie Collin  . BREAST LUMPECTOMY Right 08/29/2008   Dr. Erroll Luna  . ESOPHAGEAL DILATION    . NASAL POLYP EXCISION  10/2015  . POLYPECTOMY  11/20/14   Uterine Polyp Removed    There were no vitals filed for this visit.   Subjective Assessment - 10/18/17 1339    Subjective  Patient reports she has been getting treatments for melanoma and has been on treatments to help increase her immune system; in July she had a  follow up with her MD, where she was diagnosed with possible bursitis. She was given a steriod shot by another doctor after which she had a steriod response and she had high levels of pain and had to drag her leg due to increased pain for next 2 months. She then got another MRI which showed bulging discs and degenerative changes in her lumbar spine, she was then given an epidural. She then twisted her leg trying to stop a cat from coming inside and is back in pain again. She is back to not being able to get comfortable again. She was then diagnosed with polymyalgia rheumiatica. Now some days she is OK, other days are not good at all and her leg can buckle.     Pertinent History  stage 4 melanoma, polymyalgia rheumatica    How long can you sit comfortably?  no limits at this point     How long can you stand comfortably?  about 5 minutes     How long can you walk comfortably?  standing straight up and omving is OK; any movement with rotation or "off track" is very painful     Patient Stated Goals  get rid of pain, solve problem and be normal again  Currently in Pain?  Yes    Pain Score  2     Pain Location  Leg    Pain Orientation  Left    Pain Descriptors / Indicators  Aching;Dull    Pain Type  Chronic pain    Pain Radiating Towards  can sometimes radiate down leg to below L knee     Pain Onset  More than a month ago    Pain Frequency  Intermittent    Aggravating Factors   twisting or turning, getting out of straight plane motions     Pain Relieving Factors  warm bath, heat, shifting positions     Effect of Pain on Daily Activities  severe         [facility] PT Assessment - 10/18/17 0001      Assessment   Medical Diagnosis  L hip pain, back pain     Referring Provider  Tiffany L Reed     Onset Date/Surgical Date  -- 2018     Next MD Visit  3 months     Prior Therapy  none       Precautions   Precautions  None      Restrictions   Weight Bearing Restrictions  No      Balance Screen    Has the patient fallen in the past 6 months  No    Has the patient had a decrease in activity level because of a fear of falling?   Yes    Is the patient reluctant to leave their home because of a fear of falling?   No      Prior Function   Level of Independence  Independent;Independent with basic ADLs;Independent with transfers;Independent with gait    Vocation  Retired      Haematologist  Postural limitations    Postural Limitations  Forward head;Rounded Shoulders;Increased lumbar lordosis;Weight shift left;Flexed trunk      AROM   Lumbar Flexion  WNL; RFIS increases symptoms going down L LE     Lumbar Extension  mild limitation; REIS increases pain in lumbar spine     Lumbar - Right Side Bend  WNL     Lumbar - Left Side Bend  WNL      Strength   Right Hip Flexion  3+/5    Left Hip Flexion  3-/5    Left Hip ABduction  3/5    Right Knee Flexion  4+/5    Right Knee Extension  4+/5    Left Knee Flexion  4/5    Left Knee Extension  4/5      Flexibility   Hamstrings  WNL     Piriformis  moderate limitation L hip, mild limiation R hip       Palpation   Palpation comment  tender B TFL regions, deep muscle knotting noted L gluteals and lumbar extensors       Ambulation/Gait   Gait Comments  flexed at hips, reduced rotation of hips and trunk, reduced UE motion during gait, gait pattern consistent with leg length discrepancy               No data recorded  Objective measurements completed on examination: See above findings.              PT Education - 10/18/17 1431    Education provided  Yes    Education Details  prognosis, impairments found at evaluation, HEP, POC and prognosis, possible benefits of addressing  leg length discrepancy     Person(s) Educated  Patient    Methods  Explanation    Comprehension  Verbalized understanding       PT Short Term Goals - 10/18/17 1445      PT SHORT TERM GOAL #1   Title  Patient  to be independent with appopriate and progressive HEP, to be updated PRN     Time  2    Period  Weeks    Status  New    Target Date  11/01/17      PT SHORT TERM GOAL #2   Title  Patient to experience pain L hip as being no more than 2/10 at worst and symptoms peripheralizing no further than L greater trochanter in order to improve QOL and activity tolerance     Time  4    Period  Weeks    Status  New    Target Date  11/15/17      PT SHORT TERM GOAL #3   Title  Patient to demonstrate an improvement of at least 1 MMT in grade in all tested  muscle groups in order to assist in improving mobility and reducing pain     Time  4    Period  Weeks    Status  New    Target Date  11/15/17      PT SHORT TERM GOAL #4   Title  Patient to be able to maintain correct upright posture at least 75% of the time without external cues in order to reduce pain and assist in addressing soft tissue and flexibility limitaitons     Time  4    Period  Weeks    Status  New    Target Date  11/15/17        PT Long Term Goals - 10/18/17 1450      PT LONG TERM GOAL #1   Title  Patient to demonstrate L hip as being no more than 30% limited in flexibility all planes in order to allow her to more easily perform functional tasks such as putting shoes and socks on     Time  8    Period  Weeks    Status  New    Target Date  12/13/17      PT LONG TERM GOAL #2   Title  Patient to demonstrate improved gait mechanics including improved hip rotation and mobility during gait, equal step/stance times, and upright posture in order to improve quality of motion and reduce pain     Time  8    Period  Weeks    Status  New      PT LONG TERM GOAL #3   Title  Patient to demonstrate correct functional biomechanics for tasks such as bed mobility, loading/unloading drier, and getting up into high car in order to prevent exacerbation of pain     Time  8    Period  Weeks    Status  New      PT LONG TERM GOAL #4   Title   Patient to be able to stand and ambulate without increase in pain for at least 60 minutes in order to improve QOL and assist in return to prior level of activity     Time  8    Period  Weeks    Status  New             Plan - 10/18/17 1442    Clinical Impression Statement  Patient arrives today reporting high levels of frustration due to pain based limitations; she is typically very active but has not been able to perform her normal activities due to pain. Of note, she reports leg length discrepancy that has not been addressed and she has had it for as long as she can remember. Examination confirms leg length discrepancy with R LE being considerably shorter than L, also reveals poor posture, functional strength deficits, soft tissue and flexibility limitations, and gait deviation. She will benefit from skilled PT services to address functional deficits, reduce pain, and to potentially address leg length discrepancy moving forward.    History and Personal Factors relevant to plan of care:  chronicity of pain, chronic leg length discrepancy     Clinical Presentation  Stable    Clinical Presentation due to:  compensation patterns, deconditioning     Clinical Decision Making  Moderate    Rehab Potential  Good    Clinical Impairments Affecting Rehab Potential  (+) motivated, reports high level of acttivity at baseline; (-) leg length discrepancy, obesity, chronicity of pain     PT Frequency  2x / week    PT Duration  8 weeks    PT Treatment/Interventions  ADLs/Self Care Home Management;Cryotherapy;Electrical Stimulation;Iontophoresis 4mg /ml Dexamethasone;Moist Heat;Traction;Ultrasound;Gait training;Stair training;Functional mobility training;Therapeutic activities;Therapeutic exercise;Balance training;Neuromuscular re-education;Patient/family education    PT Next Visit Plan  review HEP, trial of heel lift for leg length discrepancy. Work on lumbar and hip soft tissue limitaitons and flexibilty.  Core nad proximal strength.     PT Home Exercise Plan  Eval: lumbar rotations, hip flexor stretch, 3D hip excursions     Consulted and Agree with Plan of Care  Patient       Patient will benefit from skilled therapeutic intervention in order to improve the following deficits and impairments:  Abnormal gait, Increased fascial restricitons, Improper body mechanics, Pain, Increased muscle spasms, Postural dysfunction, Decreased strength, Hypomobility, Decreased mobility, Decreased safety awareness, Difficulty walking, Impaired flexibility  Visit Diagnosis: Pain in left hip - Plan: PT plan of care cert/re-cert  Difficulty in walking, not elsewhere classified - Plan: PT plan of care cert/re-cert  Stiffness of left hip, not elsewhere classified - Plan: PT plan of care cert/re-cert  Muscle weakness (generalized) - Plan: PT plan of care cert/re-cert     Problem List Patient Active Problem List   Diagnosis Date Noted  . Prediabetes 05/26/2017  . Dysuria 11/03/2016  . Insomnia 11/03/2016  . Glaucoma 05/05/2016  . LPRD (laryngopharyngeal reflux disease) 01/26/2016  . Erosive esophagitis 11/27/2015  . Schatzki's ring of distal esophagus 11/27/2015  . Chronic sinusitis 10/16/2015  . Deviated nasal septum 10/16/2015  . Nasal polyp 10/16/2015  . Osteoporosis 08/15/2015  . Dysphagia, pharyngoesophageal phase 04/01/2015  . Endometrial polyp 10/24/2014  . Fatigue 07/09/2014  . Hyperlipidemia   . Breast cancer, right ([place]) 02/01/2014  . Malignant melanoma of arm ([place]) 11/23/2013  . Nasal septum ulceration 10/16/2013  . Seborrheic keratosis 10/16/2013  . Vitamin D deficiency 02/08/2013  . Sciatica of right side 01/24/2013  . Esophageal ulcer 01/24/2013  . Esophageal stenosis 01/24/2013  . Choroidal nevus of right eye 10/27/2012  . Impacted cerumen of right ear 10/11/2012  . Frequency 01/19/2012  . Gastroesophageal reflux disease 09/01/2011  . Horseshoe tear of retina without detachment  08/03/2011  . 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 H/O cataract extraction 04/30/2011  . Borderline glaucoma steroid responder 04/30/2011  . Obesity 10/16/2008  . Breast cancer of lower-outer quadrant of right  female breast ([place]) 08/29/2008    Deniece Ree PT, DPT, [place]  Supplemental Physical Therapist [place]   Pager [facility] [address] [place], [place], [address] Phone: [phone]   Fax:  [phone]  Name: [name] MRN: [id number] Date of Birth: 07/30/1945

## 2017-10-18 NOTE — Patient Instructions (Signed)
   Lumbar Rotations   Lying on your back with your knees bent, slowly drop your legs to one side and hold the stretch. Come back to the middle and switch sides. You should feel the stretch in your back on the opposite side that your legs are leaning.  Hold for 2-3 seconds, then come back to midline and stretch in the other direction.   Repeat 5 times each side, 2-3 times per day.     HIP FLEXOR STRETCH  While lying on a table or high bed, let the affected leg lower towards the floor until a stretch is felt along the front of your thigh.  You should feel a stretch in the front of your leg.   Try to hold for 20-30 seconds then relax.  Repeat 2 times each leg, twice a day.   3D HIP EXCURSIONS  1) With very small motions, gently push your hips forward and backward; you should not be bending from your back.   2) Shift your hips from side to side, leading with your hip bone. It should feel like you are sticking your hip out to the side.   3) Rotating at your hips, slowly and carefully turn from side to side; you should feel a stretch in your hips.  Repeat 10 times each exercise as tolerated- you do not need to push through pain, but stretching is OK.

## 2017-10-20 ENCOUNTER — Ambulatory Visit: Payer: Medicare Other

## 2017-10-20 DIAGNOSIS — M6281 Muscle weakness (generalized): Secondary | ICD-10-CM

## 2017-10-20 DIAGNOSIS — M25652 Stiffness of left hip, not elsewhere classified: Secondary | ICD-10-CM

## 2017-10-20 DIAGNOSIS — M25552 Pain in left hip: Secondary | ICD-10-CM | POA: Diagnosis not present

## 2017-10-20 DIAGNOSIS — R262 Difficulty in walking, not elsewhere classified: Secondary | ICD-10-CM | POA: Diagnosis not present

## 2017-10-20 NOTE — Therapy (Signed)
[place], [place], [address] Phone: [phone]   Fax:  [phone]  Physical Therapy Treatment  Patient Details  Name: [name] MRN: [id number] Date of Birth: [birth year] Referring Provider: Gayland Curry    Encounter Date: 10/20/2017  PT End of Session - 10/20/17 1330    Visit Number  2    Number of Visits  17    Date for PT Re-Evaluation  11/15/17    Authorization Type  Medicare/BCBS     Authorization Time Period  10/18/17 to 12/18/17    PT Start Time  0130    PT Stop Time  0215    PT Time Calculation (min)  45 min    Activity Tolerance  Patient tolerated treatment well    Behavior During Therapy  [facility] for tasks assessed/performed       Past Medical History:  Diagnosis Date  . Asymptomatic varicose veins   . Cancer (HCC)    breast   . Diverticulosis   . DM type 2 (diabetes mellitus, type 2) ([place]) 10/23/2014  . Dysuria   . GERD (gastroesophageal reflux disease)   . Hiatal hernia   . Hyperlipidemia   . Malignant neoplasm of breast (female), unspecified site   . Migraine without aura, without mention of intractable migraine without mention of status migrainosus   . Other abnormal blood chemistry   . Pain in joint, pelvic region and thigh   . Shingles    in eyes  . Stricture and stenosis of esophagus   . Trigger finger (acquired)   . Unspecified cataract   . Unspecified glaucoma(365.9)     Past Surgical History:  Procedure Laterality Date  . APPENDECTOMY  1973   Dr. Linzie Collin  . BREAST LUMPECTOMY Right 08/29/2008   Dr. Erroll Luna  . ESOPHAGEAL DILATION    . NASAL POLYP EXCISION  10/2015  . POLYPECTOMY  11/20/14   Uterine Polyp Removed    There were no vitals filed for this visit.  Subjective Assessment - 10/20/17 1333    Subjective  She reports doing better .   Walks without cane .  One episode with shock of nerve.  Leg gave away today.     Currently in Pain?  No/denies with  medication.    Pain Location  Hip    Pain Orientation  Left    Pain Descriptors / Indicators  Aching    Pain Type  Chronic pain         [facility] PT Assessment - 10/20/17 0001      Standardized Balance Assessment   Standardized Balance Assessment  Berg Balance Test            No data recorded       [facility] Adult PT Treatment/Exercise - 10/20/17 0001      Exercises   Exercises  Knee/Hip      Lumbar Exercises: Stretches   Pelvic Tilt  10 reps;5 seconds      Lumbar Exercises: Sidelying   Clam  Left    Clam Limitations  12 green band then reverse clam no band    Hip Abduction  10 reps;Left green band      Knee/Hip Exercises: Supine   Bridges Limitations  2 reps then stopped due to Lt hamstring cramp    Other Supine Knee/Hip Exercises  Bent knee  lift x 6 RT and Lt  with abdominal set, then abd set with ball squeeze x10 then green band clam  with ab set x 12         Manual Therapy   Manual Therapy  Joint mobilization;Manual Traction    Joint Mobilization  Gr 2-3 PA glides to LT hip    Manual Traction  x 150 Gr 3-4 and rotations             PT Education - 10/20/17 1408    Education provided  Yes    Education Details  HEP    Person(s) Educated  Patient    Methods  Explanation;Tactile cues;Verbal cues;Handout    Comprehension  Returned demonstration;Verbalized understanding       PT Short Term Goals - 10/18/17 1445      PT SHORT TERM GOAL #1   Title  Patient to be independent with appopriate and progressive HEP, to be updated PRN     Time  2    Period  Weeks    Status  New    Target Date  11/01/17      PT SHORT TERM GOAL #2   Title  Patient to experience pain L hip as being no more than 2/10 at worst and symptoms peripheralizing no further than L greater trochanter in order to improve QOL and activity tolerance     Time  4    Period  Weeks    Status  New    Target Date  11/15/17      PT SHORT TERM GOAL #3   Title  Patient to demonstrate an  improvement of at least 1 MMT in grade in all tested  muscle groups in order to assist in improving mobility and reducing pain     Time  4    Period  Weeks    Status  New    Target Date  11/15/17      PT SHORT TERM GOAL #4   Title  Patient to be able to maintain correct upright posture at least 75% of the time without external cues in order to reduce pain and assist in addressing soft tissue and flexibility limitaitons     Time  4    Period  Weeks    Status  New    Target Date  11/15/17        PT Long Term Goals - 10/18/17 1450      PT LONG TERM GOAL #1   Title  Patient to demonstrate L hip as being no more than 30% limited in flexibility all planes in order to allow her to more easily perform functional tasks such as putting shoes and socks on     Time  8    Period  Weeks    Status  New    Target Date  12/13/17      PT LONG TERM GOAL #2   Title  Patient to demonstrate improved gait mechanics including improved hip rotation and mobility during gait, equal step/stance times, and upright posture in order to improve quality of motion and reduce pain     Time  8    Period  Weeks    Status  New      PT LONG TERM GOAL #3   Title  Patient to demonstrate correct functional biomechanics for tasks such as bed mobility, loading/unloading drier, and getting up into high car in order to prevent exacerbation of pain     Time  8    Period  Weeks    Status  New      PT LONG TERM GOAL #  4   Title  Patient to be able to stand and ambulate without increase in pain for at least 60 minutes in order to improve QOL and assist in return to prior level of activity     Time  8    Period  Weeks    Status  New            Plan - 10/20/17 1330    Clinical Impression Statement  she did HEP correctly and we added to HEP . She appears to have some significant OA of LT hip with significant  decr ROM compared to LT     PT Treatment/Interventions  ADLs/Self Care Home Management;Cryotherapy;Electrical  Stimulation;Iontophoresis 4mg /ml Dexamethasone;Moist Heat;Traction;Ultrasound;Gait training;Stair training;Functional mobility training;Therapeutic activities;Therapeutic exercise;Balance training;Neuromuscular re-education;Patient/family education    PT Next Visit Plan  review HEP, trial of heel lift for leg length discrepancy. Work on lumbar and hip soft tissue limitaitons and flexibilty. Core nad proximal strength.     PT Home Exercise Plan  Eval: lumbar rotations, hip flexor stretch, 3D hip excursions , PPT , Marching , clam     Consulted and Agree with Plan of Care  Patient       Patient will benefit from skilled therapeutic intervention in order to improve the following deficits and impairments:  Abnormal gait, Increased fascial restricitons, Improper body mechanics, Pain, Increased muscle spasms, Postural dysfunction, Decreased strength, Hypomobility, Decreased mobility, Decreased safety awareness, Difficulty walking, Impaired flexibility  Visit Diagnosis: Pain in left hip  Difficulty in walking, not elsewhere classified  Stiffness of left hip, not elsewhere classified  Muscle weakness (generalized)     Problem List Patient Active Problem List   Diagnosis Date Noted  . Prediabetes 05/26/2017  . Dysuria 11/03/2016  . Insomnia 11/03/2016  . Glaucoma 05/05/2016  . LPRD (laryngopharyngeal reflux disease) 01/26/2016  . Erosive esophagitis 11/27/2015  . Schatzki's ring of distal esophagus 11/27/2015  . Chronic sinusitis 10/16/2015  . Deviated nasal septum 10/16/2015  . Nasal polyp 10/16/2015  . Osteoporosis 08/15/2015  . Dysphagia, pharyngoesophageal phase 04/01/2015  . Endometrial polyp 10/24/2014  . Fatigue 07/09/2014  . Hyperlipidemia   . Breast cancer, right ([place]) 02/01/2014  . Malignant melanoma of arm ([place]) 11/23/2013  . Nasal septum ulceration 10/16/2013  . Seborrheic keratosis 10/16/2013  . Vitamin D deficiency 02/08/2013  . Sciatica of right side 01/24/2013  .  Esophageal ulcer 01/24/2013  . Esophageal stenosis 01/24/2013  . Choroidal nevus of right eye 10/27/2012  . Impacted cerumen of right ear 10/11/2012  . Frequency 01/19/2012  . Gastroesophageal reflux disease 09/01/2011  . 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 H/O cataract extraction 04/30/2011  . Borderline glaucoma steroid responder 04/30/2011  . Obesity 10/16/2008  . Breast cancer of lower-outer quadrant of right female breast ([place]) 08/29/2008    Darrel Hoover PT  10/20/2017, 2:16 PM  [facility] [address] [place], [place], [address] Phone: [phone]   Fax:  [phone]  Name: [name] MRN: [id number] Date of Birth: January 29, 1946

## 2017-10-20 NOTE — Patient Instructions (Signed)
Issued PPT , Marching , clams  2x/day 10-20 reps

## 2017-10-25 ENCOUNTER — Ambulatory Visit: Payer: Medicare Other | Attending: Internal Medicine

## 2017-10-25 DIAGNOSIS — R262 Difficulty in walking, not elsewhere classified: Secondary | ICD-10-CM

## 2017-10-25 DIAGNOSIS — M25552 Pain in left hip: Secondary | ICD-10-CM | POA: Diagnosis not present

## 2017-10-25 DIAGNOSIS — M25652 Stiffness of left hip, not elsewhere classified: Secondary | ICD-10-CM | POA: Diagnosis not present

## 2017-10-25 DIAGNOSIS — M6281 Muscle weakness (generalized): Secondary | ICD-10-CM | POA: Insufficient documentation

## 2017-10-25 NOTE — Therapy (Signed)
[place], [place], [address] Phone: [phone]   Fax:  [phone]  Physical Therapy Treatment  Patient Details  Name: [name] MRN: [id number] Date of Birth: 02-19-1946 Referring Provider: Gayland Curry    Encounter Date: 10/25/2017  PT End of Session - 10/25/17 1417    Visit Number  3    Number of Visits  17    Date for PT Re-Evaluation  11/15/17    Authorization Type  Medicare/BCBS     PT Start Time  0217    PT Stop Time  0300    PT Time Calculation (min)  43 min    Activity Tolerance  Patient tolerated treatment well    Behavior During Therapy  [facility] for tasks assessed/performed       Past Medical History:  Diagnosis Date  . Asymptomatic varicose veins   . Cancer (HCC)    breast   . Diverticulosis   . DM type 2 (diabetes mellitus, type 2) ([place]) 10/23/2014  . Dysuria   . GERD (gastroesophageal reflux disease)   . Hiatal hernia   . Hyperlipidemia   . Malignant neoplasm of breast (female), unspecified site   . Migraine without aura, without mention of intractable migraine without mention of status migrainosus   . Other abnormal blood chemistry   . Pain in joint, pelvic region and thigh   . Shingles    in eyes  . Stricture and stenosis of esophagus   . Trigger finger (acquired)   . Unspecified cataract   . Unspecified glaucoma(365.9)     Past Surgical History:  Procedure Laterality Date  . APPENDECTOMY  1973   Dr. Linzie Collin  . BREAST LUMPECTOMY Right 08/29/2008   Dr. Erroll Luna  . ESOPHAGEAL DILATION    . NASAL POLYP EXCISION  10/2015  . POLYPECTOMY  11/20/14   Uterine Polyp Removed    There were no vitals filed for this visit.  Subjective Assessment - 10/25/17 1422    Subjective  Hip pain after last session.    Back OK     Pain Score  3     Pain Location  Hip    Pain Orientation  Left    Pain Descriptors / Indicators  Aching    Pain Type  Chronic pain    Pain Onset   More than a month ago    Pain Frequency  Intermittent    Pain Relieving Factors  heat , meds                        [facility] Adult PT Treatment/Exercise - 10/25/17 0001      Lumbar Exercises: Stretches   Lower Trunk Rotation  Limitations    Lower Trunk Rotation Limitations  T and LT 12 reps    Pelvic Tilt  10 reps;5 seconds      Lumbar Exercises: Sidelying   Hip Abduction  Left bent leg x 12      Knee/Hip Exercises: Supine   Bridges Limitations  12 reps with legs on large bolster    Other Supine Knee/Hip Exercises  Bent knee  lift x12  RT and Lt  with abdominal set, then abd set with ball squeeze x12 then blue  band clam with ab set x 15         Manual Therapy   Manual Therapy  Taping    Manual Traction  pulls gr 3 3 bouts of 50 reps  Kinesiotex  Inhibit Muscle             PT Education - 10/25/17 1504    Education provided  Yes    Education Details  kineseotape management that can get wet but need to remove if skin irritated at all.      Person(s) Educated  Patient    Methods  Explanation    Comprehension  Verbalized understanding       PT Short Term Goals - 10/25/17 1505      PT SHORT TERM GOAL #1   Title  Patient to be independent with appopriate and progressive HEP, to be updated PRN     Status  On-going      PT SHORT TERM GOAL #2   Title  Patient to experience pain L hip as being no more than 2/10 at worst and symptoms peripheralizing no further than L greater trochanter in order to improve QOL and activity tolerance     Status  On-going      PT SHORT TERM GOAL #3   Title  Patient to demonstrate an improvement of at least 1 MMT in grade in all tested  muscle groups in order to assist in improving mobility and reducing pain     Status  On-going      PT SHORT TERM GOAL #4   Title  Patient to be able to maintain correct upright posture at least 75% of the time without external cues in order to reduce pain and assist in addressing soft tissue  and flexibility limitaitons     Status  On-going        PT Long Term Goals - 10/18/17 1450      PT LONG TERM GOAL #1   Title  Patient to demonstrate L hip as being no more than 30% limited in flexibility all planes in order to allow her to more easily perform functional tasks such as putting shoes and socks on     Time  8    Period  Weeks    Status  New    Target Date  12/13/17      PT LONG TERM GOAL #2   Title  Patient to demonstrate improved gait mechanics including improved hip rotation and mobility during gait, equal step/stance times, and upright posture in order to improve quality of motion and reduce pain     Time  8    Period  Weeks    Status  New      PT LONG TERM GOAL #3   Title  Patient to demonstrate correct functional biomechanics for tasks such as bed mobility, loading/unloading drier, and getting up into high car in order to prevent exacerbation of pain     Time  8    Period  Weeks    Status  New      PT LONG TERM GOAL #4   Title  Patient to be able to stand and ambulate without increase in pain for at least 60 minutes in order to improve QOL and assist in return to prior level of activity     Time  8    Period  Weeks    Status  New            Plan - 10/25/17 1421    Clinical Impression Statement  Will back off range of hip as pain post stretching. Cont pulls as she reported this felt good .  tolerated exercises depending on direction of movement.  She reported no back pain and LT hip feeling good but tight end of session.     Clinical Impairments Affecting Rehab Potential  (+) motivated, reports high level of acttivity at baseline; (-) leg length discrepancy, obesity, chronicity of pain     PT Treatment/Interventions  ADLs/Self Care Home Management;Cryotherapy;Electrical Stimulation;Iontophoresis 4mg /ml Dexamethasone;Moist Heat;Traction;Ultrasound;Gait training;Stair training;Functional mobility training;Therapeutic activities;Therapeutic exercise;Balance  training;Neuromuscular re-education;Patient/family education    PT Next Visit Plan  review HEP, trial of heel lift for leg length discrepancy. Work on lumbar and hip soft tissue limitaitons and flexibilty. Core nad proximal strength.     PT Home Exercise Plan  Eval: lumbar rotations, hip flexor stretch, 3D hip excursions , PPT , Marching , clam     Consulted and Agree with Plan of Care  Patient       Patient will benefit from skilled therapeutic intervention in order to improve the following deficits and impairments:  Abnormal gait, Increased fascial restricitons, Improper body mechanics, Pain, Increased muscle spasms, Postural dysfunction, Decreased strength, Hypomobility, Decreased mobility, Decreased safety awareness, Difficulty walking, Impaired flexibility  Visit Diagnosis: Pain in left hip  Difficulty in walking, not elsewhere classified  Stiffness of left hip, not elsewhere classified  Muscle weakness (generalized)     Problem List Patient Active Problem List   Diagnosis Date Noted  . Prediabetes 05/26/2017  . Dysuria 11/03/2016  . Insomnia 11/03/2016  . Glaucoma 05/05/2016  . LPRD (laryngopharyngeal reflux disease) 01/26/2016  . Erosive esophagitis 11/27/2015  . Schatzki's ring of distal esophagus 11/27/2015  . Chronic sinusitis 10/16/2015  . Deviated nasal septum 10/16/2015  . Nasal polyp 10/16/2015  . Osteoporosis 08/15/2015  . Dysphagia, pharyngoesophageal phase 04/01/2015  . Endometrial polyp 10/24/2014  . Fatigue 07/09/2014  . Hyperlipidemia   . Breast cancer, right ([place]) 02/01/2014  . Malignant melanoma of arm ([place]) 11/23/2013  . Nasal septum ulceration 10/16/2013  . Seborrheic keratosis 10/16/2013  . Vitamin D deficiency 02/08/2013  . Sciatica of right side 01/24/2013  . Esophageal ulcer 01/24/2013  . Esophageal stenosis 01/24/2013  . Choroidal nevus of right eye 10/27/2012  . Impacted cerumen of right ear 10/11/2012  . Frequency 01/19/2012  .  Gastroesophageal reflux disease 09/01/2011  . 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 H/O cataract extraction 04/30/2011  . Borderline glaucoma steroid responder 04/30/2011  . Obesity 10/16/2008  . Breast cancer of lower-outer quadrant of right female breast ([place]) 08/29/2008    Darrel Hoover  PT  10/25/2017, 3:07 PM  [facility] [address] [place], [place], [address] Phone: [phone]   Fax:  [phone]  Name: [name] MRN: [id number] Date of Birth: 09-Oct-1945

## 2017-10-26 ENCOUNTER — Encounter: Payer: Self-pay | Admitting: Podiatry

## 2017-10-26 ENCOUNTER — Ambulatory Visit (INDEPENDENT_AMBULATORY_CARE_PROVIDER_SITE_OTHER): Payer: Medicare Other | Admitting: Podiatry

## 2017-10-26 VITALS — BP 198/96 | HR 84

## 2017-10-26 DIAGNOSIS — M79675 Pain in left toe(s): Secondary | ICD-10-CM | POA: Diagnosis not present

## 2017-10-26 DIAGNOSIS — B351 Tinea unguium: Secondary | ICD-10-CM | POA: Diagnosis not present

## 2017-10-26 DIAGNOSIS — M79674 Pain in right toe(s): Secondary | ICD-10-CM

## 2017-10-26 NOTE — Progress Notes (Addendum)
This patient presents to the office with chief complaint of long thick nails on both third toes and an evaluation of her  diabetic feet.  This patient  says she  is having no pain and discomfort on her  feet.  This patient says she  has long thick painful third toes both feet. .  These nails are painful walking and wearing hshoes.  He has no history of infection or drainage from both feet.  This patient presents the office today for treatment of the  long nails and a foot evaluation due to history of  Diabetes. She gives a history of having stepped on a foreign body months ago.  Xrays were taken revealing no foreign body in her right great toe.  She says she kept soaking her toe and eventually pus and the foreign body exited her toe.  The toe now has peeling skin at the site of the FB penetration.  Np pain or drainage..  General Appearance  Alert, conversant and in no acute stress.  Vascular  Dorsalis pedis and posterior tibial  pulses are palpable  bilaterally.  Capillary return is within normal limits  bilaterally. Temperature is within normal limits  bilaterally.  Neurologic  Senn-Weinstein monofilament wire test within normal limits  bilaterally. Muscle power within normal limits bilaterally.  Nails Thick disfigured discolored nails with subungual debris  Third toenails both feet.. No evidence of bacterial infection or drainage bilaterally.  Orthopedic  No limitations of motion of motion feet .  No crepitus or effusions noted.  DJD 1st MPJ  B/L.    Skin  normotropic skin with no porokeratosis noted bilaterally.  No signs of infections or ulcers noted.  S/P skin healing at site of FB penetration.   Onychomycosis  Diabetes with no foot complications  IE  Debride nails x 2.  A diabetic foot exam was performed and there is no evidence of any vascular or neurologic pathology.   RTC 3 months for preventative foot care services. Xrays were reviewed revealing no evidence of foreign body.   Gardiner Barefoot DPM

## 2017-10-27 ENCOUNTER — Ambulatory Visit: Payer: Medicare Other | Admitting: Physical Therapy

## 2017-10-27 ENCOUNTER — Encounter: Payer: Self-pay | Admitting: Physical Therapy

## 2017-10-27 DIAGNOSIS — M6281 Muscle weakness (generalized): Secondary | ICD-10-CM | POA: Diagnosis not present

## 2017-10-27 DIAGNOSIS — M25552 Pain in left hip: Secondary | ICD-10-CM

## 2017-10-27 DIAGNOSIS — M25652 Stiffness of left hip, not elsewhere classified: Secondary | ICD-10-CM

## 2017-10-27 DIAGNOSIS — R262 Difficulty in walking, not elsewhere classified: Secondary | ICD-10-CM | POA: Diagnosis not present

## 2017-10-27 NOTE — Therapy (Signed)
[place], [place], [address] Phone: [phone]   Fax:  [phone]  Physical Therapy Treatment  Patient Details  Name: [name] MRN: [id number] Date of Birth: 11-17-1945 Referring Provider: Gayland Curry    Encounter Date: 10/27/2017  PT End of Session - 10/27/17 1325    Visit Number  4    Number of Visits  17    Date for PT Re-Evaluation  11/15/17    PT Start Time  1325    PT Stop Time  1406    PT Time Calculation (min)  41 min    Activity Tolerance  Patient tolerated treatment well    Behavior During Therapy  [facility] for tasks assessed/performed       Past Medical History:  Diagnosis Date  . Asymptomatic varicose veins   . Cancer (HCC)    breast   . Diverticulosis   . DM type 2 (diabetes mellitus, type 2) ([place]) 10/23/2014  . Dysuria   . GERD (gastroesophageal reflux disease)   . Hiatal hernia   . Hyperlipidemia   . Malignant neoplasm of breast (female), unspecified site   . Migraine without aura, without mention of intractable migraine without mention of status migrainosus   . Other abnormal blood chemistry   . Pain in joint, pelvic region and thigh   . Shingles    in eyes  . Stricture and stenosis of esophagus   . Trigger finger (acquired)   . Unspecified cataract   . Unspecified glaucoma(365.9)     Past Surgical History:  Procedure Laterality Date  . APPENDECTOMY  1973   Dr. Linzie Collin  . BREAST LUMPECTOMY Right 08/29/2008   Dr. Erroll Luna  . ESOPHAGEAL DILATION    . NASAL POLYP EXCISION  10/2015  . POLYPECTOMY  11/20/14   Uterine Polyp Removed    There were no vitals filed for this visit.  Subjective Assessment - 10/27/17 1321    Subjective  "I saw a podiatrist and they took a piece of glass out of it and they did it on both feet.  I have having incresed soreness in the hip which it seems to be increasing since I started PT and it is going down to the L calf."    Currently  in Pain?  Yes    Pain Score  4     Pain Location  Hip    Pain Orientation  Right    Pain Type  Chronic pain    Pain Onset  More than a month ago    Pain Frequency  Intermittent    Aggravating Factors   standing/ walking, twisting or turning.                        [facility] Adult PT Treatment/Exercise - 10/27/17 1355      Lumbar Exercises: Stretches   Piriformis Stretch  2 reps;30 seconds;Left      Knee/Hip Exercises: Supine   Other Supine Knee/Hip Exercises  isometric adductor squeeze 2 x 10 5 sec hold      Knee/Hip Exercises: Sidelying   Other Sidelying Knee/Hip Exercises  reverse clam shell 2 x 10      Manual Therapy   Manual therapy comments  MTPR along the glute medius and piriformis    Manual Traction  LAD gr 3 3 bouts of 50 reps              PT Education - 10/27/17  34    Education provided  Yes    Education Details  updated HEP for piriformis stretch and adductor ball squeeze    Person(s) Educated  Patient    Methods  Explanation;Verbal cues;Handout    Comprehension  Verbalized understanding;Verbal cues required       PT Short Term Goals - 10/25/17 1505      PT SHORT TERM GOAL #1   Title  Patient to be independent with appopriate and progressive HEP, to be updated PRN     Status  On-going      PT SHORT TERM GOAL #2   Title  Patient to experience pain L hip as being no more than 2/10 at worst and symptoms peripheralizing no further than L greater trochanter in order to improve QOL and activity tolerance     Status  On-going      PT SHORT TERM GOAL #3   Title  Patient to demonstrate an improvement of at least 1 MMT in grade in all tested  muscle groups in order to assist in improving mobility and reducing pain     Status  On-going      PT SHORT TERM GOAL #4   Title  Patient to be able to maintain correct upright posture at least 75% of the time without external cues in order to reduce pain and assist in addressing soft tissue and flexibility  limitaitons     Status  On-going        PT Long Term Goals - 10/18/17 1450      PT LONG TERM GOAL #1   Title  Patient to demonstrate L hip as being no more than 30% limited in flexibility all planes in order to allow her to more easily perform functional tasks such as putting shoes and socks on     Time  8    Period  Weeks    Status  New    Target Date  12/13/17      PT LONG TERM GOAL #2   Title  Patient to demonstrate improved gait mechanics including improved hip rotation and mobility during gait, equal step/stance times, and upright posture in order to improve quality of motion and reduce pain     Time  8    Period  Weeks    Status  New      PT LONG TERM GOAL #3   Title  Patient to demonstrate correct functional biomechanics for tasks such as bed mobility, loading/unloading drier, and getting up into high car in order to prevent exacerbation of pain     Time  8    Period  Weeks    Status  New      PT LONG TERM GOAL #4   Title  Patient to be able to stand and ambulate without increase in pain for at least 60 minutes in order to improve QOL and assist in return to prior level of activity     Time  8    Period  Weeks    Status  New            Plan - 10/27/17 1408    Clinical Impression Statement  pt reported increased soreness today at 4/10. foucsed on manual techniques to calm donw muscle tightness of the glute med and piriformis combined with stretching. end of session she reported no increase in pain and declined modalites.     PT Next Visit Plan  review HEP, trial of heel lift for leg length  discrepancy. Work on lumbar and hip soft tissue limitaitons and flexibilty. Core nad proximal strength.     PT Home Exercise Plan  Eval: lumbar rotations, hip flexor stretch, 3D hip excursions , PPT , Marching , clam, piriformis stretching and adductor ball squeeze    Consulted and Agree with Plan of Care  Patient       Patient will benefit from skilled therapeutic intervention  in order to improve the following deficits and impairments:  Abnormal gait, Increased fascial restricitons, Improper body mechanics, Pain, Increased muscle spasms, Postural dysfunction, Decreased strength, Hypomobility, Decreased mobility, Decreased safety awareness, Difficulty walking, Impaired flexibility  Visit Diagnosis: Pain in left hip  Difficulty in walking, not elsewhere classified  Stiffness of left hip, not elsewhere classified  Muscle weakness (generalized)     Problem List Patient Active Problem List   Diagnosis Date Noted  . Prediabetes 05/26/2017  . Dysuria 11/03/2016  . Insomnia 11/03/2016  . Glaucoma 05/05/2016  . LPRD (laryngopharyngeal reflux disease) 01/26/2016  . Erosive esophagitis 11/27/2015  . Schatzki's ring of distal esophagus 11/27/2015  . Chronic sinusitis 10/16/2015  . Deviated nasal septum 10/16/2015  . Nasal polyp 10/16/2015  . Osteoporosis 08/15/2015  . Dysphagia, pharyngoesophageal phase 04/01/2015  . Endometrial polyp 10/24/2014  . Fatigue 07/09/2014  . Hyperlipidemia   . Breast cancer, right ([place]) 02/01/2014  . Malignant melanoma of arm ([place]) 11/23/2013  . Nasal septum ulceration 10/16/2013  . Seborrheic keratosis 10/16/2013  . Vitamin D deficiency 02/08/2013  . Sciatica of right side 01/24/2013  . Esophageal ulcer 01/24/2013  . Esophageal stenosis 01/24/2013  . Choroidal nevus of right eye 10/27/2012  . Impacted cerumen of right ear 10/11/2012  . Frequency 01/19/2012  . Gastroesophageal reflux disease 09/01/2011  . 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 H/O cataract extraction 04/30/2011  . Borderline glaucoma steroid responder 04/30/2011  . Obesity 10/16/2008  . Breast cancer of lower-outer quadrant of right female breast ([place]) 08/29/2008   Starr Lake PT, DPT, LAT, ATC  10/27/17  2:12 PM      [facility] [address] [place], [place], [address] Phone: [phone]   Fax:  [phone]  Name: [name] MRN: [id number] Date of Birth: [birth year]

## 2017-10-31 ENCOUNTER — Other Ambulatory Visit: Payer: Self-pay | Admitting: *Deleted

## 2017-10-31 DIAGNOSIS — M5431 Sciatica, right side: Secondary | ICD-10-CM

## 2017-10-31 MED ORDER — HYDROCODONE-ACETAMINOPHEN 10-325 MG PO TABS: ORAL_TABLET | ORAL | 0 refills | Status: DC

## 2017-10-31 NOTE — Telephone Encounter (Signed)
Patient called and requested Narcotic RF [place] Verified LR: 09/22/17 Pharmacy Confirmed Pended Rx and sent to Dr. Mariea Clonts for approval.

## 2017-11-01 ENCOUNTER — Encounter: Payer: Medicare Other | Admitting: Physical Therapy

## 2017-11-01 ENCOUNTER — Ambulatory Visit: Payer: Medicare Other | Admitting: Physical Therapy

## 2017-11-01 ENCOUNTER — Encounter: Payer: Self-pay | Admitting: Physical Therapy

## 2017-11-01 DIAGNOSIS — M25552 Pain in left hip: Secondary | ICD-10-CM | POA: Diagnosis not present

## 2017-11-01 DIAGNOSIS — M25652 Stiffness of left hip, not elsewhere classified: Secondary | ICD-10-CM | POA: Diagnosis not present

## 2017-11-01 DIAGNOSIS — R262 Difficulty in walking, not elsewhere classified: Secondary | ICD-10-CM

## 2017-11-01 DIAGNOSIS — M6281 Muscle weakness (generalized): Secondary | ICD-10-CM

## 2017-11-01 NOTE — Therapy (Signed)
[name], [place], [address] Phone: [phone]   Fax:  [phone]  Physical Therapy Treatment  Patient Details  Name: [name] MRN: [id number] Date of Birth: Oct 16, 1945 Referring Provider: Gayland Curry    Encounter Date: 11/01/2017  PT End of Session - 11/01/17 1743    Visit Number  5    Number of Visits  17    Date for PT Re-Evaluation  11/15/17    Authorization Type  Medicare/BCBS     PT Start Time  1547    PT Stop Time  1628    PT Time Calculation (min)  41 min    Activity Tolerance  Patient tolerated treatment well    Behavior During Therapy  [facility] for tasks assessed/performed       Past Medical History:  Diagnosis Date  . Asymptomatic varicose veins   . Cancer (HCC)    breast   . Diverticulosis   . DM type 2 (diabetes mellitus, type 2) ([place]) 10/23/2014  . Dysuria   . GERD (gastroesophageal reflux disease)   . Hiatal hernia   . Hyperlipidemia   . Malignant neoplasm of breast (female), unspecified site   . Migraine without aura, without mention of intractable migraine without mention of status migrainosus   . Other abnormal blood chemistry   . Pain in joint, pelvic region and thigh   . Shingles    in eyes  . Stricture and stenosis of esophagus   . Trigger finger (acquired)   . Unspecified cataract   . Unspecified glaucoma(365.9)     Past Surgical History:  Procedure Laterality Date  . APPENDECTOMY  1973   Dr. Linzie Collin  . BREAST LUMPECTOMY Right 08/29/2008   Dr. Erroll Luna  . ESOPHAGEAL DILATION    . NASAL POLYP EXCISION  10/2015  . POLYPECTOMY  11/20/14   Uterine Polyp Removed    There were no vitals filed for this visit.  Subjective Assessment - 11/01/17 1552    Subjective  " I felt pretty good after last session but I am feeling a little more sore today"     Patient Stated Goals  get rid of pain, solve problem and be normal again     Currently in Pain?  Yes    Pain  Score  2  last took medication for pain at 10:30 this morning.     Pain Location  Hip    Pain Orientation  Right                       [facility] Adult PT Treatment/Exercise - 11/01/17 1553      Self-Care   Self-Care  Other Self-Care Comments    Other Self-Care Comments   provided heel lift in the R shoe due to leg length discrepancy with the RLE appearing shorter compared bil      Lumbar Exercises: Stretches   Piriformis Stretch  2 reps;30 seconds      Knee/Hip Exercises: Aerobic   Nustep  L 5 x 7 min  UE/LE      Knee/Hip Exercises: Standing   Gait Training  4 x 30 ft walking heel strike/ toe off with heel lift in the RLE      Knee/Hip Exercises: Seated   Other Seated Knee/Hip Exercises  hip ER 2 x 10 with red theraband while seated on tennis ball for tack and stretch       Manual Therapy   Manual  therapy comments  MTPR along the piriformis combined with seated piriformis stretching             PT Education - 11/01/17 1742    Education provided  Yes    Education Details  Leg length discrepancy and benefits of heel lift and acclimation period.    Person(s) Educated  Patient    Methods  Explanation;Verbal cues    Comprehension  Verbalized understanding;Verbal cues required       PT Short Term Goals - 10/25/17 1505      PT SHORT TERM GOAL #1   Title  Patient to be independent with appopriate and progressive HEP, to be updated PRN     Status  On-going      PT SHORT TERM GOAL #2   Title  Patient to experience pain L hip as being no more than 2/10 at worst and symptoms peripheralizing no further than L greater trochanter in order to improve QOL and activity tolerance     Status  On-going      PT SHORT TERM GOAL #3   Title  Patient to demonstrate an improvement of at least 1 MMT in grade in all tested  muscle groups in order to assist in improving mobility and reducing pain     Status  On-going      PT SHORT TERM GOAL #4   Title  Patient to be able to  maintain correct upright posture at least 75% of the time without external cues in order to reduce pain and assist in addressing soft tissue and flexibility limitaitons     Status  On-going        PT Long Term Goals - 10/18/17 1450      PT LONG TERM GOAL #1   Title  Patient to demonstrate L hip as being no more than 30% limited in flexibility all planes in order to allow her to more easily perform functional tasks such as putting shoes and socks on     Time  8    Period  Weeks    Status  New    Target Date  12/13/17      PT LONG TERM GOAL #2   Title  Patient to demonstrate improved gait mechanics including improved hip rotation and mobility during gait, equal step/stance times, and upright posture in order to improve quality of motion and reduce pain     Time  8    Period  Weeks    Status  New      PT LONG TERM GOAL #3   Title  Patient to demonstrate correct functional biomechanics for tasks such as bed mobility, loading/unloading drier, and getting up into high car in order to prevent exacerbation of pain     Time  8    Period  Weeks    Status  New      PT LONG TERM GOAL #4   Title  Patient to be able to stand and ambulate without increase in pain for at least 60 minutes in order to improve QOL and assist in return to prior level of activity     Time  [place] - 11/01/17 1743    Clinical Impression Statement  [name] reports decreased pain in the L hip today at 2/10. continue soft tissue techinques in sitting using tennis ball to relieve piriformis tightness.  leg length assessment she exhibits shorter limb on the RLE, heel lift was provided which she reported relief of pain with walking/ standing.     PT Treatment/Interventions  ADLs/Self Care Home Management;Cryotherapy;Electrical Stimulation;Iontophoresis 4mg /ml Dexamethasone;Moist Heat;Traction;Ultrasound;Gait training;Stair training;Functional mobility training;Therapeutic  activities;Therapeutic exercise;Balance training;Neuromuscular re-education;Patient/family education    PT Next Visit Plan  review HEP, hows the Heel lift in the R shoe, Work on lumbar and hip soft tissue limitaitons and flexibilty. core strengthening    PT Home Exercise Plan  Eval: lumbar rotations, hip flexor stretch, 3D hip excursions , PPT , Marching , clam, piriformis stretching and adductor ball squeeze    Consulted and Agree with Plan of Care  Patient       Patient will benefit from skilled therapeutic intervention in order to improve the following deficits and impairments:  Abnormal gait, Increased fascial restricitons, Improper body mechanics, Pain, Increased muscle spasms, Postural dysfunction, Decreased strength, Hypomobility, Decreased mobility, Decreased safety awareness, Difficulty walking, Impaired flexibility  Visit Diagnosis: Pain in left hip  Difficulty in walking, not elsewhere classified  Stiffness of left hip, not elsewhere classified  Muscle weakness (generalized)     Problem List Patient Active Problem List   Diagnosis Date Noted  . Prediabetes 05/26/2017  . Dysuria 11/03/2016  . Insomnia 11/03/2016  . Glaucoma 05/05/2016  . LPRD (laryngopharyngeal reflux disease) 01/26/2016  . Erosive esophagitis 11/27/2015  . Schatzki's ring of distal esophagus 11/27/2015  . Chronic sinusitis 10/16/2015  . Deviated nasal septum 10/16/2015  . Nasal polyp 10/16/2015  . Osteoporosis 08/15/2015  . Dysphagia, pharyngoesophageal phase 04/01/2015  . Endometrial polyp 10/24/2014  . Fatigue 07/09/2014  . Hyperlipidemia   . Breast cancer, right ([place]) 02/01/2014  . Malignant melanoma of arm ([place]) 11/23/2013  . Nasal septum ulceration 10/16/2013  . Seborrheic keratosis 10/16/2013  . Vitamin D deficiency 02/08/2013  . Sciatica of right side 01/24/2013  . Esophageal ulcer 01/24/2013  . Esophageal stenosis 01/24/2013  . Choroidal nevus of right eye 10/27/2012  . Impacted  cerumen of right ear 10/11/2012  . Frequency 01/19/2012  . Gastroesophageal reflux disease 09/01/2011  . 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 H/O cataract extraction 04/30/2011  . Borderline glaucoma steroid responder 04/30/2011  . Obesity 10/16/2008  . Breast cancer of lower-outer quadrant of right female breast ([place]) 08/29/2008   Starr Lake PT, DPT, LAT, ATC  11/01/17  5:46 PM      [facility] [address] [place], [place], [address] Phone: [phone]   Fax:  [phone]  Name: AZELEA SEGUIN MRN: [id number] Date of Birth: Sep 06, 1945

## 2017-11-03 ENCOUNTER — Ambulatory Visit: Payer: Medicare Other | Admitting: Physical Therapy

## 2017-11-03 ENCOUNTER — Encounter: Payer: Self-pay | Admitting: Physical Therapy

## 2017-11-03 DIAGNOSIS — M6281 Muscle weakness (generalized): Secondary | ICD-10-CM

## 2017-11-03 DIAGNOSIS — M25552 Pain in left hip: Secondary | ICD-10-CM

## 2017-11-03 DIAGNOSIS — M25652 Stiffness of left hip, not elsewhere classified: Secondary | ICD-10-CM

## 2017-11-03 DIAGNOSIS — R262 Difficulty in walking, not elsewhere classified: Secondary | ICD-10-CM

## 2017-11-03 NOTE — Therapy (Signed)
[place], [place], [address] Phone: [phone]   Fax:  [phone]  Physical Therapy Treatment  Patient Details  Name: [name] MRN: [id number] Date of Birth: [birth year] Referring Provider: Gayland Curry    Encounter Date: 11/03/2017  PT End of Session - 11/03/17 1734    Visit Number  6    Number of Visits  17    Date for PT Re-Evaluation  11/15/17    PT Start Time  4709    PT Stop Time  1716    PT Time Calculation (min)  45 min    Activity Tolerance  Patient tolerated treatment well    Behavior During Therapy  [facility] for tasks assessed/performed       Past Medical History:  Diagnosis Date  . Asymptomatic varicose veins   . Cancer (HCC)    breast   . Diverticulosis   . DM type 2 (diabetes mellitus, type 2) ([place]) 10/23/2014  . Dysuria   . GERD (gastroesophageal reflux disease)   . Hiatal hernia   . Hyperlipidemia   . Malignant neoplasm of breast (female), unspecified site   . Migraine without aura, without mention of intractable migraine without mention of status migrainosus   . Other abnormal blood chemistry   . Pain in joint, pelvic region and thigh   . Shingles    in eyes  . Stricture and stenosis of esophagus   . Trigger finger (acquired)   . Unspecified cataract   . Unspecified glaucoma(365.9)     Past Surgical History:  Procedure Laterality Date  . APPENDECTOMY  1973   Dr. Linzie Collin  . BREAST LUMPECTOMY Right 08/29/2008   Dr. Erroll Luna  . ESOPHAGEAL DILATION    . NASAL POLYP EXCISION  10/2015  . POLYPECTOMY  11/20/14   Uterine Polyp Removed    There were no vitals filed for this visit.  Subjective Assessment - 11/03/17 1741    Subjective  I am a littile more sore today I am not sure why.  i have only soft surfaces to sit on so the tennis ball will not work.      Currently in Pain?  Yes    Pain Score  6     Pain Location  Hip    Pain Orientation  Right    Pain  Descriptors / Indicators  Aching    Pain Type  Chronic pain    Aggravating Factors   standing walking twisting,  getting in/ out a big truck    Pain Relieving Factors  meds                       [facility] Adult PT Treatment/Exercise - 11/03/17 0001      Lumbar Exercises: Stretches   Passive Hamstring Stretch  3 reps;30 seconds    Single Knee to Chest Stretch  3 reps;30 seconds    Hip Flexor Stretch  3 reps;30 seconds    Standing Side Bend  1 rep;20 seconds trunk to the right able to centralize pain to low back    Piriformis Stretch  3 reps;30 seconds 2 positions      Manual Therapy   Manual Therapy  Passive ROM    Manual therapy comments  trigger point release  soft tissue work lateral hip.  patient able to practice use of tennis balls against wall extra ball issued so she will be able to use at the wall  Passive ROM  left hip             PT Education - 11/03/17 1733    Education provided  Yes    Education Details  where to get heel lifts,  use of balls against wall for soft tissue work.     Methods  Explanation;Demonstration;Verbal cues;Handout printed from internet    Comprehension  Verbalized understanding;Returned demonstration       PT Short Term Goals - 10/25/17 1505      PT SHORT TERM GOAL #1   Title  Patient to be independent with appopriate and progressive HEP, to be updated PRN     Status  On-going      PT SHORT TERM GOAL #2   Title  Patient to experience pain L hip as being no more than 2/10 at worst and symptoms peripheralizing no further than L greater trochanter in order to improve QOL and activity tolerance     Status  On-going      PT SHORT TERM GOAL #3   Title  Patient to demonstrate an improvement of at least 1 MMT in grade in all tested  muscle groups in order to assist in improving mobility and reducing pain     Status  On-going      PT SHORT TERM GOAL #4   Title  Patient to be able to maintain correct upright posture at least 75%  of the time without external cues in order to reduce pain and assist in addressing soft tissue and flexibility limitaitons     Status  On-going        PT Long Term Goals - 10/18/17 1450      PT LONG TERM GOAL #1   Title  Patient to demonstrate L hip as being no more than 30% limited in flexibility all planes in order to allow her to more easily perform functional tasks such as putting shoes and socks on     Time  8    Period  Weeks    Status  New    Target Date  12/13/17      PT LONG TERM GOAL #2   Title  Patient to demonstrate improved gait mechanics including improved hip rotation and mobility during gait, equal step/stance times, and upright posture in order to improve quality of motion and reduce pain     Time  8    Period  Weeks    Status  New      PT LONG TERM GOAL #3   Title  Patient to demonstrate correct functional biomechanics for tasks such as bed mobility, loading/unloading drier, and getting up into high car in order to prevent exacerbation of pain     Time  8    Period  Weeks    Status  New      PT LONG TERM GOAL #4   Title  Patient to be able to stand and ambulate without increase in pain for at least 60 minutes in order to improve QOL and assist in return to prior level of activity     Time  8    Period  Weeks    Status  New            Plan - 11/03/17 1734    Clinical Impression Statement  Pain flare today patient not sure why.  pain increased at end of session with manual.  Tennis balls practiced against wall due to patient has no firm surfaces to sit on  at home.   Heel lifts were helpful.  She walks barefoot in her home  and really does not want to wear heel lift/ shoes inside. Side bend to right was able to centralize pain from mid gluteal to mid back.      PT Next Visit Plan    Work on lumbar and hip soft tissue limitaitons and flexibilty. core strengthening.  work toward goals.     PT Home Exercise Plan  Eval: lumbar rotations, hip flexor stretch, 3D  hip excursions , PPT , Marching , clam, piriformis stretching and adductor ball squeeze    Consulted and Agree with Plan of Care  Patient       Patient will benefit from skilled therapeutic intervention in order to improve the following deficits and impairments:     Visit Diagnosis: Pain in left hip  Difficulty in walking, not elsewhere classified  Stiffness of left hip, not elsewhere classified  Muscle weakness (generalized)     Problem List Patient Active Problem List   Diagnosis Date Noted  . Prediabetes 05/26/2017  . Dysuria 11/03/2016  . Insomnia 11/03/2016  . Glaucoma 05/05/2016  . LPRD (laryngopharyngeal reflux disease) 01/26/2016  . Erosive esophagitis 11/27/2015  . Schatzki's ring of distal esophagus 11/27/2015  . Chronic sinusitis 10/16/2015  . Deviated nasal septum 10/16/2015  . Nasal polyp 10/16/2015  . Osteoporosis 08/15/2015  . Dysphagia, pharyngoesophageal phase 04/01/2015  . Endometrial polyp 10/24/2014  . Fatigue 07/09/2014  . Hyperlipidemia   . Breast cancer, right ([place]) 02/01/2014  . Malignant melanoma of arm ([place]) 11/23/2013  . Nasal septum ulceration 10/16/2013  . Seborrheic keratosis 10/16/2013  . Vitamin D deficiency 02/08/2013  . Sciatica of right side 01/24/2013  . Esophageal ulcer 01/24/2013  . Esophageal stenosis 01/24/2013  . Choroidal nevus of right eye 10/27/2012  . Impacted cerumen of right ear 10/11/2012  . Frequency 01/19/2012  . Gastroesophageal reflux disease 09/01/2011  . 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 H/O cataract extraction 04/30/2011  . Borderline glaucoma steroid responder 04/30/2011  . Obesity 10/16/2008  . Breast cancer of lower-outer quadrant of right female breast ([place]) 08/29/2008    Jaequan Propes PTA 11/03/2017, 5:44 PM  [facility] [address] [place], [place], [address] Phone: [phone]    Fax:  [phone]  Name: [name] MRN: [id number] Date of Birth: Jun 09, 1946

## 2017-11-08 ENCOUNTER — Encounter: Payer: Self-pay | Admitting: Physical Therapy

## 2017-11-08 ENCOUNTER — Ambulatory Visit: Payer: Medicare Other | Admitting: Physical Therapy

## 2017-11-08 DIAGNOSIS — M25552 Pain in left hip: Secondary | ICD-10-CM | POA: Diagnosis not present

## 2017-11-08 DIAGNOSIS — M25652 Stiffness of left hip, not elsewhere classified: Secondary | ICD-10-CM | POA: Diagnosis not present

## 2017-11-08 DIAGNOSIS — M6281 Muscle weakness (generalized): Secondary | ICD-10-CM | POA: Diagnosis not present

## 2017-11-08 DIAGNOSIS — R262 Difficulty in walking, not elsewhere classified: Secondary | ICD-10-CM

## 2017-11-08 NOTE — Therapy (Signed)
[place], [place], [address] Phone: [phone]   Fax:  [phone]  Physical Therapy Treatment  Patient Details  Name: [name] MRN: [id number] Date of Birth: 11-Jun-1946 Referring Provider: Gayland Curry    Encounter Date: 11/08/2017  PT End of Session - 11/08/17 1819    Visit Number  7    Number of Visits  17    Date for PT Re-Evaluation  11/15/17    PT Start Time  1332    PT Stop Time  1445    PT Time Calculation (min)  73 min    Activity Tolerance  Patient tolerated treatment well    Behavior During Therapy  [facility] for tasks assessed/performed       Past Medical History:  Diagnosis Date  . Asymptomatic varicose veins   . Cancer (HCC)    breast   . Diverticulosis   . DM type 2 (diabetes mellitus, type 2) ([place]) 10/23/2014  . Dysuria   . GERD (gastroesophageal reflux disease)   . Hiatal hernia   . Hyperlipidemia   . Malignant neoplasm of breast (female), unspecified site   . Migraine without aura, without mention of intractable migraine without mention of status migrainosus   . Other abnormal blood chemistry   . Pain in joint, pelvic region and thigh   . Shingles    in eyes  . Stricture and stenosis of esophagus   . Trigger finger (acquired)   . Unspecified cataract   . Unspecified glaucoma(365.9)     Past Surgical History:  Procedure Laterality Date  . APPENDECTOMY  1973   Dr. Linzie Collin  . BREAST LUMPECTOMY Right 08/29/2008   Dr. Erroll Luna  . ESOPHAGEAL DILATION    . NASAL POLYP EXCISION  10/2015  . POLYPECTOMY  11/20/14   Uterine Polyp Removed    There were no vitals filed for this visit.  Subjective Assessment - 11/08/17 1339    Subjective  Sore in low back.  May be due to heel lift.     Currently in Pain?  Yes    Pain Score  4     Pain Location  Hip    Pain Orientation  Right    Pain Descriptors / Indicators  Aching    Pain Type  Chronic pain    Pain Frequency   Intermittent    Aggravating Factors   thinks it may be a heel lift    Pain Relieving Factors  meds    Multiple Pain Sites  No                       [facility] Adult PT Treatment/Exercise - 11/08/17 0001      Self-Care   Other Self-Care Comments   Some time spent on heel lift discussion about it may cause pain/ soreness due to changes.  Weaning on and off awhile  could be tried  she is ordering more online and size discussion about getting a small to fit in her othwr shoes.  She finally decided to continue with heel lift and then remove it a few hours for a easier adjustment.       Lumbar Exercises: Stretches   Passive Hamstring Stretch  30 seconds;3 reps sheet,  HEP    Double Knee to Chest Stretch  10 seconds legs on red ball   10 X    Hip Flexor Stretch  2 reps;20 seconds    Piriformis Stretch  2 reps;20 seconds    Other Lumbar Stretch Exercise  tilt with ball squeeze,  10 x coordination difficult      Lumbar Exercises: Supine   Pelvic Tilt  10 reps    Large Ball Abdominal Isometric  10 reps cued  technique and breathing    Large Ball Oblique Isometric  10 reps right / left  showed supine with ball and sit press to knee    Pelvic Tilt  5 reps      Knee/Hip Exercises: Aerobic   Nustep  L5 X 5 minutes UE/LE      Knee/Hip Exercises: Supine   Other Supine Knee/Hip Exercises  pelvic clock  12/6,  3/9,  2/7, 11/4  3 to 5 X each heavy cues initially             PT Education - 11/08/17 1816    Education provided  Yes    Education Details  HEP,  Heel lift weaning ,  gradually increase wearing time if you are getting sore,.    Person(s) Educated  Patient    Methods  Explanation;Tactile cues;Verbal cues;Handout    Comprehension  Verbalized understanding;Returned demonstration       PT Short Term Goals - 11/08/17 1824      PT SHORT TERM GOAL #1   Title  Patient to be independent with appopriate and progressive HEP, to be updated PRN     Baseline  able to do    Time   2    Period  Weeks    Status  Achieved      PT SHORT TERM GOAL #2   Title  Patient to experience pain L hip as being no more than 2/10 at worst and symptoms peripheralizing no further than L greater trochanter in order to improve QOL and activity tolerance     Baseline  pain 6/10 at times in hip    Time  4    Period  Weeks    Status  On-going      PT SHORT TERM GOAL #3   Title  Patient to demonstrate an improvement of at least 1 MMT in grade in all tested  muscle groups in order to assist in improving mobility and reducing pain     Time  4    Period  Weeks    Status  Unable to assess      PT SHORT TERM GOAL #4   Title  Patient to be able to maintain correct upright posture at least 75% of the time without external cues in order to reduce pain and assist in addressing soft tissue and flexibility limitaitons     Baseline  upright posture a challange in standing.  Able to do sitting with cues    Time  4    Period  Weeks    Status  On-going        PT Long Term Goals - 10/18/17 1450      PT LONG TERM GOAL #1   Title  Patient to demonstrate L hip as being no more than 30% limited in flexibility all planes in order to allow her to more easily perform functional tasks such as putting shoes and socks on     Time  8    Period  Weeks    Status  New    Target Date  12/13/17      PT LONG TERM GOAL #2   Title  Patient to demonstrate improved gait mechanics including improved hip  rotation and mobility during gait, equal step/stance times, and upright posture in order to improve quality of motion and reduce pain     Time  8    Period  Weeks    Status  New      PT LONG TERM GOAL #3   Title  Patient to demonstrate correct functional biomechanics for tasks such as bed mobility, loading/unloading drier, and getting up into high car in order to prevent exacerbation of pain     Time  8    Period  Weeks    Status  New      PT LONG TERM GOAL #4   Title  Patient to be able to stand and  ambulate without increase in pain for at least 60 minutes in order to improve QOL and assist in return to prior level of activity     Time  8    Period  Weeks    Status  New            Plan - 11/08/17 1820    Clinical Impression Statement  Soreness noted may be due in part to using heel lift all the time.  She was able to learn new ways to use lift to gradually increase wearing time.  Exercise focused on core strengthening and hip stretching.  Coordination breathing with exercise remains a challange for patient.  She declined the need for modalities post session.Patient wants to progress to silver sneakers post discharge.    PT Next Visit Plan    Work on lumbar and hip soft tissue limitaitons and flexibilty. core strengthening.  work toward goals. look at walking Consider home walking program.      PT Home Exercise Plan  Eval: lumbar rotations, hip flexor stretch, 3D hip excursions , PPT , Marching , clam, piriformis stretching and adductor ball squeeze    Consulted and Agree with Plan of Care  Patient       Patient will benefit from skilled therapeutic intervention in order to improve the following deficits and impairments:     Visit Diagnosis: Pain in left hip  Difficulty in walking, not elsewhere classified  Stiffness of left hip, not elsewhere classified  Muscle weakness (generalized)     Problem List Patient Active Problem List   Diagnosis Date Noted  . Prediabetes 05/26/2017  . Dysuria 11/03/2016  . Insomnia 11/03/2016  . Glaucoma 05/05/2016  . LPRD (laryngopharyngeal reflux disease) 01/26/2016  . Erosive esophagitis 11/27/2015  . Schatzki's ring of distal esophagus 11/27/2015  . Chronic sinusitis 10/16/2015  . Deviated nasal septum 10/16/2015  . Nasal polyp 10/16/2015  . Osteoporosis 08/15/2015  . Dysphagia, pharyngoesophageal phase 04/01/2015  . Endometrial polyp 10/24/2014  . Fatigue 07/09/2014  . Hyperlipidemia   . Breast cancer, right ([place]) 02/01/2014   . Malignant melanoma of arm ([place]) 11/23/2013  . Nasal septum ulceration 10/16/2013  . Seborrheic keratosis 10/16/2013  . Vitamin D deficiency 02/08/2013  . Sciatica of right side 01/24/2013  . Esophageal ulcer 01/24/2013  . Esophageal stenosis 01/24/2013  . Choroidal nevus of right eye 10/27/2012  . Impacted cerumen of right ear 10/11/2012  . Frequency 01/19/2012  . Gastroesophageal reflux disease 09/01/2011  . 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 H/O cataract extraction 04/30/2011  . Borderline glaucoma steroid responder 04/30/2011  . Obesity 10/16/2008  . Breast cancer of lower-outer quadrant of right female breast ([place]) 08/29/2008    Raini Tiley  PTA 11/08/2017, 6:26 PM  [place], [place], [address] Phone: [phone]   Fax:  [phone]  Name: [name] MRN: [id number] Date of Birth: 05/30/1946

## 2017-11-08 NOTE — Patient Instructions (Signed)
Hamstring: Towel Stretch (Supine)    Lie on back. Loop towel around left foot, hip and knee at 90. Straighten knee and pull foot toward body. Hold _30__ seconds. Relax. Repeat 3___ times. Do __1_ times a day. Repeat with other leg.  Hold 30 seconds    Copyright  VHI. All rights reserved.

## 2017-11-10 ENCOUNTER — Ambulatory Visit: Payer: Medicare Other | Admitting: Physical Therapy

## 2017-11-10 ENCOUNTER — Encounter: Payer: Self-pay | Admitting: Physical Therapy

## 2017-11-10 DIAGNOSIS — R262 Difficulty in walking, not elsewhere classified: Secondary | ICD-10-CM

## 2017-11-10 DIAGNOSIS — M25552 Pain in left hip: Secondary | ICD-10-CM | POA: Diagnosis not present

## 2017-11-10 DIAGNOSIS — M25652 Stiffness of left hip, not elsewhere classified: Secondary | ICD-10-CM

## 2017-11-10 DIAGNOSIS — M6281 Muscle weakness (generalized): Secondary | ICD-10-CM

## 2017-11-10 NOTE — Patient Instructions (Signed)

## 2017-11-10 NOTE — Therapy (Signed)
[place], [place], [address] Phone: [phone]   Fax:  [phone]  Physical Therapy Treatment  Patient Details  Name: [name] MRN: [id number] Date of Birth: April 15, 1946 Referring Provider: Gayland Curry    Encounter Date: 11/10/2017  PT End of Session - 11/10/17 1539    Visit Number  8    Number of Visits  17    Date for PT Re-Evaluation  11/15/17    PT Start Time  1331    PT Stop Time  1417    PT Time Calculation (min)  46 min    Activity Tolerance  Patient tolerated treatment well    Behavior During Therapy  [facility] for tasks assessed/performed       Past Medical History:  Diagnosis Date  . Asymptomatic varicose veins   . Cancer (HCC)    breast   . Diverticulosis   . DM type 2 (diabetes mellitus, type 2) ([place]) 10/23/2014  . Dysuria   . GERD (gastroesophageal reflux disease)   . Hiatal hernia   . Hyperlipidemia   . Malignant neoplasm of breast (female), unspecified site   . Migraine without aura, without mention of intractable migraine without mention of status migrainosus   . Other abnormal blood chemistry   . Pain in joint, pelvic region and thigh   . Shingles    in eyes  . Stricture and stenosis of esophagus   . Trigger finger (acquired)   . Unspecified cataract   . Unspecified glaucoma(365.9)     Past Surgical History:  Procedure Laterality Date  . APPENDECTOMY  1973   Dr. Linzie Collin  . BREAST LUMPECTOMY Right 08/29/2008   Dr. Erroll Luna  . ESOPHAGEAL DILATION    . NASAL POLYP EXCISION  10/2015  . POLYPECTOMY  11/20/14   Uterine Polyp Removed    There were no vitals filed for this visit.  Subjective Assessment - 11/10/17 1335    Subjective  Iwas getting pain.  I think it was the pillow support specially made between knees at night.    Pain is 2-3/10.  I tried to do the exercises    Currently in Pain?  Yes    Pain Score  3     Pain Location  Hip    Pain Orientation   Left    Pain Descriptors / Indicators  Aching    Pain Type  Chronic pain    Pain Radiating Towards    not today    Aggravating Factors   sleeping with special pillow    Pain Relieving Factors  meds         [facility] PT Assessment - 11/10/17 0001      Strength   Right Hip Flexion  4+/5    Left Hip Flexion  4/5    Left Hip ABduction  4/5                   [facility] Adult PT Treatment/Exercise - 11/10/17 0001      Therapeutic Activites    Therapeutic Activities  ADL's;Lifting    ADL's  simulated Cleaning vacume,  placing dished on shelf ,  prop foot under sink for dishes.  Some cues helpful  some increased pain like in/out of bed and foot under counter to place items overhead.      Lifting  ball knows how ,  does not always remember even when i am watching she will flex at the hips to pick  red ball off floor.       Lumbar Exercises: Stretches   Passive Hamstring Stretch  3 reps;30 seconds      Lumbar Exercises: Sidelying   Clam  5 reps limited hip motion,  unable when not able to compensate.    Hip Abduction  10 reps    Other Sidelying Lumbar Exercises  D1,  D2  PNF trunk mobility sidelying right AAROM,  AROM RROM in pain free range  Trunk showed increased mobility/ flexibility.      Knee/Hip Exercises: Aerobic   Nustep  L5 X 6 minutes UE/LE      Knee/Hip Exercises: Supine   Straight Leg Raises  10 reps cued low back neutral,  no pain      Manual Therapy   Manual therapy comments  soft tissue work left gluteal at end of session tissue softened,               PT Education - 11/10/17 1539    Education provided  Yes    Education Details  ADL,  lifting    Methods  Explanation;Demonstration;Verbal cues;Handout    Comprehension  Verbalized understanding;Returned demonstration;Need further instruction       PT Short Term Goals - 11/08/17 1824      PT SHORT TERM GOAL #1   Title  Patient to be independent with appopriate and progressive HEP, to be updated PRN      Baseline  able to do    Time  2    Period  Weeks    Status  Achieved      PT SHORT TERM GOAL #2   Title  Patient to experience pain L hip as being no more than 2/10 at worst and symptoms peripheralizing no further than L greater trochanter in order to improve QOL and activity tolerance     Baseline  pain 6/10 at times in hip    Time  4    Period  Weeks    Status  On-going      PT SHORT TERM GOAL #3   Title  Patient to demonstrate an improvement of at least 1 MMT in grade in all tested  muscle groups in order to assist in improving mobility and reducing pain     Time  4    Period  Weeks    Status  Unable to assess      PT SHORT TERM GOAL #4   Title  Patient to be able to maintain correct upright posture at least 75% of the time without external cues in order to reduce pain and assist in addressing soft tissue and flexibility limitaitons     Baseline  upright posture a challange in standing.  Able to do sitting with cues    Time  4    Period  Weeks    Status  On-going        PT Long Term Goals - 10/18/17 1450      PT LONG TERM GOAL #1   Title  Patient to demonstrate L hip as being no more than 30% limited in flexibility all planes in order to allow her to more easily perform functional tasks such as putting shoes and socks on     Time  8    Period  Weeks    Status  New    Target Date  12/13/17      PT LONG TERM GOAL #2   Title  Patient to demonstrate improved gait mechanics including improved  hip rotation and mobility during gait, equal step/stance times, and upright posture in order to improve quality of motion and reduce pain     Time  8    Period  Weeks    Status  New      PT LONG TERM GOAL #3   Title  Patient to demonstrate correct functional biomechanics for tasks such as bed mobility, loading/unloading drier, and getting up into high car in order to prevent exacerbation of pain     Time  8    Period  Weeks    Status  New      PT LONG TERM GOAL #4   Title  Patient  to be able to stand and ambulate without increase in pain for at least 60 minutes in order to improve QOL and assist in return to prior level of activity     Time  [place] - 11/10/17 1540    Clinical Impression Statement  Patient had som increased pain with practicing good body mechanics for ADL's.  Strength improved both hip flexors and left hip abduction,  4 to 4+/5, see flow sheet.  Pain not increased at end of session 3/10 Left upper gluteal. She is feel ing better today since she stopped sleeping with her new pillow between knees.    PT Next Visit Plan    Answer any ADL questions.  Work on lumbar and hip soft tissue limitaitons and flexibilty. core strengthening.  work toward goals. look at walking Consider home walking program.      PT Home Exercise Plan  Eval: lumbar rotations, hip flexor stretch, 3D hip excursions , PPT , Marching , clam, piriformis stretching and adductor ball squeeze    Consulted and Agree with Plan of Care  Patient       Patient will benefit from skilled therapeutic intervention in order to improve the following deficits and impairments:     Visit Diagnosis: Pain in left hip  Difficulty in walking, not elsewhere classified  Stiffness of left hip, not elsewhere classified  Muscle weakness (generalized)     Problem List Patient Active Problem List   Diagnosis Date Noted  . Prediabetes 05/26/2017  . Dysuria 11/03/2016  . Insomnia 11/03/2016  . Glaucoma 05/05/2016  . LPRD (laryngopharyngeal reflux disease) 01/26/2016  . Erosive esophagitis 11/27/2015  . Schatzki's ring of distal esophagus 11/27/2015  . Chronic sinusitis 10/16/2015  . Deviated nasal septum 10/16/2015  . Nasal polyp 10/16/2015  . Osteoporosis 08/15/2015  . Dysphagia, pharyngoesophageal phase 04/01/2015  . Endometrial polyp 10/24/2014  . Fatigue 07/09/2014  . Hyperlipidemia   . Breast cancer, right ([place]) 02/01/2014  . Malignant  melanoma of arm ([place]) 11/23/2013  . Nasal septum ulceration 10/16/2013  . Seborrheic keratosis 10/16/2013  . Vitamin D deficiency 02/08/2013  . Sciatica of right side 01/24/2013  . Esophageal ulcer 01/24/2013  . Esophageal stenosis 01/24/2013  . Choroidal nevus of right eye 10/27/2012  . Impacted cerumen of right ear 10/11/2012  . Frequency 01/19/2012  . Gastroesophageal reflux disease 09/01/2011  . 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 H/O cataract extraction 04/30/2011  . Borderline glaucoma steroid responder 04/30/2011  . Obesity 10/16/2008  . Breast cancer of lower-outer quadrant of right female breast ([place]) 08/29/2008    Kalup Jaquith PTA 11/10/2017, 3:45 PM  [facility]  [place], [place], [address] Phone: [phone]   Fax:  [phone]  Name: [name] MRN: [id number] Date of Birth: [birth year]

## 2017-11-15 ENCOUNTER — Ambulatory Visit: Payer: Medicare Other | Admitting: Physical Therapy

## 2017-11-15 ENCOUNTER — Encounter: Payer: Self-pay | Admitting: Physical Therapy

## 2017-11-15 DIAGNOSIS — M25552 Pain in left hip: Secondary | ICD-10-CM

## 2017-11-15 DIAGNOSIS — R262 Difficulty in walking, not elsewhere classified: Secondary | ICD-10-CM | POA: Diagnosis not present

## 2017-11-15 DIAGNOSIS — M25652 Stiffness of left hip, not elsewhere classified: Secondary | ICD-10-CM | POA: Diagnosis not present

## 2017-11-15 DIAGNOSIS — M6281 Muscle weakness (generalized): Secondary | ICD-10-CM | POA: Diagnosis not present

## 2017-11-15 NOTE — Therapy (Signed)
[name], [place], [address] Phone: [phone]   Fax:  [phone]  Physical Therapy Treatment / Re-evaluation  Patient Details  Name: [name] MRN: [id number] Date of Birth: 20-Dec-1945 Referring Provider: Gayland [name]    Encounter Date: 11/15/2017  PT End of Session - 11/15/17 1622    Visit Number  9    Number of Visits  17    Date for PT Re-Evaluation  12/13/17    PT Start Time  4210    PT Stop Time  1636    PT Time Calculation (min)  51 min    Activity Tolerance  Patient tolerated treatment well    Behavior During Therapy  [facility] for tasks assessed/performed       Past Medical History:  Diagnosis Date  . Asymptomatic varicose veins   . Cancer (HCC)    breast   . Diverticulosis   . DM type 2 (diabetes mellitus, type 2) ([place]) 10/23/2014  . Dysuria   . GERD (gastroesophageal reflux disease)   . Hiatal hernia   . Hyperlipidemia   . Malignant neoplasm of breast (female), unspecified site   . Migraine without aura, without mention of intractable migraine without mention of status migrainosus   . Other abnormal blood chemistry   . Pain in joint, pelvic region and thigh   . Shingles    in eyes  . Stricture and stenosis of esophagus   . Trigger finger (acquired)   . Unspecified cataract   . Unspecified glaucoma(365.9)     Past Surgical History:  Procedure Laterality Date  . APPENDECTOMY  1973   Dr. Linzie Collin  . BREAST LUMPECTOMY Right 08/29/2008   Dr. Erroll Luna  . ESOPHAGEAL DILATION    . NASAL POLYP EXCISION  10/2015  . POLYPECTOMY  11/20/14   Uterine Polyp Removed    There were no vitals filed for this visit.  Subjective Assessment - 11/15/17 1547    Subjective  "I still have a dull ache, but i am better since I started"    Currently in Pain?  Yes    Pain Score  3     Pain Location  Hip    Pain Orientation  Left    Pain Descriptors / Indicators  Aching;Dull    Pain Type   Chronic pain    Pain Onset  More than a month ago    Pain Frequency  Intermittent         [facility] PT Assessment - 11/15/17 1550      Strength   Left Hip Flexion  4/5    Left Knee Flexion  5/5    Left Knee Extension  5/5                   [facility] Adult PT Treatment/Exercise - 11/15/17 1614      Knee/Hip Exercises: Supine   Other Supine Knee/Hip Exercises  supine hip adductpoin 2 x 10 holding ea. 10 sec      Knee/Hip Exercises: Sidelying   Hip ABduction  2 sets;10 reps;Left;Strengthening      Modalities   Modalities  Moist Heat      Moist Heat Therapy   Number Minutes Moist Heat  10 Minutes      Manual Therapy   Manual Therapy  Soft tissue mobilization;Myofascial release    Manual therapy comments  skilled palpation and monitoring of pt throughout TPDN    Joint Mobilization  Gr 2-3 PA glides  to LT hip    Soft tissue mobilization  IASTM of the glute med       Trigger Point Dry Needling - 11/15/17 1613    Consent Given?  Yes    Education Handout Provided  Yes    Muscles Treated Lower Body  Gluteus minimus    Gluteus Minimus Response  Twitch response elicited;Palpable increased muscle length glute medius on the L           PT Education - 11/15/17 1621    Education provided  Yes    Education Details  muscle anatomy and referral patterns. What TPDN is, benefits of treatment and what to expect.     Person(s) Educated  Patient    Methods  Explanation;Verbal cues    Comprehension  Verbalized understanding;Verbal cues required       PT Short Term Goals - 11/15/17 1631      PT SHORT TERM GOAL #1   Title  Patient to be independent with appopriate and progressive HEP, to be updated PRN     Time  2    Period  Weeks    Status  Achieved      PT SHORT TERM GOAL #2   Title  Patient to experience pain L hip as being no more than 2/10 at worst and symptoms peripheralizing no further than L greater trochanter in order to improve QOL and activity tolerance      Time  4    Period  Weeks    Status  Partially Met      PT SHORT TERM GOAL #3   Title  Patient to demonstrate an improvement of at least 1 MMT in grade in all tested  muscle groups in order to assist in improving mobility and reducing pain     Time  4    Period  Weeks    Status  On-going      PT SHORT TERM GOAL #4   Title  Patient to be able to maintain correct upright posture at least 75% of the time without external cues in order to reduce pain and assist in addressing soft tissue and flexibility limitaitons     Time  4    Period  Weeks    Status  Partially Met        PT Long Term Goals - 11/15/17 1632      PT LONG TERM GOAL #1   Title  Patient to demonstrate L hip as being no more than 30% limited in flexibility all planes in order to allow her to more easily perform functional tasks such as putting shoes and socks on     Time  8    Period  Weeks    Status  On-going      PT LONG TERM GOAL #2   Title  Patient to demonstrate improved gait mechanics including improved hip rotation and mobility during gait, equal step/stance times, and upright posture in order to improve quality of motion and reduce pain     Time  8    Period  Weeks    Status  On-going      PT LONG TERM GOAL #3   Title  Patient to demonstrate correct functional biomechanics for tasks such as bed mobility, loading/unloading drier, and getting up into high car in order to prevent exacerbation of pain     Time  8    Period  Weeks    Status  On-going      PT  LONG TERM GOAL #4   Title  Patient to be able to stand and ambulate without increase in pain for at least 60 minutes in order to improve QOL and assist in return to prior level of activity     Time  [place] - 11/15/17 1628    Clinical Impression Statement  Mrs [name] continues to have pain in the lateral aspect of the L hip and in the low back located around the L PSIS. she is progress with strength and  additionally reports pain is decreasing. Educated and performed TPDN for the glute med followed with IASTM techniques. she was able to perform strengthening with reduced pain. utilized MHP end of session to reduce soreness from TPDN. she would benefit from continued physical therapy to decrease L hip/ lo wback pain, continue hip strengthening, maximize functional mobility and work toward remaining goals and independent exercise.     PT Frequency  2x / week    PT Duration  4 weeks    PT Treatment/Interventions  ADLs/Self Care Home Management;Cryotherapy;Electrical Stimulation;Iontophoresis '4mg'$ /ml Dexamethasone;Moist Heat;Traction;Ultrasound;Gait training;Stair training;Functional mobility training;Therapeutic activities;Therapeutic exercise;Balance training;Neuromuscular re-education;Patient/family education    PT Next Visit Plan    Answer any ADL questions.  Work on lumbar and hip soft tissue limitaitons and flexibilty. core strengthening/ hip abductor.  work toward goals. look at walking Consider home walking program.  How was DN    PT Home Exercise Plan  Eval: lumbar rotations, hip flexor stretch, 3D hip excursions , PPT , Marching , clam, piriformis stretching and adductor ball squeeze    Consulted and Agree with Plan of Care  Patient       Patient will benefit from skilled therapeutic intervention in order to improve the following deficits and impairments:  Abnormal gait, Increased fascial restricitons, Improper body mechanics, Pain, Increased muscle spasms, Postural dysfunction, Decreased strength, Hypomobility, Decreased mobility, Decreased safety awareness, Difficulty walking, Impaired flexibility  Visit Diagnosis: Pain in left hip  Difficulty in walking, not elsewhere classified  Stiffness of left hip, not elsewhere classified  Muscle weakness (generalized)     Problem List Patient Active Problem List   Diagnosis Date Noted  . Prediabetes 05/26/2017  . Dysuria 11/03/2016  .  Insomnia 11/03/2016  . Glaucoma 05/05/2016  . LPRD (laryngopharyngeal reflux disease) 01/26/2016  . Erosive esophagitis 11/27/2015  . Schatzki's ring of distal esophagus 11/27/2015  . Chronic sinusitis 10/16/2015  . Deviated nasal septum 10/16/2015  . Nasal polyp 10/16/2015  . Osteoporosis 08/15/2015  . Dysphagia, pharyngoesophageal phase 04/01/2015  . Endometrial polyp 10/24/2014  . Fatigue 07/09/2014  . Hyperlipidemia   . Breast cancer, right ([place]) 02/01/2014  . Malignant melanoma of arm ([place]) 11/23/2013  . Nasal septum ulceration 10/16/2013  . Seborrheic keratosis 10/16/2013  . Vitamin D deficiency 02/08/2013  . Sciatica of right side 01/24/2013  . Esophageal ulcer 01/24/2013  . Esophageal stenosis 01/24/2013  . Choroidal nevus of right eye 10/27/2012  . Impacted cerumen of right ear 10/11/2012  . Frequency 01/19/2012  . Gastroesophageal reflux disease 09/01/2011  . 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 H/O cataract extraction 04/30/2011  . Borderline glaucoma steroid responder 04/30/2011  . Obesity 10/16/2008  . Breast cancer of lower-outer quadrant of right female breast ([place]) 08/29/2008   Starr Lake PT, DPT, LAT, ATC  11/15/17  5:33 PM  [place], [place], [address] Phone: [phone]   Fax:  [phone]  Name: [name] MRN: [id number] Date of Birth: 05/30/1946

## 2017-11-17 ENCOUNTER — Ambulatory Visit: Payer: Medicare Other

## 2017-11-17 DIAGNOSIS — R262 Difficulty in walking, not elsewhere classified: Secondary | ICD-10-CM | POA: Diagnosis not present

## 2017-11-17 DIAGNOSIS — M25652 Stiffness of left hip, not elsewhere classified: Secondary | ICD-10-CM

## 2017-11-17 DIAGNOSIS — M6281 Muscle weakness (generalized): Secondary | ICD-10-CM

## 2017-11-17 DIAGNOSIS — M25552 Pain in left hip: Secondary | ICD-10-CM | POA: Diagnosis not present

## 2017-11-17 NOTE — Therapy (Addendum)
[place], [place], [address] Phone: [phone]   Fax:  [phone]  Physical Therapy Treatment Progress Note Reporting Period 10/18/17 to 11/17/17  See note below for Objective Data and Assessment of Progress/Goals.      Patient Details  Name: [name] MRN: [id number] Date of Birth: 1946-02-01 Referring Provider: Gayland Curry    Encounter Date: 11/17/2017  PT End of Session - 11/17/17 1340    Visit Number  10    Number of Visits  17    Date for PT Re-Evaluation  12/13/17    Authorization Type  Medicare/BCBS     Authorization Time Period  10/18/17 to 12/18/17   Kx at visit 15 and   Progress up date done  on 11/17/17   PT Start Time  0135    PT Stop Time  0215    PT Time Calculation (min)  40 min    Activity Tolerance  Patient tolerated treatment well    Behavior During Therapy  [facility] for tasks assessed/performed       Past Medical History:  Diagnosis Date  . Asymptomatic varicose veins   . Cancer (HCC)    breast   . Diverticulosis   . DM type 2 (diabetes mellitus, type 2) ([place]) 10/23/2014  . Dysuria   . GERD (gastroesophageal reflux disease)   . Hiatal hernia   . Hyperlipidemia   . Malignant neoplasm of breast (female), unspecified site   . Migraine without aura, without mention of intractable migraine without mention of status migrainosus   . Other abnormal blood chemistry   . Pain in joint, pelvic region and thigh   . Shingles    in eyes  . Stricture and stenosis of esophagus   . Trigger finger (acquired)   . Unspecified cataract   . Unspecified glaucoma(365.9)     Past Surgical History:  Procedure Laterality Date  . APPENDECTOMY  1973   Dr. Linzie Collin  . BREAST LUMPECTOMY Right 08/29/2008   Dr. Erroll Luna  . ESOPHAGEAL DILATION    . NASAL POLYP EXCISION  10/2015  . POLYPECTOMY  11/20/14   Uterine Polyp Removed    There were no vitals filed for this visit.  Subjective  Assessment - 11/17/17 1337    Subjective  LT knee hurts today.   DN was helpful . Yesterday  LT hip pain stopped.    The hip pain is slightly there now.       Currently in Pain?  Yes    Pain Score  2     Pain Location  Knee    Pain Orientation  Left    Pain Descriptors / Indicators  -- Feels like it is giving away when step on leg    Pain Type  Acute pain    Pain Onset  In the past 7 days    Pain Frequency  Intermittent    Aggravating Factors   walking and stepping down    Pain Relieving Factors  get off feet                       [facility] Adult PT Treatment/Exercise - 11/17/17 0001      Lumbar Exercises: Supine   Pelvic Tilt  10 reps    Bridge  10 reps 2 sets    Other Supine Lumbar Exercises  ball squeeze  with PPT x 12, then green band clam wth PPT x 2 sets x12  Knee/Hip Exercises: Aerobic   Nustep  L3 X 5 minutes LE    Stepper  --      Knee/Hip Exercises: Sidelying   Hip ABduction  Left    Clams  2x10 . She has trouble with this rollon pelvis back. , cued to keep range low      Manual Therapy   Soft tissue mobilization  to glutes and QL with gentle stretch    Passive ROM  flex/adduction, ER , IR 30-120 sec 2-3 reps also with leg pulls x 100 and hamstring stretch with and without adduction               PT Short Term Goals - 11/15/17 1631      PT SHORT TERM GOAL #1   Title  Patient to be independent with appopriate and progressive HEP, to be updated PRN     Time  2    Period  Weeks    Status  Achieved      PT SHORT TERM GOAL #2   Title  Patient to experience pain L hip as being no more than 2/10 at worst and symptoms peripheralizing no further than L greater trochanter in order to improve QOL and activity tolerance     Time  4    Period  Weeks    Status  Partially Met      PT SHORT TERM GOAL #3   Title  Patient to demonstrate an improvement of at least 1 MMT in grade in all tested  muscle groups in order to assist in improving mobility and  reducing pain     Time  4    Period  Weeks    Status  On-going      PT SHORT TERM GOAL #4   Title  Patient to be able to maintain correct upright posture at least 75% of the time without external cues in order to reduce pain and assist in addressing soft tissue and flexibility limitaitons     Time  4    Period  Weeks    Status  Partially Met        PT Long Term Goals - 11/15/17 1632      PT LONG TERM GOAL #1   Title  Patient to demonstrate L hip as being no more than 30% limited in flexibility all planes in order to allow her to more easily perform functional tasks such as putting shoes and socks on     Time  8    Period  Weeks    Status  On-going      PT LONG TERM GOAL #2   Title  Patient to demonstrate improved gait mechanics including improved hip rotation and mobility during gait, equal step/stance times, and upright posture in order to improve quality of motion and reduce pain     Time  8    Period  Weeks    Status  On-going      PT LONG TERM GOAL #3   Title  Patient to demonstrate correct functional biomechanics for tasks such as bed mobility, loading/unloading drier, and getting up into high car in order to prevent exacerbation of pain     Time  8    Period  Weeks    Status  On-going      PT LONG TERM GOAL #4   Title  Patient to be able to stand and ambulate without increase in pain for at least 60 minutes in order to improve QOL  and assist in return to prior level of activity     Time  8    Period  Weeks    Status  On-going            Plan - 11/17/17 1340    Clinical Impression Statement  Improved buttock pain with DN but now with Lt knee pain and feeling of instability/. Suggested she see MD if contnues.     PT Treatment/Interventions  ADLs/Self Care Home Management;Cryotherapy;Electrical Stimulation;Iontophoresis '4mg'$ /ml Dexamethasone;Moist Heat;Traction;Ultrasound;Gait training;Stair training;Functional mobility training;Therapeutic activities;Therapeutic  exercise;Balance training;Neuromuscular re-education;Patient/family education    PT Home Exercise Plan  Eval: lumbar rotations, hip flexor stretch, 3D hip excursions , PPT , Marching , clam, piriformis stretching and adductor ball squeeze    Consulted and Agree with Plan of Care  Patient       Patient will benefit from skilled therapeutic intervention in order to improve the following deficits and impairments:  Abnormal gait, Increased fascial restricitons, Improper body mechanics, Pain, Increased muscle spasms, Postural dysfunction, Decreased strength, Hypomobility, Decreased mobility, Decreased safety awareness, Difficulty walking, Impaired flexibility  Visit Diagnosis: Pain in left hip  Difficulty in walking, not elsewhere classified  Muscle weakness (generalized)  Stiffness of left hip, not elsewhere classified     Problem List Patient Active Problem List   Diagnosis Date Noted  . Prediabetes 05/26/2017  . Dysuria 11/03/2016  . Insomnia 11/03/2016  . Glaucoma 05/05/2016  . LPRD (laryngopharyngeal reflux disease) 01/26/2016  . Erosive esophagitis 11/27/2015  . Schatzki's ring of distal esophagus 11/27/2015  . Chronic sinusitis 10/16/2015  . Deviated nasal septum 10/16/2015  . Nasal polyp 10/16/2015  . Osteoporosis 08/15/2015  . Dysphagia, pharyngoesophageal phase 04/01/2015  . Endometrial polyp 10/24/2014  . Fatigue 07/09/2014  . Hyperlipidemia   . Breast cancer, right ([place]) 02/01/2014  . Malignant melanoma of arm ([place]) 11/23/2013  . Nasal septum ulceration 10/16/2013  . Seborrheic keratosis 10/16/2013  . Vitamin D deficiency 02/08/2013  . Sciatica of right side 01/24/2013  . Esophageal ulcer 01/24/2013  . Esophageal stenosis 01/24/2013  . Choroidal nevus of right eye 10/27/2012  . Impacted cerumen of right ear 10/11/2012  . Frequency 01/19/2012  . Gastroesophageal reflux disease 09/01/2011  . 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 Herpesviral  iridocyclitis 04/30/2011  . H/O cataract extraction 04/30/2011  . Borderline glaucoma steroid responder 04/30/2011  . Obesity 10/16/2008  . Breast cancer of lower-outer quadrant of right female breast ([place]) 08/29/2008    Darrel Hoover PT  11/17/2017, 2:13 PM  [facility] [address] [place], [place], [address] Phone: [phone]   Fax:  [phone]  Name: [name] MRN: [id number] Date of Birth: 11/03/1945

## 2017-11-22 ENCOUNTER — Encounter: Payer: Self-pay | Admitting: Physical Therapy

## 2017-11-22 ENCOUNTER — Ambulatory Visit: Payer: Medicare Other | Admitting: Physical Therapy

## 2017-11-22 DIAGNOSIS — M25652 Stiffness of left hip, not elsewhere classified: Secondary | ICD-10-CM | POA: Diagnosis not present

## 2017-11-22 DIAGNOSIS — M25552 Pain in left hip: Secondary | ICD-10-CM | POA: Diagnosis not present

## 2017-11-22 DIAGNOSIS — M6281 Muscle weakness (generalized): Secondary | ICD-10-CM | POA: Diagnosis not present

## 2017-11-22 DIAGNOSIS — R262 Difficulty in walking, not elsewhere classified: Secondary | ICD-10-CM

## 2017-11-22 NOTE — Patient Instructions (Signed)
Continue to stretch 

## 2017-11-22 NOTE — Therapy (Signed)
[place], [place], [address] Phone: [phone]   Fax:  [phone]  Physical Therapy Treatment  Patient Details  Name: [name] MRN: [id number] Date of Birth: 1946-01-23 Referring Provider: Gayland Curry    Encounter Date: 11/22/2017  PT End of Session - 11/22/17 1421    Visit Number  11    Number of Visits  17    Date for PT Re-Evaluation  12/13/17    PT Start Time  5809    PT Stop Time  1425    PT Time Calculation (min)  50 min    Activity Tolerance  Patient tolerated treatment well    Behavior During Therapy  [facility] for tasks assessed/performed       Past Medical History:  Diagnosis Date  . Asymptomatic varicose veins   . Cancer (HCC)    breast   . Diverticulosis   . DM type 2 (diabetes mellitus, type 2) ([place]) 10/23/2014  . Dysuria   . GERD (gastroesophageal reflux disease)   . Hiatal hernia   . Hyperlipidemia   . Malignant neoplasm of breast (female), unspecified site   . Migraine without aura, without mention of intractable migraine without mention of status migrainosus   . Other abnormal blood chemistry   . Pain in joint, pelvic region and thigh   . Shingles    in eyes  . Stricture and stenosis of esophagus   . Trigger finger (acquired)   . Unspecified cataract   . Unspecified glaucoma(365.9)     Past Surgical History:  Procedure Laterality Date  . APPENDECTOMY  1973   Dr. Linzie Collin  . BREAST LUMPECTOMY Right 08/29/2008   Dr. Erroll Luna  . ESOPHAGEAL DILATION    . NASAL POLYP EXCISION  10/2015  . POLYPECTOMY  11/20/14   Uterine Polyp Removed    There were no vitals filed for this visit.  Subjective Assessment - 11/22/17 1341    Subjective  Pain flare  from hip mobs last visit pain has returned .   I have had a bad 4 days.      Currently in Pain?  Yes    Pain Score  5     Pain Location  Back    Pain Orientation  Lower;Left    Pain Descriptors / Indicators  Aching  generalized    Pain Radiating Towards  into left leg    Aggravating Factors   weightbearing at times.      Pain Relieving Factors  hydrocodone does not help.    2 pillows to sit on.      Effect of Pain on Daily Activities  limits walking sleeping                       [facility] Adult PT Treatment/Exercise - 11/22/17 0001      Moist Heat Therapy   Number Minutes Moist Heat  10 Minutes resting in hip stretch position on side hip flexed    Moist Heat Location  Hip use with permission from [place]  Soft tissue mobilization    Soft tissue mobilization  instrument assisst   trigger point release and myofascial release to left hip tissue softened a little.  so asked Carlus Pavlov to DN.       Trigger Point Dry Needling - 11/22/17 1408    Consent Given?  Yes Peformed by Certified DN  PT Carlus Pavlov    Education Handout Provided  -- given previously    Muscles Treated Lower Body  Piriformis all DN today peformed by certified PT Carlus Pavlov    Gluteus Minimus Response  Twitch response elicited;Palpable increased muscle length glut medius    Piriformis Response  Twitch response elicited;Palpable increased muscle length           PT Education - 11/22/17 1421    Education provided  Yes    Education Details  Post DN stretching    Person(s) Educated  Patient    Methods  Explanation    Comprehension  Verbalized understanding       PT Short Term Goals - 11/22/17 1424      PT SHORT TERM GOAL #1   Title  Patient to be independent with appopriate and progressive HEP, to be updated PRN     Baseline  able to do    Time  2    Period  Weeks    Status  Achieved      PT SHORT TERM GOAL #2   Title  Patient to experience pain L hip as being no more than 2/10 at worst and symptoms peripheralizing no further than L greater trochanter in order to improve QOL and activity tolerance     Baseline  pain up to 5/10 in hip over weekend    Time  4     Period  Weeks    Status  Partially Met      PT SHORT TERM GOAL #3   Title  Patient to demonstrate an improvement of at least 1 MMT in grade in all tested  muscle groups in order to assist in improving mobility and reducing pain     Time  4    Period  Weeks    Status  Unable to assess      PT SHORT TERM GOAL #4   Title  Patient to be able to maintain correct upright posture at least 75% of the time without external cues in order to reduce pain and assist in addressing soft tissue and flexibility limitaitons     Baseline  upright posture a challange when she is in pain.  Able to do without cues when pain reduced. Standing    Time  4    Period  Weeks    Status  Partially Met        PT Long Term Goals - 11/15/17 1632      PT LONG TERM GOAL #1   Title  Patient to demonstrate L hip as being no more than 30% limited in flexibility all planes in order to allow her to more easily perform functional tasks such as putting shoes and socks on     Time  8    Period  Weeks    Status  On-going      PT LONG TERM GOAL #2   Title  Patient to demonstrate improved gait mechanics including improved hip rotation and mobility during gait, equal step/stance times, and upright posture in order to improve quality of motion and reduce pain     Time  8    Period  Weeks    Status  On-going      PT LONG TERM GOAL #3   Title  Patient to demonstrate correct functional biomechanics for tasks such as bed mobility, loading/unloading drier, and getting up into high car in order to prevent exacerbation of pain     Time  8  Period  Weeks    Status  On-going      PT LONG TERM GOAL #4   Title  Patient to be able to stand and ambulate without increase in pain for at least 60 minutes in order to improve QOL and assist in return to prior level of activity     Time  8    Period  Weeks    Status  On-going            Plan - 11/22/17 1422    Clinical Impression Statement  Pain flare since last visit.   Patient walking with less guarding and noted less pain post session today  4/10.  Posture greatly improved after session..    PT Next Visit Plan  Assess DN.  answer any ADL questions.  review HEP and progress as able.     PT Home Exercise Plan  Eval: lumbar rotations, hip flexor stretch, 3D hip excursions , PPT , Marching , clam, piriformis stretching and adductor ball squeeze    Consulted and Agree with Plan of Care  --       Patient will benefit from skilled therapeutic intervention in order to improve the following deficits and impairments:     Visit Diagnosis: Difficulty in walking, not elsewhere classified  Pain in left hip  Muscle weakness (generalized)  Stiffness of left hip, not elsewhere classified     Problem List Patient Active Problem List   Diagnosis Date Noted  . Prediabetes 05/26/2017  . Dysuria 11/03/2016  . Insomnia 11/03/2016  . Glaucoma 05/05/2016  . LPRD (laryngopharyngeal reflux disease) 01/26/2016  . Erosive esophagitis 11/27/2015  . Schatzki's ring of distal esophagus 11/27/2015  . Chronic sinusitis 10/16/2015  . Deviated nasal septum 10/16/2015  . Nasal polyp 10/16/2015  . Osteoporosis 08/15/2015  . Dysphagia, pharyngoesophageal phase 04/01/2015  . Endometrial polyp 10/24/2014  . Fatigue 07/09/2014  . Hyperlipidemia   . Breast cancer, right ([place]) 02/01/2014  . Malignant melanoma of arm ([place]) 11/23/2013  . Nasal septum ulceration 10/16/2013  . Seborrheic keratosis 10/16/2013  . Vitamin D deficiency 02/08/2013  . Sciatica of right side 01/24/2013  . Esophageal ulcer 01/24/2013  . Esophageal stenosis 01/24/2013  . Choroidal nevus of right eye 10/27/2012  . Impacted cerumen of right ear 10/11/2012  . Frequency 01/19/2012  . Gastroesophageal reflux disease 09/01/2011  . 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 H/O cataract extraction 04/30/2011  . Borderline glaucoma steroid responder  04/30/2011  . Obesity 10/16/2008  . Breast cancer of lower-outer quadrant of right female breast ([place]) 08/29/2008    Corri Delapaz   PTA 11/22/2017, 2:36 PM  [facility] [address] [place], [place], [address] Phone: [phone]   Fax:  [phone]  Name: [name] MRN: [id number] Date of Birth: [birth year]

## 2017-11-24 ENCOUNTER — Ambulatory Visit: Payer: Medicare Other | Attending: Internal Medicine | Admitting: Physical Therapy

## 2017-11-24 ENCOUNTER — Encounter: Payer: Self-pay | Admitting: Physical Therapy

## 2017-11-24 DIAGNOSIS — M25652 Stiffness of left hip, not elsewhere classified: Secondary | ICD-10-CM | POA: Diagnosis not present

## 2017-11-24 DIAGNOSIS — R262 Difficulty in walking, not elsewhere classified: Secondary | ICD-10-CM | POA: Diagnosis not present

## 2017-11-24 DIAGNOSIS — M25552 Pain in left hip: Secondary | ICD-10-CM | POA: Diagnosis not present

## 2017-11-24 DIAGNOSIS — M6281 Muscle weakness (generalized): Secondary | ICD-10-CM | POA: Diagnosis not present

## 2017-11-24 NOTE — Therapy (Signed)
[place], [place], [address] Phone: [phone]   Fax:  [phone]  Physical Therapy Treatment  Patient Details  Name: [name] MRN: [id number] Date of Birth: Dec 09, 1945 Referring Provider: Gayland Curry    Encounter Date: 11/24/2017  PT End of Session - 11/24/17 1742    Visit Number  12    Number of Visits  17    Date for PT Re-Evaluation  12/13/17    PT Start Time  1332    PT Stop Time  1415    PT Time Calculation (min)  43 min    Activity Tolerance  Patient tolerated treatment well    Behavior During Therapy  [facility] for tasks assessed/performed       Past Medical History:  Diagnosis Date  . Asymptomatic varicose veins   . Cancer (HCC)    breast   . Diverticulosis   . DM type 2 (diabetes mellitus, type 2) ([place]) 10/23/2014  . Dysuria   . GERD (gastroesophageal reflux disease)   . Hiatal hernia   . Hyperlipidemia   . Malignant neoplasm of breast (female), unspecified site   . Migraine without aura, without mention of intractable migraine without mention of status migrainosus   . Other abnormal blood chemistry   . Pain in joint, pelvic region and thigh   . Shingles    in eyes  . Stricture and stenosis of esophagus   . Trigger finger (acquired)   . Unspecified cataract   . Unspecified glaucoma(365.9)     Past Surgical History:  Procedure Laterality Date  . APPENDECTOMY  1973   Dr. Linzie Collin  . BREAST LUMPECTOMY Right 08/29/2008   Dr. Erroll Luna  . ESOPHAGEAL DILATION    . NASAL POLYP EXCISION  10/2015  . POLYPECTOMY  11/20/14   Uterine Polyp Removed    There were no vitals filed for this visit.  Subjective Assessment - 11/24/17 1334    Subjective  DN helpful.  Has pain distal gluteal. I was thinking massage would help.  Able to sleep on Lt side last night.      Currently in Pain?  Yes    Pain Score  3  was 5/10 tihs morning.      Pain Orientation  Lower    Pain Descriptors /  Indicators  Aching    Pain Type  Acute pain    Pain Radiating Towards  posterior hip    Pain Frequency  Intermittent    Aggravating Factors   weightbearing    Pain Relieving Factors  hot bath    Effect of Pain on Daily Activities  limits walking    Multiple Pain Sites  No                       [facility] Adult PT Treatment/Exercise - 11/24/17 0001      Ambulation/Gait   Gait Comments  585+ feet   SPC no loss of balance or rest with 1/10 pain  left hip.       Lumbar Exercises: Stretches   Passive Hamstring Stretch  3 reps;30 seconds    Hip Flexor Stretch  3 reps;30 seconds off edge of ant and in sidelying.  decreased ROM noted    Quad Stretch  3 reps;30 seconds limited arOM  stretched off edge of mat,      Lumbar Exercises: Supine   Ab Set  10 reps fatigues,  cues    Pelvic Tilt  1 rep    Pelvic Tilt Limitations  cued for technique    Other Supine Lumbar Exercises  ball squeeze with breathing and tilt pain in back when she lost abdominal control      Knee/Hip Exercises: Supine   Patellar Mobs  left    Other Supine Knee/Hip Exercises  single clam pop right side single,  hard to hold abdominals.        Manual Therapy   Manual Therapy  Soft tissue mobilization tolerated light pressure    Manual therapy comments  neuromuscular trigger point release,  myofascial release left hip,  tissue softened however trigger points remain lateral hip.    Soft tissue mobilization  instrument assist    Passive ROM  hip extension/quad stretch in sidelying  This feels good             PT Education - 11/24/17 1501    Education provided  No    Education Details  --    Forensic psychologist) Educated  --    Methods  --    Comprehension  --       PT Short Term Goals - 11/22/17 1424      PT SHORT TERM GOAL #1   Title  Patient to be independent with appopriate and progressive HEP, to be updated PRN     Baseline  able to do    Time  2    Period  Weeks    Status  Achieved      PT SHORT  TERM GOAL #2   Title  Patient to experience pain L hip as being no more than 2/10 at worst and symptoms peripheralizing no further than L greater trochanter in order to improve QOL and activity tolerance     Baseline  pain up to 5/10 in hip over weekend    Time  4    Period  Weeks    Status  Partially Met      PT SHORT TERM GOAL #3   Title  Patient to demonstrate an improvement of at least 1 MMT in grade in all tested  muscle groups in order to assist in improving mobility and reducing pain     Time  4    Period  Weeks    Status  Unable to assess      PT SHORT TERM GOAL #4   Title  Patient to be able to maintain correct upright posture at least 75% of the time without external cues in order to reduce pain and assist in addressing soft tissue and flexibility limitaitons     Baseline  upright posture a challange when she is in pain.  Able to do without cues when pain reduced. Standing    Time  4    Period  Weeks    Status  Partially Met        PT Long Term Goals - 11/15/17 1632      PT LONG TERM GOAL #1   Title  Patient to demonstrate L hip as being no more than 30% limited in flexibility all planes in order to allow her to more easily perform functional tasks such as putting shoes and socks on     Time  8    Period  Weeks    Status  On-going      PT LONG TERM GOAL #2   Title  Patient to demonstrate improved gait mechanics including improved hip rotation and mobility during gait, equal step/stance times, and upright posture  in order to improve quality of motion and reduce pain     Time  8    Period  Weeks    Status  On-going      PT LONG TERM GOAL #3   Title  Patient to demonstrate correct functional biomechanics for tasks such as bed mobility, loading/unloading drier, and getting up into high car in order to prevent exacerbation of pain     Time  8    Period  Weeks    Status  On-going      PT LONG TERM GOAL #4   Title  Patient to be able to stand and ambulate without  increase in pain for at least 60 minutes in order to improve QOL and assist in return to prior level of activity     Time  8    Period  Weeks    Status  On-going            Plan - 11/24/17 1742    Clinical Impression Statement  Pain present last session improved. DN helpful. Position of pain today lateral hip.    Patient was able to exercise today however her abdominals fatigue quickly.  She may benifit from sitting or standing stabilization.      PT Next Visit Plan  Try to wean from manual.  consider sitting or standing stabilization.  Review HEP and check compliance.    PT Home Exercise Plan  Eval: lumbar rotations, hip flexor stretch, 3D hip excursions , PPT , Marching , clam, piriformis stretching and adductor ball squeeze    Consulted and Agree with Plan of Care  Patient       Patient will benefit from skilled therapeutic intervention in order to improve the following deficits and impairments:     Visit Diagnosis: Difficulty in walking, not elsewhere classified  Pain in left hip  Muscle weakness (generalized)  Stiffness of left hip, not elsewhere classified     Problem List Patient Active Problem List   Diagnosis Date Noted  . Prediabetes 05/26/2017  . Dysuria 11/03/2016  . Insomnia 11/03/2016  . Glaucoma 05/05/2016  . LPRD (laryngopharyngeal reflux disease) 01/26/2016  . Erosive esophagitis 11/27/2015  . Schatzki's ring of distal esophagus 11/27/2015  . Chronic sinusitis 10/16/2015  . Deviated nasal septum 10/16/2015  . Nasal polyp 10/16/2015  . Osteoporosis 08/15/2015  . Dysphagia, pharyngoesophageal phase 04/01/2015  . Endometrial polyp 10/24/2014  . Fatigue 07/09/2014  . Hyperlipidemia   . Breast cancer, right ([place]) 02/01/2014  . Malignant melanoma of arm ([place]) 11/23/2013  . Nasal septum ulceration 10/16/2013  . Seborrheic keratosis 10/16/2013  . Vitamin D deficiency 02/08/2013  . Sciatica of right side 01/24/2013  . Esophageal ulcer 01/24/2013  .  Esophageal stenosis 01/24/2013  . Choroidal nevus of right eye 10/27/2012  . Impacted cerumen of right ear 10/11/2012  . Frequency 01/19/2012  . Gastroesophageal reflux disease 09/01/2011  . 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 H/O cataract extraction 04/30/2011  . Borderline glaucoma steroid responder 04/30/2011  . Obesity 10/16/2008  . Breast cancer of lower-outer quadrant of right female breast ([place]) 08/29/2008    Armiyah Capron PTA 11/24/2017, 5:47 PM  [place] [facility] [address] [place], [place], [address] Phone: [phone]   Fax:  [phone]  Name: GERARDINE PELTZ MRN: [id number] Date of Birth: [birth year]

## 2017-11-28 ENCOUNTER — Ambulatory Visit: Payer: Medicare Other | Admitting: Physical Therapy

## 2017-11-28 ENCOUNTER — Encounter: Payer: Self-pay | Admitting: Physical Therapy

## 2017-11-28 DIAGNOSIS — M6281 Muscle weakness (generalized): Secondary | ICD-10-CM

## 2017-11-28 DIAGNOSIS — R262 Difficulty in walking, not elsewhere classified: Secondary | ICD-10-CM | POA: Diagnosis not present

## 2017-11-28 DIAGNOSIS — M25552 Pain in left hip: Secondary | ICD-10-CM | POA: Diagnosis not present

## 2017-11-28 DIAGNOSIS — M25652 Stiffness of left hip, not elsewhere classified: Secondary | ICD-10-CM | POA: Diagnosis not present

## 2017-11-28 NOTE — Therapy (Signed)
[place], [place], [address] Phone: [phone]   Fax:  [phone]  Physical Therapy Treatment  Patient Details  Name: [name] MRN: [id number] Date of Birth: [birth year] Referring Provider: Gayland Curry    Encounter Date: 11/28/2017  PT End of Session - 11/28/17 1450    Visit Number  13    Number of Visits  17    Date for PT Re-Evaluation  12/13/17    PT Start Time  1330    PT Stop Time  1417    PT Time Calculation (min)  47 min    Activity Tolerance  Patient tolerated treatment well    Behavior During Therapy  [facility] for tasks assessed/performed       Past Medical History:  Diagnosis Date  . Asymptomatic varicose veins   . Cancer (HCC)    breast   . Diverticulosis   . DM type 2 (diabetes mellitus, type 2) ([facility]) 10/23/2014  . Dysuria   . GERD (gastroesophageal reflux disease)   . Hiatal hernia   . Hyperlipidemia   . Malignant neoplasm of breast (female), unspecified site   . Migraine without aura, without mention of intractable migraine without mention of status migrainosus   . Other abnormal blood chemistry   . Pain in joint, pelvic region and thigh   . Shingles    in eyes  . Stricture and stenosis of esophagus   . Trigger finger (acquired)   . Unspecified cataract   . Unspecified glaucoma(365.9)     Past Surgical History:  Procedure Laterality Date  . APPENDECTOMY  1973   Dr. Linzie Collin  . BREAST LUMPECTOMY Right 08/29/2008   Dr. Erroll Luna  . ESOPHAGEAL DILATION    . NASAL POLYP EXCISION  10/2015  . POLYPECTOMY  11/20/14   Uterine Polyp Removed    There were no vitals filed for this visit.  Subjective Assessment - 11/28/17 1338    Subjective  Sit muscle   sore.  I want to work on the spot  that hurts.  I probably work on exercises 20 to 30 minutes a day. I let my leg hang off the bed   to stretch the hip flexer.      Currently in Pain?  Yes    Pain Score  3     Pain  Location  Back    Pain Orientation  Lower    Pain Descriptors / Indicators  Aching    Pain Radiating Towards  Mid gluteal area in left hip  and into left calf.      Pain Frequency  Intermittent    Aggravating Factors   woke up with it   about 6 am    Pain Relieving Factors  DN  sleeping with hip on stretch    Effect of Pain on Daily Activities  limits                       [facility] Adult PT Treatment/Exercise - 11/28/17 0001      Ambulation/Gait   Gait Comments  --      Self-Care   Other Self-Care Comments   reviewed, practiced picking things up with proper form   vs forward trunk lean .  practiced with a cane aling spine.  Painflu in low back with this.       Lumbar Exercises: Stretches   Passive Hamstring Stretch  3 reps;30 seconds    Hip Flexor Stretch  3 reps;30 seconds off edge of ant and in sidelying.   Cued to not do at home      Lumbar Exercises: Supine   Ab Set  10 reps    Pelvic Tilt Limitations  fatigues      Knee/Hip Exercises: Stretches   Other Knee/Hip Stretches  hip ER stretch  prone over 2 pillows,,  2 sets 20 seconds,       Knee/Hip Exercises: Aerobic   Nustep  L4 6.5 minutes      Knee/Hip Exercises: Prone   Other Prone Exercises  gluteal sets       Manual Therapy   Manual Therapy  Soft tissue mobilization    Manual therapy comments  neuromuscular trigger point release,  myofascial release left hip,  tissue softened     Soft tissue mobilization  instrument assist    Passive ROM  Hip IR  prone,  antewreior hip stretches             PT Education - 11/28/17 1448    Education provided  Yes    Education Details  stretch prone over pillows  vs stretch off the edge of bed.  lifting practice    Person(s) Educated  Patient    Methods  Explanation    Comprehension  Verbalized understanding;Returned demonstration       PT Short Term Goals - 11/28/17 1616      PT SHORT TERM GOAL #1   Title  Patient to be independent with appopriate and  progressive HEP, to be updated PRN     Time  2    Period  Weeks    Status  Achieved      PT SHORT TERM GOAL #2   Title  Patient to experience pain L hip as being no more than 2/10 at worst and symptoms peripheralizing no further than L greater trochanter in order to improve QOL and activity tolerance     Baseline  up to 5/10    Time  4    Period  Weeks    Status  Partially Met      PT SHORT TERM GOAL #3   Title  Patient to demonstrate an improvement of at least 1 MMT in grade in all tested  muscle groups in order to assist in improving mobility and reducing pain     Time  4    Period  Weeks    Status  Unable to assess      PT SHORT TERM GOAL #4   Title  Patient to be able to maintain correct upright posture at least 75% of the time without external cues in order to reduce pain and assist in addressing soft tissue and flexibility limitaitons     Baseline  this continues to be a challange when she has pain, this makes her feel better.    Time  4    Period  Weeks    Status  On-going        PT Long Term Goals - 11/15/17 1632      PT LONG TERM GOAL #1   Title  Patient to demonstrate L hip as being no more than 30% limited in flexibility all planes in order to allow her to more easily perform functional tasks such as putting shoes and socks on     Time  8    Period  Weeks    Status  On-going      PT LONG TERM GOAL #2   Title  Patient to demonstrate improved gait mechanics including improved hip rotation and mobility during gait, equal step/stance times, and upright posture in order to improve quality of motion and reduce pain     Time  8    Period  Weeks    Status  On-going      PT LONG TERM GOAL #3   Title  Patient to demonstrate correct functional biomechanics for tasks such as bed mobility, loading/unloading drier, and getting up into high car in order to prevent exacerbation of pain     Time  8    Period  Weeks    Status  On-going      PT LONG TERM GOAL #4   Title   Patient to be able to stand and ambulate without increase in pain for at least 60 minutes in order to improve QOL and assist in return to prior level of activity     Time  8    Period  Weeks    Status  On-going            Plan - 11/28/17 1453    Clinical Impression Statement  Pain mid gluteal returned.  it may have increased with excessive stretching hip flexor off edge of mat with pain over the weekend.    Pain decreased with manual.  She needs work with pelvic trunk dissasociation,.  She was able to list 5 + ways to use good posture at home and even has taught her friends.    PT Next Visit Plan  Try to wean from manual.  consider sitting or standing stabilization.  Review HEP and pelvic disasocoiation,  MMT for STG's    PT Home Exercise Plan  Eval: lumbar rotations, hip flexor stretch, 3D hip excursions , PPT , Marching , clam, piriformis stretching and adductor ball squeeze    Consulted and Agree with Plan of Care  Patient       Patient will benefit from skilled therapeutic intervention in order to improve the following deficits and impairments:     Visit Diagnosis: Difficulty in walking, not elsewhere classified  Pain in left hip  Muscle weakness (generalized)  Stiffness of left hip, not elsewhere classified     Problem List Patient Active Problem List   Diagnosis Date Noted  . Prediabetes 05/26/2017  . Dysuria 11/03/2016  . Insomnia 11/03/2016  . Glaucoma 05/05/2016  . LPRD (laryngopharyngeal reflux disease) 01/26/2016  . Erosive esophagitis 11/27/2015  . Schatzki's ring of distal esophagus 11/27/2015  . Chronic sinusitis 10/16/2015  . Deviated nasal septum 10/16/2015  . Nasal polyp 10/16/2015  . Osteoporosis 08/15/2015  . Dysphagia, pharyngoesophageal phase 04/01/2015  . Endometrial polyp 10/24/2014  . Fatigue 07/09/2014  . Hyperlipidemia   . Breast cancer, right ([place]) 02/01/2014  . Malignant melanoma of arm ([place]) 11/23/2013  . Nasal septum ulceration  10/16/2013  . Seborrheic keratosis 10/16/2013  . Vitamin D deficiency 02/08/2013  . Sciatica of right side 01/24/2013  . Esophageal ulcer 01/24/2013  . Esophageal stenosis 01/24/2013  . Choroidal nevus of right eye 10/27/2012  . Impacted cerumen of right ear 10/11/2012  . Frequency 01/19/2012  . Gastroesophageal reflux disease 09/01/2011  . 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 H/O cataract extraction 04/30/2011  . Borderline glaucoma steroid responder 04/30/2011  . Obesity 10/16/2008  . Breast cancer of lower-outer quadrant of right female breast ([place]) 08/29/2008    HARRIS,KAREN PTA 11/28/2017, 4:20 PM  [facility]  [place], [place], [address] Phone: [phone]   Fax:  [phone]  Name: [name] MRN: [id number] Date of Birth: [birth year]

## 2017-11-28 NOTE — Patient Instructions (Signed)
Stretch hip flexor prone vs off bed

## 2017-11-28 NOTE — Therapy (Signed)
[place], [place], [address] Phone: [phone]   Fax:  [phone]  Physical Therapy Treatment  Patient Details  Name: [name] MRN: [id number] Date of Birth: Dec 29, 1945 Referring Provider: Gayland Curry    Encounter Date: 11/24/2017  PT End of Session - 11/28/17 1450    Visit Number  13    Number of Visits  17    Date for PT Re-Evaluation  12/13/17    PT Start Time  1330    PT Stop Time  1417    PT Time Calculation (min)  47 min    Activity Tolerance  Patient tolerated treatment well    Behavior During Therapy  [facility] for tasks assessed/performed       Past Medical History:  Diagnosis Date  . Asymptomatic varicose veins   . Cancer (HCC)    breast   . Diverticulosis   . DM type 2 (diabetes mellitus, type 2) ([place]) 10/23/2014  . Dysuria   . GERD (gastroesophageal reflux disease)   . Hiatal hernia   . Hyperlipidemia   . Malignant neoplasm of breast (female), unspecified site   . Migraine without aura, without mention of intractable migraine without mention of status migrainosus   . Other abnormal blood chemistry   . Pain in joint, pelvic region and thigh   . Shingles    in eyes  . Stricture and stenosis of esophagus   . Trigger finger (acquired)   . Unspecified cataract   . Unspecified glaucoma(365.9)     Past Surgical History:  Procedure Laterality Date  . APPENDECTOMY  1973   Dr. Linzie Collin  . BREAST LUMPECTOMY Right 08/29/2008   Dr. Erroll Luna  . ESOPHAGEAL DILATION    . NASAL POLYP EXCISION  10/2015  . POLYPECTOMY  11/20/14   Uterine Polyp Removed    There were no vitals filed for this visit.  Subjective Assessment - 11/28/17 1338    Subjective  Sit muscle   sore.  I want to work on the spot  that hurts.  I probably work on exercises 20 to 30 minutes a day. I let my leg hang off the bed   to stretch the hip flexer.      Currently in Pain?  Yes    Pain Score  3     Pain  Location  Back    Pain Orientation  Lower    Pain Descriptors / Indicators  Aching    Pain Radiating Towards  Mid gluteal area in left hip  and into left calf.      Pain Frequency  Intermittent    Aggravating Factors   woke up with it   about 6 am    Pain Relieving Factors  DN  sleeping with hip on stretch    Effect of Pain on Daily Activities  limits                       [facility] Adult PT Treatment/Exercise - 11/28/17 0001      Ambulation/Gait   Gait Comments  --      Self-Care   Other Self-Care Comments   reviewed, practiced picking things up with proper form   vs forward trunk lean .  practiced with a cane aling spine.  Painflu in low back with this.       Lumbar Exercises: Stretches   Passive Hamstring Stretch  3 reps;30 seconds    Hip Flexor Stretch  3 reps;30 seconds off edge of ant and in sidelying.   Cued to not do at home      Lumbar Exercises: Supine   Ab Set  10 reps    Pelvic Tilt Limitations  fatigues      Knee/Hip Exercises: Stretches   Other Knee/Hip Stretches  hip ER stretch  prone over 2 pillows,,  2 sets 20 seconds,       Knee/Hip Exercises: Aerobic   Nustep  L4 6.5 minutes      Knee/Hip Exercises: Prone   Other Prone Exercises  gluteal sets       Manual Therapy   Manual Therapy  Soft tissue mobilization    Manual therapy comments  neuromuscular trigger point release,  myofascial release left hip,  tissue softened     Soft tissue mobilization  instrument assist    Passive ROM  Hip IR  prone,  antewreior hip stretches             PT Education - 11/28/17 1448    Education provided  Yes    Education Details  stretch prone over pillows  vs stretch off the edge of bed.  lifting practice    Person(s) Educated  Patient    Methods  Explanation    Comprehension  Verbalized understanding;Returned demonstration       PT Short Term Goals - 11/28/17 1616      PT SHORT TERM GOAL #1   Title  Patient to be independent with appopriate and  progressive HEP, to be updated PRN     Time  2    Period  Weeks    Status  Achieved      PT SHORT TERM GOAL #2   Title  Patient to experience pain L hip as being no more than 2/10 at worst and symptoms peripheralizing no further than L greater trochanter in order to improve QOL and activity tolerance     Baseline  up to 5/10    Time  4    Period  Weeks    Status  Partially Met      PT SHORT TERM GOAL #3   Title  Patient to demonstrate an improvement of at least 1 MMT in grade in all tested  muscle groups in order to assist in improving mobility and reducing pain     Time  4    Period  Weeks    Status  Unable to assess      PT SHORT TERM GOAL #4   Title  Patient to be able to maintain correct upright posture at least 75% of the time without external cues in order to reduce pain and assist in addressing soft tissue and flexibility limitaitons     Baseline  this continues to be a challange when she has pain, this makes her feel better.    Time  4    Period  Weeks    Status  On-going        PT Long Term Goals - 11/15/17 1632      PT LONG TERM GOAL #1   Title  Patient to demonstrate L hip as being no more than 30% limited in flexibility all planes in order to allow her to more easily perform functional tasks such as putting shoes and socks on     Time  8    Period  Weeks    Status  On-going      PT LONG TERM GOAL #2   Title  Patient to demonstrate improved gait mechanics including improved hip rotation and mobility during gait, equal step/stance times, and upright posture in order to improve quality of motion and reduce pain     Time  8    Period  Weeks    Status  On-going      PT LONG TERM GOAL #3   Title  Patient to demonstrate correct functional biomechanics for tasks such as bed mobility, loading/unloading drier, and getting up into high car in order to prevent exacerbation of pain     Time  8    Period  Weeks    Status  On-going      PT LONG TERM GOAL #4   Title   Patient to be able to stand and ambulate without increase in pain for at least 60 minutes in order to improve QOL and assist in return to prior level of activity     Time  8    Period  Weeks    Status  On-going            Plan - 11/28/17 1453    Clinical Impression Statement  Pain mid gluteal returned.  it may have increased with excessive stretching hip flexor off edge of mat with pain over the weekend.    Pain decreased with manual.  She needs work with pelvic trunk dissasociation,.  She was able to list 5 + ways to use good posture at home and even has taught her friends.    PT Next Visit Plan  Try to wean from manual.  consider sitting or standing stabilization.  Review HEP and pelvic disasocoiation,  MMT for STG's    PT Home Exercise Plan  Eval: lumbar rotations, hip flexor stretch, 3D hip excursions , PPT , Marching , clam, piriformis stretching and adductor ball squeeze    Consulted and Agree with Plan of Care  Patient       Patient will benefit from skilled therapeutic intervention in order to improve the following deficits and impairments:     Visit Diagnosis: Difficulty in walking, not elsewhere classified  Pain in left hip  Muscle weakness (generalized)  Stiffness of left hip, not elsewhere classified     Problem List Patient Active Problem List   Diagnosis Date Noted  . Prediabetes 05/26/2017  . Dysuria 11/03/2016  . Insomnia 11/03/2016  . Glaucoma 05/05/2016  . LPRD (laryngopharyngeal reflux disease) 01/26/2016  . Erosive esophagitis 11/27/2015  . Schatzki's ring of distal esophagus 11/27/2015  . Chronic sinusitis 10/16/2015  . Deviated nasal septum 10/16/2015  . Nasal polyp 10/16/2015  . Osteoporosis 08/15/2015  . Dysphagia, pharyngoesophageal phase 04/01/2015  . Endometrial polyp 10/24/2014  . Fatigue 07/09/2014  . Hyperlipidemia   . Breast cancer, right ([place]) 02/01/2014  . Malignant melanoma of arm ([place]) 11/23/2013  . Nasal septum ulceration  10/16/2013  . Seborrheic keratosis 10/16/2013  . Vitamin D deficiency 02/08/2013  . Sciatica of right side 01/24/2013  . Esophageal ulcer 01/24/2013  . Esophageal stenosis 01/24/2013  . Choroidal nevus of right eye 10/27/2012  . Impacted cerumen of right ear 10/11/2012  . Frequency 01/19/2012  . Gastroesophageal reflux disease 09/01/2011  . 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 H/O cataract extraction 04/30/2011  . Borderline glaucoma steroid responder 04/30/2011  . Obesity 10/16/2008  . Breast cancer of lower-outer quadrant of right female breast ([place]) 08/29/2008    Nitasha Jewel  PTA 11/28/2017, 4:22 PM  [facility]  [place], [place], [address] Phone: [phone]   Fax:  [phone]  Name: [name] MRN: [id number] Date of Birth: [birth year]

## 2017-11-30 ENCOUNTER — Encounter: Payer: Self-pay | Admitting: Physical Therapy

## 2017-11-30 ENCOUNTER — Ambulatory Visit: Payer: Medicare Other | Admitting: Physical Therapy

## 2017-11-30 ENCOUNTER — Encounter: Payer: Medicare Other | Admitting: Physical Therapy

## 2017-11-30 DIAGNOSIS — R262 Difficulty in walking, not elsewhere classified: Secondary | ICD-10-CM

## 2017-11-30 DIAGNOSIS — M25552 Pain in left hip: Secondary | ICD-10-CM

## 2017-11-30 DIAGNOSIS — M6281 Muscle weakness (generalized): Secondary | ICD-10-CM | POA: Diagnosis not present

## 2017-11-30 DIAGNOSIS — M25652 Stiffness of left hip, not elsewhere classified: Secondary | ICD-10-CM | POA: Diagnosis not present

## 2017-11-30 NOTE — Therapy (Signed)
[place], [place], [address] Phone: [phone]   Fax:  [phone]  Physical Therapy Treatment  Patient Details  Name: [name] MRN: [id number] Date of Birth: Apr 05, 1946 Referring Provider: Gayland Curry    Encounter Date: 11/30/2017  PT End of Session - 11/30/17 1634    Visit Number  14    Number of Visits  17    Authorization Type  Kx mod next visit    PT Start Time  1546    PT Stop Time  1639    PT Time Calculation (min)  53 min    Activity Tolerance  Patient tolerated treatment well    Behavior During Therapy  [facility] for tasks assessed/performed       Past Medical History:  Diagnosis Date  . Asymptomatic varicose veins   . Cancer (HCC)    breast   . Diverticulosis   . DM type 2 (diabetes mellitus, type 2) ([place]) 10/23/2014  . Dysuria   . GERD (gastroesophageal reflux disease)   . Hiatal hernia   . Hyperlipidemia   . Malignant neoplasm of breast (female), unspecified site   . Migraine without aura, without mention of intractable migraine without mention of status migrainosus   . Other abnormal blood chemistry   . Pain in joint, pelvic region and thigh   . Shingles    in eyes  . Stricture and stenosis of esophagus   . Trigger finger (acquired)   . Unspecified cataract   . Unspecified glaucoma(365.9)     Past Surgical History:  Procedure Laterality Date  . APPENDECTOMY  1973   Dr. Linzie Collin  . BREAST LUMPECTOMY Right 08/29/2008   Dr. Erroll Luna  . ESOPHAGEAL DILATION    . NASAL POLYP EXCISION  10/2015  . POLYPECTOMY  11/20/14   Uterine Polyp Removed    There were no vitals filed for this visit.  Subjective Assessment - 11/30/17 1553    Subjective  "I was doing really good yesterday and most of today until one moment and the pain started and I am unsure of what started"     Currently in Pain?  Yes    Pain Score  5  last took medication 1:30    Pain Orientation  Lower    Pain  Descriptors / Indicators  Aching                       [facility] Adult PT Treatment/Exercise - 11/30/17 1556      Self-Care   Other Self-Care Comments   seated avoiding hip ER for long periods of time, should be able to look down and see feet inline with knees.       Lumbar Exercises: Stretches   Piriformis Stretch  3 reps;30 seconds      Knee/Hip Exercises: Aerobic   Nustep  L5 x 7 minLE only      Knee/Hip Exercises: Sidelying   Clams  2x10 .     Other Sidelying Knee/Hip Exercises  reverse clam shell 2 x 10 combined with tack and stretch of piriformis      Modalities   Modalities  Cryotherapy      Moist Heat Therapy   Number Minutes Moist Heat  10 Minutes    Moist Heat Location  Hip in sitting      Manual Therapy   Manual therapy comments  MTPR along the piriformis combined with tack and stretch of piriformis  Soft tissue mobilization  IASTM along glute med and piriformis               PT Short Term Goals - 11/28/17 1616      PT SHORT TERM GOAL #1   Title  Patient to be independent with appopriate and progressive HEP, to be updated PRN     Time  2    Period  Weeks    Status  Achieved      PT SHORT TERM GOAL #2   Title  Patient to experience pain L hip as being no more than 2/10 at worst and symptoms peripheralizing no further than L greater trochanter in order to improve QOL and activity tolerance     Baseline  up to 5/10    Time  4    Period  Weeks    Status  Partially Met      PT SHORT TERM GOAL #3   Title  Patient to demonstrate an improvement of at least 1 MMT in grade in all tested  muscle groups in order to assist in improving mobility and reducing pain     Time  4    Period  Weeks    Status  Unable to assess      PT SHORT TERM GOAL #4   Title  Patient to be able to maintain correct upright posture at least 75% of the time without external cues in order to reduce pain and assist in addressing soft tissue and flexibility limitaitons      Baseline  this continues to be a challange when she has pain, this makes her feel better.    Time  4    Period  Weeks    Status  On-going        PT Long Term Goals - 11/15/17 1632      PT LONG TERM GOAL #1   Title  Patient to demonstrate L hip as being no more than 30% limited in flexibility all planes in order to allow her to more easily perform functional tasks such as putting shoes and socks on     Time  8    Period  Weeks    Status  On-going      PT LONG TERM GOAL #2   Title  Patient to demonstrate improved gait mechanics including improved hip rotation and mobility during gait, equal step/stance times, and upright posture in order to improve quality of motion and reduce pain     Time  8    Period  Weeks    Status  On-going      PT LONG TERM GOAL #3   Title  Patient to demonstrate correct functional biomechanics for tasks such as bed mobility, loading/unloading drier, and getting up into high car in order to prevent exacerbation of pain     Time  8    Period  Weeks    Status  On-going      PT LONG TERM GOAL #4   Title  Patient to be able to stand and ambulate without increase in pain for at least 60 minutes in order to improve QOL and assist in return to prior level of activity     Time  8    Period  Weeks    Status  On-going            Plan - 11/30/17 1600    Clinical Impression Statement  pt reports pain inthe hip but is unable to pinpoint where  the pain is today. continued Soft tissue work and MTPR along the piriformis and utilizing tack and stretch techniques. continued working on hip strenthening and proper form in seated position. ice pack end of session to calm down soreness.     PT Next Visit Plan  cotniued to wean from manual.  consider sitting or standing stabilization.  Review HEP and pelvic disasocoiation,  assess seated posture,     PT Home Exercise Plan  Eval: lumbar rotations, hip flexor stretch, 3D hip excursions , PPT , Marching , clam, piriformis  stretching and adductor ball squeeze    Consulted and Agree with Plan of Care  Patient       Patient will benefit from skilled therapeutic intervention in order to improve the following deficits and impairments:  Abnormal gait, Increased fascial restricitons, Improper body mechanics, Pain, Increased muscle spasms, Postural dysfunction, Decreased strength, Hypomobility, Decreased mobility, Decreased safety awareness, Difficulty walking, Impaired flexibility  Visit Diagnosis: Pain in left hip  Muscle weakness (generalized)  Stiffness of left hip, not elsewhere classified  Difficulty in walking, not elsewhere classified     Problem List Patient Active Problem List   Diagnosis Date Noted  . Prediabetes 05/26/2017  . Dysuria 11/03/2016  . Insomnia 11/03/2016  . Glaucoma 05/05/2016  . LPRD (laryngopharyngeal reflux disease) 01/26/2016  . Erosive esophagitis 11/27/2015  . Schatzki's ring of distal esophagus 11/27/2015  . Chronic sinusitis 10/16/2015  . Deviated nasal septum 10/16/2015  . Nasal polyp 10/16/2015  . Osteoporosis 08/15/2015  . Dysphagia, pharyngoesophageal phase 04/01/2015  . Endometrial polyp 10/24/2014  . Fatigue 07/09/2014  . Hyperlipidemia   . Breast cancer, right ([place]) 02/01/2014  . Malignant melanoma of arm ([place]) 11/23/2013  . Nasal septum ulceration 10/16/2013  . Seborrheic keratosis 10/16/2013  . Vitamin D deficiency 02/08/2013  . Sciatica of right side 01/24/2013  . Esophageal ulcer 01/24/2013  . Esophageal stenosis 01/24/2013  . Choroidal nevus of right eye 10/27/2012  . Impacted cerumen of right ear 10/11/2012  . Frequency 01/19/2012  . Gastroesophageal reflux disease 09/01/2011  . 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 H/O cataract extraction 04/30/2011  . Borderline glaucoma steroid responder 04/30/2011  . Obesity 10/16/2008  . Breast cancer of lower-outer quadrant of right female  breast ([place]) 08/29/2008   Starr Lake PT, DPT, LAT, ATC  11/30/17  4:35 PM      [facility] [address] [place], [place], [address] Phone: [phone]   Fax:  [phone]  Name: [name] MRN: [id number] Date of Birth: 1946-04-04

## 2017-12-05 ENCOUNTER — Telehealth: Payer: Self-pay | Admitting: Internal Medicine

## 2017-12-05 NOTE — Telephone Encounter (Signed)
I left a message asking the pt to call me at [phone] to schedule AWV. VDM (DD) °

## 2017-12-06 ENCOUNTER — Other Ambulatory Visit: Payer: Self-pay | Admitting: *Deleted

## 2017-12-06 DIAGNOSIS — M810 Age-related osteoporosis without current pathological fracture: Secondary | ICD-10-CM | POA: Diagnosis not present

## 2017-12-06 DIAGNOSIS — M353 Polymyalgia rheumatica: Secondary | ICD-10-CM | POA: Diagnosis not present

## 2017-12-06 DIAGNOSIS — M25511 Pain in right shoulder: Secondary | ICD-10-CM | POA: Diagnosis not present

## 2017-12-06 DIAGNOSIS — M5431 Sciatica, right side: Secondary | ICD-10-CM

## 2017-12-06 DIAGNOSIS — G8929 Other chronic pain: Secondary | ICD-10-CM | POA: Diagnosis not present

## 2017-12-06 DIAGNOSIS — M25512 Pain in left shoulder: Secondary | ICD-10-CM | POA: Diagnosis not present

## 2017-12-06 DIAGNOSIS — M160 Bilateral primary osteoarthritis of hip: Secondary | ICD-10-CM | POA: Diagnosis not present

## 2017-12-06 DIAGNOSIS — Z7952 Long term (current) use of systemic steroids: Secondary | ICD-10-CM | POA: Diagnosis not present

## 2017-12-06 NOTE — Telephone Encounter (Signed)
Patient requested refill [place] Verified LR: 10/31/2017 Pharmacy Confirmed Pended Rx and sent to Dr. Mariea Clonts for approval

## 2017-12-07 ENCOUNTER — Encounter: Payer: Medicare Other | Admitting: Physical Therapy

## 2017-12-07 ENCOUNTER — Ambulatory Visit: Payer: Medicare Other | Admitting: Physical Therapy

## 2017-12-07 ENCOUNTER — Encounter: Payer: Self-pay | Admitting: Physical Therapy

## 2017-12-07 DIAGNOSIS — M6281 Muscle weakness (generalized): Secondary | ICD-10-CM

## 2017-12-07 DIAGNOSIS — M25552 Pain in left hip: Secondary | ICD-10-CM

## 2017-12-07 DIAGNOSIS — R262 Difficulty in walking, not elsewhere classified: Secondary | ICD-10-CM | POA: Diagnosis not present

## 2017-12-07 DIAGNOSIS — M25652 Stiffness of left hip, not elsewhere classified: Secondary | ICD-10-CM | POA: Diagnosis not present

## 2017-12-07 MED ORDER — HYDROCODONE-ACETAMINOPHEN 10-325 MG PO TABS: ORAL_TABLET | ORAL | 0 refills | Status: DC

## 2017-12-07 NOTE — Therapy (Signed)
[place], [place], [address] Phone: [phone]   Fax:  [phone]  Physical Therapy Treatment  Patient Details  Name: [name] MRN: [id number] Date of Birth: [birth year] Referring Provider: Gayland Curry    Encounter Date: 12/07/2017  PT End of Session - 12/07/17 1415    Visit Number  15    Number of Visits  17    Date for PT Re-Evaluation  12/13/17    Authorization Type  KX mod    PT Start Time  1415    PT Stop Time  1458    PT Time Calculation (min)  43 min    Activity Tolerance  Patient tolerated treatment well    Behavior During Therapy  [facility] for tasks assessed/performed       Past Medical History:  Diagnosis Date  . Asymptomatic varicose veins   . Cancer (HCC)    breast   . Diverticulosis   . DM type 2 (diabetes mellitus, type 2) ([place]) 10/23/2014  . Dysuria   . GERD (gastroesophageal reflux disease)   . Hiatal hernia   . Hyperlipidemia   . Malignant neoplasm of breast (female), unspecified site   . Migraine without aura, without mention of intractable migraine without mention of status migrainosus   . Other abnormal blood chemistry   . Pain in joint, pelvic region and thigh   . Shingles    in eyes  . Stricture and stenosis of esophagus   . Trigger finger (acquired)   . Unspecified cataract   . Unspecified glaucoma(365.9)     Past Surgical History:  Procedure Laterality Date  . APPENDECTOMY  1973   Dr. Linzie Collin  . BREAST LUMPECTOMY Right 08/29/2008   Dr. Erroll Luna  . ESOPHAGEAL DILATION    . NASAL POLYP EXCISION  10/2015  . POLYPECTOMY  11/20/14   Uterine Polyp Removed    There were no vitals filed for this visit.  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 I forgot to bring my cane with me today"      Currently in Pain?  Yes    Pain Score  2  last took medication at 1pm    Pain Location  Back    Pain Orientation  Lower    Pain Descriptors / Indicators  Aching    Pain Type  Chronic pain    Pain Onset  In the past 7 days    Pain Frequency  Intermittent    Aggravating Factors   woke up with     Pain Relieving Factors  DN, sleeping with hip                       [facility] Adult PT Treatment/Exercise - 12/07/17 1428      Lumbar Exercises: Seated   Other Seated Lumbar Exercises  low back / hip dissociate during rocking forward and progressing to sit to stand 1 x 10 (heavy frequent verbal/ tactile cues for proper form)      Knee/Hip Exercises: Stretches   Piriformis Stretch  2 reps;30 seconds      Knee/Hip Exercises: Aerobic   Nustep  L5 x 7 minLE only      Knee/Hip Exercises: Seated   Other Seated Knee/Hip Exercises  hip  er 2 x10 with green band    Sit to Sand  10 reps;without UE support;1 set      Knee/Hip Exercises: Sidelying   Other Sidelying Knee/Hip Exercises  2 x 15  in  R sidelying      Moist Heat Therapy   Number Minutes Moist Heat  --    Moist Heat Location  --      Manual Therapy   Manual Therapy  Other (comment)    Manual therapy comments  skilled palpation and monitoring throughout TPDN    Soft tissue mobilization  IASTM along glute med and piriformis    Other Manual Therapy  tack and stretch of piriformis       Trigger Point Dry Needling - 12/07/17 1425    Consent Given?  Yes    Education Handout Provided  No given previously    Piriformis Response  Twitch response elicited;Palpable increased muscle length             PT Short Term Goals - 11/28/17 1616      PT SHORT TERM GOAL #1   Title  Patient to be independent with appopriate and progressive HEP, to be updated PRN     Time  2    Period  Weeks    Status  Achieved      PT SHORT TERM GOAL #2   Title  Patient to experience pain L hip as being no more than 2/10 at worst and  symptoms peripheralizing no further than L greater trochanter in order to improve QOL and activity tolerance     Baseline  up to 5/10    Time  4    Period  Weeks    Status  Partially Met      PT SHORT TERM GOAL #3   Title  Patient to demonstrate an improvement of at least 1 MMT in grade in all tested  muscle groups in order to assist in improving mobility and reducing pain     Time  4    Period  Weeks    Status  Unable to assess      PT SHORT TERM GOAL #4   Title  Patient to be able to maintain correct upright posture at least 75% of the time without external cues in order to reduce pain and assist in addressing soft tissue and flexibility limitaitons     Baseline  this continues to be a challange when she has pain, this makes her feel better.    Time  4    Period  Weeks    Status  On-going        PT Long Term Goals - 11/15/17 1632      PT LONG TERM GOAL #1   Title  Patient to demonstrate L hip as being no more than 30% limited in flexibility all planes in order to allow her to more easily perform functional tasks such as putting shoes and socks on     Time  8    Period  Weeks    Status  On-going      PT LONG TERM GOAL #2   Title  Patient to demonstrate improved gait mechanics including improved hip rotation and mobility during gait, equal step/stance times, and upright posture in order to improve quality of motion and reduce pain     Time  8    Period  Weeks    Status  On-going      PT LONG TERM GOAL #3  Title  Patient to demonstrate correct functional biomechanics for tasks such as bed mobility, loading/unloading drier, and getting up into high car in order to prevent exacerbation of pain     Time  8    Period  Weeks    Status  On-going      PT LONG TERM GOAL #4   Title  Patient to be able to stand and ambulate without increase in pain for at least 60 minutes in order to improve QOL and assist in return to prior level of activity     Time  8    Period  Weeks    Status   On-going            Plan - 12/07/17 1435    Clinical Impression Statement  pt reports improvement in pain but continues to have 2-3/10 pain today. Continued TPDN focusing of piriformis followed with IASTM techniques. continued strengthening and practiced lumbopelvic dissociation which she had difficulty performing with verbal/ visual cues for fom. MHP end of session to calm down soreness.     PT Next Visit Plan  reponse to Dn,  consider sitting or standing stabilization.  Review HEP and pelvic disasocoiation,  assess seated posture,     PT Home Exercise Plan  lumbar rotations, hip flexor stretch, 3D hip excursions , PPT , Marching , clam, piriformis stretching and adductor ball squeeze    Consulted and Agree with Plan of Care  Patient       Patient will benefit from skilled therapeutic intervention in order to improve the following deficits and impairments:  Abnormal gait, Increased fascial restricitons, Improper body mechanics, Pain, Increased muscle spasms, Postural dysfunction, Decreased strength, Hypomobility, Decreased mobility, Decreased safety awareness, Difficulty walking, Impaired flexibility  Visit Diagnosis: Pain in left hip  Muscle weakness (generalized)  Stiffness of left hip, not elsewhere classified  Difficulty in walking, not elsewhere classified     Problem List Patient Active Problem List   Diagnosis Date Noted  . Prediabetes 05/26/2017  . Dysuria 11/03/2016  . Insomnia 11/03/2016  . Glaucoma 05/05/2016  . LPRD (laryngopharyngeal reflux disease) 01/26/2016  . Erosive esophagitis 11/27/2015  . Schatzki's ring of distal esophagus 11/27/2015  . Chronic sinusitis 10/16/2015  . Deviated nasal septum 10/16/2015  . Nasal polyp 10/16/2015  . Osteoporosis 08/15/2015  . Dysphagia, pharyngoesophageal phase 04/01/2015  . Endometrial polyp 10/24/2014  . Fatigue 07/09/2014  . Hyperlipidemia   . Breast cancer, right ([place]) 02/01/2014  . Malignant melanoma of arm  ([place]) 11/23/2013  . Nasal septum ulceration 10/16/2013  . Seborrheic keratosis 10/16/2013  . Vitamin D deficiency 02/08/2013  . Sciatica of right side 01/24/2013  . Esophageal ulcer 01/24/2013  . Esophageal stenosis 01/24/2013  . Choroidal nevus of right eye 10/27/2012  . Impacted cerumen of right ear 10/11/2012  . Frequency 01/19/2012  . Gastroesophageal reflux disease 09/01/2011  . 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 H/O cataract extraction 04/30/2011  . Borderline glaucoma steroid responder 04/30/2011  . Obesity 10/16/2008  . Breast cancer of lower-outer quadrant of right female breast ([place]) 08/29/2008   Starr Lake PT, DPT, LAT, ATC  12/07/17  3:00 PM      [place] [facility] [address] [place], [place], [address] Phone: [phone]   Fax:  [phone]  Name: MORGANE JOERGER MRN: [id number] Date of Birth: 01/10/1946

## 2017-12-08 ENCOUNTER — Encounter: Payer: Self-pay | Admitting: Physical Therapy

## 2017-12-08 ENCOUNTER — Ambulatory Visit: Payer: Medicare Other | Admitting: Physical Therapy

## 2017-12-08 DIAGNOSIS — R262 Difficulty in walking, not elsewhere classified: Secondary | ICD-10-CM

## 2017-12-08 DIAGNOSIS — M25652 Stiffness of left hip, not elsewhere classified: Secondary | ICD-10-CM | POA: Diagnosis not present

## 2017-12-08 DIAGNOSIS — M25552 Pain in left hip: Secondary | ICD-10-CM

## 2017-12-08 DIAGNOSIS — M6281 Muscle weakness (generalized): Secondary | ICD-10-CM | POA: Diagnosis not present

## 2017-12-08 NOTE — Therapy (Signed)
[place], [place], [address] Phone: [phone]   Fax:  [phone]  Physical Therapy Treatment  Patient Details  Name: [name] MRN: [id number] Date of Birth: 01/12/1946 Referring Provider: Gayland Curry    Encounter Date: 12/08/2017  PT End of Session - 12/08/17 1552    Visit Number  16    Number of Visits  17    Date for PT Re-Evaluation  12/13/17    PT Start Time  3818    PT Stop Time  1627    PT Time Calculation (min)  35 min    Activity Tolerance  Patient tolerated treatment well    Behavior During Therapy  [facility] for tasks assessed/performed       Past Medical History:  Diagnosis Date  . Asymptomatic varicose veins   . Cancer (HCC)    breast   . Diverticulosis   . DM type 2 (diabetes mellitus, type 2) ([place]) 10/23/2014  . Dysuria   . GERD (gastroesophageal reflux disease)   . Hiatal hernia   . Hyperlipidemia   . Malignant neoplasm of breast (female), unspecified site   . Migraine without aura, without mention of intractable migraine without mention of status migrainosus   . Other abnormal blood chemistry   . Pain in joint, pelvic region and thigh   . Shingles    in eyes  . Stricture and stenosis of esophagus   . Trigger finger (acquired)   . Unspecified cataract   . Unspecified glaucoma(365.9)     Past Surgical History:  Procedure Laterality Date  . APPENDECTOMY  1973   Dr. Linzie Collin  . BREAST LUMPECTOMY Right 08/29/2008   Dr. Erroll Luna  . ESOPHAGEAL DILATION    . NASAL POLYP EXCISION  10/2015  . POLYPECTOMY  11/20/14   Uterine Polyp Removed    There were no vitals filed for this visit.  Subjective Assessment - 12/08/17 1552    Subjective  "I am feeling really good today"    Currently in Pain?  Yes    Pain Score  1     Pain Descriptors / Indicators  Aching    Pain Type  Chronic pain    Pain Onset  More than a month ago    Pain Frequency  Intermittent                        [facility] Adult PT Treatment/Exercise - 12/08/17 1605      Knee/Hip Exercises: Standing   Heel Raises  2 sets;10 reps with inversion from step    Heel Raises Limitations  frequent verbal cues to keep the knee straigh    Hip Abduction  2 sets;10 reps;Knee straight;Both;Stengthening with red theraband    Hip Extension  2 sets;Stengthening;Both;Knee straight;10 reps with red theraband      Knee/Hip Exercises: Seated   Other Seated Knee/Hip Exercises  hip er 2 x10 with green band, 1 set going to fatigue     Sit to Sand  2 sets;10 reps with glute set      Ankle Exercises: Seated   Towel Crunch  5 reps       Trigger Point Dry Needling - 12/07/17 1425    Consent Given?  Yes    Education Handout Provided  No given previously    Piriformis Response  Twitch response elicited;Palpable increased muscle length           PT Education - 12/08/17  1608    Education provided  Yes    Education Details  reviewed and updated HEP for ankle strengthening.     Person(s) Educated  Patient    Methods  Explanation;Verbal cues;Handout    Comprehension  Verbalized understanding;Verbal cues required       PT Short Term Goals - 11/28/17 1616      PT SHORT TERM GOAL #1   Title  Patient to be independent with appopriate and progressive HEP, to be updated PRN     Time  2    Period  Weeks    Status  Achieved      PT SHORT TERM GOAL #2   Title  Patient to experience pain L hip as being no more than 2/10 at worst and symptoms peripheralizing no further than L greater trochanter in order to improve QOL and activity tolerance     Baseline  up to 5/10    Time  4    Period  Weeks    Status  Partially Met      PT SHORT TERM GOAL #3   Title  Patient to demonstrate an improvement of at least 1 MMT in grade in all tested  muscle groups in order to assist in improving mobility and reducing pain     Time  4    Period  Weeks    Status  Unable to assess      PT SHORT TERM  GOAL #4   Title  Patient to be able to maintain correct upright posture at least 75% of the time without external cues in order to reduce pain and assist in addressing soft tissue and flexibility limitaitons     Baseline  this continues to be a challange when she has pain, this makes her feel better.    Time  4    Period  Weeks    Status  On-going        PT Long Term Goals - 11/15/17 1632      PT LONG TERM GOAL #1   Title  Patient to demonstrate L hip as being no more than 30% limited in flexibility all planes in order to allow her to more easily perform functional tasks such as putting shoes and socks on     Time  8    Period  Weeks    Status  On-going      PT LONG TERM GOAL #2   Title  Patient to demonstrate improved gait mechanics including improved hip rotation and mobility during gait, equal step/stance times, and upright posture in order to improve quality of motion and reduce pain     Time  8    Period  Weeks    Status  On-going      PT LONG TERM GOAL #3   Title  Patient to demonstrate correct functional biomechanics for tasks such as bed mobility, loading/unloading drier, and getting up into high car in order to prevent exacerbation of pain     Time  8    Period  Weeks    Status  On-going      PT LONG TERM GOAL #4   Title  Patient to be able to stand and ambulate without increase in pain for at least 60 minutes in order to improve QOL and assist in return to prior level of activity     Time  8    Period  Weeks    Status  On-going  Plan - 12/08/17 1608    Clinical Impression Statement  due to pt reporting she felt good since yesterday focused on hip strengthening and ankle strengtheing for support of bil arches to promote distal limb control. She performed all exercises well with minimal cues. updated HEP for arch strengthening. end of session she reported no changes in pain at end of session.     PT Next Visit Plan  ERO, reponse to Dn,  consider sitting  or standing stabilization.  Review HEP and pelvic disasocoiation,  assess seated posture,     PT Home Exercise Plan  lumbar rotations, hip flexor stretch, 3D hip excursions , PPT , Marching , clam, piriformis stretching and adductor ball squeeze    Consulted and Agree with Plan of Care  Patient       Patient will benefit from skilled therapeutic intervention in order to improve the following deficits and impairments:  Abnormal gait, Increased fascial restricitons, Improper body mechanics, Pain, Increased muscle spasms, Postural dysfunction, Decreased strength, Hypomobility, Decreased mobility, Decreased safety awareness, Difficulty walking, Impaired flexibility  Visit Diagnosis: Pain in left hip  Muscle weakness (generalized)  Stiffness of left hip, not elsewhere classified  Difficulty in walking, not elsewhere classified     Problem List Patient Active Problem List   Diagnosis Date Noted  . Prediabetes 05/26/2017  . Dysuria 11/03/2016  . Insomnia 11/03/2016  . Glaucoma 05/05/2016  . LPRD (laryngopharyngeal reflux disease) 01/26/2016  . Erosive esophagitis 11/27/2015  . Schatzki's ring of distal esophagus 11/27/2015  . Chronic sinusitis 10/16/2015  . Deviated nasal septum 10/16/2015  . Nasal polyp 10/16/2015  . Osteoporosis 08/15/2015  . Dysphagia, pharyngoesophageal phase 04/01/2015  . Endometrial polyp 10/24/2014  . Fatigue 07/09/2014  . Hyperlipidemia   . Breast cancer, right ([place]) 02/01/2014  . Malignant melanoma of arm ([place]) 11/23/2013  . Nasal septum ulceration 10/16/2013  . Seborrheic keratosis 10/16/2013  . Vitamin D deficiency 02/08/2013  . Sciatica of right side 01/24/2013  . Esophageal ulcer 01/24/2013  . Esophageal stenosis 01/24/2013  . Choroidal nevus of right eye 10/27/2012  . Impacted cerumen of right ear 10/11/2012  . Frequency 01/19/2012  . Gastroesophageal reflux disease 09/01/2011  . 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 H/O cataract extraction 04/30/2011  . Borderline glaucoma steroid responder 04/30/2011  . Obesity 10/16/2008  . Breast cancer of lower-outer quadrant of right female breast ([place]) 08/29/2008   Starr Lake PT, DPT, LAT, ATC  12/08/17  4:30 PM      [place], [place], [address] Phone: [phone]   Fax:  [phone]  Name: [name] MRN: [id number] Date of Birth: 08-19-1945

## 2017-12-12 ENCOUNTER — Encounter: Payer: Self-pay | Admitting: Physical Therapy

## 2017-12-12 ENCOUNTER — Ambulatory Visit: Payer: Medicare Other | Admitting: Physical Therapy

## 2017-12-12 DIAGNOSIS — M6281 Muscle weakness (generalized): Secondary | ICD-10-CM | POA: Diagnosis not present

## 2017-12-12 DIAGNOSIS — M25552 Pain in left hip: Secondary | ICD-10-CM | POA: Diagnosis not present

## 2017-12-12 DIAGNOSIS — R262 Difficulty in walking, not elsewhere classified: Secondary | ICD-10-CM

## 2017-12-12 DIAGNOSIS — M25652 Stiffness of left hip, not elsewhere classified: Secondary | ICD-10-CM | POA: Diagnosis not present

## 2017-12-12 NOTE — Therapy (Signed)
[place], [place], [address] Phone: [phone]   Fax:  [phone]  Physical Therapy Treatment / Discharge Summary  Patient Details  Name: RACQUELLE HYSER MRN: [id number] Date of Birth: 11-Jul-1946 Referring Provider: Gayland Curry    Encounter Date: 12/12/2017  PT End of Session - 12/12/17 1326    Visit Number  17    Number of Visits  17    Date for PT Re-Evaluation  12/13/17    Authorization Type  KX mod    PT Start Time  1330    PT Stop Time  1410    PT Time Calculation (min)  40 min    Activity Tolerance  Patient tolerated treatment well    Behavior During Therapy  [facility] for tasks assessed/performed       Past Medical History:  Diagnosis Date  . Asymptomatic varicose veins   . Cancer (HCC)    breast   . Diverticulosis   . DM type 2 (diabetes mellitus, type 2) ([place]) 10/23/2014  . Dysuria   . GERD (gastroesophageal reflux disease)   . Hiatal hernia   . Hyperlipidemia   . Malignant neoplasm of breast (female), unspecified site   . Migraine without aura, without mention of intractable migraine without mention of status migrainosus   . Other abnormal blood chemistry   . Pain in joint, pelvic region and thigh   . Shingles    in eyes  . Stricture and stenosis of esophagus   . Trigger finger (acquired)   . Unspecified cataract   . Unspecified glaucoma(365.9)     Past Surgical History:  Procedure Laterality Date  . APPENDECTOMY  1973   Dr. Linzie Collin  . BREAST LUMPECTOMY Right 08/29/2008   Dr. Erroll Luna  . ESOPHAGEAL DILATION    . NASAL POLYP EXCISION  10/2015  . POLYPECTOMY  11/20/14   Uterine Polyp Removed    There were no vitals filed for this visit.  Subjective Assessment - 12/12/17 1325    Subjective  "[place] the race track on Saturday night and I am feeling more soreness today at 3-4/10 today, and it feels like my legs jabbing up"     Patient Stated Goals  get rid of pain, solve  problem and be normal again     Currently in Pain?  Yes    Pain Score  4  last took pain medicaiton at 11am    Pain Orientation  Left    Pain Descriptors / Indicators  Aching;Jabbing;Sore    Pain Type  Chronic pain    Aggravating Factors   walking for long periods of time, and walking up hill,     Pain Relieving Factors  DN, sleeping, warm bath, medication         [facility] PT Assessment - 12/12/17 1339      Observation/Other Assessments   Focus on Therapeutic Outcomes (FOTO)   52% limited      AROM   Lumbar Flexion  WFL: constant ache in the L hip    Lumbar Extension  mild limitation:  improved compared to previous      Strength   Left Hip Flexion  4/5    Left Hip ABduction  4-/5 pain during testing    Right Knee Flexion  4+/5    Right Knee Extension  5/5    Left Knee Flexion  5/5    Left Knee Extension  5/5 pain noted in the L hip with positioning  [facility] Adult PT Treatment/Exercise - 12/12/17 0001      Manual Therapy   Soft tissue mobilization  IASTM along glute med and piriformis    Other Manual Therapy  tack and stretch of piriformis             PT Education - 12/12/17 1406    Education provided  Yes    Education Details  reviewed previously provided HEP, and benefits of continued exercises and rest breaks. discussed progress in regard to inital measures. reviewed posture and gait mechanics     Person(s) Educated  Patient    Methods  Explanation;Verbal cues;Demonstration    Comprehension  Verbalized understanding;Verbal cues required       PT Short Term Goals - 12/12/17 1345      PT SHORT TERM GOAL #1   Title  Patient to be independent with appopriate and progressive HEP, to be updated PRN     Time  2    Period  Weeks    Status  Achieved      PT SHORT TERM GOAL #2   Title  Patient to experience pain L hip as being no more than 2/10 at worst and symptoms peripheralizing no further than L greater trochanter in order to improve  QOL and activity tolerance     Time  4    Period  Weeks    Status  Partially Met      PT SHORT TERM GOAL #3   Title  Patient to demonstrate an improvement of at least 1 MMT in grade in all tested  muscle groups in order to assist in improving mobility and reducing pain     Period  Weeks    Status  Partially Met      PT SHORT TERM GOAL #4   Title  Patient to be able to maintain correct upright posture at least 75% of the time without external cues in order to reduce pain and assist in addressing soft tissue and flexibility limitaitons     Time  4    Period  Weeks    Status  Partially Met        PT Long Term Goals - 12/12/17 1402      PT LONG TERM GOAL #1   Title  Patient to demonstrate L hip as being no more than 30% limited in flexibility all planes in order to allow her to more easily perform functional tasks such as putting shoes and socks on     Time  8    Period  Weeks    Status  Not Met      PT LONG TERM GOAL #2   Title  Patient to demonstrate improved gait mechanics including improved hip rotation and mobility during gait, equal step/stance times, and upright posture in order to improve quality of motion and reduce pain     Time  8    Period  Weeks    Status  Partially Met      PT LONG TERM GOAL #3   Title  Patient to demonstrate correct functional biomechanics for tasks such as bed mobility, loading/unloading drier, and getting up into high car in order to prevent exacerbation of pain     Time  8    Period  Weeks    Status  Partially Met      PT LONG TERM GOAL #4   Title  Patient to be able to stand and ambulate without increase in pain for at least 60  minutes in order to improve QOL and assist in return to prior level of activity     Time  8    Period  Weeks    Status  Partially Met            Plan - 12/12/17 1403    Clinical Impression Statement  Mrs. Salada reports continued pain in the L hip that seems to conitnued to return with walking/ standing. She  has partially met all goals except for LTG #4. she reports she feels she has made progress and is better than when she started. Based on visit count and limited objective measurable progress as well as FOTO score improving only 2 points plan to refer back to her md for further assessment with pt agreed.     PT Next Visit Plan  D/C    PT Home Exercise Plan  lumbar rotations, hip flexor stretch, 3D hip excursions , PPT , Marching , clam, piriformis stretching and adductor ball squeeze    Consulted and Agree with Plan of Care  Patient       Patient will benefit from skilled therapeutic intervention in order to improve the following deficits and impairments:  Abnormal gait, Increased fascial restricitons, Improper body mechanics, Pain, Increased muscle spasms, Postural dysfunction, Decreased strength, Hypomobility, Decreased mobility, Decreased safety awareness, Difficulty walking, Impaired flexibility  Visit Diagnosis: Pain in left hip  Muscle weakness (generalized)  Stiffness of left hip, not elsewhere classified  Difficulty in walking, not elsewhere classified     Problem List Patient Active Problem List   Diagnosis Date Noted  . Prediabetes 05/26/2017  . Dysuria 11/03/2016  . Insomnia 11/03/2016  . Glaucoma 05/05/2016  . LPRD (laryngopharyngeal reflux disease) 01/26/2016  . Erosive esophagitis 11/27/2015  . Schatzki's ring of distal esophagus 11/27/2015  . Chronic sinusitis 10/16/2015  . Deviated nasal septum 10/16/2015  . Nasal polyp 10/16/2015  . Osteoporosis 08/15/2015  . Dysphagia, pharyngoesophageal phase 04/01/2015  . Endometrial polyp 10/24/2014  . Fatigue 07/09/2014  . Hyperlipidemia   . Breast cancer, right ([place]) 02/01/2014  . Malignant melanoma of arm ([place]) 11/23/2013  . Nasal septum ulceration 10/16/2013  . Seborrheic keratosis 10/16/2013  . Vitamin D deficiency 02/08/2013  . Sciatica of right side 01/24/2013  . Esophageal ulcer 01/24/2013  . Esophageal  stenosis 01/24/2013  . Choroidal nevus of right eye 10/27/2012  . Impacted cerumen of right ear 10/11/2012  . Frequency 01/19/2012  . Gastroesophageal reflux disease 09/01/2011  . 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 H/O cataract extraction 04/30/2011  . Borderline glaucoma steroid responder 04/30/2011  . Obesity 10/16/2008  . Breast cancer of lower-outer quadrant of right female breast ([place]) 08/29/2008    Starr Lake 12/12/2017, 2:09 PM  [facility] [address] [place], [place], [address] Phone: [phone]   Fax:  [phone]  Name: LETRICE POLLOK MRN: [id number] Date of Birth: 04-30-1946              PHYSICAL THERAPY DISCHARGE SUMMARY  Visits from Start of Care: 17  Current functional level related to goals / functional outcomes: See goals, FOTO 52% limited   Remaining deficits: Continued intermittent glute medius/ piriformis pain. Weakness in hip abductors. Limited trunk extension mobility. Limited standing/ walking endurance   Education / Equipment: HEP, theraband, posture  Plan: Patient agrees to discharge.  Patient goals were partially met. Patient is being discharged due to lack of progress.  ?????  Dilpreet Faires PT, DPT, LAT, ATC  12/12/17  2:12 PM

## 2017-12-13 ENCOUNTER — Ambulatory Visit: Payer: Medicare Other | Admitting: Physical Therapy

## 2017-12-27 DIAGNOSIS — Z853 Personal history of malignant neoplasm of breast: Secondary | ICD-10-CM | POA: Diagnosis not present

## 2017-12-27 DIAGNOSIS — C4361 Malignant melanoma of right upper limb, including shoulder: Secondary | ICD-10-CM | POA: Diagnosis not present

## 2017-12-27 DIAGNOSIS — C799 Secondary malignant neoplasm of unspecified site: Secondary | ICD-10-CM | POA: Diagnosis not present

## 2017-12-27 DIAGNOSIS — Z170 Estrogen receptor positive status [ER+]: Secondary | ICD-10-CM | POA: Diagnosis not present

## 2017-12-27 DIAGNOSIS — C50511 Malignant neoplasm of lower-outer quadrant of right female breast: Secondary | ICD-10-CM | POA: Diagnosis not present

## 2017-12-27 DIAGNOSIS — Z9221 Personal history of antineoplastic chemotherapy: Secondary | ICD-10-CM | POA: Diagnosis not present

## 2017-12-27 DIAGNOSIS — Z08 Encounter for follow-up examination after completed treatment for malignant neoplasm: Secondary | ICD-10-CM | POA: Diagnosis not present

## 2017-12-29 DIAGNOSIS — Z8742 Personal history of other diseases of the female genital tract: Secondary | ICD-10-CM | POA: Diagnosis not present

## 2017-12-29 DIAGNOSIS — N950 Postmenopausal bleeding: Secondary | ICD-10-CM | POA: Diagnosis not present

## 2017-12-29 DIAGNOSIS — Z85820 Personal history of malignant melanoma of skin: Secondary | ICD-10-CM | POA: Diagnosis not present

## 2017-12-29 DIAGNOSIS — N840 Polyp of corpus uteri: Secondary | ICD-10-CM | POA: Diagnosis not present

## 2017-12-29 DIAGNOSIS — Z853 Personal history of malignant neoplasm of breast: Secondary | ICD-10-CM | POA: Diagnosis not present

## 2018-01-02 ENCOUNTER — Other Ambulatory Visit: Payer: Self-pay | Admitting: *Deleted

## 2018-01-02 MED ORDER — LORAZEPAM 1 MG PO TABS: 0.5000 mg | ORAL_TABLET | Freq: Every evening | ORAL | 0 refills | Status: DC | PRN

## 2018-01-03 ENCOUNTER — Ambulatory Visit (INDEPENDENT_AMBULATORY_CARE_PROVIDER_SITE_OTHER): Payer: Medicare Other

## 2018-01-03 VITALS — BP 155/74 | HR 76 | Temp 98.2°F | Ht 64.0 in | Wt 243.0 lb

## 2018-01-03 DIAGNOSIS — Z0000 Encounter for general adult medical examination without abnormal findings: Secondary | ICD-10-CM | POA: Diagnosis not present

## 2018-01-03 NOTE — Progress Notes (Signed)
Subjective:   [name] is a 72 y.o. female who presents for Medicare Annual (Subsequent) preventive examination.  Last AWV-10/29/2016    Objective:     Vitals: BP (!) 155/74 (BP Location: Left Arm, Patient Position: Sitting)   Pulse 76   Temp 98.2 F (36.8 C) (Oral)   Ht 5\' 4"  (1.626 m)   Wt 243 lb (110.2 kg)   SpO2 97%   BMI 41.71 kg/m   Body mass index is 41.71 kg/m.  Provider notified of BP  Advanced Directives 01/03/2018 10/18/2017 09/26/2017 05/26/2017 01/18/2017 01/12/2017 10/29/2016  Does Patient Have a Medical Advance Directive? No No No No No No No  Would patient like information on creating a medical advance directive? Yes (MAU/Ambulatory/Procedural Areas - Information given) Yes (MAU/Ambulatory/Procedural Areas - Information given) No - Patient declined No - Patient declined - Yes (MAU/Ambulatory/Procedural Areas - Information given) Yes (MAU/Ambulatory/Procedural Areas - Information given)    Tobacco Social History   Tobacco Use  Smoking Status Never Smoker  Smokeless Tobacco Never Used     Counseling given: Not Answered   Clinical Intake:     Pain : No/denies pain     Nutritional Risks: None Diabetes: Yes CBG done?: No Did pt. bring in CBG monitor from home?: No  How often do you need to have someone help you when you read instructions, pamphlets, or other written materials from your doctor or pharmacy?: 1 - Never What is the last grade level you completed in school?: Some College  Interpreter Needed?: No  Information entered by :: Tyson Dense, RN  Past Medical History:  Diagnosis Date  . Asymptomatic varicose veins   . Cancer (HCC)    breast   . Diverticulosis   . DM type 2 (diabetes mellitus, type 2) ([place]) 10/23/2014  . Dysuria   . GERD (gastroesophageal reflux disease)   . Hiatal hernia   . Hyperlipidemia   . Malignant neoplasm of breast (female), unspecified site   . Migraine without aura, without mention of intractable migraine  without mention of status migrainosus   . Other abnormal blood chemistry   . Pain in joint, pelvic region and thigh   . Shingles    in eyes  . Stricture and stenosis of esophagus   . Trigger finger (acquired)   . Unspecified cataract   . Unspecified glaucoma(365.9)    Past Surgical History:  Procedure Laterality Date  . APPENDECTOMY  1973   Dr. Linzie Collin  . BREAST LUMPECTOMY Right 08/29/2008   Dr. Erroll Luna  . ESOPHAGEAL DILATION    . NASAL POLYP EXCISION  10/2015  . POLYPECTOMY  11/20/14   Uterine Polyp Removed   Family History  Problem Relation Age of Onset  . Hypertension Mother   . Hyperlipidemia Mother   . Alzheimer's disease Mother   . Emphysema Father   . COPD Father    Social History   Socioeconomic History  . Marital status: Divorced    Spouse name: Not on file  . Number of children: Not on file  . Years of education: Not on file  . Highest education level: Not on file  Occupational History  . Not on file  Social Needs  . Financial resource strain: Not hard at all  . Food insecurity:    Worry: Never true    Inability: Never true  . Transportation needs:    Medical: No    Non-medical: No  Tobacco Use  . Smoking status: Never Smoker  . Smokeless  tobacco: Never Used  Substance and Sexual Activity  . Alcohol use: No  . Drug use: No  . Sexual activity: Not Currently  Lifestyle  . Physical activity:    Days per week: 3 days    Minutes per session: 10 min  . Stress: To some extent  Relationships  . Social connections:    Talks on phone: Twice a week    Gets together: Twice a week    Attends religious service: Never    Active member of club or organization: No    Attends meetings of clubs or organizations: Never    Relationship status: Divorced  Other Topics Concern  . Not on file  Social History Narrative  . Not on file    Outpatient Encounter Medications as of 01/03/2018  Medication Sig  . ARTIFICIAL TEAR OP Place 1 % into the right eye  daily.   . Calcium Citrate-Vitamin D 250-200 MG-UNIT TABS Take 250 mg by mouth 2 (two) times daily.  . Cholecalciferol (VITAMIN D-3) 5000 units TABS Take by mouth daily.  . Cyanocobalamin (B-12) 5000 MCG CAPS Take by mouth daily.  . fluorometholone (FML) 0.1 % ophthalmic suspension Place 1 drop into the right eye daily.  Marland Kitchen HYDROcodone-acetaminophen (NORCO) 10-325 MG tablet Take one tablet by mouth every 8 hours as needed for pain  . LORazepam (ATIVAN) 1 MG tablet Take 0.5-1 tablets (0.5-1 mg total) by mouth at bedtime as needed for sleep.  Marland Kitchen omeprazole (PRILOSEC) 40 MG capsule Take 40 mg by mouth.  . predniSONE (DELTASONE) 5 MG tablet TAKE 3 TABLETS (15 MG TOTAL) BY MOUTH DAILY WITH BREAKFAST. (Patient taking differently: Take 12.5 mg by mouth daily with breakfast. )  . timolol (TIMOPTIC) 0.5 % ophthalmic solution PLACE 1 DROP INTO THE RIGHT EYE 2 TIMES DAILY.  . valACYclovir (VALTREX) 1000 MG tablet Take 1,000 mg by mouth daily.   . cyclobenzaprine (FLEXERIL) 10 MG tablet Take 1 tablet (10 mg total) by mouth 2 (two) times daily.  . meloxicam (MOBIC) 7.5 MG tablet Take 7.5 mg by mouth daily.   No facility-administered encounter medications on file as of 01/03/2018.     Activities of Daily Living In your present state of health, do you have any difficulty performing the following activities: 01/03/2018  Hearing? N  Vision? N  Difficulty concentrating or making decisions? N  Walking or climbing stairs? Y  Dressing or bathing? N  Doing errands, shopping? N  Preparing Food and eating ? N  Using the Toilet? N  In the past six months, have you accidently leaked urine? N  Do you have problems with loss of bowel control? N  Managing your Medications? N  Managing your Finances? N  Housekeeping or managing your Housekeeping? N  Some recent data might be hidden    Patient Care Team: Gayland Curry, DO as PCP - General (Geriatric Medicine) Harmon Dun, NP (Oncology) Magrinat, Virgie Dad, MD  as Consulting Physician (Oncology) Erroll Luna, MD as Consulting Physician (General Surgery) Harvie Heck, MD as Physician Assistant (Internal Medicine) Chyrel Masson, DO as Anesthesiologist (Otolaryngology) Wendie Chess, Chrystie Nose, MD (Otolaryngology)    Assessment:   This is a routine wellness examination for [name].  Exercise Activities and Dietary recommendations Current Exercise Habits: Home exercise routine, Type of exercise: stretching, Time (Minutes): 10, Frequency (Times/Week): 3, Weekly Exercise (Minutes/Week): 30, Intensity: Mild, Exercise limited by: None identified  Goals    . Exercise 3x per week (30 min per time)  Starting 10/29/2016 I would like to start walking 3 days a week for 1-2 miles.       Fall Risk Fall Risk  01/03/2018 09/26/2017 05/26/2017 01/18/2017 01/12/2017  Falls in the past year? No No No No No   Is the patient's home free of loose throw rugs in walkways, pet beds, electrical cords, etc?   yes      Grab bars in the bathroom? No      Handrails on the stairs?   yes      Adequate lighting?   yes  Depression Screen PHQ 2/9 Scores 01/03/2018 09/26/2017 05/26/2017 10/29/2016  PHQ - 2 Score 0 0 0 0     Cognitive Function MMSE - Mini Mental State Exam 01/03/2018 10/29/2016 04/24/2014  Orientation to time 5 5 5   Orientation to Place 5 5 5   Registration 3 3 3   Attention/ Calculation 5 5 5   Recall 1 3 2   Language- name 2 objects 2 2 2   Language- repeat 1 1 1   Language- follow 3 step command 3 3 3   Language- read & follow direction 1 1 1   Write a sentence 1 1 1   Copy design 1 1 1   Total score 28 30 29         Immunization History  Administered Date(s) Administered  . Influenza Whole 07/26/2010  . Influenza,inj,Quad PF,6+ Mos 04/24/2014, 04/01/2015, 05/05/2016  . Influenza-Unspecified 06/30/2010, 04/25/2013  . Pneumococcal Conjugate-13 10/23/2014  . Pneumococcal Polysaccharide-23 10/29/2016  . Td 06/24/2009  . Tdap 07/26/2008  . Zoster 10/17/2013     Qualifies for Shingles Vaccine? Yes, educated and declined  Screening Tests Health Maintenance  Topic Date Due  . Hepatitis C Screening  04/26/1946  . URINE MICROALBUMIN  07/03/1955  . FOOT EXAM  03/31/2016  . OPHTHALMOLOGY EXAM  04/01/2017  . MAMMOGRAM  07/27/2017  . HEMOGLOBIN A1C  11/23/2017  . INFLUENZA VACCINE  02/23/2018  . COLONOSCOPY  03/26/2019  . TETANUS/TDAP  06/25/2019  . DEXA SCAN  Completed  . PNA vac Low Risk Adult  Completed    Cancer Screenings: Lung: Low Dose CT Chest recommended if Age 71-80 years, 30 pack-year currently smoking OR have quit w/in 15years. Patient does not qualify. Breast:  Up to date on Mammogram? Yes   Up to date of Bone Density/Dexa? Yes Colorectal: up to date  Additional Screenings:  Hepatitis C Screening: declined Patient stated she has eye exams every 3-4 months    Plan:    I have personally reviewed and addressed the Medicare Annual Wellness questionnaire and have noted the following in the patient's chart:  A. Medical and social history B. Use of alcohol, tobacco or illicit drugs  C. Current medications and supplements D. Functional ability and status E.  Nutritional status F.  Physical activity G. Advance directives H. List of other physicians I.  Hospitalizations, surgeries, and ER visits in previous 12 months J.  [place] to include hearing, vision, cognitive, depression L. Referrals and appointments - none  In addition, I have reviewed and discussed with patient certain preventive protocols, quality metrics, and best practice recommendations. A written personalized care plan for preventive services as well as general preventive health recommendations were provided to patient.  See attached scanned questionnaire for additional information.   Signed,   Tyson Dense, RN Nurse Health Advisor  Patient Concerns: None

## 2018-01-03 NOTE — Patient Instructions (Addendum)
[name] , Thank you for taking time to come for your Medicare Wellness Visit. I appreciate your ongoing commitment to your health goals. Please review the following plan we discussed and let me know if I can assist you in the future.   Screening recommendations/referrals: Colonoscopy up to date, due 03/25/2021 Mammogram up to date Bone Density up to date Recommended yearly ophthalmology/optometry visit for glaucoma screening and checkup Recommended yearly dental visit for hygiene and checkup  Vaccinations: Influenza vaccine due 2019 fall season Pneumococcal vaccine up to date, completed Tdap vaccine up to date, due 06/25/2019 Shingles vaccine due, declined    Advanced directives: Advance directive discussed with you today. I have provided a copy for you to complete at home and have notarized. Once this is complete please bring a copy in to our office so we can scan it into your chart.  Conditions/risks identified: none  Next appointment: Dr. Mariea Clonts 02/02/2018 @ 2:30pm            Tyson Dense, RN 01/08/2019 @ 1:30pm   Preventive Care 65 Years and Older, Female Preventive care refers to lifestyle choices and visits with your health care provider that can promote health and wellness. What does preventive care include?  A yearly physical exam. This is also called an annual well check.  Dental exams once or twice a year.  Routine eye exams. Ask your health care provider how often you should have your eyes checked.  Personal lifestyle choices, including:  Daily care of your teeth and gums.  Regular physical activity.  Eating a healthy diet.  Avoiding tobacco and drug use.  Limiting alcohol use.  Practicing safe sex.  Taking low-dose aspirin every day.  Taking vitamin and mineral supplements as recommended by your health care provider. What happens during an annual well check? The services and screenings done by your health care provider during your annual well check will  depend on your age, overall health, lifestyle risk factors, and family history of disease. Counseling  Your health care provider may ask you questions about your:  Alcohol use.  Tobacco use.  Drug use.  Emotional well-being.  Home and relationship well-being.  Sexual activity.  Eating habits.  History of falls.  Memory and ability to understand (cognition).  Work and work Statistician.  Reproductive health. Screening  You may have the following tests or measurements:  Height, weight, and BMI.  Blood pressure.  Lipid and cholesterol levels. These may be checked every 5 years, or more frequently if you are over 23 years old.  Skin check.  Lung cancer screening. You may have this screening every year starting at age 107 if you have a 30-pack-year history of smoking and currently smoke or have quit within the past 15 years.  Fecal occult blood test (FOBT) of the stool. You may have this test every year starting at age 30.  Flexible sigmoidoscopy or colonoscopy. You may have a sigmoidoscopy every 5 years or a colonoscopy every 10 years starting at age 50.  Hepatitis C blood test.  Hepatitis B blood test.  Sexually transmitted disease (STD) testing.  Diabetes screening. This is done by checking your blood sugar (glucose) after you have not eaten for a while (fasting). You may have this done every 1-3 years.  Bone density scan. This is done to screen for osteoporosis. You may have this done starting at age 37.  Mammogram. This may be done every 1-2 years. Talk to your health care provider about how often you  should have regular mammograms. Talk with your health care provider about your test results, treatment options, and if necessary, the need for more tests. Vaccines  Your health care provider may recommend certain vaccines, such as:  Influenza vaccine. This is recommended every year.  Tetanus, diphtheria, and acellular pertussis (Tdap, Td) vaccine. You may need a  Td booster every 10 years.  Zoster vaccine. You may need this after age 2.  Pneumococcal 13-valent conjugate (PCV13) vaccine. One dose is recommended after age 2.  Pneumococcal polysaccharide (PPSV23) vaccine. One dose is recommended after age 30. Talk to your health care provider about which screenings and vaccines you need and how often you need them. This information is not intended to replace advice given to you by your health care provider. Make sure you discuss any questions you have with your health care provider. Document Released: 08/08/2015 Document Revised: 03/31/2016 Document Reviewed: 05/13/2015 Elsevier Interactive Patient Education  2017 [place] Prevention in the Home Falls can cause injuries. They can happen to people of all ages. There are many things you can do to make your home safe and to help prevent falls. What can I do on the outside of my home?  Regularly fix the edges of walkways and driveways and fix any cracks.  Remove anything that might make you trip as you walk through a door, such as a raised step or threshold.  Trim any bushes or trees on the path to your home.  Use bright outdoor lighting.  Clear any walking paths of anything that might make someone trip, such as rocks or tools.  Regularly check to see if handrails are loose or broken. Make sure that both sides of any steps have handrails.  Any raised decks and porches should have guardrails on the edges.  Have any leaves, snow, or ice cleared regularly.  Use sand or salt on walking paths during winter.  Clean up any spills in your garage right away. This includes oil or grease spills. What can I do in the bathroom?  Use night lights.  Install grab bars by the toilet and in the tub and shower. Do not use towel bars as grab bars.  Use non-skid mats or decals in the tub or shower.  If you need to sit down in the shower, use a plastic, non-slip stool.  Keep the floor dry. Clean  up any water that spills on the floor as soon as it happens.  Remove soap buildup in the tub or shower regularly.  Attach bath mats securely with double-sided non-slip rug tape.  Do not have throw rugs and other things on the floor that can make you trip. What can I do in the bedroom?  Use night lights.  Make sure that you have a light by your bed that is easy to reach.  Do not use any sheets or blankets that are too big for your bed. They should not hang down onto the floor.  Have a firm chair that has side arms. You can use this for support while you get dressed.  Do not have throw rugs and other things on the floor that can make you trip. What can I do in the kitchen?  Clean up any spills right away.  Avoid walking on wet floors.  Keep items that you use a lot in easy-to-reach places.  If you need to reach something above you, use a strong step stool that has a grab bar.  Keep electrical cords out  of the way.  Do not use floor polish or wax that makes floors slippery. If you must use wax, use non-skid floor wax.  Do not have throw rugs and other things on the floor that can make you trip. What can I do with my stairs?  Do not leave any items on the stairs.  Make sure that there are handrails on both sides of the stairs and use them. Fix handrails that are broken or loose. Make sure that handrails are as long as the stairways.  Check any carpeting to make sure that it is firmly attached to the stairs. Fix any carpet that is loose or worn.  Avoid having throw rugs at the top or bottom of the stairs. If you do have throw rugs, attach them to the floor with carpet tape.  Make sure that you have a light switch at the top of the stairs and the bottom of the stairs. If you do not have them, ask someone to add them for you. What else can I do to help prevent falls?  Wear shoes that:  Do not have high heels.  Have rubber bottoms.  Are comfortable and fit you well.  Are  closed at the toe. Do not wear sandals.  If you use a stepladder:  Make sure that it is fully opened. Do not climb a closed stepladder.  Make sure that both sides of the stepladder are locked into place.  Ask someone to hold it for you, if possible.  Clearly mark and make sure that you can see:  Any grab bars or handrails.  First and last steps.  Where the edge of each step is.  Use tools that help you move around (mobility aids) if they are needed. These include:  Canes.  Walkers.  Scooters.  Crutches.  Turn on the lights when you go into a dark area. Replace any light bulbs as soon as they burn out.  Set up your furniture so you have a clear path. Avoid moving your furniture around.  If any of your floors are uneven, fix them.  If there are any pets around you, be aware of where they are.  Review your medicines with your doctor. Some medicines can make you feel dizzy. This can increase your chance of falling. Ask your doctor what other things that you can do to help prevent falls. This information is not intended to replace advice given to you by your health care provider. Make sure you discuss any questions you have with your health care provider. Document Released: 05/08/2009 Document Revised: 12/18/2015 Document Reviewed: 08/16/2014 Elsevier Interactive Patient Education  2017 [employer].

## 2018-01-09 DIAGNOSIS — D3131 Benign neoplasm of right choroid: Secondary | ICD-10-CM | POA: Diagnosis not present

## 2018-01-09 DIAGNOSIS — H02401 Unspecified ptosis of right eyelid: Secondary | ICD-10-CM | POA: Diagnosis not present

## 2018-01-09 DIAGNOSIS — H25812 Combined forms of age-related cataract, left eye: Secondary | ICD-10-CM | POA: Diagnosis not present

## 2018-01-09 DIAGNOSIS — H527 Unspecified disorder of refraction: Secondary | ICD-10-CM | POA: Diagnosis not present

## 2018-01-09 DIAGNOSIS — H04123 Dry eye syndrome of bilateral lacrimal glands: Secondary | ICD-10-CM | POA: Diagnosis not present

## 2018-01-09 DIAGNOSIS — H209 Unspecified iridocyclitis: Secondary | ICD-10-CM | POA: Diagnosis not present

## 2018-01-09 DIAGNOSIS — H4041X2 Glaucoma secondary to eye inflammation, right eye, moderate stage: Secondary | ICD-10-CM | POA: Diagnosis not present

## 2018-01-09 DIAGNOSIS — Z961 Presence of intraocular lens: Secondary | ICD-10-CM | POA: Diagnosis not present

## 2018-01-17 ENCOUNTER — Other Ambulatory Visit: Payer: Self-pay

## 2018-01-17 DIAGNOSIS — M5431 Sciatica, right side: Secondary | ICD-10-CM

## 2018-01-17 MED ORDER — HYDROCODONE-ACETAMINOPHEN 10-325 MG PO TABS: ORAL_TABLET | ORAL | 0 refills | Status: DC

## 2018-01-17 NOTE — Telephone Encounter (Signed)
Patient requesting refill for hydrocodone to be sent today.   [place] database confirmed Last refill date 12/07/17 Routed to Dr. Mariea Clonts

## 2018-01-19 ENCOUNTER — Telehealth: Payer: Self-pay | Admitting: *Deleted

## 2018-01-19 DIAGNOSIS — C4361 Malignant melanoma of right upper limb, including shoulder: Secondary | ICD-10-CM | POA: Diagnosis not present

## 2018-01-19 NOTE — Telephone Encounter (Signed)
Pharmacy called and stated that they have to speak with Provider in order to fill patient's Pain Medication Hydrocodone due to patient being on Lorazepam also. Please call [phone]

## 2018-01-19 NOTE — Telephone Encounter (Signed)
Spoke with Minette Brine at [facility] and they will process the refill.

## 2018-01-25 ENCOUNTER — Ambulatory Visit (INDEPENDENT_AMBULATORY_CARE_PROVIDER_SITE_OTHER): Payer: Medicare Other | Admitting: Podiatry

## 2018-01-25 ENCOUNTER — Encounter: Payer: Self-pay | Admitting: Podiatry

## 2018-01-25 DIAGNOSIS — M79675 Pain in left toe(s): Secondary | ICD-10-CM

## 2018-01-25 DIAGNOSIS — B351 Tinea unguium: Secondary | ICD-10-CM | POA: Diagnosis not present

## 2018-01-25 DIAGNOSIS — M79674 Pain in right toe(s): Secondary | ICD-10-CM

## 2018-01-25 NOTE — Progress Notes (Signed)
Complaint:  Visit Type: Patient returns to my office for continued preventative foot care services. Complaint: Patient states" my third toenails both feet  have grown long and thick and become painful to walk and wear shoes"  The patient presents for preventative foot care services. No changes to ROS  Podiatric Exam: Vascular: dorsalis pedis and posterior tibial pulses are palpable bilateral. Capillary return is immediate. Temperature gradient is WNL. Skin turgor WNL  Sensorium: Normal Semmes Weinstein monofilament test. Normal tactile sensation bilaterally. Nail Exam: Pt has thick disfigured discolored nails with subungual debris noted third toenails both feet. Ulcer Exam: There is no evidence of ulcer or pre-ulcerative changes or infection. Orthopedic Exam: Muscle tone and strength are WNL. No limitations in general ROM. No crepitus or effusions noted. Foot type and digits show no abnormalities. Bony prominences are unremarkable. Skin: No Porokeratosis. No infection or ulcers  Diagnosis:  Onychomycosis, , Pain in right toe, pain in left toes  Treatment & Plan Procedures and Treatment: Consent by patient was obtained for treatment procedures.   Debridement of mycotic and hypertrophic toenails, 1 through 5 bilateral and clearing of subungual debris. No ulceration, no infection noted.  Return Visit-Office Procedure: Patient instructed to return to the office for a follow up visit 3 months for continued evaluation and treatment.    Micalah Cabezas DPM 

## 2018-02-02 ENCOUNTER — Encounter: Payer: Self-pay | Admitting: Internal Medicine

## 2018-02-02 ENCOUNTER — Ambulatory Visit (INDEPENDENT_AMBULATORY_CARE_PROVIDER_SITE_OTHER): Payer: Medicare Other | Admitting: Internal Medicine

## 2018-02-02 VITALS — BP 122/66 | HR 80 | Temp 98.4°F | Ht 64.0 in | Wt 242.0 lb

## 2018-02-02 DIAGNOSIS — Z6841 Body Mass Index (BMI) 40.0 and over, adult: Secondary | ICD-10-CM

## 2018-02-02 DIAGNOSIS — R7303 Prediabetes: Secondary | ICD-10-CM

## 2018-02-02 DIAGNOSIS — C4361 Malignant melanoma of right upper limb, including shoulder: Secondary | ICD-10-CM

## 2018-02-02 DIAGNOSIS — M48062 Spinal stenosis, lumbar region with neurogenic claudication: Secondary | ICD-10-CM

## 2018-02-02 DIAGNOSIS — M353 Polymyalgia rheumatica: Secondary | ICD-10-CM | POA: Diagnosis not present

## 2018-02-02 NOTE — Patient Instructions (Addendum)
Try lyrica 50mg  at bedtime for nerve pain coming from the stenosis in your back.  If you tolerate it well, you may start lyrica 50mg  twice a day.    I have placed a referral to nutrition for weight loss counseling.    Please find time to complete your advance directives and bring [place] a copy.   Mediterranean Diet A Mediterranean diet refers to food and lifestyle choices that are based on the traditions of countries located on the [employer]. This way of eating has been shown to help prevent certain conditions and improve outcomes for people who have chronic diseases, like kidney disease and heart disease. What are tips for following this plan? Lifestyle  Cook and eat meals together with your family, when possible.  Drink enough fluid to keep your urine clear or pale yellow.  Be physically active every day. This includes: ? Aerobic exercise like running or swimming. ? Leisure activities like gardening, walking, or housework.  Get 7-8 hours of sleep each night.  If recommended by your health care provider, drink red wine in moderation. This means 1 glass a day for nonpregnant women and 2 glasses a day for men. A glass of wine equals 5 oz (150 mL). Reading food labels  Check the serving size of packaged foods. For foods such as rice and pasta, the serving size refers to the amount of cooked product, not dry.  Check the total fat in packaged foods. Avoid foods that have saturated fat or trans fats.  Check the ingredients list for added sugars, such as corn syrup. Shopping  At the grocery store, buy most of your food from the areas near the walls of the store. This includes: ? Fresh fruits and vegetables (produce). ? Grains, beans, nuts, and seeds. Some of these may be available in unpackaged forms or large amounts (in bulk). ? Fresh seafood. ? Poultry and eggs. ? Low-fat dairy products.  Buy whole ingredients instead of prepackaged foods.  Buy fresh fruits and vegetables  in-season from local farmers markets.  Buy frozen fruits and vegetables in resealable bags.  If you do not have access to quality fresh seafood, buy precooked frozen shrimp or canned fish, such as tuna, salmon, or sardines.  Buy small amounts of raw or cooked vegetables, salads, or olives from the deli or salad bar at your store.  Stock your pantry so you always have certain foods on hand, such as olive oil, canned tuna, canned tomatoes, rice, pasta, and beans. Cooking  Cook foods with extra-virgin olive oil instead of using butter or other vegetable oils.  Have meat as a side dish, and have vegetables or grains as your main dish. This means having meat in small portions or adding small amounts of meat to foods like pasta or stew.  Use beans or vegetables instead of meat in common dishes like chili or lasagna.  Experiment with different cooking methods. Try roasting or broiling vegetables instead of steaming or sauteing them.  Add frozen vegetables to soups, stews, pasta, or rice.  Add nuts or seeds for added healthy fat at each meal. You can add these to yogurt, salads, or vegetable dishes.  Marinate fish or vegetables using olive oil, lemon juice, garlic, and fresh herbs. Meal planning  Plan to eat 1 vegetarian meal one day each week. Try to work up to 2 vegetarian meals, if possible.  Eat seafood 2 or more times a week.  Have healthy snacks readily available, such as: ? Vegetable sticks  with hummus. ? [place] yogurt. ? Fruit and nut trail mix.  Eat balanced meals throughout the week. This includes: ? Fruit: 2-3 servings a day ? Vegetables: 4-5 servings a day ? Low-fat dairy: 2 servings a day ? Fish, poultry, or lean meat: 1 serving a day ? Beans and legumes: 2 or more servings a week ? Nuts and seeds: 1-2 servings a day ? Whole grains: 6-8 servings a day ? Extra-virgin olive oil: 3-4 servings a day  Limit red meat and sweets to only a few servings a month What are my  food choices?  Mediterranean diet ? Recommended ? Grains: Whole-grain pasta. Brown rice. Bulgar wheat. Polenta. Couscous. Whole-wheat bread. [place]. ? Vegetables: Artichokes. Beets. Broccoli. Cabbage. Carrots. Eggplant. Green beans. Chard. Kale. Spinach. Onions. Leeks. Peas. Squash. Tomatoes. Peppers. Radishes. ? Fruits: Apples. Apricots. Avocado. Berries. Bananas. Cherries. Dates. Figs. Grapes. Lemons. Melon. Oranges. Peaches. Plums. Pomegranate. ? Meats and other protein foods: Beans. Almonds. Sunflower seeds. Pine nuts. Peanuts. [place]. Salmon. Scallops. Shrimp. [place]. Tilapia. Clams. Oysters. Eggs. ? Dairy: Low-fat milk. Cheese. Greek yogurt. ? Beverages: Water. Red wine. Herbal tea. ? Fats and oils: Extra virgin olive oil. Avocado oil. Grape seed oil. ? Sweets and desserts: [place] yogurt with honey. Baked apples. Poached pears. Trail mix. ? Seasoning and other foods: Basil. Cilantro. Coriander. Cumin. Mint. Parsley. Sage. Rosemary. Tarragon. Garlic. Oregano. Thyme. Pepper. Balsalmic vinegar. Tahini. Hummus. Tomato sauce. Olives. Mushrooms. ? Limit these ? Grains: Prepackaged pasta or rice dishes. Prepackaged cereal with added sugar. ? Vegetables: Deep fried potatoes (french fries). ? Fruits: Fruit canned in syrup. ? Meats and other protein foods: Beef. Pork. Lamb. Poultry with skin. Hot dogs. [place] Salines. ? Dairy: Ice cream. Sour cream. Whole milk. ? Beverages: Juice. Sugar-sweetened soft drinks. Beer. Liquor and spirits. ? Fats and oils: Butter. Canola oil. Vegetable oil. Beef fat (tallow). Lard. ? Sweets and desserts: Cookies. Cakes. Pies. Candy. ? Seasoning and other foods: Mayonnaise. Premade sauces and marinades. ? The items listed may not be a complete list. Talk with your dietitian about what dietary choices are right for you. Summary  The Mediterranean diet includes both food and lifestyle choices.  Eat a variety of fresh fruits and vegetables, beans, nuts, seeds, and whole  grains.  Limit the amount of red meat and sweets that you eat.  Talk with your health care provider about whether it is safe for you to drink red wine in moderation. This means 1 glass a day for nonpregnant women and 2 glasses a day for men. A glass of wine equals 5 oz (150 mL). This information is not intended to replace advice given to you by your health care provider. Make sure you discuss any questions you have with your health care provider. Document Released: 03/04/2016 Document Revised: 04/06/2016 Document Reviewed: 03/04/2016 Elsevier Interactive Patient Education  [employer].

## 2018-02-02 NOTE — Progress Notes (Signed)
Location:  [facility] clinic Provider:  Giorgio Chabot L. Mariea Clonts, D.O., C.M.D.  Goals of Care:  Advanced Directives 02/02/2018  Does Patient Have a Medical Advance Directive? No  Would patient like information on creating a medical advance directive? -  has paperwork but has not completed it yet  Chief Complaint  Patient presents with  . Medical Management of Chronic Issues    Pt is being seen for a 4 month routine visit. Pt reports that she is still having widespread pain.   . Audit- C screening    Score of 0  . ACP    Needed- pt has paperwork but had not completed it    HPI: Patient is a 72 y.o. female seen today for medical management of chronic diseases.    She went to PT.  She had dry needling in the back of her left hip and it really helped.  Then she has had days last week where she could hardly walk, then other days she's great.  She's been to the back surgeon and had shots.    Went to the rheumatologist. ESR 63, then 27, for him 38. They weaned her to 12.51m prednisone.  4 days on 127m she could hardly get out of the bed.  She's stayed at 12.8m60m She returns in a couple weeks again.  Today she's walking well, not using a cane for short distance.  Uses for big box stores and long distances.   Now the pain is more in the middle of her back, not around her groin.  She had lumbar spinal stenosis with claudication.  MRI had shown:  Multilevel degenerative disc and facet disease involving the lumbar spine results in moderate canal and bilateral foraminal stenosis at L2-L3 and moderate canal stenosis L3-L4. Gabapentin made her feel like she'd scream when she tried it.  Injection helped temporarily.  She had been on 4 hydrocodone for a while when it was bad.  She tried to get down to 2 per day, but cannot tolerate that.    Was mixing up some antiinflammatory spices and drinking it.  It's helped her aching and blood pressure after restarting it for a week.  She says she's probably eating too much  of a good balanced meal. She does want to lose weight.  Wonders what she should eat together to help that.    Past Medical History:  Diagnosis Date  . Asymptomatic varicose veins   . Cancer (HCC)    breast   . Diverticulosis   . DM type 2 (diabetes mellitus, type 2) ([place]  . Dysuria   . GERD (gastroesophageal reflux disease)   . Hiatal hernia   . Hyperlipidemia   . Malignant neoplasm of breast (female), unspecified site   . Migraine without aura, without mention of intractable migraine without mention of status migrainosus   . Other abnormal blood chemistry   . Pain in joint, pelvic region and thigh   . Shingles    in eyes  . Stricture and stenosis of esophagus   . Trigger finger (acquired)   . Unspecified cataract   . Unspecified glaucoma(365.9)     Past Surgical History:  Procedure Laterality Date  . APPENDECTOMY  1973   Dr. CluLinzie Collin BREAST LUMPECTOMY Right 08/29/2008   Dr. ThoErroll Luna ESOPHAGEAL DILATION    . NASAL POLYP EXCISION  10/2015  . POLYPECTOMY  11/20/14   Uterine Polyp Removed    Allergies  Allergen Reactions  . Adhesive [  Tape] Dermatitis  . Betimol [Timolol Maleate]     Allergic to preservatives   . Cosopt [Dorzolamide Hcl-Timolol Mal] Other (See Comments)    Turns eye red, increases eye pressure  . Penicillins   . Tamoxifen Other (See Comments)    Vaginal bleeding  . Thimerosal     Makes pt eye red     Outpatient Encounter Medications as of 02/02/2018  Medication Sig  . ARTIFICIAL TEAR OP Place 1 % into the right eye daily.   . Calcium Citrate-Vitamin D 250-200 MG-UNIT TABS Take 250 mg by mouth 2 (two) times daily.  . Cholecalciferol (VITAMIN D-3) 5000 units TABS Take by mouth daily.  . Cyanocobalamin (B-12) 5000 MCG CAPS Take by mouth daily.  . fluorometholone (FML) 0.1 % ophthalmic suspension Place 1 drop into the right eye daily.  Marland Kitchen HYDROcodone-acetaminophen (NORCO) 10-325 MG tablet Take one tablet by mouth every 8 hours as  needed for pain  . LORazepam (ATIVAN) 1 MG tablet Take 0.5-1 tablets (0.5-1 mg total) by mouth at bedtime as needed for sleep.  Marland Kitchen omeprazole (PRILOSEC) 40 MG capsule Take 40 mg by mouth.  . predniSONE (DELTASONE) 5 MG tablet Take 12.5 mg by mouth daily with breakfast.  . timolol (TIMOPTIC) 0.5 % ophthalmic solution PLACE 1 DROP INTO THE RIGHT EYE 2 TIMES DAILY.  . valACYclovir (VALTREX) 1000 MG tablet Take 1,000 mg by mouth daily.   . [DISCONTINUED] cyclobenzaprine (FLEXERIL) 10 MG tablet Take 1 tablet (10 mg total) by mouth 2 (two) times daily.  . [DISCONTINUED] meloxicam (MOBIC) 7.5 MG tablet Take 7.5 mg by mouth daily.  . [DISCONTINUED] predniSONE (DELTASONE) 5 MG tablet TAKE 3 TABLETS (15 MG TOTAL) BY MOUTH DAILY WITH BREAKFAST. (Patient taking differently: Take 12.5 mg by mouth daily with breakfast. )   No facility-administered encounter medications on file as of 02/02/2018.     Review of Systems:  Review of Systems  Constitutional: Negative for chills, fever and malaise/fatigue.  HENT: Negative for congestion and hearing loss.   Eyes:       Birdshot retinopathy   Respiratory: Negative for cough and shortness of breath.   Cardiovascular: Negative for chest pain, palpitations and leg swelling.  Gastrointestinal: Negative for abdominal pain, blood in stool, constipation, diarrhea and melena.  Genitourinary: Negative for dysuria.  Musculoskeletal: Negative for falls and joint pain.  Skin: Negative for itching and rash.  Neurological: Negative for dizziness and loss of consciousness.  Endo/Heme/Allergies: Does not bruise/bleed easily.  Psychiatric/Behavioral: Negative for depression and memory loss. The patient is not nervous/anxious and does not have insomnia.     Health Maintenance  Topic Date Due  . Hepatitis C Screening  Aug 28, 1945  . URINE MICROALBUMIN  07/03/1955  . FOOT EXAM  03/31/2016  . OPHTHALMOLOGY EXAM  04/01/2017  . HEMOGLOBIN A1C  11/23/2017  . MAMMOGRAM   03/24/2018 (Originally 07/27/2017)  . INFLUENZA VACCINE  02/23/2018  . COLONOSCOPY  03/26/2019  . TETANUS/TDAP  06/25/2019  . DEXA SCAN  Completed  . PNA vac Low Risk Adult  Completed    Physical Exam: Vitals:   02/02/18 1425  BP: 122/66  Pulse: 80  Temp: 98.4 F (36.9 C)  TempSrc: Oral  SpO2: 94%  Weight: 242 lb (109.8 kg)  Height: _0  (1.626 m)   Body mass index is 41.54 kg/m. Physical Exam  Constitutional: She is oriented to person, place, and time. She appears well-developed and well-nourished. No distress.  HENT:  Head: Normocephalic and atraumatic.  Cardiovascular: Normal  rate, regular rhythm, normal heart sounds and intact distal pulses.  No carotid bruits  Pulmonary/Chest: Effort normal and breath sounds normal. No respiratory distress.  Abdominal: Bowel sounds are normal.  Musculoskeletal: Normal range of motion.  No tenderness, but limps due to back pain and left groin pain  Neurological: She is alert and oriented to person, place, and time. No cranial nerve deficit.  Psychiatric: She has a normal mood and affect.    Labs reviewed: Basic Metabolic Panel: Recent Labs    05/26/17 1451  NA 136  K 4.2  CL 98  CO2 29  GLUCOSE 97  BUN 8  CREATININE 0.55*  CALCIUM 10.3   Liver Function Tests: No results for input(s): AST, ALT, ALKPHOS, BILITOT, PROT, ALBUMIN in the last 8760 hours. No results for input(s): LIPASE, AMYLASE in the last 8760 hours. No results for input(s): AMMONIA in the last 8760 hours. CBC: Recent Labs    05/26/17 1451  WBC 7.0  NEUTROABS 4,739  HGB 12.7  HCT 36.7  MCV 92.9  PLT 311   Lipid Panel: No results for input(s): CHOL, HDL, LDLCALC, TRIG, CHOLHDL, LDLDIRECT in the last 8760 hours. Lab Results  Component Value Date   HGBA1C 5.8 (H) 05/26/2017   Assessment/Plan 1. Spinal stenosis of lumbar region with neurogenic claudication -cont hydrocodone -try adding lyrica 86m qhs, then titrate to bid if tolerates -hoping we'll  be able to gradually reduce hydrocodone -explained that her condition is chronic   2. Malignant melanoma of right upper extremity ([place] -[facility]no [facility] locally and small seb keratosis on left shoulder that's new--being seen next week  3. Polymyalgia rheumatica (HCC) - cont prednisone 12.559m-may have more luck reducing this if pain is truly more neuropathic - Sedimentation rate  4. Prediabetes - given info on mediterranean diet for this and her weight loss and nutrition referral--reports she has never had an hba1c in the diabetic range, but diabetes was inaccurately entered in her record by a previous physician and carried forward--I attempted to remove it again - Amb ref to Medical Nutrition Therapy-MNT - Hemoglobin A1c  5. Class 3 severe obesity due to excess calories with serious comorbidity and body mass index (BMI) of 40.0 to 44.9 in adult ([facility]- worsening due to decreasing mobility from her back pain--does eat a balanced diet--thinks portions and combinations may be the issue and requests nutritional advice - Amb ref to Medical Nutrition Therapy-[facility]  Labs/tests ordered:   Orders Placed This Encounter  Procedures  . Hemoglobin A1c  . Sedimentation rate  . Amb ref to Medical Nutrition Therapy-[facility]    Referral Priority:   Routine    Referral Type:   Consultation    Referral Reason:   Specialty Services Required    Requested Specialty:   Nutrition    Number of Visits Requested:   1    Next appt:  4 mos med mgt   Chene Kasinger L. Zaila Crew, D.O. [facility] [address] [address] Phone (Mon-Fri 8am-5pm):  [phone] Call:  [phone] follow prompts after 5pm & weekends Office Phone:  [phone] Fax:  [phone]

## 2018-02-03 LAB — SEDIMENTATION RATE: Sed Rate: 28 mm/h (ref 0–30)

## 2018-02-03 LAB — HEMOGLOBIN A1C
Hgb A1c MFr Bld: 5.9 % of total Hgb — ABNORMAL HIGH (ref <5.7)
Mean Plasma Glucose: 123 (calc)
eAG (mmol/L): 6.8 (calc)

## 2018-02-08 DIAGNOSIS — D239 Other benign neoplasm of skin, unspecified: Secondary | ICD-10-CM | POA: Diagnosis not present

## 2018-02-08 DIAGNOSIS — D1801 Hemangioma of skin and subcutaneous tissue: Secondary | ICD-10-CM | POA: Diagnosis not present

## 2018-02-08 DIAGNOSIS — L821 Other seborrheic keratosis: Secondary | ICD-10-CM | POA: Diagnosis not present

## 2018-02-08 DIAGNOSIS — I781 Nevus, non-neoplastic: Secondary | ICD-10-CM | POA: Diagnosis not present

## 2018-02-08 DIAGNOSIS — C4361 Malignant melanoma of right upper limb, including shoulder: Secondary | ICD-10-CM | POA: Diagnosis not present

## 2018-02-14 DIAGNOSIS — M160 Bilateral primary osteoarthritis of hip: Secondary | ICD-10-CM | POA: Diagnosis not present

## 2018-02-14 DIAGNOSIS — Z880 Allergy status to penicillin: Secondary | ICD-10-CM | POA: Diagnosis not present

## 2018-02-14 DIAGNOSIS — M1612 Unilateral primary osteoarthritis, left hip: Secondary | ICD-10-CM | POA: Diagnosis not present

## 2018-02-14 DIAGNOSIS — Z91041 Radiographic dye allergy status: Secondary | ICD-10-CM | POA: Diagnosis not present

## 2018-02-14 DIAGNOSIS — Z7952 Long term (current) use of systemic steroids: Secondary | ICD-10-CM | POA: Insufficient documentation

## 2018-02-14 DIAGNOSIS — Z888 Allergy status to other drugs, medicaments and biological substances status: Secondary | ICD-10-CM | POA: Diagnosis not present

## 2018-02-14 DIAGNOSIS — M353 Polymyalgia rheumatica: Secondary | ICD-10-CM | POA: Diagnosis not present

## 2018-02-14 DIAGNOSIS — M810 Age-related osteoporosis without current pathological fracture: Secondary | ICD-10-CM | POA: Diagnosis not present

## 2018-02-22 ENCOUNTER — Other Ambulatory Visit: Payer: Self-pay | Admitting: *Deleted

## 2018-02-22 DIAGNOSIS — M5431 Sciatica, right side: Secondary | ICD-10-CM

## 2018-02-22 DIAGNOSIS — Z029 Encounter for administrative examinations, unspecified: Secondary | ICD-10-CM

## 2018-02-22 MED ORDER — HYDROCODONE-ACETAMINOPHEN 10-325 MG PO TABS: ORAL_TABLET | ORAL | 0 refills | Status: DC

## 2018-02-22 NOTE — Telephone Encounter (Signed)
Patient requested [place] Verified LR: 01/19/2018 Pharmacy Confirmed Pended Rx and sent to Dr. Mariea Clonts for approval.

## 2018-02-26 ENCOUNTER — Other Ambulatory Visit: Payer: Self-pay | Admitting: Internal Medicine

## 2018-03-21 DIAGNOSIS — Z85820 Personal history of malignant melanoma of skin: Secondary | ICD-10-CM | POA: Diagnosis not present

## 2018-03-21 DIAGNOSIS — L578 Other skin changes due to chronic exposure to nonionizing radiation: Secondary | ICD-10-CM | POA: Diagnosis not present

## 2018-03-21 DIAGNOSIS — L821 Other seborrheic keratosis: Secondary | ICD-10-CM | POA: Diagnosis not present

## 2018-03-21 DIAGNOSIS — D485 Neoplasm of uncertain behavior of skin: Secondary | ICD-10-CM | POA: Diagnosis not present

## 2018-03-22 ENCOUNTER — Other Ambulatory Visit: Payer: Self-pay | Admitting: *Deleted

## 2018-03-22 DIAGNOSIS — M5431 Sciatica, right side: Secondary | ICD-10-CM

## 2018-03-22 MED ORDER — HYDROCODONE-ACETAMINOPHEN 10-325 MG PO TABS: ORAL_TABLET | ORAL | 0 refills | Status: DC

## 2018-03-22 NOTE — Telephone Encounter (Signed)
Patient requested Refill [facility] Verified LR: 02/22/2018 Pharmacy Confirmed Pended Rx and sent to Dr. Bubba Camp for approval (Dr. Cyndi Lennert Patient)

## 2018-03-29 ENCOUNTER — Telehealth: Payer: Self-pay | Admitting: *Deleted

## 2018-03-29 NOTE — Telephone Encounter (Signed)
Received Fax from [employer] [phone]- Attending Physician Statement stating patient is of sound mind, competent to handle own business affairs.  Placed paperwork in Dr. Cyndi Lennert folder to review.

## 2018-04-07 ENCOUNTER — Encounter: Payer: Self-pay | Admitting: Internal Medicine

## 2018-04-07 ENCOUNTER — Other Ambulatory Visit: Payer: Self-pay | Admitting: Internal Medicine

## 2018-04-07 ENCOUNTER — Ambulatory Visit
Admission: RE | Admit: 2018-04-07 | Discharge: 2018-04-07 | Disposition: A | Discharge status: Home / Self Care | Payer: Medicare Other | Source: Ambulatory Visit | Attending: Internal Medicine | Admitting: Internal Medicine

## 2018-04-07 ENCOUNTER — Ambulatory Visit (INDEPENDENT_AMBULATORY_CARE_PROVIDER_SITE_OTHER): Payer: Medicare Other | Admitting: Internal Medicine

## 2018-04-07 VITALS — BP 160/80 | HR 79 | Temp 98.6°F | Ht 64.0 in | Wt 245.0 lb

## 2018-04-07 DIAGNOSIS — M48062 Spinal stenosis, lumbar region with neurogenic claudication: Secondary | ICD-10-CM

## 2018-04-07 DIAGNOSIS — M25551 Pain in right hip: Secondary | ICD-10-CM | POA: Diagnosis not present

## 2018-04-07 DIAGNOSIS — M1611 Unilateral primary osteoarthritis, right hip: Secondary | ICD-10-CM | POA: Diagnosis not present

## 2018-04-07 DIAGNOSIS — M353 Polymyalgia rheumatica: Secondary | ICD-10-CM

## 2018-04-07 DIAGNOSIS — C4361 Malignant melanoma of right upper limb, including shoulder: Secondary | ICD-10-CM

## 2018-04-07 NOTE — Patient Instructions (Signed)
Continue current therapy - hydrocodone as needed every 8 hrs, prednisone and continue cold compresses 15 minutes on and 15 minutes off of area  [place] TO SEE ORTHO FOR FURTHER OPTIONS +/- PT (physical therapy)  Follow up with Dr Mariea Clonts as scheduled or sooner if need be.   Hip Pain The hip is the joint between the upper legs and the lower pelvis. The bones, cartilage, tendons, and muscles of your hip joint support your body and allow you to move around. Hip pain can range from a minor ache to severe pain in one or both of your hips. The pain may be felt on the inside of the hip joint near the groin, or the outside near the buttocks and upper thigh. You may also have swelling or stiffness. Follow these instructions at home: Managing pain, stiffness, and swelling  If directed, apply ice to the injured area. ? Put ice in a plastic bag. ? Place a towel between your skin and the bag. ? Leave the ice on for 20 minutes, 2-3 times a day  Sleep with a pillow between your legs on your most comfortable side.  Avoid any activities that cause pain. General instructions  Take over-the-counter and prescription medicines only as told by your health care provider.  Do any exercises as told by your health care provider.  Record the following: ? How often you have hip pain. ? The location of your pain. ? What the pain feels like. ? What makes the pain worse.  Keep all follow-up visits as told by your health care provider. This is important. Contact a health care provider if:  You cannot put weight on your leg.  Your pain or swelling continues or gets worse after one week.  It gets harder to walk.  You have a fever. Get help right away if:  You fall.  You have a sudden increase in pain and swelling in your hip.  Your hip is red or swollen or very tender to touch. Summary  Hip pain can range from a minor ache to severe pain in one or both of your  hips.  The pain may be felt on the inside of the hip joint near the groin, or the outside near the buttocks and upper thigh.  Avoid any activities that cause pain.  Record how often you have hip pain, the location of the pain, what makes it worse and what it feels like. This information is not intended to replace advice given to you by your health care provider. Make sure you discuss any questions you have with your health care provider. Document Released: 12/30/2009 Document Revised: 06/14/2016 Document Reviewed: 06/14/2016 Elsevier Interactive Patient Education  [employer].

## 2018-04-07 NOTE — Progress Notes (Signed)
Patient ID: [name], female   DOB: 03-09-1946, 72 y.o.   MRN: [id number]   [facility] OFFICE  Provider: DR Arletha Grippe  Code Status:  Goals of Care:  Advanced Directives 02/02/2018  Does Patient Have a Medical Advance Directive? No  Would patient like information on creating a medical advance directive? -     Chief Complaint  Patient presents with  . Acute Visit    pain legs and back x1 year    HPI: Patient is a 72 y.o. female seen today for an acute visit for back pain.  Pain is very severe and fluctuates from R>>L. She is followed by [facility] and has rec'd injection in hip and back in the past. She rec'd epidural in the past. Last MRi about 1 yr ago revealed arthritic changes, bulging discs and spinal stenosis. She has had PT and dry needling. She is unable to stand for >5 minutes without pain worsening. She is taking 3 hydrocodone per day which helps + ice and tumeric. Prednisone taper for PMR and is followed by Rheum.   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 No loss of bowel/bladder control  She has hx melanoma  Sed rate 28 in July 2019; A1c 5.9%  Past Medical History:  Diagnosis Date  . Asymptomatic varicose veins   . Cancer (HCC)    breast   . Diverticulosis   . DM type 2 (diabetes mellitus, type 2) ([place]) 10/23/2014  . Dysuria   . GERD (gastroesophageal reflux disease)   . Hiatal hernia   . Hyperlipidemia   . Malignant neoplasm of breast (female), unspecified site   . Migraine without aura, without mention of intractable migraine without mention of status migrainosus   . Other abnormal blood chemistry   . Pain in joint, pelvic region and thigh   . Shingles    in eyes  . Stricture and stenosis of esophagus   . Trigger finger (acquired)   . Unspecified cataract   . Unspecified glaucoma(365.9)     Past Surgical History:  Procedure Laterality Date  . APPENDECTOMY  1973   Dr. Linzie Collin  . BREAST  LUMPECTOMY Right 08/29/2008   Dr. Erroll Luna  . ESOPHAGEAL DILATION    . NASAL POLYP EXCISION  10/2015  . POLYPECTOMY  11/20/14   Uterine Polyp Removed     reports that she has never smoked. She has never used smokeless tobacco. She reports that she does not drink alcohol or use drugs. Social History   Socioeconomic History  . Marital status: Divorced    Spouse name: Not on file  . Number of children: Not on file  . Years of education: Not on file  . Highest education level: Not on file  Occupational History  . Not on file  Social Needs  . Financial resource strain: Not hard at all  . Food insecurity:    Worry: Never true    Inability: Never true  . Transportation needs:    Medical: No    Non-medical: No  Tobacco Use  . Smoking status: Never Smoker  . Smokeless tobacco: Never Used  Substance and Sexual Activity  . Alcohol use: No  . Drug use: No  . Sexual activity: Not Currently  Lifestyle  . Physical activity:    Days per week: 3 days    Minutes per session: 10 min  . Stress: To some extent  Relationships  . Social connections:  Talks on phone: Twice a week    Gets together: Twice a week    Attends religious service: Never    Active member of club or organization: No    Attends meetings of clubs or organizations: Never    Relationship status: Divorced  . Intimate partner violence:    Fear of current or ex partner: No    Emotionally abused: No    Physically abused: No    Forced sexual activity: No  Other Topics Concern  . Not on file  Social History Narrative  . Not on file    Family History  Problem Relation Age of Onset  . Hypertension Mother   . Hyperlipidemia Mother   . Alzheimer's disease Mother   . Emphysema Father   . COPD Father     Allergies  Allergen Reactions  . Adhesive [Tape] Dermatitis  . Betimol [Timolol Maleate]     Allergic to preservatives   . Cosopt [Dorzolamide Hcl-Timolol Mal] Other (See Comments)    Turns eye red,  increases eye pressure  . Penicillins   . Tamoxifen Other (See Comments)    Vaginal bleeding  . Thimerosal     Makes pt eye red     Outpatient Encounter Medications as of 04/07/2018  Medication Sig  . ARTIFICIAL TEAR OP Place 1 % into the right eye daily.   . Calcium Citrate-Vitamin D 250-200 MG-UNIT TABS Take 250 mg by mouth 2 (two) times daily.  . Cholecalciferol (VITAMIN D-3) 5000 units TABS Take by mouth daily.  . Cyanocobalamin (B-12) 5000 MCG CAPS Take by mouth daily.  . fluorometholone (FML) 0.1 % ophthalmic suspension Place 1 drop into the right eye daily.  Marland Kitchen HYDROcodone-acetaminophen (NORCO) 10-325 MG tablet Take one tablet by mouth every 8 hours as needed for pain  . LORazepam (ATIVAN) 1 MG tablet Take 0.5-1 tablets (0.5-1 mg total) by mouth at bedtime as needed for sleep.  Marland Kitchen omeprazole (PRILOSEC) 40 MG capsule Take 40 mg by mouth.  . predniSONE (DELTASONE) 5 MG tablet Take 2.5 tablets (12.5 mg total) by mouth daily with breakfast.  . timolol (TIMOPTIC) 0.5 % ophthalmic solution PLACE 1 DROP INTO THE RIGHT EYE 2 TIMES DAILY.  . valACYclovir (VALTREX) 1000 MG tablet Take 1,000 mg by mouth daily.    No facility-administered encounter medications on file as of 04/07/2018.     Review of Systems:  Review of Systems  Musculoskeletal: Positive for arthralgias, back pain and gait problem.  Neurological: Positive for numbness.  All other systems reviewed and are negative.   Health Maintenance  Topic Date Due  . Hepatitis C Screening  26-Oct-1945  . URINE MICROALBUMIN  07/03/1955  . MAMMOGRAM  07/27/2017  . INFLUENZA VACCINE  02/23/2018  . COLONOSCOPY  03/26/2019  . TETANUS/TDAP  06/25/2019  . DEXA SCAN  Completed  . PNA vac Low Risk Adult  Completed    Physical Exam: Vitals:   04/07/18 1139  BP: (!) 160/80  Pulse: 79  Temp: 98.6 F (37 C)  TempSrc: Oral  SpO2: 97%  Weight: 245 lb (111.1 kg)  Height: 5\' 4"  (1.626 m)   Body mass index is 42.05 kg/m. Physical  Exam  Constitutional: She is oriented to person, place, and time. She appears well-developed and well-nourished.    Looks uncomfortable in NAD  Cardiovascular:  No LE edema b/l. No calf TTP   Musculoskeletal: She exhibits edema and tenderness.  SI joint restriction  right  ASIS TTP right  Pelvic right in flare  Short leg right  Paravertebral muscle hypertrophy with ropy tissue texture changes lumbar and thoracic spine  Increased lumbar lordosis  Reduced right hip internal rotation with pain on ROM  (+) SLR right  Neurological: She is alert and oriented to person, place, and time. She displays no atrophy. She exhibits normal muscle tone. Gait (antalgic) abnormal.  Strength 4/5 in right thigh  Skin: Skin is warm and dry. No rash noted.  Psychiatric: She has a normal mood and affect. Her behavior is normal. Judgment and thought content normal.    Labs reviewed: Basic Metabolic Panel: Recent Labs    05/26/17 1451  NA 136  K 4.2  CL 98  CO2 29  GLUCOSE 97  BUN 8  CREATININE 0.55*  CALCIUM 10.3   Liver Function Tests: No results for input(s): AST, ALT, ALKPHOS, BILITOT, PROT, ALBUMIN in the last 8760 hours. No results for input(s): LIPASE, AMYLASE in the last 8760 hours. No results for input(s): AMMONIA in the last 8760 hours. CBC: Recent Labs    05/26/17 1451  WBC 7.0  NEUTROABS 4,739  HGB 12.7  HCT 36.7  MCV 92.9  PLT 311   Lipid Panel: No results for input(s): CHOL, HDL, LDLCALC, TRIG, CHOLHDL, LDLDIRECT in the last 8760 hours. Lab Results  Component Value Date   HGBA1C 5.9 (H) 02/02/2018    Procedures since last visit: No results found.  Assessment/Plan   ICD-10-CM   1. Pain of right hip joint M25.551 CANCELED: DG HIP UNILAT W OR W/O PELVIS MIN 4 VIEWS RIGHT  2. Spinal stenosis of lumbar region with neurogenic claudication M48.062 CANCELED: DG HIP UNILAT W OR W/O PELVIS MIN 4 VIEWS RIGHT  3. Malignant melanoma of right upper extremity (HCC)  C43.61   4. 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   Blia Totman S. Perlie Gold  [facility] [address] [place], [place] [address] [phone] Cell (Monday-Friday 8 AM - 5 PM) [phone] After 5 PM and follow prompts

## 2018-04-10 ENCOUNTER — Other Ambulatory Visit: Payer: Self-pay

## 2018-04-10 DIAGNOSIS — M25551 Pain in right hip: Secondary | ICD-10-CM

## 2018-04-14 ENCOUNTER — Ambulatory Visit (INDEPENDENT_AMBULATORY_CARE_PROVIDER_SITE_OTHER): Payer: Medicare Other | Admitting: Orthopaedic Surgery

## 2018-04-14 ENCOUNTER — Encounter (INDEPENDENT_AMBULATORY_CARE_PROVIDER_SITE_OTHER): Payer: Self-pay | Admitting: Orthopaedic Surgery

## 2018-04-14 VITALS — BP 178/64 | HR 72 | Ht 64.0 in | Wt 245.0 lb

## 2018-04-14 DIAGNOSIS — M1611 Unilateral primary osteoarthritis, right hip: Secondary | ICD-10-CM

## 2018-04-14 DIAGNOSIS — M48062 Spinal stenosis, lumbar region with neurogenic claudication: Secondary | ICD-10-CM | POA: Diagnosis not present

## 2018-04-14 DIAGNOSIS — Z170 Estrogen receptor positive status [ER+]: Secondary | ICD-10-CM | POA: Diagnosis not present

## 2018-04-14 DIAGNOSIS — C50911 Malignant neoplasm of unspecified site of right female breast: Secondary | ICD-10-CM | POA: Diagnosis not present

## 2018-04-14 LAB — HM MAMMOGRAPHY

## 2018-04-14 NOTE — Addendum Note (Signed)
Addended by: Meyer Cory on: 04/14/2018 10:55 AM   Modules accepted: Orders

## 2018-04-14 NOTE — Progress Notes (Signed)
Office Visit Note   Patient: [name]           Date of Birth: July 21, 1946           MRN: [id number] Visit Date: 04/14/2018              Requested by: Gayland Curry, DO [place], [place] [address] PCP: Gayland Curry, DO   Assessment & Plan: Visit Diagnoses:  1. Unilateral primary osteoarthritis, right hip   2. Spinal stenosis of lumbar region with neurogenic claudication     Plan: I think should benefit from a intra-articular hip injection under fluoroscopy.  She does have multilevel lumbar stenosis moderate at L2-3 and L3-4.  Mild at L4-5.  CT scan of her pelvis done 2012 when she had breast cancer showed some hip osteoarthritis on the right worse than left and she appears to have had only mild progression.  We will set her up for single injection in her hip that is not effective and she would require an MRI scan of her hip for evaluation.  Follow-Up Instructions: Return in about 2 months (around 06/14/2018).   Orders:  No orders of the defined types were placed in this encounter.  No orders of the defined types were placed in this encounter.     Procedures: No procedures performed   Clinical Data: No additional findings.   Subjective: Chief Complaint  Patient presents with  . Right Hip - Pain    HPI 72 year old female with significant back and right groin pain problems with ambulation.  She has difficulty standing ambulating distance can only stand for 5 to 10 minutes and then has to sit down.  When she turns sits or changes position if she moves her right hip she occasionally feels sharp electrical anterior groin pain that radiates down to the mid thigh on the right side.  She states previously she had symptoms similar to this on the left hip but it was mostly lateral and she originally had a trochanteric injection which helped.  Later MRI scan was done at [facility] which showed some anterolisthesis at L2-3, L3-4 and L4-5 degenerative in nature  with moderate central stenosis noted at L2-3 and also L3-4.  Patient's had several epidurals that gave her some relief.  She states the right groin pain currently is the most symptomatic.  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  There is been present for several years she is had treatment for metastatic melanoma immunotherapy and states last scan showed some shrinkage.  She seen multiple dermatologist no and is been able to find a melanoma primary on her.  She saw Dr. Brigitte Pulse orthopedic surgeon at [facility].  She is been taking hydrocodone 5/325 3 tablets daily for pain.  She ambulates with a cane she does better if she leans on a grocery cart.  Review of Systems positive for breast cancer, metastatic melanoma, GE reflux, lumbar spinal stenosis, esophageal ulcer, prediabetes, morbid obesity with BMI 42 with associated prediabetes. Insomnia dysuria and glaucoma.  Otherwise negative as pertains HPI.  Objective: Vital Signs: BP (!) 178/64   Pulse 72   Ht 5\' 4"  (1.626 m)   Wt 245 lb (111.1 kg)   BMI 42.05 kg/m   Physical Exam  Constitutional: She is oriented to person, place, and time. She appears well-developed.  HENT:  Head: Normocephalic.  Right Ear: External ear normal.  Left Ear: External  ear normal.  Eyes: Pupils are equal, round, and reactive to light.  Neck: No tracheal deviation present. No thyromegaly present.  Cardiovascular: Normal rate.  Pulmonary/Chest: Effort normal.  Abdominal: Soft.  Neurological: She is alert and oriented to person, place, and time.  Skin: Skin is warm and dry.  Psychiatric: She has a normal mood and affect. Her behavior is normal.    Ortho Exam patient slow getting from sitting to standing she ambulates with hip flexed position she can ambulate briefly without her cane.  Patient has only 5 degrees internal rotation of the right hip which reproduces her severe groin pain.  External rotation 20  and again she winces and jumps.  Opposite hip as 30 degrees internal/external rotation she has some trochanteric bursal tenderness some sciatic notch tenderness and tenderness of the lumbar spine negative straight leg raising at 90 degrees.  Anterior tib gastrocsoleus is intact.  No plantar foot lesions.  Trace edema of the lower extremities.  Specialty Comments:  No specialty comments available.  Imaging: No results found.   PMFS History: Patient Active Problem List   Diagnosis Date Noted  . Prediabetes 05/26/2017  . Dysuria 11/03/2016  . Insomnia 11/03/2016  . Glaucoma 05/05/2016  . LPRD (laryngopharyngeal reflux disease) 01/26/2016  . Erosive esophagitis 11/27/2015  . Schatzki's ring of distal esophagus 11/27/2015  . Chronic sinusitis 10/16/2015  . Deviated nasal septum 10/16/2015  . Nasal polyp 10/16/2015  . Osteoporosis 08/15/2015  . Dysphagia, pharyngoesophageal phase 04/01/2015  . Endometrial polyp 10/24/2014  . Fatigue 07/09/2014  . Hyperlipidemia   . Breast cancer, right ([place]) 02/01/2014  . Malignant melanoma of arm ([place]) 11/23/2013  . Nasal septum ulceration 10/16/2013  . Seborrheic keratosis 10/16/2013  . Vitamin D deficiency 02/08/2013  . Sciatica of right side 01/24/2013  . Esophageal ulcer 01/24/2013  . Esophageal stenosis 01/24/2013  . Choroidal nevus of right eye 10/27/2012  . Impacted cerumen of right ear 10/11/2012  . Frequency 01/19/2012  . Gastroesophageal reflux disease 09/01/2011  . 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 H/O cataract extraction 04/30/2011  . Borderline glaucoma steroid responder 04/30/2011  . Obesity 10/16/2008  . Breast cancer of lower-outer quadrant of right female breast ([place]) 08/29/2008   Past Medical History:  Diagnosis Date  . Asymptomatic varicose veins   . Cancer (HCC)    breast   . Diverticulosis   . DM type 2 (diabetes mellitus, type 2) ([place]) 10/23/2014  .  Dysuria   . GERD (gastroesophageal reflux disease)   . Hiatal hernia   . Hyperlipidemia   . Malignant neoplasm of breast (female), unspecified site   . Migraine without aura, without mention of intractable migraine without mention of status migrainosus   . Other abnormal blood chemistry   . Pain in joint, pelvic region and thigh   . Shingles    in eyes  . Stricture and stenosis of esophagus   . Trigger finger (acquired)   . Unspecified cataract   . Unspecified glaucoma(365.9)     Family History  Problem Relation Age of Onset  . Hypertension Mother   . Hyperlipidemia Mother   . Alzheimer's disease Mother   . Emphysema Father   . COPD Father     Past Surgical History:  Procedure Laterality Date  . APPENDECTOMY  1973   Dr. Linzie Collin  . BREAST LUMPECTOMY Right 08/29/2008   Dr. Erroll Luna  . ESOPHAGEAL DILATION    . NASAL POLYP EXCISION  10/2015  . POLYPECTOMY  11/20/14   Uterine Polyp Removed   Social History   Occupational History  . Not on file  Tobacco Use  . Smoking status: Never Smoker  . Smokeless tobacco: Never Used  Substance and Sexual Activity  . Alcohol use: No  . Drug use: No  . Sexual activity: Not Currently

## 2018-04-17 DIAGNOSIS — H4041X2 Glaucoma secondary to eye inflammation, right eye, moderate stage: Secondary | ICD-10-CM | POA: Diagnosis not present

## 2018-04-17 DIAGNOSIS — Z8669 Personal history of other diseases of the nervous system and sense organs: Secondary | ICD-10-CM | POA: Diagnosis not present

## 2018-04-17 DIAGNOSIS — H209 Unspecified iridocyclitis: Secondary | ICD-10-CM | POA: Diagnosis not present

## 2018-04-17 DIAGNOSIS — H02401 Unspecified ptosis of right eyelid: Secondary | ICD-10-CM | POA: Diagnosis not present

## 2018-04-17 DIAGNOSIS — H527 Unspecified disorder of refraction: Secondary | ICD-10-CM | POA: Diagnosis not present

## 2018-04-17 DIAGNOSIS — D3131 Benign neoplasm of right choroid: Secondary | ICD-10-CM | POA: Diagnosis not present

## 2018-04-17 DIAGNOSIS — Z961 Presence of intraocular lens: Secondary | ICD-10-CM | POA: Diagnosis not present

## 2018-04-17 DIAGNOSIS — H04123 Dry eye syndrome of bilateral lacrimal glands: Secondary | ICD-10-CM | POA: Diagnosis not present

## 2018-04-17 DIAGNOSIS — B0051 Herpesviral iridocyclitis: Secondary | ICD-10-CM | POA: Diagnosis not present

## 2018-04-17 DIAGNOSIS — H25812 Combined forms of age-related cataract, left eye: Secondary | ICD-10-CM | POA: Diagnosis not present

## 2018-04-17 DIAGNOSIS — R768 Other specified abnormal immunological findings in serum: Secondary | ICD-10-CM | POA: Diagnosis not present

## 2018-04-19 DIAGNOSIS — M5440 Lumbago with sciatica, unspecified side: Secondary | ICD-10-CM | POA: Diagnosis not present

## 2018-04-19 DIAGNOSIS — M25551 Pain in right hip: Secondary | ICD-10-CM | POA: Diagnosis not present

## 2018-04-21 ENCOUNTER — Other Ambulatory Visit: Payer: Self-pay | Admitting: *Deleted

## 2018-04-21 DIAGNOSIS — M5431 Sciatica, right side: Secondary | ICD-10-CM

## 2018-04-21 MED ORDER — HYDROCODONE-ACETAMINOPHEN 10-325 MG PO TABS: 1.0000 | ORAL_TABLET | Freq: Four times a day (QID) | ORAL | 0 refills | Status: DC | PRN

## 2018-04-21 NOTE — Telephone Encounter (Signed)
Patient called and stated that she is having a lot of pain. Stated that she went and saw a Human resources officer at [facility] Wednesday. They set patient up for a MRI for 10/7 and Pet Scan on 10/8 at [facility].     Patient wants to know if you will increase her Hydrocodone 10-325mg  to One tablet every 6 hours for her pain.  [place] Verified LR: 03/22/2018 Pharmacy Confirmed Pended Rx and sent to Dr. Mariea Clonts for approval.

## 2018-04-21 NOTE — Telephone Encounter (Signed)
Yes we can.  Please remind her to avoid driving and try to get a ride if she is having to take this medication.

## 2018-04-24 NOTE — Telephone Encounter (Signed)
Patient notified and agreed.  

## 2018-04-26 ENCOUNTER — Ambulatory Visit: Payer: Medicare Other | Admitting: Podiatry

## 2018-04-28 ENCOUNTER — Telehealth: Payer: Self-pay

## 2018-04-28 NOTE — Telephone Encounter (Signed)
Patient called to request that provider sign a statement of competency for her insurance company so that she can sale one of her life insurance policies. She stated that it would be difficult for her to come in for an appointment next week due to pending appointments out of town and then having to go out of town for a funeral. Patient stated that she would bring form by the office on Monday 05/01/18 to see if it can be signed.    Please advise if form can be completed without an appointment.

## 2018-05-01 ENCOUNTER — Telehealth: Payer: Self-pay | Admitting: *Deleted

## 2018-05-01 DIAGNOSIS — M4726 Other spondylosis with radiculopathy, lumbar region: Secondary | ICD-10-CM | POA: Diagnosis not present

## 2018-05-01 DIAGNOSIS — M47816 Spondylosis without myelopathy or radiculopathy, lumbar region: Secondary | ICD-10-CM | POA: Diagnosis not present

## 2018-05-01 DIAGNOSIS — M6589 Other synovitis and tenosynovitis, multiple sites: Secondary | ICD-10-CM | POA: Diagnosis not present

## 2018-05-01 DIAGNOSIS — M48061 Spinal stenosis, lumbar region without neurogenic claudication: Secondary | ICD-10-CM | POA: Diagnosis not present

## 2018-05-01 DIAGNOSIS — S73191A Other sprain of right hip, initial encounter: Secondary | ICD-10-CM | POA: Diagnosis not present

## 2018-05-01 DIAGNOSIS — M5116 Intervertebral disc disorders with radiculopathy, lumbar region: Secondary | ICD-10-CM | POA: Diagnosis not present

## 2018-05-01 DIAGNOSIS — M4316 Spondylolisthesis, lumbar region: Secondary | ICD-10-CM | POA: Diagnosis not present

## 2018-05-01 DIAGNOSIS — M25451 Effusion, right hip: Secondary | ICD-10-CM | POA: Diagnosis not present

## 2018-05-01 DIAGNOSIS — M25452 Effusion, left hip: Secondary | ICD-10-CM | POA: Diagnosis not present

## 2018-05-01 DIAGNOSIS — M160 Bilateral primary osteoarthritis of hip: Secondary | ICD-10-CM | POA: Diagnosis not present

## 2018-05-01 DIAGNOSIS — S73192A Other sprain of left hip, initial encounter: Secondary | ICD-10-CM | POA: Diagnosis not present

## 2018-05-01 DIAGNOSIS — M5117 Intervertebral disc disorders with radiculopathy, lumbosacral region: Secondary | ICD-10-CM | POA: Diagnosis not present

## 2018-05-01 DIAGNOSIS — M659 Synovitis and tenosynovitis, unspecified: Secondary | ICD-10-CM | POA: Diagnosis not present

## 2018-05-01 DIAGNOSIS — M4727 Other spondylosis with radiculopathy, lumbosacral region: Secondary | ICD-10-CM | POA: Diagnosis not present

## 2018-05-01 DIAGNOSIS — M4317 Spondylolisthesis, lumbosacral region: Secondary | ICD-10-CM | POA: Diagnosis not present

## 2018-05-01 NOTE — Telephone Encounter (Signed)
I would think so if it's not a complicated form.  She's clearly competent.

## 2018-05-01 NOTE — Telephone Encounter (Signed)
I left a message asking that patient drop the form she needs completed by the office today. I asked that she call the office if she has any questions.

## 2018-05-01 NOTE — Telephone Encounter (Signed)
Form, Letter of Competency,  placed in Dr. Cyndi Lennert folder to review.  To call patient [phone] or cell#:[phone] when completed.   [place] #:[phone] Fax: [phone]

## 2018-05-01 NOTE — Telephone Encounter (Signed)
Received Paperwork for [name]. Patient stated that she needs paperwork filled out today due to going to Dr. Marylene Buerger and going out of town on Wednesday for brother's funeral.

## 2018-05-02 DIAGNOSIS — Z85820 Personal history of malignant melanoma of skin: Secondary | ICD-10-CM | POA: Diagnosis not present

## 2018-05-02 DIAGNOSIS — C4361 Malignant melanoma of right upper limb, including shoulder: Secondary | ICD-10-CM | POA: Diagnosis not present

## 2018-05-02 DIAGNOSIS — R937 Abnormal findings on diagnostic imaging of other parts of musculoskeletal system: Secondary | ICD-10-CM | POA: Diagnosis not present

## 2018-05-08 ENCOUNTER — Other Ambulatory Visit: Payer: Self-pay | Admitting: Internal Medicine

## 2018-05-08 DIAGNOSIS — M48062 Spinal stenosis, lumbar region with neurogenic claudication: Secondary | ICD-10-CM

## 2018-05-10 DIAGNOSIS — M160 Bilateral primary osteoarthritis of hip: Secondary | ICD-10-CM | POA: Diagnosis not present

## 2018-05-10 DIAGNOSIS — M48061 Spinal stenosis, lumbar region without neurogenic claudication: Secondary | ICD-10-CM | POA: Diagnosis not present

## 2018-05-10 DIAGNOSIS — M25551 Pain in right hip: Secondary | ICD-10-CM | POA: Diagnosis not present

## 2018-05-11 DIAGNOSIS — M48061 Spinal stenosis, lumbar region without neurogenic claudication: Secondary | ICD-10-CM | POA: Insufficient documentation

## 2018-05-16 ENCOUNTER — Ambulatory Visit (INDEPENDENT_AMBULATORY_CARE_PROVIDER_SITE_OTHER): Payer: Medicare Other | Admitting: Podiatry

## 2018-05-16 ENCOUNTER — Encounter: Payer: Self-pay | Admitting: Podiatry

## 2018-05-16 DIAGNOSIS — M79674 Pain in right toe(s): Secondary | ICD-10-CM

## 2018-05-16 DIAGNOSIS — M79675 Pain in left toe(s): Secondary | ICD-10-CM | POA: Diagnosis not present

## 2018-05-16 DIAGNOSIS — B351 Tinea unguium: Secondary | ICD-10-CM

## 2018-05-16 NOTE — Progress Notes (Signed)
Complaint:  Visit Type: Patient returns to my office for continued preventative foot care services. Complaint: Patient states" my third toenails both feet  have grown long and thick and become painful to walk and wear shoes"  The patient presents for preventative foot care services. No changes to ROS  Podiatric Exam: Vascular: dorsalis pedis and posterior tibial pulses are palpable bilateral. Capillary return is immediate. Temperature gradient is WNL. Skin turgor WNL  Sensorium: Normal Semmes Weinstein monofilament test. Normal tactile sensation bilaterally. Nail Exam: Pt has thick disfigured discolored nails with subungual debris noted third toenails both feet. Ulcer Exam: There is no evidence of ulcer or pre-ulcerative changes or infection. Orthopedic Exam: Muscle tone and strength are WNL. No limitations in general ROM. No crepitus or effusions noted. Foot type and digits show no abnormalities. Bony prominences are unremarkable. Skin: No Porokeratosis. No infection or ulcers  Diagnosis:  Onychomycosis, , Pain in right toe, pain in left toes  Treatment & Plan Procedures and Treatment: Consent by patient was obtained for treatment procedures.   Debridement of mycotic and hypertrophic toenails, 1 through 5 bilateral and clearing of subungual debris. No ulceration, no infection noted.  Return Visit-Office Procedure: Patient instructed to return to the office for a follow up visit 3 months for continued evaluation and treatment.    Gabrian Hoque DPM 

## 2018-05-23 DIAGNOSIS — S73102A Unspecified sprain of left hip, initial encounter: Secondary | ICD-10-CM | POA: Diagnosis not present

## 2018-05-23 DIAGNOSIS — Z9229 Personal history of other drug therapy: Secondary | ICD-10-CM | POA: Diagnosis not present

## 2018-05-23 DIAGNOSIS — M810 Age-related osteoporosis without current pathological fracture: Secondary | ICD-10-CM | POA: Diagnosis not present

## 2018-05-23 DIAGNOSIS — Z6379 Other stressful life events affecting family and household: Secondary | ICD-10-CM | POA: Diagnosis not present

## 2018-05-23 DIAGNOSIS — Z91048 Other nonmedicinal substance allergy status: Secondary | ICD-10-CM | POA: Diagnosis not present

## 2018-05-23 DIAGNOSIS — S73101A Unspecified sprain of right hip, initial encounter: Secondary | ICD-10-CM | POA: Diagnosis not present

## 2018-05-23 DIAGNOSIS — M353 Polymyalgia rheumatica: Secondary | ICD-10-CM | POA: Diagnosis not present

## 2018-05-23 DIAGNOSIS — M6588 Other synovitis and tenosynovitis, other site: Secondary | ICD-10-CM | POA: Diagnosis not present

## 2018-05-23 DIAGNOSIS — G4700 Insomnia, unspecified: Secondary | ICD-10-CM | POA: Diagnosis not present

## 2018-05-23 DIAGNOSIS — M4800 Spinal stenosis, site unspecified: Secondary | ICD-10-CM | POA: Diagnosis not present

## 2018-05-23 DIAGNOSIS — Z7952 Long term (current) use of systemic steroids: Secondary | ICD-10-CM | POA: Diagnosis not present

## 2018-05-25 ENCOUNTER — Other Ambulatory Visit: Payer: Self-pay | Admitting: *Deleted

## 2018-05-25 DIAGNOSIS — M5431 Sciatica, right side: Secondary | ICD-10-CM

## 2018-05-25 MED ORDER — HYDROCODONE-ACETAMINOPHEN 10-325 MG PO TABS: 1.0000 | ORAL_TABLET | Freq: Three times a day (TID) | ORAL | 0 refills | Status: DC | PRN

## 2018-05-25 NOTE — Telephone Encounter (Signed)
Patient requested refill [place] Verified LR: 04/21/2018 Pended Rx and sent to Dr. Mariea Clonts for approval.

## 2018-05-25 NOTE — Telephone Encounter (Signed)
Number of tablets reduced at recommendation of her specialist notes

## 2018-05-26 ENCOUNTER — Other Ambulatory Visit: Payer: Self-pay | Admitting: Internal Medicine

## 2018-05-26 NOTE — Telephone Encounter (Signed)
A medication refill was received from pharmacy for lorazepam 1 mg. Rx was pended to provider for approval  after verifying last fill date, provider, and quantity on [place] database.    Pharmacy needs diagnosis codes for medication.

## 2018-05-31 DIAGNOSIS — Z23 Encounter for immunization: Secondary | ICD-10-CM | POA: Diagnosis not present

## 2018-06-01 DIAGNOSIS — H43393 Other vitreous opacities, bilateral: Secondary | ICD-10-CM | POA: Diagnosis not present

## 2018-06-01 DIAGNOSIS — Z91041 Radiographic dye allergy status: Secondary | ICD-10-CM | POA: Diagnosis not present

## 2018-06-01 DIAGNOSIS — Z9841 Cataract extraction status, right eye: Secondary | ICD-10-CM | POA: Diagnosis not present

## 2018-06-01 DIAGNOSIS — Z91048 Other nonmedicinal substance allergy status: Secondary | ICD-10-CM | POA: Diagnosis not present

## 2018-06-01 DIAGNOSIS — H209 Unspecified iridocyclitis: Secondary | ICD-10-CM | POA: Diagnosis not present

## 2018-06-01 DIAGNOSIS — C7989 Secondary malignant neoplasm of other specified sites: Secondary | ICD-10-CM | POA: Diagnosis not present

## 2018-06-01 DIAGNOSIS — H527 Unspecified disorder of refraction: Secondary | ICD-10-CM | POA: Diagnosis not present

## 2018-06-01 DIAGNOSIS — Z961 Presence of intraocular lens: Secondary | ICD-10-CM | POA: Diagnosis not present

## 2018-06-01 DIAGNOSIS — Z880 Allergy status to penicillin: Secondary | ICD-10-CM | POA: Diagnosis not present

## 2018-06-01 DIAGNOSIS — H04123 Dry eye syndrome of bilateral lacrimal glands: Secondary | ICD-10-CM | POA: Diagnosis not present

## 2018-06-01 DIAGNOSIS — H02401 Unspecified ptosis of right eyelid: Secondary | ICD-10-CM | POA: Diagnosis not present

## 2018-06-01 DIAGNOSIS — H4041X2 Glaucoma secondary to eye inflammation, right eye, moderate stage: Secondary | ICD-10-CM | POA: Diagnosis not present

## 2018-06-01 DIAGNOSIS — D3131 Benign neoplasm of right choroid: Secondary | ICD-10-CM | POA: Diagnosis not present

## 2018-06-01 DIAGNOSIS — Z888 Allergy status to other drugs, medicaments and biological substances status: Secondary | ICD-10-CM | POA: Diagnosis not present

## 2018-06-01 DIAGNOSIS — H25812 Combined forms of age-related cataract, left eye: Secondary | ICD-10-CM | POA: Diagnosis not present

## 2018-06-05 ENCOUNTER — Ambulatory Visit (INDEPENDENT_AMBULATORY_CARE_PROVIDER_SITE_OTHER): Payer: Medicare Other | Admitting: Internal Medicine

## 2018-06-05 ENCOUNTER — Encounter: Payer: Self-pay | Admitting: Internal Medicine

## 2018-06-05 VITALS — BP 122/72 | HR 85 | Temp 98.6°F | Ht 64.0 in | Wt 242.0 lb

## 2018-06-05 DIAGNOSIS — M353 Polymyalgia rheumatica: Secondary | ICD-10-CM | POA: Diagnosis not present

## 2018-06-05 DIAGNOSIS — R7303 Prediabetes: Secondary | ICD-10-CM | POA: Diagnosis not present

## 2018-06-05 DIAGNOSIS — C4361 Malignant melanoma of right upper limb, including shoulder: Secondary | ICD-10-CM

## 2018-06-05 DIAGNOSIS — M48062 Spinal stenosis, lumbar region with neurogenic claudication: Secondary | ICD-10-CM | POA: Diagnosis not present

## 2018-06-05 DIAGNOSIS — R1031 Right lower quadrant pain: Secondary | ICD-10-CM

## 2018-06-05 DIAGNOSIS — Z6841 Body Mass Index (BMI) 40.0 and over, adult: Secondary | ICD-10-CM | POA: Diagnosis not present

## 2018-06-05 DIAGNOSIS — E66813 Obesity, class 3: Secondary | ICD-10-CM

## 2018-06-05 NOTE — Progress Notes (Signed)
Location:  [facility] clinic Provider:  Orrie Lascano L. Mariea Clonts, D.O., C.M.D.  Goals of Care:  Advanced Directives 02/02/2018  Does Patient Have a Medical Advance Directive? No  Would patient like information on creating a medical advance directive? -     Chief Complaint  Patient presents with  . Medical Management of Chronic Issues    48mth follow-up    HPI: Patient is a 72 y.o. female seen today for medical management of chronic diseases.    Reviewed notes from her specialists since I last saw her.  Ortho recommended her hydrocodone dose be reduced which I did when we last renewed.  She says this was not discussed there.    She's achy really bad for 3 days after having the flu shot last week.  Also it's been much colder recently.  Left wrist, then both wrists, then knees, and then down her leg and generally all over.    She has a shot in her hip planned 11/19.  Then it started to hurt in her lower back/SI region on the left.  Says she misses her active life.  Can't even stand to do dishes anymore.  Says she can't walk to go to dogs at [place] like she used to with her son.  Staying on 10mg  prednisone for PMR.  PT and dry needling helped with her thigh and she had an epidural that helped.    Her brother passed away 5 weeks ago unexpectedly.  He's had back problems for years.    She went for a second opinion here in [place].  Told him about how it's been hurting her in her right groin. He thought she needed a hip replacement.  She also had MRI hip and pelvis both that were negative for metastatic disease (at [facility]).  She has used icy hot and now got biofreeze.    She did not qualify for a diabetes class b/c she was prediabetic only.  She could not go to the prediabetes class yet b/c of her brother's death.  She is going to attend the next class on Monday for this.  She is not a junk eater--not a cake and pie person.  If she does get her hands on those things, she will eat them though.  Would rather fill  up on healthy things.  Being so immobile is not helping.  Had a headache over her right eye, but it went away.  Eye pressure was good last time.    Past Medical History:  Diagnosis Date  . Asymptomatic varicose veins   . Cancer (HCC)    breast   . Diverticulosis   . DM type 2 (diabetes mellitus, type 2) ([place]) 10/23/2014  . Dysuria   . GERD (gastroesophageal reflux disease)   . Hiatal hernia   . Hyperlipidemia   . Malignant neoplasm of breast (female), unspecified site   . Migraine without aura, without mention of intractable migraine without mention of status migrainosus   . Other abnormal blood chemistry   . Pain in joint, pelvic region and thigh   . Shingles    in eyes  . Stricture and stenosis of esophagus   . Trigger finger (acquired)   . Unspecified cataract   . Unspecified glaucoma(365.9)     Past Surgical History:  Procedure Laterality Date  . APPENDECTOMY  1973   Dr. Linzie Collin  . BREAST LUMPECTOMY Right 08/29/2008   Dr. Erroll Luna  . ESOPHAGEAL DILATION    . NASAL POLYP EXCISION  10/2015  .  POLYPECTOMY  11/20/14   Uterine Polyp Removed    Allergies  Allergen Reactions  . Adhesive [Tape] Dermatitis  . Betimol [Timolol Maleate]     Allergic to preservatives   . Cosopt [Dorzolamide Hcl-Timolol Mal] Other (See Comments)    Turns eye red, increases eye pressure  . Penicillins   . Prednisolone Acetate Other (See Comments)    Caused increased eye pressure  . Tamoxifen Other (See Comments)    Vaginal bleeding  . Thimerosal     Makes pt eye red     Outpatient Encounter Medications as of 06/05/2018  Medication Sig  . ARTIFICIAL TEAR OP Place 1 % into the right eye daily.   . Ca Cit-Vit D-Vit K-Minerals (ADVANCED CALCIUM FORMULA) 200 MG TABS daily.  . Calcium Citrate-Vitamin D 250-200 MG-UNIT TABS Take 250 mg by mouth 2 (two) times daily.  . Cholecalciferol (VITAMIN D-3) 5000 units TABS Take by mouth daily.  . Cyanocobalamin (B-12) 5000 MCG CAPS Take by mouth  daily.  . fluorometholone (FML) 0.1 % ophthalmic suspension Place 1 drop into the right eye daily.  Marland Kitchen HYDROcodone-acetaminophen (NORCO) 10-325 MG tablet Take 1 tablet by mouth every 8 (eight) hours as needed for severe pain.  Marland Kitchen LORazepam (ATIVAN) 1 MG tablet Take 0.5 tablets (0.5 mg total) by mouth at bedtime as needed for anxiety or sleep.  Marland Kitchen omeprazole (PRILOSEC) 40 MG capsule Take 40 mg by mouth.  . predniSONE (DELTASONE) 5 MG tablet Take 10 mg by mouth daily with breakfast.  . timolol (TIMOPTIC) 0.5 % ophthalmic solution PLACE 1 DROP INTO THE RIGHT EYE 2 TIMES DAILY.  . valACYclovir (VALTREX) 1000 MG tablet Take 1,000 mg by mouth daily.   . [DISCONTINUED] cyclobenzaprine (FLEXERIL) 10 MG tablet TAKE 1 TABLET BY MOUTH TWICE A DAY  . [DISCONTINUED] predniSONE (DELTASONE) 5 MG tablet Take 2.5 tablets (12.5 mg total) by mouth daily with breakfast. (Patient taking differently: Take 10 mg by mouth daily with breakfast. )   No facility-administered encounter medications on file as of 06/05/2018.     Review of Systems:  Review of Systems  Constitutional: Negative for chills and fever.  HENT: Negative for congestion.   Eyes: Negative for blurred vision.  Respiratory: Negative for shortness of breath.   Cardiovascular: Negative for chest pain and palpitations.  Gastrointestinal: Negative for abdominal pain, blood in stool, constipation, diarrhea, heartburn and melena.  Genitourinary: Negative for dysuria.  Musculoskeletal: Positive for back pain and joint pain. Negative for falls.  Skin: Negative for itching and rash.  Neurological: Negative for dizziness and loss of consciousness.  Endo/Heme/Allergies: Does not bruise/bleed easily.  Psychiatric/Behavioral: Negative for memory loss.       Tearful, brother just passed away     Health Maintenance  Topic Date Due  . Hepatitis C Screening  1946/02/24  . URINE MICROALBUMIN  07/03/1956  . COLONOSCOPY  03/26/2019  . TETANUS/TDAP  06/25/2019    . MAMMOGRAM  04/14/2020  . INFLUENZA VACCINE  Completed  . DEXA SCAN  Completed  . PNA vac Low Risk Adult  Completed    Physical Exam: Vitals:   06/05/18 1304  BP: 122/72  Pulse: 85  Temp: 98.6 F (37 C)  TempSrc: Oral  SpO2: 96%  Weight: 242 lb (109.8 kg)  Height: 5\' 4"  (1.626 m)   Body mass index is 41.54 kg/m. Physical Exam  Constitutional: She is oriented to person, place, and time. She appears well-developed and well-nourished. No distress.  HENT:  Head: Normocephalic and atraumatic.  Cardiovascular: Normal rate, regular rhythm, normal heart sounds and intact distal pulses.  Pulmonary/Chest: Effort normal and breath sounds normal. No respiratory distress. She has no wheezes.  Abdominal: Soft. Bowel sounds are normal.  Musculoskeletal: Normal range of motion.  Has to pull her right foot back to stand or the pain shoots through her right groin and she cannot stand even with her cane  Neurological: She is alert and oriented to person, place, and time.  Skin: Skin is warm and dry. Capillary refill takes less than 2 seconds.  Psychiatric: She has a normal mood and affect.    Labs reviewed: in care everywhere Basic Metabolic Panel: No results for input(s): NA, K, CL, CO2, GLUCOSE, BUN, CREATININE, CALCIUM, MG, PHOS, TSH in the last 8760 hours. Liver Function Tests: No results for input(s): AST, ALT, ALKPHOS, BILITOT, PROT, ALBUMIN in the last 8760 hours. No results for input(s): LIPASE, AMYLASE in the last 8760 hours. No results for input(s): AMMONIA in the last 8760 hours. CBC: No results for input(s): WBC, NEUTROABS, HGB, HCT, MCV, PLT in the last 8760 hours. Lipid Panel: No results for input(s): CHOL, HDL, LDLCALC, TRIG, CHOLHDL, LDLDIRECT in the last 8760 hours. Lab Results  Component Value Date   HGBA1C 5.9 (H) 02/02/2018    Assessment/Plan 1. Right groin pain -has upcoming injection in hopes this will give her relief--per [facility] -ortho here  recommended hip replacement, but xray showed mild OA  2. Spinal stenosis of lumbar region with neurogenic claudication -improved considerably on left except now right groin pain, and some sacroiliac pain left -down to three hydrocodone per day--wants to take 4 until her injection, but will run out early if she does--counseled about STOP act, etc.  3. Malignant melanoma of right upper extremity ([place]) -in remission, monitored with MRIs by [facility]  4. Polymyalgia rheumatica (HCC) -continues on prednisone for this--initially had tremendous improvement with this  5. Prediabetes - is for nutrition class Monday and given a bunch of nutritional information - Hemoglobin A1c  6. Class 3 severe obesity due to excess calories without serious comorbidity with body mass index (BMI) of 40.0 to 44.9 in adult [facility]) -ongoing, counseling about diet done - Hemoglobin A1c  7. BMI 40.0-44.9, adult ([place]) - counseled on need for weight loss to help back and joints - Hemoglobin A1c  Labs/tests ordered:   Orders Placed This Encounter  Procedures  . HM MAMMOGRAPHY    This external order was created through the Results Console.  Marland Kitchen HM DEXA SCAN    This external order was created through the Results Console.  . Hemoglobin A1c   Next appt:  4 mos med mgt  Johnda Billiot L. Basilia Stuckert, D.O. [facility] [address], [place] [address] Cell Phone (Mon-Fri 8am-5pm):  [phone] On Call:  [phone] & follow prompts after 5pm & weekends Office Phone:  [phone] Office Fax:  [phone]

## 2018-06-05 NOTE — Patient Instructions (Signed)

## 2018-06-06 LAB — HEMOGLOBIN A1C
Hgb A1c MFr Bld: 6.1 % of total Hgb — ABNORMAL HIGH (ref <5.7)
Mean Plasma Glucose: 128 (calc)
eAG (mmol/L): 7.1 (calc)

## 2018-06-13 DIAGNOSIS — M47896 Other spondylosis, lumbar region: Secondary | ICD-10-CM | POA: Diagnosis not present

## 2018-06-13 DIAGNOSIS — E119 Type 2 diabetes mellitus without complications: Secondary | ICD-10-CM | POA: Diagnosis not present

## 2018-06-13 DIAGNOSIS — M5136 Other intervertebral disc degeneration, lumbar region: Secondary | ICD-10-CM | POA: Diagnosis not present

## 2018-06-13 DIAGNOSIS — M160 Bilateral primary osteoarthritis of hip: Secondary | ICD-10-CM | POA: Diagnosis not present

## 2018-06-20 ENCOUNTER — Other Ambulatory Visit: Payer: Self-pay | Admitting: *Deleted

## 2018-06-20 DIAGNOSIS — M5431 Sciatica, right side: Secondary | ICD-10-CM

## 2018-06-20 MED ORDER — HYDROCODONE-ACETAMINOPHEN 10-325 MG PO TABS: 1.0000 | ORAL_TABLET | Freq: Three times a day (TID) | ORAL | 0 refills | Status: DC | PRN

## 2018-06-20 NOTE — Telephone Encounter (Signed)
Patient requested refill NCCSRS Database Verified LR: 05/25/2018 Pended Rx and sent to Dr. Mariea Clonts for approval.

## 2018-06-27 DIAGNOSIS — M25519 Pain in unspecified shoulder: Secondary | ICD-10-CM | POA: Diagnosis not present

## 2018-06-27 DIAGNOSIS — Z85820 Personal history of malignant melanoma of skin: Secondary | ICD-10-CM | POA: Diagnosis not present

## 2018-06-27 DIAGNOSIS — C799 Secondary malignant neoplasm of unspecified site: Secondary | ICD-10-CM | POA: Diagnosis not present

## 2018-06-27 DIAGNOSIS — Z8709 Personal history of other diseases of the respiratory system: Secondary | ICD-10-CM | POA: Diagnosis not present

## 2018-06-27 DIAGNOSIS — C4361 Malignant melanoma of right upper limb, including shoulder: Secondary | ICD-10-CM | POA: Diagnosis not present

## 2018-06-27 DIAGNOSIS — R739 Hyperglycemia, unspecified: Secondary | ICD-10-CM | POA: Diagnosis not present

## 2018-06-27 DIAGNOSIS — R1030 Lower abdominal pain, unspecified: Secondary | ICD-10-CM | POA: Diagnosis not present

## 2018-06-27 DIAGNOSIS — Z853 Personal history of malignant neoplasm of breast: Secondary | ICD-10-CM | POA: Diagnosis not present

## 2018-06-27 DIAGNOSIS — C50911 Malignant neoplasm of unspecified site of right female breast: Secondary | ICD-10-CM | POA: Diagnosis not present

## 2018-06-27 DIAGNOSIS — Z9221 Personal history of antineoplastic chemotherapy: Secondary | ICD-10-CM | POA: Diagnosis not present

## 2018-06-27 DIAGNOSIS — G4700 Insomnia, unspecified: Secondary | ICD-10-CM | POA: Diagnosis not present

## 2018-06-27 DIAGNOSIS — M545 Low back pain: Secondary | ICD-10-CM | POA: Diagnosis not present

## 2018-06-27 DIAGNOSIS — Z08 Encounter for follow-up examination after completed treatment for malignant neoplasm: Secondary | ICD-10-CM | POA: Diagnosis not present

## 2018-07-03 ENCOUNTER — Encounter: Payer: Self-pay | Admitting: Registered"

## 2018-07-03 ENCOUNTER — Encounter: Payer: Medicare Other | Attending: Internal Medicine | Admitting: Registered"

## 2018-07-03 DIAGNOSIS — R7303 Prediabetes: Secondary | ICD-10-CM | POA: Insufficient documentation

## 2018-07-03 NOTE — Progress Notes (Signed)
Patient was seen on 07/03/18 for the Core Session 1 of Diabetes Prevention Program course at [facility]. The following learning objectives were met by the patient during this class:   Learning Objectives:   Be able to explain the purpose and benefits of the National Diabetes Prevention Program.   Be able to describe the events that will take place at every session.   Know the weight loss and physical activity goals established by the [facility] Diabetes Prevention Program.   Know their own individual weight loss and physical activity goals.   Be able to explain the important effect of self-monitoring on behavior change.   Goals:   Record food and beverage intake in "Food and Activity Tracker" over the next week.   Bring completed "Food and Activity Tracker" for session 1 to session 2 next week.  Circle the foods or beverages you think are highest in fat and calories in your food tracker.  Read the labels on the food you buy, and consider using measuring cups and spoons to help you calculate the amount you eat. We will talk about measuring in more detail in the coming weeks.   Follow-Up Plan:  Attend Core Session 2 next week.   Bring completed "Food and Activity Tracker" next week to be reviewed by Lifestyle Coach.

## 2018-07-10 ENCOUNTER — Encounter: Payer: Self-pay | Admitting: Registered"

## 2018-07-10 ENCOUNTER — Encounter (HOSPITAL_BASED_OUTPATIENT_CLINIC_OR_DEPARTMENT_OTHER): Payer: Medicare Other | Admitting: Registered"

## 2018-07-10 DIAGNOSIS — R7303 Prediabetes: Secondary | ICD-10-CM | POA: Diagnosis not present

## 2018-07-10 NOTE — Progress Notes (Signed)
Patient was seen on 07/10/18 for the Core Session 2 of Diabetes Prevention Program course at [facility]. By the end of this session patients are able to complete the following objectives:   Learning Objectives:  Self-monitor their weight during the weeks following Session 2.   Describe the relationship between fat and calories.   Explain the reason for, and basic principles of, self-monitoring fat grams and calories.   Identify their personal fat gram goals.   Use the ?Fat and Calorie Counter to calculate the calories and fat grams of a given selection of foods.   Keep a running total of the fat grams they eat each day.   Calculate fat, calories, and serving sizes from nutrition labels.   Goals:   Weigh yourself at the same time each day, or every few days, and record your weight in your Food and Activity Tracker.  Write down everything you eat and drink in your Food and Activity Tracker.  Measure portions as much as you can, and start reading labels.   Use the ?Fat and Calorie Counter to figure out the amount of fat and calories in what you ate, and write the amount down in your Food and Activity Tracker.  Keep a running fat gram total throughout the day. Come as close to your fat gram goal as you can.   Follow-Up Plan:  Attend Core Session 3 next week.   Bring completed "Food and Activity Tracker" next week to be reviewed by Lifestyle Coach.

## 2018-07-17 ENCOUNTER — Encounter (HOSPITAL_BASED_OUTPATIENT_CLINIC_OR_DEPARTMENT_OTHER): Payer: Medicare Other | Admitting: Registered"

## 2018-07-17 ENCOUNTER — Encounter: Payer: Self-pay | Admitting: Registered"

## 2018-07-17 DIAGNOSIS — R7303 Prediabetes: Secondary | ICD-10-CM

## 2018-07-17 NOTE — Progress Notes (Signed)
Patient was seen on 07/17/18 for the Core Session 3 of Diabetes Prevention Program course at [facility]. By the end of this session patients are able to complete the following objectives:   Learning Objectives:  Weigh and measure foods.  Estimate the fat and calorie content of common foods.  Describe three ways to eat less fat and fewer calories.  Create a plan to eat less fat for the following week.   Goals:   Track weight when weighing outside of class.   Track food and beverages eaten each day in Food and Activity Tracker and include fat grams and calories for each.   Try to stay within fat gram goal.   Complete plan for eating less high fat foods and answer related homework questions.    Follow-Up Plan:  Attend Core Session 4 next week.   Bring completed "Food and Activity Tracker" next week to be reviewed by Lifestyle Coach.

## 2018-07-21 ENCOUNTER — Other Ambulatory Visit: Payer: Self-pay | Admitting: *Deleted

## 2018-07-21 DIAGNOSIS — M5431 Sciatica, right side: Secondary | ICD-10-CM

## 2018-07-21 NOTE — Telephone Encounter (Signed)
Patient requested refill NCCSRS Database Verified LR: 06/24/2018 Pended Rx and sent to [facility] for approval. Covering for Dr. Mariea Clonts.

## 2018-07-24 ENCOUNTER — Encounter (HOSPITAL_BASED_OUTPATIENT_CLINIC_OR_DEPARTMENT_OTHER): Payer: Medicare Other | Admitting: Registered"

## 2018-07-24 ENCOUNTER — Encounter: Payer: Self-pay | Admitting: Registered"

## 2018-07-24 DIAGNOSIS — R7303 Prediabetes: Secondary | ICD-10-CM

## 2018-07-24 NOTE — Progress Notes (Signed)
Patient was seen on 07/24/18 for the Core Session 4 of Diabetes Prevention Program course at [facility]. By the end of this session patients are able to complete the following objectives:   Learning Objectives:  Explain the health benefits of eating less fat and fewer calories.  Describe the MyPlate food guide and its recommendations, including how to reduce fat and calories in our diet.  Compare and contrast MyPlate guidelines with participants' eating habits.  List ways to replace high-fat and high-calorie foods with low-fat and low-calorie foods.  Explain the importance of eating plenty of whole grains, vegetables, and fruits, while staying within fat gram goals.  Explain the importance of eating foods from all groups of MyPlate and of eating a variety of foods from within each group.  Explain why a balanced diet is beneficial to health.  Explain why eating the same foods over and over is not the best strategy for long-term success.   Goals:   Record weight taken outside of class.   Track foods and beverages eaten each day in the "Food and Activity Tracker," including calories and fat grams for each item.   Practice comparing what you eat with the recommendations of MyPlate using the "Rate Your Plate" handout.   Complete the "Rate Your Plate" handout form on at least 3 days.   Answer homework questions.   Follow-Up Plan:  Attend Core Session 5 next week.   Bring completed "Food and Activity Tracker" next week to be reviewed by Lifestyle Coach.

## 2018-07-25 MED ORDER — HYDROCODONE-ACETAMINOPHEN 10-325 MG PO TABS: 1.0000 | ORAL_TABLET | Freq: Three times a day (TID) | ORAL | 0 refills | Status: DC | PRN

## 2018-07-25 NOTE — Telephone Encounter (Signed)
Patient informed rx sent in. Patient states the pharmacy told her medication was on back order and may be in by Friday 07/28/2018. If [facility] not in [facility] [address] by Friday patient was told she could pick-up at another location ([facility] [address]). Patient will need [facility] to [facility] [address] to confirm [facility] transfer per pharmacist at [employer]. Patient will call Friday if needed

## 2018-07-25 NOTE — Telephone Encounter (Signed)
Patient called upset that her RX was never responded to.  It appears RX request was sent to [facility] and as of today 07/25/18 @ 11:23am message still not responded to. I apologized to patient for this delay and informed her Dr.Reed was out of office and RX was sent to a covering provider that unfortunately is not too familiar with this process and I will now forward to Dr.Reed to approve.  Patient verbalized understanding and hopes this will not be a continual concerns.

## 2018-07-31 ENCOUNTER — Encounter: Payer: Medicare Other | Attending: Internal Medicine | Admitting: Registered"

## 2018-07-31 ENCOUNTER — Encounter: Payer: Self-pay | Admitting: Registered"

## 2018-07-31 DIAGNOSIS — R7303 Prediabetes: Secondary | ICD-10-CM | POA: Insufficient documentation

## 2018-07-31 NOTE — Progress Notes (Signed)
Patient was seen on 07/31/18 for the Core Session 5 of Diabetes Prevention Program course at [facility]. By the end of this session patients are able to complete the following objectives:   Learning Objectives:  Establish a physical activity goal.  Explain the importance of the physical activity goal.  Describe their current level of physical activity.  Name ways that they are already physically active.  Develop personal plans for physical activity for the next week.   Goals:   Record weight taken outside of class.   Track foods and beverages eaten each day in the "Food and Activity Tracker," including calories and fat grams for each item.   Make an Activity Plan including date, specific type of activity, and length of time you plan to be active that includes at last 60 minutes of activity for the week.   Track activity type, minutes you were active, and distance you reached each day in the "Food and Activity Tracker."   Follow-Up Plan:  Attend Core Session 6 next week.   Bring completed "Food and Activity Tracker" next week to be reviewed by Lifestyle Coach.

## 2018-08-07 ENCOUNTER — Encounter: Payer: Self-pay | Admitting: Registered"

## 2018-08-07 ENCOUNTER — Encounter (HOSPITAL_BASED_OUTPATIENT_CLINIC_OR_DEPARTMENT_OTHER): Payer: Medicare Other | Admitting: Registered"

## 2018-08-07 DIAGNOSIS — R7303 Prediabetes: Secondary | ICD-10-CM

## 2018-08-07 NOTE — Progress Notes (Signed)
Patient was seen on 08/07/18 for the Core Session 6 of Diabetes Prevention Program course at [facility]. By the end of this session patients are able to complete the following objectives:   Learning Objectives:  Graph their daily physical activity.   Describe two ways of finding the time to be active.   Define "lifestyle activity."   Describe how to prevent injury.   Develop an activity plan for the coming week.   Goals:   Record weight taken outside of class.   Track foods and beverages eaten each day in the "Food and Activity Tracker," including calories and fat grams for each item.    Track activity type, minutes you were active, and distance you reached each day in the "Food and Activity Tracker."   Set aside one 20 to 30-minute block of time every day or find two or more periods of 10 to15 minutes each for physical activity.   Warm up, cool down, and stretch.  Make a Physical Activities Plan for the Week.   Follow-Up Plan:  Attend Core Session 7 next week.   Bring completed "Food and Activity Tracker" next week to be reviewed by Lifestyle Coach.

## 2018-08-11 ENCOUNTER — Encounter: Payer: Self-pay | Admitting: Adult Health

## 2018-08-11 ENCOUNTER — Ambulatory Visit (INDEPENDENT_AMBULATORY_CARE_PROVIDER_SITE_OTHER): Payer: Medicare Other | Admitting: Adult Health

## 2018-08-11 VITALS — BP 134/84 | HR 75 | Temp 98.2°F | Ht 64.0 in | Wt 243.6 lb

## 2018-08-11 DIAGNOSIS — H65191 Other acute nonsuppurative otitis media, right ear: Secondary | ICD-10-CM

## 2018-08-11 MED ORDER — DOXYCYCLINE HYCLATE 100 MG PO TABS: 100.0000 mg | ORAL_TABLET | Freq: Two times a day (BID) | ORAL | 0 refills | Status: AC

## 2018-08-11 MED ORDER — SACCHAROMYCES BOULARDII 250 MG PO CAPS: 250.0000 mg | ORAL_CAPSULE | Freq: Two times a day (BID) | ORAL | 0 refills | Status: DC

## 2018-08-11 NOTE — Progress Notes (Signed)
[facility]  Provider:   Durenda Age - FNP   Goals of Care:  Advanced Directives 02/02/2018  Does Patient Have a Medical Advance Directive? No  Would patient like information on creating a medical advance directive? -     Chief Complaint  Patient presents with  . Acute Visit    Right-sided headache, ear congestion    HPI: Patient is a 73 y.o. female seen today for an acute visit for right-sided headache and ear congestion. She complained that the back of her right neck. She has history of shingles on the right side of her face. Four days ago, she started using generic Valacyclovir. 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 She was noted to have erythema on her right ear. No ear drainage was noted. She reported using Q tips to clean her ears occasionally. No reported fever.   Past Medical History:  Diagnosis Date  . Asymptomatic varicose veins   . Cancer (HCC)    breast   . Diverticulosis   . DM type 2 (diabetes mellitus, type 2) ([place]) 10/23/2014  . Dysuria   . GERD (gastroesophageal reflux disease)   . Hiatal hernia   . Hyperlipidemia   . Malignant neoplasm of breast (female), unspecified site   . Migraine without aura, without mention of intractable migraine without mention of status migrainosus   . Other abnormal blood chemistry   . Pain in joint, pelvic region and thigh   . Shingles    in eyes  . Stricture and stenosis of esophagus   . Trigger finger (acquired)   . Unspecified cataract   . Unspecified glaucoma(365.9)     Past Surgical History:  Procedure Laterality Date  . APPENDECTOMY  1973   Dr. Linzie Collin  . BREAST LUMPECTOMY Right 08/29/2008   Dr. Erroll Luna  . ESOPHAGEAL DILATION    . NASAL POLYP EXCISION  10/2015  . POLYPECTOMY  11/20/14   Uterine Polyp Removed    Allergies  Allergen Reactions  . Adhesive [Tape] Dermatitis  . Betimol [Timolol Maleate]     Allergic to preservatives   .  Cosopt [Dorzolamide Hcl-Timolol Mal] Other (See Comments)    Turns eye red, increases eye pressure  . Penicillins   . Prednisolone Acetate Other (See Comments)    Caused increased eye pressure  . Tamoxifen Other (See Comments)    Vaginal bleeding  . Thimerosal     Makes pt eye red     Outpatient Encounter Medications as of 08/11/2018  Medication Sig  . ARTIFICIAL TEAR OP Place 1 % into the right eye daily.   . Ca Cit-Vit D-Vit K-Minerals (ADVANCED CALCIUM FORMULA) 200 MG TABS daily.  . Calcium Citrate-Vitamin D 250-200 MG-UNIT TABS Take 250 mg by mouth 2 (two) times daily.  . Cholecalciferol (VITAMIN D-3) 5000 units TABS Take by mouth daily.  . Cyanocobalamin (B-12) 5000 MCG CAPS Take by mouth daily.  . fluorometholone (FML) 0.1 % ophthalmic suspension Place 1 drop into the right eye daily.  Marland Kitchen HYDROcodone-acetaminophen (NORCO) 10-325 MG tablet Take 1 tablet by mouth every 8 (eight) hours as needed for severe pain.  Marland Kitchen LORazepam (ATIVAN) 1 MG tablet Take 0.5 tablets (0.5 mg total) by mouth at bedtime as needed for anxiety or sleep.  Marland Kitchen omeprazole (PRILOSEC) 40 MG capsule Take 40 mg by mouth.  . predniSONE (DELTASONE) 5 MG tablet Take 10 mg by mouth daily with breakfast.  . timolol (TIMOPTIC) 0.5 %  ophthalmic solution PLACE 1 DROP INTO THE RIGHT EYE 2 TIMES DAILY.  . valACYclovir (VALTREX) 1000 MG tablet Take 1,000 mg by mouth daily.    No facility-administered encounter medications on file as of 08/11/2018.     Review of Systems:  Review of Systems  Constitutional: Negative for activity change, appetite change, fever and unexpected weight change.  HENT: Positive for congestion and sore throat. Negative for dental problem, drooling, ear discharge, ear pain, facial swelling, hearing loss, mouth sores, sinus pressure, sinus pain and voice change.   Eyes: Negative.  Negative for discharge, itching and visual disturbance.  Respiratory: Negative.  Negative for cough, shortness of breath and  wheezing.   Cardiovascular: Negative.  Negative for palpitations and leg swelling.  Gastrointestinal: Negative for abdominal distention, abdominal pain, constipation, nausea and vomiting.  Genitourinary: Negative.  Negative for difficulty urinating and dysuria.  Neurological: Positive for headaches. Negative for dizziness, tremors, facial asymmetry, weakness and light-headedness.  Psychiatric/Behavioral: Negative.  Negative for sleep disturbance. The patient is not nervous/anxious.     Health Maintenance  Topic Date Due  . Hepatitis C Screening  1945/10/26  . URINE MICROALBUMIN  [birth year]/03/1955  . COLONOSCOPY  03/26/2019  . TETANUS/TDAP  06/25/2019  . MAMMOGRAM  04/14/2020  . INFLUENZA VACCINE  Completed  . DEXA SCAN  Completed  . PNA vac Low Risk Adult  Completed    Physical Exam: Vitals:   08/11/18 1042  BP: 134/84  Pulse: 75  Temp: 98.2 F (36.8 C)  TempSrc: Oral  SpO2: 96%  Weight: 243 lb 9.6 oz (110.5 kg)  Height: 5\' 4"  (1.626 m)   Body mass index is 41.81 kg/m. Physical Exam Constitutional:      Comments: Morbidly obese  HENT:     Head: Normocephalic.     Right Ear: Ear canal and external ear normal.     Left Ear: Tympanic membrane, ear canal and external ear normal.     Ears:     Comments: Right TM with erythema    Nose: Nose normal.     Mouth/Throat:     Mouth: Mucous membranes are moist.     Pharynx: Oropharynx is clear.  Eyes:     Conjunctiva/sclera: Conjunctivae normal.     Pupils: Pupils are equal, round, and reactive to light.  Neck:     Musculoskeletal: Normal range of motion. Muscular tenderness present.     Comments: Right back of neck is tender Cardiovascular:     Rate and Rhythm: Normal rate and regular rhythm.     Pulses: Normal pulses.     Heart sounds: Normal heart sounds. No murmur.  Pulmonary:     Effort: Pulmonary effort is normal.     Breath sounds: Normal breath sounds.  Abdominal:     General: Bowel sounds are normal.     Palpations:  Abdomen is soft.  Musculoskeletal: Normal range of motion.  Skin:    General: Skin is warm and dry.  Neurological:     General: No focal deficit present.     Mental Status: She is alert and oriented to person, place, and time.     Cranial Nerves: No cranial nerve deficit.     Gait: Gait normal.  Psychiatric:        Mood and Affect: Mood normal.        Behavior: Behavior normal.        Thought Content: Thought content normal.        Judgment: Judgment normal.  Labs reviewed:  Lab Results  Component Value Date   HGBA1C 6.1 (H) 06/05/2018     Assessment/Plan  1. Other acute nonsuppurative otitis media of right ear, recurrence not specified - doxycycline (VIBRA-TABS) 100 MG tablet; Take 1 tablet (100 mg total) by mouth 2 (two) times daily for 7 days.  Dispense: 14 tablet; Refill: 0  -Avoid using Q-tips.  -Follow up if pain persists or new symptoms occur.    Next appt:  10/09/2018   [facility] [phone]

## 2018-08-11 NOTE — Patient Instructions (Addendum)
Take Doxycycline 100 mg twice a day X 7 days .  Avoid using Q-tips.  Follow up if pain persists or new symptoms occur.

## 2018-08-15 ENCOUNTER — Ambulatory Visit: Payer: Medicare Other | Admitting: Podiatry

## 2018-08-21 ENCOUNTER — Encounter: Payer: Self-pay | Admitting: Registered"

## 2018-08-21 ENCOUNTER — Encounter (HOSPITAL_BASED_OUTPATIENT_CLINIC_OR_DEPARTMENT_OTHER): Payer: Medicare Other | Admitting: Registered"

## 2018-08-21 DIAGNOSIS — R7303 Prediabetes: Secondary | ICD-10-CM | POA: Diagnosis not present

## 2018-08-21 NOTE — Progress Notes (Signed)
Patient was seen on 08/21/18 for the Core Session 8 of Diabetes Prevention Program course at [facility]. By the end of this session patients are able to complete the following objectives:   Learning Objectives:  Recognize positive and negative food and activity cues.   Change negative food and activity cues to positive cues.   Add positive cues for activity and eliminate cues for inactivity.   Develop a plan for removing one problem food cue for the coming week.   Goals:   Record weight taken outside of class.   Track foods and beverages eaten each day in the "Food and Activity Tracker," including calories and fat grams for each item.    Track activity type, minutes you were active, and distance you reached each day in the "Food and Activity Tracker."   Set aside one 20 to 30-minute block of time every day or find two or more periods of 10 to15 minutes each for physical activity.   Remove one problem food cue.   Add one positive cue for being more active.  Follow-Up Plan:  Attend Core Session 9 next week.   Bring completed "Food and Activity Tracker" next week to be reviewed by Lifestyle Coach.

## 2018-08-25 ENCOUNTER — Encounter: Payer: Self-pay | Admitting: Registered"

## 2018-08-25 ENCOUNTER — Encounter: Payer: Medicare Other | Attending: Internal Medicine | Admitting: Registered"

## 2018-08-25 DIAGNOSIS — R7303 Prediabetes: Secondary | ICD-10-CM | POA: Diagnosis not present

## 2018-08-25 NOTE — Progress Notes (Signed)
Patient was seen on 08/25/18 for the Core Session 7 of Diabetes Prevention Program course at [facility]. By the end of this session patients are able to complete the following objectives:   Learning Objectives:  Define calorie balance.  Explain how healthy eating and being active are related in terms of calorie balance.   Describe the relationship between calorie balance and weight loss.   Describe his or her progress as it relates to calorie balance.   Develop an activity plan for the coming week.   Goals:   Record weight taken outside of class.   Track foods and beverages eaten each day in the "Food and Activity Tracker," including calories and fat grams for each item.    Track activity type, minutes you were active, and distance you reached each day in the "Food and Activity Tracker."   Set aside one 20 to 30-minute block of time every day or find two or more periods of 10 to15 minutes each for physical activity.   Make a Physical Activities Plan for the Week.   Make active lifestyle choices all through the day   Stay at or go slightly over activity goal.   Follow-Up Plan:  Attend Core Session next week.   Bring completed "Food and Activity Tracker" next week to be reviewed by Lifestyle Coach.

## 2018-08-28 ENCOUNTER — Encounter: Payer: Self-pay | Admitting: Registered"

## 2018-08-28 ENCOUNTER — Encounter: Payer: Medicare Other | Attending: Internal Medicine | Admitting: Registered"

## 2018-08-28 DIAGNOSIS — R7303 Prediabetes: Secondary | ICD-10-CM

## 2018-08-28 NOTE — Progress Notes (Signed)
Patient was seen on 08/28/18 for the Core Session 9 of Diabetes Prevention Program course at [facility]. By the end of this session patients are able to complete the following objectives:   Learning Objectives:  List and describe five steps to problem solving.   Apply the five problem solving steps to resolve a problem he or she has with eating less fat and fewer calories or being more active.   Goals:   Record weight taken outside of class.   Track foods and beverages eaten each day in the "Food and Activity Tracker," including calories and fat grams for each item.    Track activity type, minutes you were active, and distance you reached each day in the "Food and Activity Tracker."   Set aside one 20 to 30-minute block of time every day or find two or more periods of 10 to15 minutes each for physical activity.   Use problem solving action plan created during session to problem solve.   Follow-Up Plan:  Attend Core Session 10 next week.   Bring completed "Food and Activity Tracker" next week to be reviewed by Lifestyle Coach.  Bring menus from favorite restaurants to next session for future discussion.

## 2018-08-29 DIAGNOSIS — M160 Bilateral primary osteoarthritis of hip: Secondary | ICD-10-CM | POA: Diagnosis not present

## 2018-08-29 DIAGNOSIS — M353 Polymyalgia rheumatica: Secondary | ICD-10-CM | POA: Diagnosis not present

## 2018-08-29 DIAGNOSIS — Z853 Personal history of malignant neoplasm of breast: Secondary | ICD-10-CM | POA: Diagnosis not present

## 2018-08-29 DIAGNOSIS — M25519 Pain in unspecified shoulder: Secondary | ICD-10-CM | POA: Diagnosis not present

## 2018-08-29 DIAGNOSIS — Z85820 Personal history of malignant melanoma of skin: Secondary | ICD-10-CM | POA: Diagnosis not present

## 2018-08-29 DIAGNOSIS — M810 Age-related osteoporosis without current pathological fracture: Secondary | ICD-10-CM | POA: Diagnosis not present

## 2018-08-30 ENCOUNTER — Encounter: Payer: Self-pay | Admitting: Podiatry

## 2018-08-30 ENCOUNTER — Ambulatory Visit (INDEPENDENT_AMBULATORY_CARE_PROVIDER_SITE_OTHER): Payer: Medicare Other | Admitting: Podiatry

## 2018-08-30 DIAGNOSIS — M79675 Pain in left toe(s): Secondary | ICD-10-CM | POA: Diagnosis not present

## 2018-08-30 DIAGNOSIS — B351 Tinea unguium: Secondary | ICD-10-CM | POA: Diagnosis not present

## 2018-08-30 DIAGNOSIS — M79674 Pain in right toe(s): Secondary | ICD-10-CM | POA: Diagnosis not present

## 2018-08-30 DIAGNOSIS — E1142 Type 2 diabetes mellitus with diabetic polyneuropathy: Secondary | ICD-10-CM

## 2018-08-30 NOTE — Progress Notes (Signed)
Complaint:  Visit Type: Patient returns to my office for continued preventative foot care services. Complaint: Patient states" my third toenails both feet  have grown long and thick and become painful to walk and wear shoes"  The patient presents for preventative foot care services. No changes to ROS  Podiatric Exam: Vascular: dorsalis pedis and posterior tibial pulses are palpable bilateral. Capillary return is immediate. Temperature gradient is WNL. Skin turgor WNL  Sensorium: Normal Semmes Weinstein monofilament test. Normal tactile sensation bilaterally. Nail Exam: Pt has thick disfigured discolored nails with subungual debris noted third toenails both feet. Ulcer Exam: There is no evidence of ulcer or pre-ulcerative changes or infection. Orthopedic Exam: Muscle tone and strength are WNL. No limitations in general ROM. No crepitus or effusions noted. Foot type and digits show no abnormalities. Bony prominences are unremarkable. Skin: No Porokeratosis. No infection or ulcers  Diagnosis:  Onychomycosis, , Pain in right toe, pain in left toes  Treatment & Plan Procedures and Treatment: Consent by patient was obtained for treatment procedures.   Debridement of mycotic and hypertrophic toenails, 1 through 5 bilateral and clearing of subungual debris. No ulceration, no infection noted.  Return Visit-Office Procedure: Patient instructed to return to the office for a follow up visit 3 months for continued evaluation and treatment.    Devin Ganaway DPM 

## 2018-09-04 ENCOUNTER — Encounter (HOSPITAL_BASED_OUTPATIENT_CLINIC_OR_DEPARTMENT_OTHER): Payer: Medicare Other | Admitting: Registered"

## 2018-09-04 ENCOUNTER — Encounter: Payer: Self-pay | Admitting: Registered"

## 2018-09-04 DIAGNOSIS — R7303 Prediabetes: Secondary | ICD-10-CM

## 2018-09-04 NOTE — Progress Notes (Signed)
Patient was seen on 09/04/18 for the Core Session 10 of Diabetes Prevention Program course at [facility]. By the end of this session patients are able to complete the following objectives:   Learning Objectives:  List and describe the four keys for healthy eating out.   Give examples of how to apply these keys at the type of restaurants that the participants go to regularly.   Make an appropriate meal selection from a restaurant menu.   Demonstrate how to ask for a substitute item using assertive language and a polite tone of voice.    Goals:   Record weight taken outside of class.   Track foods and beverages eaten each day in the "Food and Activity Tracker," including calories and fat grams for each item.    Track activity type, minutes you were active, and distance you reached each day in the "Food and Activity Tracker."   Set aside one 20 to 30-minute block of time every day or find two or more periods of 10 to15 minutes each for physical activity.   Utilize positive action plan and complete questions on "To Do List."   Follow-Up Plan:  Attend Core Session 11 next week.   Bring completed "Food and Activity Tracker" next week to be reviewed by Lifestyle Coach.

## 2018-09-05 ENCOUNTER — Other Ambulatory Visit: Payer: Self-pay | Admitting: *Deleted

## 2018-09-05 DIAGNOSIS — M5431 Sciatica, right side: Secondary | ICD-10-CM

## 2018-09-05 MED ORDER — HYDROCODONE-ACETAMINOPHEN 10-325 MG PO TABS: 1.0000 | ORAL_TABLET | Freq: Three times a day (TID) | ORAL | 0 refills | Status: DC | PRN

## 2018-09-05 NOTE — Telephone Encounter (Signed)
Patient requested refill [facility] Database Verified LR: 07/25/2018 #90 Pended Rx and sent to Dr. Mariea Clonts for approval

## 2018-09-11 ENCOUNTER — Other Ambulatory Visit: Payer: Self-pay | Admitting: Internal Medicine

## 2018-09-11 ENCOUNTER — Encounter: Payer: Medicare Other | Admitting: Registered"

## 2018-09-12 DIAGNOSIS — D225 Melanocytic nevi of trunk: Secondary | ICD-10-CM | POA: Diagnosis not present

## 2018-09-12 DIAGNOSIS — Z1283 Encounter for screening for malignant neoplasm of skin: Secondary | ICD-10-CM | POA: Diagnosis not present

## 2018-09-12 DIAGNOSIS — C4361 Malignant melanoma of right upper limb, including shoulder: Secondary | ICD-10-CM | POA: Diagnosis not present

## 2018-09-18 ENCOUNTER — Encounter (HOSPITAL_BASED_OUTPATIENT_CLINIC_OR_DEPARTMENT_OTHER): Payer: Medicare Other | Admitting: Registered"

## 2018-09-18 ENCOUNTER — Encounter: Payer: Self-pay | Admitting: Registered"

## 2018-09-18 DIAGNOSIS — R7303 Prediabetes: Secondary | ICD-10-CM | POA: Diagnosis not present

## 2018-09-18 NOTE — Progress Notes (Signed)
Patient was seen on 09/18/18 for the Core Session 12 of Diabetes Prevention Program course at [facility]. By the end of this session patients are able to complete the following objectives:   Learning Objectives:  Describe their current progress toward defined goals.  Describe common causes for slipping from healthy eating or being  active.  Explain what to do to get back on their feet after a slip.  Goals:   Record weight taken outside of class.   Track foods and beverages eaten each day in the "Food and Activity Tracker," including calories and fat grams for each item.    Track activity type, minutes active, and distance reached each day in the "Food and Activity Tracker."   Try out the two action plans created during session- "Slips from Healthy Eating: Action Plan" and "Slips from Being Active: Action Plan"  Answer questions on the handout.   Follow-Up Plan:  Attend Core Session 13 next week.   Bring completed "Food and Activity Tracker" next week to be reviewed by Lifestyle Coach.

## 2018-09-25 ENCOUNTER — Encounter: Payer: Medicare Other | Attending: Internal Medicine | Admitting: Registered"

## 2018-09-25 ENCOUNTER — Encounter: Payer: Self-pay | Admitting: Registered"

## 2018-09-25 DIAGNOSIS — R7303 Prediabetes: Secondary | ICD-10-CM | POA: Insufficient documentation

## 2018-09-25 NOTE — Progress Notes (Signed)
Patient was seen on 09/25/18 for the Core Session 13 of Diabetes Prevention Program course at [facility]. By the end of this session patients are able to complete the following objectives:   Learning Objectives:  Describe ways to add interest and variety to their activity plans.  Define ?aerobic fitness.  Explain the four F.I.T.T. principles (frequency, intensity, time, and type of activity) and how they relate to aerobic fitness.   Goals:   Record weight taken outside of class.   Track foods and beverages eaten each day in the "Food and Activity Tracker," including calories and fat grams for each item.    Track activity type, minutes you were active, and distance you reached each day in the "Food and Activity Tracker."   Do your best to reach activity goal for the week.  Use one of the F.I.T.T. principles to jump start workouts.  Document activity level on the "To Do Next Week" handout.  Follow-Up Plan:  Attend Core Session 14 next week.   Bring completed "Food and Activity Tracker" next week to be reviewed by Lifestyle Coach.

## 2018-09-29 DIAGNOSIS — C4361 Malignant melanoma of right upper limb, including shoulder: Secondary | ICD-10-CM | POA: Diagnosis not present

## 2018-09-29 DIAGNOSIS — R1030 Lower abdominal pain, unspecified: Secondary | ICD-10-CM | POA: Diagnosis not present

## 2018-09-29 DIAGNOSIS — C799 Secondary malignant neoplasm of unspecified site: Secondary | ICD-10-CM | POA: Diagnosis not present

## 2018-09-29 DIAGNOSIS — G4452 New daily persistent headache (NDPH): Secondary | ICD-10-CM | POA: Diagnosis not present

## 2018-09-29 DIAGNOSIS — M545 Low back pain: Secondary | ICD-10-CM | POA: Diagnosis not present

## 2018-09-29 DIAGNOSIS — R7309 Other abnormal glucose: Secondary | ICD-10-CM | POA: Diagnosis not present

## 2018-09-29 DIAGNOSIS — M25519 Pain in unspecified shoulder: Secondary | ICD-10-CM | POA: Diagnosis not present

## 2018-09-29 DIAGNOSIS — Z8709 Personal history of other diseases of the respiratory system: Secondary | ICD-10-CM | POA: Diagnosis not present

## 2018-09-29 DIAGNOSIS — Z853 Personal history of malignant neoplasm of breast: Secondary | ICD-10-CM | POA: Diagnosis not present

## 2018-09-29 DIAGNOSIS — G4700 Insomnia, unspecified: Secondary | ICD-10-CM | POA: Diagnosis not present

## 2018-09-29 DIAGNOSIS — Z9221 Personal history of antineoplastic chemotherapy: Secondary | ICD-10-CM | POA: Diagnosis not present

## 2018-09-29 DIAGNOSIS — R51 Headache: Secondary | ICD-10-CM | POA: Diagnosis not present

## 2018-09-29 DIAGNOSIS — Z7952 Long term (current) use of systemic steroids: Secondary | ICD-10-CM | POA: Diagnosis not present

## 2018-10-02 ENCOUNTER — Encounter: Payer: Medicare Other | Admitting: Registered"

## 2018-10-03 DIAGNOSIS — H02836 Dermatochalasis of left eye, unspecified eyelid: Secondary | ICD-10-CM | POA: Diagnosis not present

## 2018-10-03 DIAGNOSIS — Z91041 Radiographic dye allergy status: Secondary | ICD-10-CM | POA: Diagnosis not present

## 2018-10-03 DIAGNOSIS — B0051 Herpesviral iridocyclitis: Secondary | ICD-10-CM | POA: Diagnosis not present

## 2018-10-03 DIAGNOSIS — H527 Unspecified disorder of refraction: Secondary | ICD-10-CM | POA: Diagnosis not present

## 2018-10-03 DIAGNOSIS — C799 Secondary malignant neoplasm of unspecified site: Secondary | ICD-10-CM | POA: Diagnosis not present

## 2018-10-03 DIAGNOSIS — H209 Unspecified iridocyclitis: Secondary | ICD-10-CM | POA: Diagnosis not present

## 2018-10-03 DIAGNOSIS — H16003 Unspecified corneal ulcer, bilateral: Secondary | ICD-10-CM | POA: Diagnosis not present

## 2018-10-03 DIAGNOSIS — H04123 Dry eye syndrome of bilateral lacrimal glands: Secondary | ICD-10-CM | POA: Diagnosis not present

## 2018-10-03 DIAGNOSIS — H02401 Unspecified ptosis of right eyelid: Secondary | ICD-10-CM | POA: Diagnosis not present

## 2018-10-03 DIAGNOSIS — D3131 Benign neoplasm of right choroid: Secondary | ICD-10-CM | POA: Diagnosis not present

## 2018-10-03 DIAGNOSIS — Z888 Allergy status to other drugs, medicaments and biological substances status: Secondary | ICD-10-CM | POA: Diagnosis not present

## 2018-10-03 DIAGNOSIS — H43813 Vitreous degeneration, bilateral: Secondary | ICD-10-CM | POA: Diagnosis not present

## 2018-10-03 DIAGNOSIS — H25812 Combined forms of age-related cataract, left eye: Secondary | ICD-10-CM | POA: Diagnosis not present

## 2018-10-03 DIAGNOSIS — H4041X2 Glaucoma secondary to eye inflammation, right eye, moderate stage: Secondary | ICD-10-CM | POA: Diagnosis not present

## 2018-10-03 DIAGNOSIS — G4452 New daily persistent headache (NDPH): Secondary | ICD-10-CM | POA: Diagnosis not present

## 2018-10-03 DIAGNOSIS — Z961 Presence of intraocular lens: Secondary | ICD-10-CM | POA: Diagnosis not present

## 2018-10-03 DIAGNOSIS — Z880 Allergy status to penicillin: Secondary | ICD-10-CM | POA: Diagnosis not present

## 2018-10-05 DIAGNOSIS — C50411 Malignant neoplasm of upper-outer quadrant of right female breast: Secondary | ICD-10-CM | POA: Diagnosis not present

## 2018-10-05 DIAGNOSIS — Z923 Personal history of irradiation: Secondary | ICD-10-CM | POA: Diagnosis not present

## 2018-10-05 DIAGNOSIS — Z170 Estrogen receptor positive status [ER+]: Secondary | ICD-10-CM | POA: Diagnosis not present

## 2018-10-05 DIAGNOSIS — C4361 Malignant melanoma of right upper limb, including shoulder: Secondary | ICD-10-CM | POA: Diagnosis not present

## 2018-10-05 DIAGNOSIS — C50911 Malignant neoplasm of unspecified site of right female breast: Secondary | ICD-10-CM | POA: Diagnosis not present

## 2018-10-05 DIAGNOSIS — M85832 Other specified disorders of bone density and structure, left forearm: Secondary | ICD-10-CM | POA: Diagnosis not present

## 2018-10-05 DIAGNOSIS — M810 Age-related osteoporosis without current pathological fracture: Secondary | ICD-10-CM | POA: Diagnosis not present

## 2018-10-05 DIAGNOSIS — Z9011 Acquired absence of right breast and nipple: Secondary | ICD-10-CM | POA: Diagnosis not present

## 2018-10-06 ENCOUNTER — Other Ambulatory Visit: Payer: Self-pay | Admitting: *Deleted

## 2018-10-06 DIAGNOSIS — M5431 Sciatica, right side: Secondary | ICD-10-CM

## 2018-10-06 MED ORDER — HYDROCODONE-ACETAMINOPHEN 10-325 MG PO TABS: 1.0000 | ORAL_TABLET | Freq: Three times a day (TID) | ORAL | 0 refills | Status: DC | PRN

## 2018-10-06 NOTE — Telephone Encounter (Signed)
Last filled 09/05/18 Database checked and verified

## 2018-10-09 ENCOUNTER — Ambulatory Visit (INDEPENDENT_AMBULATORY_CARE_PROVIDER_SITE_OTHER): Payer: Medicare Other | Admitting: Internal Medicine

## 2018-10-09 ENCOUNTER — Other Ambulatory Visit: Payer: Self-pay

## 2018-10-09 ENCOUNTER — Encounter: Payer: Self-pay | Admitting: Internal Medicine

## 2018-10-09 VITALS — BP 130/70 | HR 72 | Temp 98.7°F | Ht 64.0 in | Wt 250.0 lb

## 2018-10-09 DIAGNOSIS — R7303 Prediabetes: Secondary | ICD-10-CM | POA: Diagnosis not present

## 2018-10-09 DIAGNOSIS — M7989 Other specified soft tissue disorders: Secondary | ICD-10-CM

## 2018-10-09 DIAGNOSIS — M79661 Pain in right lower leg: Secondary | ICD-10-CM

## 2018-10-09 DIAGNOSIS — M353 Polymyalgia rheumatica: Secondary | ICD-10-CM

## 2018-10-09 DIAGNOSIS — C4361 Malignant melanoma of right upper limb, including shoulder: Secondary | ICD-10-CM | POA: Diagnosis not present

## 2018-10-09 DIAGNOSIS — R51 Headache: Secondary | ICD-10-CM

## 2018-10-09 DIAGNOSIS — Z6841 Body Mass Index (BMI) 40.0 and over, adult: Secondary | ICD-10-CM | POA: Diagnosis not present

## 2018-10-09 DIAGNOSIS — G4486 Cervicogenic headache: Secondary | ICD-10-CM

## 2018-10-09 NOTE — Progress Notes (Signed)
Location:  [facility] clinic Provider:  Aison Malveaux L. Mariea Clonts, D.O., C.M.D.  Goals of Care:  Advanced Directives 02/02/2018  Does Patient Have a Medical Advance Directive? No  Would patient like information on creating a medical advance directive? -     Chief Complaint  Patient presents with  . Medical Management of Chronic Issues    63[facility] follow-up    HPI: Patient is a 73y.o. female seen today for medical management of chronic diseases.    Has only a couple of concerns.    Came in with headache  Behind right ear.  Given abx for infection in right ear.  Did end up seeing a brain MRI to ensure no melanoma metastasized there.  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  Tried icy hot on here neck.  Ears are still clogged up and wonders about allergies due to this muffling.    Went to the breast survival clinic.  For mammogram of right breast which is slightly puffy.  Has a lot of lumps and knots due to prior radiation.    Hip is killing her and now needs hip replacement.  Going to get one kenalog shot first.  Last was 11/19.  Doesn't hurt like it did before to where she couldn't stand on it.  Sometimes feels like sciatic nerve and goes down leg.  It does hurt in her groin on the right.  Shot is 10/25/18.    Right leg and foot are swollen.  It's the foot that she injured 20 years ago.  Used to swell off and on, but now swollen all the time.  Elevating not helping.  Not pitting but close at times.  Left is fine.  Going on now about 6-8 weeks.  ESR had gone up to 56 when she saw rheum.  Prednisone was upped to 12.5 but no improvement. Now on '15mg'$ .  It is better than where she was when she hurt all over.   MRI of brain with and w/o contrast reviewed with her.  Some arthritis of neck, but no atrophy and no metastatic disease.     She had been going to the dietitian.  She's trying to avoid going in a bunch of buildings right now in that building so she's going to do it remotely.  She is taking hydrocodone every  8 hrs, but by 6 hrs she has her legs feeling creepy crawly--she's breaking it in half and taking it every 4 hrs.  Working well till last few days.  Just picked up this morning.    Past Medical History:  Diagnosis Date  . Asymptomatic varicose veins   . Cancer (HCC)    breast   . Diverticulosis   . DM type 2 (diabetes mellitus, type 2) ([place] 10/23/2014  . Dysuria   . GERD (gastroesophageal reflux disease)   . Hiatal hernia   . Hyperlipidemia   . Malignant neoplasm of breast (female), unspecified site   . Migraine without aura, without mention of intractable migraine without mention of status migrainosus   . Other abnormal blood chemistry   . Pain in joint, pelvic region and thigh   . Shingles    in eyes  . Stricture and stenosis of esophagus   . Trigger finger (acquired)   . Unspecified cataract   . Unspecified glaucoma(365.9)     Past Surgical History:  Procedure Laterality Date  . APPENDECTOMY  1973   Dr. CLinzie Collin . BREAST LUMPECTOMY Right 08/29/2008   Dr. TErroll Luna .  ESOPHAGEAL DILATION    . NASAL POLYP EXCISION  10/2015  . POLYPECTOMY  11/20/14   Uterine Polyp Removed    Allergies  Allergen Reactions  . Adhesive [Tape] Dermatitis  . Betimol [Timolol Maleate]     Allergic to preservatives   . Cosopt [Dorzolamide Hcl-Timolol Mal] Other (See Comments)    Turns eye red, increases eye pressure  . Penicillins   . Prednisolone Acetate Other (See Comments)    Caused increased eye pressure  . Tamoxifen Other (See Comments)    Vaginal bleeding  . Thimerosal     Makes pt eye red     Outpatient Encounter Medications as of 10/09/2018  Medication Sig  . ARTIFICIAL TEAR OP Place 1 % into the right eye daily.   . Ca Cit-Vit D-Vit K-Minerals (ADVANCED CALCIUM FORMULA) 200 MG TABS Take by mouth daily.   . Calcium Citrate-Vitamin D 250-200 MG-UNIT TABS Take 250 mg by mouth 2 (two) times daily.  . Cholecalciferol (VITAMIN D-3) 5000 units TABS Take by mouth daily.  .  Cyanocobalamin (B-12) 5000 MCG CAPS Take by mouth daily.  . fluorometholone (FML) 0.1 % ophthalmic suspension Place 1 drop into the right eye daily.  Marland Kitchen HYDROcodone-acetaminophen (NORCO) 10-325 MG tablet Take 1 tablet by mouth every 8 (eight) hours as needed for severe pain.  Marland Kitchen LORazepam (ATIVAN) 1 MG tablet TAKE 0.5 TABLETS (0.5 MG TOTAL) BY MOUTH AT BEDTIME AS NEEDED FOR ANXIETY OR SLEEP.  Marland Kitchen omeprazole (PRILOSEC) 40 MG capsule Take 40 mg by mouth.  . predniSONE (DELTASONE) 10 MG tablet Take 10 mg by mouth daily.  . predniSONE (DELTASONE) 5 MG tablet Take 5 mg by mouth daily with breakfast. Take along with the '10mg'$  tablet  . timolol (TIMOPTIC) 0.5 % ophthalmic solution PLACE 1 DROP INTO THE RIGHT EYE 2 TIMES DAILY.  . valACYclovir (VALTREX) 1000 MG tablet Take 1,000 mg by mouth daily.    No facility-administered encounter medications on file as of 10/09/2018.     Review of Systems:  Review of Systems  Constitutional: Negative for chills, fever, malaise/fatigue and weight loss.  HENT: Positive for congestion. Negative for hearing loss.   Eyes: Negative for blurred vision.  Respiratory: Positive for shortness of breath. Negative for cough, hemoptysis, sputum production and wheezing.   Cardiovascular: Negative for chest pain, palpitations and leg swelling.  Gastrointestinal: Negative for abdominal pain, blood in stool, constipation, diarrhea and melena.  Genitourinary: Negative for dysuria.  Musculoskeletal: Positive for joint pain and myalgias. Negative for falls.       Right hip and back with sciatica; right lower leg painful--prior injury to right leg many years ago, but recently having swelling and pain, tenderness above area that is indented; some minor tenderness of left medial calf also  Skin: Negative for itching and rash.  Neurological: Negative for dizziness, focal weakness, loss of consciousness and weakness.  Endo/Heme/Allergies: Positive for environmental allergies.   Psychiatric/Behavioral: Negative for depression and memory loss. The patient is not nervous/anxious and does not have insomnia.     Health Maintenance  Topic Date Due  . Hepatitis C Screening  03/17/1946  . URINE MICROALBUMIN  07/03/1955  . COLONOSCOPY  03/26/2019  . TETANUS/TDAP  06/25/2019  . MAMMOGRAM  04/14/2020  . INFLUENZA VACCINE  Completed  . DEXA SCAN  Completed  . PNA vac Low Risk Adult  Completed    Physical Exam: Vitals:   10/09/18 1316  BP: 130/70  Pulse: 72  Temp: 98.7 F (37.1 C)  TempSrc:  Oral  SpO2: 98%  Weight: 250 lb (113.4 kg)  Height: '5\' 4"'$  (1.626 m)   Body mass index is 42.91 kg/m. Physical Exam Vitals signs reviewed.  Constitutional:      Appearance: Normal appearance.  HENT:     Head: Normocephalic and atraumatic.  Cardiovascular:     Rate and Rhythm: Normal rate and regular rhythm.     Pulses: Normal pulses.     Heart sounds: Normal heart sounds.  Musculoskeletal: Normal range of motion.     Comments: tenderness above area that is indented/deformity; some minor tenderness of left medial calf also; edema of right foot about 1+ vs left; right has loss of folds in toes; walks with cane   Skin:    General: Skin is warm and dry.     Capillary Refill: Capillary refill takes less than 2 seconds.     Coloration: Skin is pale.  Neurological:     General: No focal deficit present.     Mental Status: She is alert and oriented to person, place, and time.  Psychiatric:        Mood and Affect: Mood normal.     Labs reviewed: Basic Metabolic Panel: No results for input(s): NA, K, CL, CO2, GLUCOSE, BUN, CREATININE, CALCIUM, MG, PHOS, TSH in the last 8760 hours. Liver Function Tests: No results for input(s): AST, ALT, ALKPHOS, BILITOT, PROT, ALBUMIN in the last 8760 hours. No results for input(s): LIPASE, AMYLASE in the last 8760 hours. No results for input(s): AMMONIA in the last 8760 hours. CBC: No results for input(s): WBC, NEUTROABS, HGB,  HCT, MCV, PLT in the last 8760 hours. Lipid Panel: No results for input(s): CHOL, HDL, LDLCALC, TRIG, CHOLHDL, LDLDIRECT in the last 8760 hours. Lab Results  Component Value Date   HGBA1C 6.1 (H) 06/05/2018   [facility]ed [facility] about [facility]  Assessment/Plan 1. Polymyalgia rheumatica (HCC) - cont prednisone per rheum due to recent worsening, pt requested f/u esr today here - Sedimentation rate  2. BMI 40.0-44.9, adult [facility]) -remains an issue, has been seeing dietitian for counseling but not losing weight w/o exercise and with prednisone  3. Malignant melanoma of right upper extremity ([place]) -in remission at this point, followed closely at [place] [facility] just negative for mets and pet scan negative a few months before for any new lesions  4. Cervicogenic headache -MRI brain reviewed and did show anterolisthesis and arthritis of cspine -agree with topical icy hot or similar lidocaine products to help with pain--worked last night for her  5. Prediabetes -continues to try to come off prednisone but recently had a set back of her PMR and dose back up to '15mg'$  daily now - Microalbumin / creatinine urine ratio - Hemoglobin X7D - Basic metabolic panel  6. Class 3 severe obesity due to excess calories without serious comorbidity with body mass index (BMI) of 40.0 to 44.9 in adult [facility]) -ongoing and challenging with her other medical problems and meds -encouraged tele communication for nutrition to keep that up  7. Pain and swelling of right lower leg - unclear etiology, but want to r/o dvt, also wonder about any metastatic disease to cause the pain - VAS [place] LOWER EXTREMITY VENOUS (DVT); Future  Labs/tests ordered:   Orders Placed This Encounter  Procedures  . Microalbumin / creatinine urine ratio  . Hemoglobin A1c  . Basic metabolic panel    Order Specific Question:   Has the patient fasted?    Answer:  Yes  . Sedimentation rate    Next appt:   01/09/2019   Eriona Kinchen L. [place], D.O. [facility] [address], [place] [address] Cell Phone (Mon-Fri 8am-5pm):  [phone] On Call:  [phone] & follow prompts after 5pm & weekends Office Phone:  [phone] Office Fax:  [phone]

## 2018-10-10 LAB — BASIC METABOLIC PANEL
BUN: 17 mg/dL (ref 7–25)
CO2: 29 mmol/L (ref 20–32)
Calcium: 10.3 mg/dL (ref 8.6–10.4)
Chloride: 99 mmol/L (ref 98–110)
Creat: 0.62 mg/dL (ref 0.60–0.93)
Glucose, Bld: 119 mg/dL (ref 65–139)
Potassium: 4.5 mmol/L (ref 3.5–5.3)
Sodium: 138 mmol/L (ref 135–146)

## 2018-10-10 LAB — MICROALBUMIN / CREATININE URINE RATIO
Creatinine, Urine: 38 mg/dL (ref 20–275)
Microalb Creat Ratio: 8 mcg/mg creat (ref <30)
Microalb, Ur: 0.3 mg/dL

## 2018-10-10 LAB — SEDIMENTATION RATE: Sed Rate: 33 mm/h — ABNORMAL HIGH (ref 0–30)

## 2018-10-10 LAB — HEMOGLOBIN A1C
Hgb A1c MFr Bld: 6 % of total Hgb — ABNORMAL HIGH (ref <5.7)
Mean Plasma Glucose: 126 (calc)
eAG (mmol/L): 7 (calc)

## 2018-10-11 ENCOUNTER — Telehealth: Payer: Self-pay

## 2018-10-11 ENCOUNTER — Ambulatory Visit (HOSPITAL_COMMUNITY)
Admission: RE | Admit: 2018-10-11 | Discharge: 2018-10-11 | Disposition: A | Discharge status: Home / Self Care | Payer: Medicare Other | Source: Ambulatory Visit | Attending: Family | Admitting: Family

## 2018-10-11 ENCOUNTER — Other Ambulatory Visit: Payer: Self-pay

## 2018-10-11 DIAGNOSIS — M79661 Pain in right lower leg: Secondary | ICD-10-CM | POA: Diagnosis not present

## 2018-10-11 DIAGNOSIS — M7989 Other specified soft tissue disorders: Secondary | ICD-10-CM | POA: Insufficient documentation

## 2018-10-11 NOTE — Progress Notes (Signed)
RLE Venous Duplex performed. [facility] to [facility] in Dr. Cyndi Lennert office. Patient instructed to go home.

## 2018-10-11 NOTE — Telephone Encounter (Signed)
Pearl from [facility] called with preliminary report for patient .  Stated that imagine to rule out DVT was negative for thrombosis in deep superficial veins.

## 2018-10-11 NOTE — Telephone Encounter (Signed)
Noted thanks.  I saw the final report just now.

## 2018-10-12 DIAGNOSIS — Z170 Estrogen receptor positive status [ER+]: Secondary | ICD-10-CM | POA: Diagnosis not present

## 2018-10-12 DIAGNOSIS — C50411 Malignant neoplasm of upper-outer quadrant of right female breast: Secondary | ICD-10-CM | POA: Diagnosis not present

## 2018-10-12 DIAGNOSIS — N6311 Unspecified lump in the right breast, upper outer quadrant: Secondary | ICD-10-CM | POA: Diagnosis not present

## 2018-10-12 DIAGNOSIS — C50911 Malignant neoplasm of unspecified site of right female breast: Secondary | ICD-10-CM | POA: Diagnosis not present

## 2018-10-12 DIAGNOSIS — L905 Scar conditions and fibrosis of skin: Secondary | ICD-10-CM | POA: Diagnosis not present

## 2018-10-12 DIAGNOSIS — R928 Other abnormal and inconclusive findings on diagnostic imaging of breast: Secondary | ICD-10-CM | POA: Diagnosis not present

## 2018-10-14 ENCOUNTER — Encounter: Payer: Self-pay | Admitting: Internal Medicine

## 2018-11-02 ENCOUNTER — Other Ambulatory Visit: Payer: Self-pay | Admitting: *Deleted

## 2018-11-02 DIAGNOSIS — M5431 Sciatica, right side: Secondary | ICD-10-CM

## 2018-11-02 MED ORDER — HYDROCODONE-ACETAMINOPHEN 10-325 MG PO TABS: 1.0000 | ORAL_TABLET | Freq: Three times a day (TID) | ORAL | 0 refills | Status: DC | PRN

## 2018-11-02 NOTE — Telephone Encounter (Signed)
Patient requested refill [place] Database Verified LR: 10/06/2018 Pended Rx and sent to Dr. Mariea Clonts for approval.

## 2018-11-07 DIAGNOSIS — Z7952 Long term (current) use of systemic steroids: Secondary | ICD-10-CM | POA: Diagnosis not present

## 2018-11-07 DIAGNOSIS — M353 Polymyalgia rheumatica: Secondary | ICD-10-CM | POA: Diagnosis not present

## 2018-11-07 DIAGNOSIS — I10 Essential (primary) hypertension: Secondary | ICD-10-CM | POA: Diagnosis not present

## 2018-11-07 DIAGNOSIS — M5136 Other intervertebral disc degeneration, lumbar region: Secondary | ICD-10-CM | POA: Diagnosis not present

## 2018-11-07 DIAGNOSIS — M47896 Other spondylosis, lumbar region: Secondary | ICD-10-CM | POA: Diagnosis not present

## 2018-11-07 DIAGNOSIS — M1611 Unilateral primary osteoarthritis, right hip: Secondary | ICD-10-CM | POA: Diagnosis not present

## 2018-11-07 DIAGNOSIS — E119 Type 2 diabetes mellitus without complications: Secondary | ICD-10-CM | POA: Diagnosis not present

## 2018-11-07 DIAGNOSIS — M48061 Spinal stenosis, lumbar region without neurogenic claudication: Secondary | ICD-10-CM | POA: Diagnosis not present

## 2018-11-14 DIAGNOSIS — N3000 Acute cystitis without hematuria: Secondary | ICD-10-CM | POA: Diagnosis not present

## 2018-11-29 ENCOUNTER — Other Ambulatory Visit: Payer: Self-pay

## 2018-11-29 ENCOUNTER — Ambulatory Visit (INDEPENDENT_AMBULATORY_CARE_PROVIDER_SITE_OTHER): Payer: Medicare Other | Admitting: Podiatry

## 2018-11-29 ENCOUNTER — Encounter: Payer: Self-pay | Admitting: Podiatry

## 2018-11-29 VITALS — Temp 97.5°F

## 2018-11-29 DIAGNOSIS — B351 Tinea unguium: Secondary | ICD-10-CM

## 2018-11-29 DIAGNOSIS — M79674 Pain in right toe(s): Secondary | ICD-10-CM | POA: Diagnosis not present

## 2018-11-29 DIAGNOSIS — M79675 Pain in left toe(s): Secondary | ICD-10-CM

## 2018-11-29 DIAGNOSIS — E1142 Type 2 diabetes mellitus with diabetic polyneuropathy: Secondary | ICD-10-CM

## 2018-11-29 NOTE — Progress Notes (Signed)
Complaint:  Visit Type: Patient returns to my office for continued preventative foot care services. Complaint: Patient states" my third toenails both feet  have grown long and thick and become painful to walk and wear shoes"  The patient presents for preventative foot care services. No changes to ROS  Podiatric Exam: Vascular: dorsalis pedis and posterior tibial pulses are palpable bilateral. Capillary return is immediate. Temperature gradient is WNL. Skin turgor WNL  Sensorium: Normal Semmes Weinstein monofilament test. Normal tactile sensation bilaterally. Nail Exam: Pt has thick disfigured discolored nails with subungual debris noted third toenails both feet. Ulcer Exam: There is no evidence of ulcer or pre-ulcerative changes or infection. Orthopedic Exam: Muscle tone and strength are WNL. No limitations in general ROM. No crepitus or effusions noted. Foot type and digits show no abnormalities. Bony prominences are unremarkable. Skin: No Porokeratosis. No infection or ulcers  Diagnosis:  Onychomycosis, , Pain in right toe, pain in left toes  Treatment & Plan Procedures and Treatment: Consent by patient was obtained for treatment procedures.   Debridement of mycotic and hypertrophic toenails, 1 through 5 bilateral and clearing of subungual debris. No ulceration, no infection noted.  Return Visit-Office Procedure: Patient instructed to return to the office for a follow up visit 3 months for continued evaluation and treatment.    Gardiner Barefoot DPM

## 2018-11-30 ENCOUNTER — Other Ambulatory Visit: Payer: Self-pay | Admitting: Internal Medicine

## 2018-11-30 NOTE — Telephone Encounter (Signed)
[place] Database verified and compliance confirmed   Last filled 09/11/2018

## 2018-12-05 ENCOUNTER — Other Ambulatory Visit: Payer: Self-pay | Admitting: *Deleted

## 2018-12-05 DIAGNOSIS — M5431 Sciatica, right side: Secondary | ICD-10-CM

## 2018-12-05 MED ORDER — HYDROCODONE-ACETAMINOPHEN 10-325 MG PO TABS: 1.0000 | ORAL_TABLET | Freq: Three times a day (TID) | ORAL | 0 refills | Status: DC | PRN

## 2018-12-05 NOTE — Telephone Encounter (Signed)
Patient requested refill [place] Database Verified LR: 11/07/2018 Pended Rx and sent to Dr. Mariea Clonts for approval.

## 2018-12-19 DIAGNOSIS — C4361 Malignant melanoma of right upper limb, including shoulder: Secondary | ICD-10-CM | POA: Diagnosis not present

## 2018-12-29 DIAGNOSIS — C4361 Malignant melanoma of right upper limb, including shoulder: Secondary | ICD-10-CM | POA: Diagnosis not present

## 2018-12-29 DIAGNOSIS — C799 Secondary malignant neoplasm of unspecified site: Secondary | ICD-10-CM | POA: Diagnosis not present

## 2018-12-29 DIAGNOSIS — C50911 Malignant neoplasm of unspecified site of right female breast: Secondary | ICD-10-CM | POA: Diagnosis not present

## 2018-12-29 DIAGNOSIS — M25519 Pain in unspecified shoulder: Secondary | ICD-10-CM | POA: Diagnosis not present

## 2018-12-29 DIAGNOSIS — Z9289 Personal history of other medical treatment: Secondary | ICD-10-CM | POA: Diagnosis not present

## 2018-12-29 DIAGNOSIS — Z85820 Personal history of malignant melanoma of skin: Secondary | ICD-10-CM | POA: Diagnosis not present

## 2018-12-29 DIAGNOSIS — C50411 Malignant neoplasm of upper-outer quadrant of right female breast: Secondary | ICD-10-CM | POA: Diagnosis not present

## 2018-12-29 DIAGNOSIS — Z08 Encounter for follow-up examination after completed treatment for malignant neoplasm: Secondary | ICD-10-CM | POA: Diagnosis not present

## 2018-12-29 DIAGNOSIS — G4700 Insomnia, unspecified: Secondary | ICD-10-CM | POA: Diagnosis not present

## 2018-12-29 DIAGNOSIS — J339 Nasal polyp, unspecified: Secondary | ICD-10-CM | POA: Diagnosis not present

## 2018-12-29 DIAGNOSIS — R7302 Impaired glucose tolerance (oral): Secondary | ICD-10-CM | POA: Diagnosis not present

## 2018-12-29 DIAGNOSIS — Z853 Personal history of malignant neoplasm of breast: Secondary | ICD-10-CM | POA: Diagnosis not present

## 2018-12-29 DIAGNOSIS — R1032 Left lower quadrant pain: Secondary | ICD-10-CM | POA: Diagnosis not present

## 2018-12-29 DIAGNOSIS — Z170 Estrogen receptor positive status [ER+]: Secondary | ICD-10-CM | POA: Diagnosis not present

## 2018-12-29 DIAGNOSIS — C50511 Malignant neoplasm of lower-outer quadrant of right female breast: Secondary | ICD-10-CM | POA: Diagnosis not present

## 2018-12-29 DIAGNOSIS — M545 Low back pain: Secondary | ICD-10-CM | POA: Diagnosis not present

## 2018-12-29 DIAGNOSIS — Z9221 Personal history of antineoplastic chemotherapy: Secondary | ICD-10-CM | POA: Diagnosis not present

## 2019-01-02 DIAGNOSIS — Z9889 Other specified postprocedural states: Secondary | ICD-10-CM | POA: Diagnosis not present

## 2019-01-02 DIAGNOSIS — R102 Pelvic and perineal pain: Secondary | ICD-10-CM | POA: Diagnosis not present

## 2019-01-02 DIAGNOSIS — M25551 Pain in right hip: Secondary | ICD-10-CM | POA: Diagnosis not present

## 2019-01-02 DIAGNOSIS — Z853 Personal history of malignant neoplasm of breast: Secondary | ICD-10-CM | POA: Diagnosis not present

## 2019-01-02 DIAGNOSIS — Z9221 Personal history of antineoplastic chemotherapy: Secondary | ICD-10-CM | POA: Diagnosis not present

## 2019-01-03 ENCOUNTER — Other Ambulatory Visit: Payer: Self-pay | Admitting: *Deleted

## 2019-01-03 DIAGNOSIS — M5431 Sciatica, right side: Secondary | ICD-10-CM

## 2019-01-03 MED ORDER — HYDROCODONE-ACETAMINOPHEN 10-325 MG PO TABS: 1.0000 | ORAL_TABLET | Freq: Three times a day (TID) | ORAL | 0 refills | Status: DC | PRN

## 2019-01-03 NOTE — Telephone Encounter (Signed)
Patient requested refill [place] Database Verified LR: 12/05/2018 Pended Rx and sent to Dr. Mariea Clonts for approval.

## 2019-01-08 ENCOUNTER — Ambulatory Visit: Payer: Self-pay

## 2019-01-08 ENCOUNTER — Encounter: Payer: Medicare Other | Admitting: Family

## 2019-01-09 ENCOUNTER — Encounter: Payer: Medicare Other | Admitting: Family

## 2019-01-10 DIAGNOSIS — M7989 Other specified soft tissue disorders: Secondary | ICD-10-CM | POA: Diagnosis not present

## 2019-01-10 DIAGNOSIS — R600 Localized edema: Secondary | ICD-10-CM | POA: Diagnosis not present

## 2019-01-10 DIAGNOSIS — M7918 Myalgia, other site: Secondary | ICD-10-CM | POA: Diagnosis not present

## 2019-01-10 DIAGNOSIS — M810 Age-related osteoporosis without current pathological fracture: Secondary | ICD-10-CM | POA: Diagnosis not present

## 2019-01-10 DIAGNOSIS — Z7952 Long term (current) use of systemic steroids: Secondary | ICD-10-CM | POA: Diagnosis not present

## 2019-01-10 DIAGNOSIS — M353 Polymyalgia rheumatica: Secondary | ICD-10-CM | POA: Diagnosis not present

## 2019-01-16 ENCOUNTER — Ambulatory Visit (INDEPENDENT_AMBULATORY_CARE_PROVIDER_SITE_OTHER): Payer: Medicare Other | Admitting: Family

## 2019-01-16 ENCOUNTER — Encounter: Payer: Self-pay | Admitting: Family

## 2019-01-16 ENCOUNTER — Other Ambulatory Visit: Payer: Self-pay

## 2019-01-16 DIAGNOSIS — Z0000 Encounter for general adult medical examination without abnormal findings: Secondary | ICD-10-CM

## 2019-01-16 NOTE — Patient Instructions (Signed)
[name] , Thank you for taking time to come for your Medicare Wellness Visit. I appreciate your ongoing commitment to your health goals. Please review the following plan we discussed and let me know if I can assist you in the future.   Screening recommendations/referrals: Colonoscopy: Due 03/26/2019 discuss Cologuard with PCP  Mammogram Up to date  Bone Density: Upcoming appointment 02/2019 Recommended yearly ophthalmology/optometry visit for glaucoma screening and checkup Recommended yearly dental visit for hygiene and checkup  Vaccinations: Influenza vaccine: Up to date  Pneumococcal vaccine: Up to date  Tdap vaccine: Up to date due 06/25/2019  Shingles vaccine: Up to date declined new vaccine.    Advanced directives: No information send via mail.   Conditions/risks identified: Advance age > 56 woman,Obesity,sedentary lifestyle,Hyperlipidemia,smoking exposure   Next appointment: 1 year    Preventive Care 74 Years and Older, Female Preventive care refers to lifestyle choices and visits with your health care provider that can promote health and wellness. What does preventive care include?  A yearly physical exam. This is also called an annual well check.  Dental exams once or twice a year.  Routine eye exams. Ask your health care provider how often you should have your eyes checked.  Personal lifestyle choices, including:  Daily care of your teeth and gums.  Regular physical activity.  Eating a healthy diet.  Avoiding tobacco and drug use.  Limiting alcohol use.  Practicing safe sex.  Taking low-dose aspirin every day.  Taking vitamin and mineral supplements as recommended by your health care provider. What happens during an annual well check? The services and screenings done by your health care provider during your annual well check will depend on your age, overall health, lifestyle risk factors, and family history of disease. Counseling  Your health care  provider may ask you questions about your:  Alcohol use.  Tobacco use.  Drug use.  Emotional well-being.  Home and relationship well-being.  Sexual activity.  Eating habits.  History of falls.  Memory and ability to understand (cognition).  Work and work Statistician.  Reproductive health. Screening  You may have the following tests or measurements:  Height, weight, and BMI.  Blood pressure.  Lipid and cholesterol levels. These may be checked every 5 years, or more frequently if you are over 37 years old.  Skin check.  Lung cancer screening. You may have this screening every year starting at age 60 if you have a 30-pack-year history of smoking and currently smoke or have quit within the past 15 years.  Fecal occult blood test (FOBT) of the stool. You may have this test every year starting at age 31.  Flexible sigmoidoscopy or colonoscopy. You may have a sigmoidoscopy every 5 years or a colonoscopy every 10 years starting at age 25.  Hepatitis C blood test.  Hepatitis B blood test.  Sexually transmitted disease (STD) testing.  Diabetes screening. This is done by checking your blood sugar (glucose) after you have not eaten for a while (fasting). You may have this done every 1-3 years.  Bone density scan. This is done to screen for osteoporosis. You may have this done starting at age 11.  Mammogram. This may be done every 1-2 years. Talk to your health care provider about how often you should have regular mammograms. Talk with your health care provider about your test results, treatment options, and if necessary, the need for more tests. Vaccines  Your health care provider may recommend certain vaccines, such as:  Influenza  vaccine. This is recommended every year.  Tetanus, diphtheria, and acellular pertussis (Tdap, Td) vaccine. You may need a Td booster every 10 years.  Zoster vaccine. You may need this after age 39.  Pneumococcal 13-valent conjugate (PCV13)  vaccine. One dose is recommended after age 68.  Pneumococcal polysaccharide (PPSV23) vaccine. One dose is recommended after age 37. Talk to your health care provider about which screenings and vaccines you need and how often you need them. This information is not intended to replace advice given to you by your health care provider. Make sure you discuss any questions you have with your health care provider. Document Released: 08/08/2015 Document Revised: 03/31/2016 Document Reviewed: 05/13/2015 Elsevier Interactive Patient Education  2017 Bath Prevention in the Home Falls can cause injuries. They can happen to people of all ages. There are many things you can do to make your home safe and to help prevent falls. What can I do on the outside of my home?  Regularly fix the edges of walkways and driveways and fix any cracks.  Remove anything that might make you trip as you walk through a door, such as a raised step or threshold.  Trim any bushes or trees on the path to your home.  Use bright outdoor lighting.  Clear any walking paths of anything that might make someone trip, such as rocks or tools.  Regularly check to see if handrails are loose or broken. Make sure that both sides of any steps have handrails.  Any raised decks and porches should have guardrails on the edges.  Have any leaves, snow, or ice cleared regularly.  Use sand or salt on walking paths during winter.  Clean up any spills in your garage right away. This includes oil or grease spills. What can I do in the bathroom?  Use night lights.  Install grab bars by the toilet and in the tub and shower. Do not use towel bars as grab bars.  Use non-skid mats or decals in the tub or shower.  If you need to sit down in the shower, use a plastic, non-slip stool.  Keep the floor dry. Clean up any water that spills on the floor as soon as it happens.  Remove soap buildup in the tub or shower regularly.   Attach bath mats securely with double-sided non-slip rug tape.  Do not have throw rugs and other things on the floor that can make you trip. What can I do in the bedroom?  Use night lights.  Make sure that you have a light by your bed that is easy to reach.  Do not use any sheets or blankets that are too big for your bed. They should not hang down onto the floor.  Have a firm chair that has side arms. You can use this for support while you get dressed.  Do not have throw rugs and other things on the floor that can make you trip. What can I do in the kitchen?  Clean up any spills right away.  Avoid walking on wet floors.  Keep items that you use a lot in easy-to-reach places.  If you need to reach something above you, use a strong step stool that has a grab bar.  Keep electrical cords out of the way.  Do not use floor polish or wax that makes floors slippery. If you must use wax, use non-skid floor wax.  Do not have throw rugs and other things on the floor that can  make you trip. What can I do with my stairs?  Do not leave any items on the stairs.  Make sure that there are handrails on both sides of the stairs and use them. Fix handrails that are broken or loose. Make sure that handrails are as long as the stairways.  Check any carpeting to make sure that it is firmly attached to the stairs. Fix any carpet that is loose or worn.  Avoid having throw rugs at the top or bottom of the stairs. If you do have throw rugs, attach them to the floor with carpet tape.  Make sure that you have a light switch at the top of the stairs and the bottom of the stairs. If you do not have them, ask someone to add them for you. What else can I do to help prevent falls?  Wear shoes that:  Do not have high heels.  Have rubber bottoms.  Are comfortable and fit you well.  Are closed at the toe. Do not wear sandals.  If you use a stepladder:  Make sure that it is fully opened. Do not climb  a closed stepladder.  Make sure that both sides of the stepladder are locked into place.  Ask someone to hold it for you, if possible.  Clearly mark and make sure that you can see:  Any grab bars or handrails.  First and last steps.  Where the edge of each step is.  Use tools that help you move around (mobility aids) if they are needed. These include:  Canes.  Walkers.  Scooters.  Crutches.  Turn on the lights when you go into a dark area. Replace any light bulbs as soon as they burn out.  Set up your furniture so you have a clear path. Avoid moving your furniture around.  If any of your floors are uneven, fix them.  If there are any pets around you, be aware of where they are.  Review your medicines with your doctor. Some medicines can make you feel dizzy. This can increase your chance of falling. Ask your doctor what other things that you can do to help prevent falls. This information is not intended to replace advice given to you by your health care provider. Make sure you discuss any questions you have with your health care provider. Document Released: 05/08/2009 Document Revised: 12/18/2015 Document Reviewed: 08/16/2014 Elsevier Interactive Patient Education  2017 [employer].

## 2019-01-16 NOTE — Progress Notes (Signed)
Patient ID: [name], female   DOB: 05/31/1946, 73 y.o.   MRN: [id number] This service is provided via telemedicine  No vital signs collected/recorded due to the encounter was a telemedicine visit.   Location of patient (ex: home, work):  HOME  Patient consents to a telephone visit:  YES  Location of the provider (ex: office, home):  OFFICE  Name of any referring provider:  TIFFANY REED, DO  Names of all persons participating in the telemedicine service and their role in the encounter:  PATIENT, [name], [place], [place], NP  Time spent on call:  11:37

## 2019-01-16 NOTE — Progress Notes (Signed)
Subjective:   [name] is a 73 y.o. female who presents for Medicare Annual (Subsequent) preventive examination.  Review of Systems:   Cardiac Risk Factors include: advanced age (>28men, >27 women);obesity (BMI >30kg/m2);sedentary lifestyle;dyslipidemia;smoking/ tobacco exposure;Other (see comment)(Father and spouse smoked indoors)     Objective:     Vitals: There were no vitals taken for this visit.  There is no height or weight on file to calculate BMI.  Advanced Directives 01/16/2019 02/02/2018 01/03/2018 10/18/2017 09/26/2017 05/26/2017 01/18/2017  Does Patient Have a Medical Advance Directive? No No No No No No No  Would patient like information on creating a medical advance directive? No - Patient declined - Yes (MAU/Ambulatory/Procedural Areas - Information given) Yes (MAU/Ambulatory/Procedural Areas - Information given) No - Patient declined No - Patient declined -    Tobacco Social History   Tobacco Use  Smoking Status Never Smoker  Smokeless Tobacco Never Used     Counseling given: Not Answered   Clinical Intake:  Pre-visit preparation completed: No  Pain : 0-10 Pain Score: 5  Pain Type: Chronic pain Pain Location: Hip Pain Orientation: Right Pain Radiating Towards: back,leg Pain Descriptors / Indicators: Aching Pain Onset: Other (comment)(about 1 year) Pain Frequency: Constant Pain Relieving Factors: hydrocodone-APAP 10-325 mg tablet one every 8 Hrs as needed Effect of Pain on Daily Activities: yes walking  Pain Relieving Factors: hydrocodone-APAP 10-325 mg tablet one every 8 Hrs as needed  BMI - recorded: 41.81(recent BMI on chart) Nutritional Status: BMI > 30  Obese Nutritional Risks: None Diabetes: No  How often do you need to have someone help you when you read instructions, pamphlets, or other written materials from your doctor or pharmacy?: 1 - Never What is the last grade level you completed in school?: College  Interpreter Needed?: No      Past Medical History:  Diagnosis Date  . Asymptomatic varicose veins   . Cancer (HCC)    breast   . Diverticulosis   . DM type 2 (diabetes mellitus, type 2) ([place]) 10/23/2014  . Dysuria   . GERD (gastroesophageal reflux disease)   . Hiatal hernia   . Hyperlipidemia   . Malignant neoplasm of breast (female), unspecified site   . Migraine without aura, without mention of intractable migraine without mention of status migrainosus   . Other abnormal blood chemistry   . Pain in joint, pelvic region and thigh   . Shingles    in eyes  . Stricture and stenosis of esophagus   . Trigger finger (acquired)   . Unspecified cataract   . Unspecified glaucoma(365.9)    Past Surgical History:  Procedure Laterality Date  . APPENDECTOMY  1973   Dr. Linzie Collin  . BREAST LUMPECTOMY Right 08/29/2008   Dr. Erroll Luna  . ESOPHAGEAL DILATION    . NASAL POLYP EXCISION  10/2015  . POLYPECTOMY  11/20/14   Uterine Polyp Removed   Family History  Problem Relation Age of Onset  . Hypertension Mother   . Hyperlipidemia Mother   . Alzheimer's disease Mother   . Emphysema Father   . COPD Father    Social History   Socioeconomic History  . Marital status: Divorced    Spouse name: Not on file  . Number of children: Not on file  . Years of education: Not on file  . Highest education level: Not on file  Occupational History  . Not on file  Social Needs  . Financial resource strain: Not hard at all  .  Food insecurity    Worry: Never true    Inability: Never true  . Transportation needs    Medical: No    Non-medical: No  Tobacco Use  . Smoking status: Never Smoker  . Smokeless tobacco: Never Used  Substance and Sexual Activity  . Alcohol use: No  . Drug use: No  . Sexual activity: Not Currently  Lifestyle  . Physical activity    Days per week: 3 days    Minutes per session: 10 min  . Stress: To some extent  Relationships  . Social Herbalist on phone: Twice a week    Gets  together: Twice a week    Attends religious service: Never    Active member of club or organization: No    Attends meetings of clubs or organizations: Never    Relationship status: Divorced  Other Topics Concern  . Not on file  Social History Narrative  . Not on file    Outpatient Encounter Medications as of 01/16/2019  Medication Sig  . Ascorbic Acid (VITAMIN C) 100 MG tablet Take 100 mg by mouth daily.  . Ca Cit-Vit D-Vit K-Minerals (ADVANCED CALCIUM FORMULA) 200 MG TABS Take by mouth daily.   . Calcium Citrate-Vitamin D 250-200 MG-UNIT TABS Take 250 mg by mouth 2 (two) times daily.  . Cholecalciferol (VITAMIN D-3) 5000 units TABS Take by mouth daily.  . Cyanocobalamin (B-12) 5000 MCG CAPS Take by mouth daily.  . fluorometholone (FML) 0.1 % ophthalmic suspension Place 1 drop into the right eye daily.  Marland Kitchen HYDROcodone-acetaminophen (NORCO) 10-325 MG tablet Take 1 tablet by mouth every 8 (eight) hours as needed for severe pain.  Marland Kitchen LORazepam (ATIVAN) 1 MG tablet TAKE 1/2 TABLET BY MOUTH AT BEDTIME FOR SLEEP  . Magnesium 250 MG TABS Take 2 tablets by mouth daily.  . Multiple Vitamin (MULTIVITAMIN) capsule Take 1 capsule by mouth daily.  Marland Kitchen omeprazole (PRILOSEC) 40 MG capsule Take 40 mg by mouth.  . predniSONE (DELTASONE) 10 MG tablet Take 10 mg by mouth daily. With the 5mg   . predniSONE (DELTASONE) 5 MG tablet Take 5 mg by mouth daily with breakfast. Take along with the 10mg  tablet  . timolol (TIMOPTIC) 0.5 % ophthalmic solution PLACE 1 DROP INTO THE RIGHT EYE 2 TIMES DAILY.  . valACYclovir (VALTREX) 1000 MG tablet Take 1,000 mg by mouth daily.   . [DISCONTINUED] ARTIFICIAL TEAR OP Place 1 % into the right eye daily.   . [DISCONTINUED] doxycycline (VIBRA-TABS) 100 MG tablet Take 100 mg by mouth 2 (two) times daily.  . [DISCONTINUED] nitrofurantoin, macrocrystal-monohydrate, (MACROBID) 100 MG capsule Take 100 mg by mouth 2 (two) times daily.  . [DISCONTINUED] triamcinolone acetonide  (TRIESENCE) 40 MG/ML SUSP Inject into the articular space.   No facility-administered encounter medications on file as of 01/16/2019.     Activities of Daily Living In your present state of health, do you have any difficulty performing the following activities: 01/16/2019  Hearing? N  Vision? N  Difficulty concentrating or making decisions? N  Walking or climbing stairs? Y  Comment limited due to hip pain  Dressing or bathing? N  Doing errands, shopping? Y  Comment son [place] and eating ? N  Using the Toilet? N  In the past six months, have you accidently leaked urine? N  Do you have problems with loss of bowel control? N  Managing your Medications? N  Managing your Finances? N  Housekeeping or managing your Housekeeping?  Y  Comment son assist  Some recent data might be hidden    Patient Care Team: Gayland Curry, DO as PCP - General (Geriatric Medicine) Magrinat, Virgie Dad, MD as Consulting Physician (Oncology) Erroll Luna, MD as Consulting Physician (General Surgery) Harvie Heck, MD as Physician Assistant (Internal Medicine) Chyrel Masson, DO as Anesthesiologist (Otolaryngology) Clinger, Chrystie Nose, MD (Otolaryngology) Turner Daniels, MD as Referring Physician (Internal Medicine)    Assessment:   This is a routine wellness examination for Donnamarie.  Exercise Activities and Dietary recommendations Current Exercise Habits: The patient does not participate in regular exercise at present, Exercise limited by: Other - see comments(limited by back and right hip pain)  Goals    . Exercise 3x per week (30 min per time)     Starting 10/29/2016 I would like to start walking 3 days a week for 1-2 miles.       Fall Risk Fall Risk  01/16/2019 10/09/2018 06/05/2018 04/07/2018 02/02/2018  Falls in the past year? 0 0 0 No No  Number falls in past yr: 0 0 0 - -  Injury with Fall? 0 0 0 - -   Is the patient's home free of loose throw rugs in walkways, pet beds,  electrical cords, etc?   yes      Grab bars in the bathroom? no      Handrails on the stairs?   no      Adequate lighting?   yes  Depression Screen PHQ 2/9 Scores 01/16/2019 10/09/2018 06/05/2018 04/07/2018  PHQ - 2 Score 0 0 0 0     Cognitive Function MMSE - Mini Mental State Exam 01/03/2018 10/29/2016 04/24/2014  Orientation to time 5 5 5   Orientation to Place 5 5 5   Registration 3 3 3   Attention/ Calculation 5 5 5   Recall 1 3 2   Language- name 2 objects 2 2 2   Language- repeat 1 1 1   Language- follow 3 step command 3 3 3   Language- read & follow direction 1 1 1   Write a sentence 1 1 1   Copy design 1 1 1   Total score 28 30 29      6CIT Screen 01/16/2019  What Year? 0 points  What month? 0 points  What time? 0 points  Count back from 20 0 points  Months in reverse 0 points  Repeat phrase 0 points  Total Score 0    Immunization History  Administered Date(s) Administered  . Influenza Whole 07/26/2010  . Influenza,inj,Quad PF,6+ Mos 04/24/2014, 04/01/2015, 05/05/2016  . Influenza-Unspecified 06/30/2010, 04/25/2013, 05/31/2018  . Pneumococcal Conjugate-13 10/23/2014  . Pneumococcal Polysaccharide-23 10/29/2016  . Td 06/24/2009  . Tdap 07/26/2008  . Zoster 10/17/2013    Qualifies for Shingles Vaccine? Declined   Screening Tests Health Maintenance  Topic Date Due  . Hepatitis C Screening  [birth year]  . INFLUENZA VACCINE  02/24/2019  . COLONOSCOPY  03/26/2019  . TETANUS/TDAP  06/25/2019  . URINE MICROALBUMIN  10/09/2019  . MAMMOGRAM  04/14/2020  . DEXA SCAN  Completed  . PNA vac Low Risk Adult  Completed    Cancer Screenings: Lung: Low Dose CT Chest recommended if Age 4-80 years, 30 pack-year currently smoking OR have quit w/in 15years. Patient does not qualify. Breast:  Up to date on Mammogram? Yes   Up to date of Bone Density/Dexa? Yes Colorectal:Up to date   Additional Screenings: Hepatitis C Screening: Low Risk      Plan:   - Dexa scan - she rescheduled  appointment to August 21 st 2020 due to right hip/back pain  - Request Advance Directive  - Colonoscopy due 03/26/2019 will discuss Cologuard with PCP   I have personally reviewed and noted the following in the patient's chart:   . Medical and social history . Use of alcohol, tobacco or illicit drugs  . Current medications and supplements . Functional ability and status . Nutritional status . Physical activity . Advanced directives . List of other physicians . Hospitalizations, surgeries, and ER visits in previous 12 months . Vitals . Screenings to include cognitive, depression, and falls . Referrals and appointments  In addition, I have reviewed and discussed with patient certain preventive protocols, quality metrics, and best practice recommendations. A written personalized care plan for preventive services as well as general preventive health recommendations were provided to patient.  Sandrea Hughs, NP  01/16/2019

## 2019-01-24 ENCOUNTER — Ambulatory Visit (INDEPENDENT_AMBULATORY_CARE_PROVIDER_SITE_OTHER): Payer: Medicare Other | Admitting: Family

## 2019-01-24 ENCOUNTER — Encounter: Payer: Self-pay | Admitting: Family

## 2019-01-24 ENCOUNTER — Other Ambulatory Visit: Payer: Self-pay

## 2019-01-24 DIAGNOSIS — M79661 Pain in right lower leg: Secondary | ICD-10-CM

## 2019-01-24 DIAGNOSIS — M7989 Other specified soft tissue disorders: Secondary | ICD-10-CM | POA: Diagnosis not present

## 2019-01-24 DIAGNOSIS — C4361 Malignant melanoma of right upper limb, including shoulder: Secondary | ICD-10-CM

## 2019-01-24 DIAGNOSIS — M353 Polymyalgia rheumatica: Secondary | ICD-10-CM | POA: Diagnosis not present

## 2019-01-24 NOTE — Patient Instructions (Signed)
Recommend to get worsening right leg swelling and pain in the ED.

## 2019-01-24 NOTE — Progress Notes (Signed)
Patient ID: [name], female   DOB: 1946-07-08, 73 y.o.   MRN: [id number] This service is provided via telemedicine  No vital signs collected/recorded due to the encounter was a telemedicine visit.   Location of patient (ex: home, work):  HOME  Patient consents to a telephone visit:  YES  Location of the provider (ex: office, home):  OFFICE  Name of any referring provider:  DR.TIFFANY REED, DO  Names of all persons participating in the telemedicine service and their role in the encounter:  PATIENT, [name], [place], [place], NP  Time spent on call:  5:09  Location:      Place of Service:    Provider: Vinie Charity FNP-C  Gayland Curry, DO  Patient Care Team: Gayland Curry, DO as PCP - General (Geriatric Medicine) Magrinat, Virgie Dad, MD as Consulting Physician (Oncology) Erroll Luna, MD as Consulting Physician (General Surgery) Harvie Heck, MD as Physician Assistant (Internal Medicine) Chyrel Masson, DO as Anesthesiologist (Otolaryngology) Clinger, Chrystie Nose, MD (Otolaryngology) Turner Daniels, MD as Referring Physician (Internal Medicine)  Extended Emergency Contact Information Primary Emergency Contact: Morre,Terry Address: [place], [place], [place] Phone: [phone] Work Phone: [phone] Mobile Phone: [phone] Relation: Son  Goals of care: Advanced Directive information Advanced Directives 01/16/2019  Does Patient Have a Medical Advance Directive? No  Would patient like information on creating a medical advance directive? No - Patient declined     Chief Complaint  Patient presents with  . Acute Visit    Bilateral legs swelling    HPI:  Pt is a 73 y.o. female seen today for an acute visit for evaluation of worsening bilateral leg swelling.she request a diuretic for worsening right leg swelling.she states swelling worst on right leg from the foot to groin area.she has also  developed left foot to ankle swelling but pain worst on right leg.she states has had a right leg U/S 10/12/2018 which showed no DVT.she does have chronic right hip pain that she needs hip replacement.she states had a kenalog shot in April,2020 but was  Ineffective.Also thinks her Norco might be causing the swelling on the legs so she takes half a pill every 8 hours but swelling is not resolved.she follows up with rheumatology for polymyalgia rheumatica and oncology for melanoma.States sed rate was 47 (01/10/2019) up from 33 (09/2018) her prednisone was adjusted by rheumatology.Has PET scan appointment 01/29/2019 due to cancer.ECHO schedule for  02/19/2019 to rule out CHF due to her chronic leg swelling.Also has upcoming MRI appointment 01/29/2019 per pt.she denies any fever,chills,cough,shortness of breath or wheezing.  Past Medical History:  Diagnosis Date  . Asymptomatic varicose veins   . Cancer (HCC)    breast   . Diverticulosis   . DM type 2 (diabetes mellitus, type 2) ([place]) 10/23/2014  . Dysuria   . GERD (gastroesophageal reflux disease)   . Hiatal hernia   . Hyperlipidemia   . Malignant neoplasm of breast (female), unspecified site   . Migraine without aura, without mention of intractable migraine without mention of status migrainosus   . Other abnormal blood chemistry   . Pain in joint, pelvic region and thigh   . Shingles    in eyes  . Stricture and stenosis of esophagus   . Trigger finger (acquired)   . Unspecified cataract   . Unspecified glaucoma(365.9)    Past Surgical History:  Procedure Laterality Date  .  APPENDECTOMY  1973   Dr. Linzie Collin  . BREAST LUMPECTOMY Right 08/29/2008   Dr. Erroll Luna  . ESOPHAGEAL DILATION    . NASAL POLYP EXCISION  10/2015  . POLYPECTOMY  11/20/14   Uterine Polyp Removed    Allergies  Allergen Reactions  . Adhesive [Tape] Dermatitis  . Betimol [Timolol Maleate]     Allergic to preservatives   . Cosopt [Dorzolamide Hcl-Timolol Mal] Other (See  Comments)    Turns eye red, increases eye pressure  . Penicillins   . Prednisolone Acetate Other (See Comments)    Caused increased eye pressure  . Tamoxifen Other (See Comments)    Vaginal bleeding  . Thimerosal     Makes pt eye red     Outpatient Encounter Medications as of 01/24/2019  Medication Sig  . Ascorbic Acid (VITAMIN C) 1000 MG tablet Take 1,000 mg by mouth daily.  . Calcium Citrate-Vitamin D 250-200 MG-UNIT TABS Take 250 mg by mouth daily.   . Cholecalciferol (VITAMIN D-3) 5000 units TABS Take by mouth daily.  . Cyanocobalamin (B-12) 5000 MCG CAPS Take by mouth daily.  . fluorometholone (FML) 0.1 % ophthalmic suspension Place 1 drop into the right eye daily.  Marland Kitchen HYDROcodone-acetaminophen (NORCO) 10-325 MG tablet Take 1 tablet by mouth every 8 (eight) hours as needed for severe pain.  Marland Kitchen LORazepam (ATIVAN) 1 MG tablet TAKE 1/2 TABLET BY MOUTH AT BEDTIME FOR SLEEP  . Magnesium 250 MG TABS Take 2 tablets by mouth daily.  . Multiple Vitamin (MULTIVITAMIN) capsule Take 1 capsule by mouth daily.  Marland Kitchen omeprazole (PRILOSEC) 40 MG capsule Take 40 mg by mouth.  . predniSONE (DELTASONE) 10 MG tablet Take 10 mg by mouth daily. With the 5mg   . predniSONE (DELTASONE) 5 MG tablet Take 5 mg by mouth daily with breakfast. Take along with the 10mg  tablet  . timolol (TIMOPTIC) 0.5 % ophthalmic solution PLACE 1 DROP INTO THE RIGHT EYE 2 TIMES DAILY.  . valACYclovir (VALTREX) 1000 MG tablet Take 1,000 mg by mouth daily.   . [DISCONTINUED] Ascorbic Acid (VITAMIN C) 100 MG tablet Take 100 mg by mouth daily.  . [DISCONTINUED] Ca Cit-Vit D-Vit K-Minerals (ADVANCED CALCIUM FORMULA) 200 MG TABS Take by mouth daily.    No facility-administered encounter medications on file as of 01/24/2019.     Review of Systems  Constitutional: Negative for chills, fatigue and fever.  HENT: Negative for congestion, rhinorrhea, sinus pressure, sinus pain, sneezing and sore throat.   Eyes: Negative for discharge,  redness, itching and visual disturbance.  Respiratory: Negative for cough, chest tightness, shortness of breath and wheezing.   Cardiovascular: Positive for leg swelling. Negative for chest pain and palpitations.  Gastrointestinal: Negative for abdominal distention, abdominal pain, constipation, diarrhea, nausea and vomiting.  Genitourinary: Negative for difficulty urinating, dysuria, flank pain and urgency.  Musculoskeletal: Positive for arthralgias, back pain and gait problem.       Right hip pain down to groin rates 10/10 without the pain medication.Pain and swelling affecting her walking.   Skin: Negative for color change, pallor and rash.  Neurological: Negative for dizziness, seizures, speech difficulty, light-headedness and numbness.       Intermittent headache right side but none today.     Immunization History  Administered Date(s) Administered  . Influenza Whole 07/26/2010  . Influenza,inj,Quad PF,6+ Mos 04/24/2014, 04/01/2015, 05/05/2016  . Influenza-Unspecified 06/30/2010, 04/25/2013, 05/31/2018  . Pneumococcal Conjugate-13 10/23/2014  . Pneumococcal Polysaccharide-23 10/29/2016  . Td 06/24/2009  . Tdap 07/26/2008  .  Zoster 10/17/2013   Pertinent  Health Maintenance Due  Topic Date Due  . INFLUENZA VACCINE  02/24/2019  . COLONOSCOPY  03/26/2019  . URINE MICROALBUMIN  10/09/2019  . MAMMOGRAM  04/14/2020  . DEXA SCAN  Completed  . PNA vac Low Risk Adult  Completed   Fall Risk  01/24/2019 01/16/2019 10/09/2018 06/05/2018 04/07/2018  Falls in the past year? 0 0 0 0 No  Number falls in past yr: 0 0 0 0 -  Injury with Fall? 0 0 0 0 -   There were no vitals filed for this visit. There is no height or weight on file to calculate BMI. Physical Exam Unable to complete on telephone visit.   Labs reviewed: Recent Labs    10/09/18 1405  NA 138  K 4.5  CL 99  CO2 29  GLUCOSE 119  BUN 17  CREATININE 0.62  CALCIUM 10.3    Lab Results  Component Value Date   HGBA1C 6.0  (H) 10/09/2018   Lab Results  Component Value Date   CHOL 236 (H) 10/29/2016   HDL 60 10/29/2016   LDLCALC 146 (H) 10/29/2016   TRIG 149 10/29/2016   CHOLHDL 3.9 10/29/2016    Significant Diagnostic Results in last 30 days:  No results found.  Assessment/Plan 1. Pain and swelling of right lower leg Has worsening leg swelling from foot to groin area request for a diuretic.Denies cough ,wheezing or shortness of breath.I've discussed with her that due to worsening pain and swelling whole leg I would recommend her to be evaluated in the ED to rule out DVT or possible malignancy verse fracture on the hip.Patient verbalized understanding but declines to go to ED would like to wait until her scheduled MRI on 01/29/2019.she states there's nothing they can do for her in the ED.   2. Polymyalgia rheumatica (HCC) Continue on Prednisone and follow up with Rheumatology as directed.   3. Malignant melanoma of right upper extremity ([place]) Continue to follow up with oncology.ECHO and PET scan as directed.  Family/ staff Communication: Reviewed plan of care with patient.   Labs/tests ordered: None recommended to be evaluated in the ED for worsening right leg pain and swelling but she declined to go to ED.   Time spent with patient 24 minutes >50% time spent counseling; reviewing medical record; tests; labs; and developing future plan of care  Sandrea Hughs, NP

## 2019-01-29 DIAGNOSIS — C799 Secondary malignant neoplasm of unspecified site: Secondary | ICD-10-CM | POA: Diagnosis not present

## 2019-01-29 DIAGNOSIS — R948 Abnormal results of function studies of other organs and systems: Secondary | ICD-10-CM | POA: Diagnosis not present

## 2019-01-29 DIAGNOSIS — C4361 Malignant melanoma of right upper limb, including shoulder: Secondary | ICD-10-CM | POA: Diagnosis not present

## 2019-02-01 ENCOUNTER — Telehealth: Payer: Self-pay | Admitting: *Deleted

## 2019-02-01 ENCOUNTER — Other Ambulatory Visit: Payer: Self-pay | Admitting: *Deleted

## 2019-02-01 DIAGNOSIS — M5431 Sciatica, right side: Secondary | ICD-10-CM

## 2019-02-01 MED ORDER — HYDROCODONE-ACETAMINOPHEN 10-325 MG PO TABS: 1.0000 | ORAL_TABLET | Freq: Three times a day (TID) | ORAL | 0 refills | Status: DC | PRN

## 2019-02-01 NOTE — Telephone Encounter (Signed)
Patient requested refill  [place] Verified LR: 01/03/2019 Pended Rx and sent to Dr. Mariea Clonts for approval.

## 2019-02-01 NOTE — Telephone Encounter (Signed)
[facility] Pharmacist notified and agreed. Will fill

## 2019-02-01 NOTE — Telephone Encounter (Signed)
I'm aware of the interaction.  Patient has pain and needs the hydrocodone.  She has anxiety and needs the lorazepam.

## 2019-02-06 DIAGNOSIS — M19072 Primary osteoarthritis, left ankle and foot: Secondary | ICD-10-CM | POA: Diagnosis not present

## 2019-02-06 DIAGNOSIS — Z4789 Encounter for other orthopedic aftercare: Secondary | ICD-10-CM | POA: Diagnosis not present

## 2019-02-06 DIAGNOSIS — M7732 Calcaneal spur, left foot: Secondary | ICD-10-CM | POA: Diagnosis not present

## 2019-02-06 DIAGNOSIS — M47818 Spondylosis without myelopathy or radiculopathy, sacral and sacrococcygeal region: Secondary | ICD-10-CM | POA: Diagnosis not present

## 2019-02-06 DIAGNOSIS — M8588 Other specified disorders of bone density and structure, other site: Secondary | ICD-10-CM | POA: Diagnosis not present

## 2019-02-06 DIAGNOSIS — M7989 Other specified soft tissue disorders: Secondary | ICD-10-CM | POA: Diagnosis not present

## 2019-02-06 DIAGNOSIS — M47816 Spondylosis without myelopathy or radiculopathy, lumbar region: Secondary | ICD-10-CM | POA: Diagnosis not present

## 2019-02-06 DIAGNOSIS — C4361 Malignant melanoma of right upper limb, including shoulder: Secondary | ICD-10-CM | POA: Diagnosis not present

## 2019-02-06 DIAGNOSIS — M79672 Pain in left foot: Secondary | ICD-10-CM | POA: Diagnosis not present

## 2019-02-06 DIAGNOSIS — R9389 Abnormal findings on diagnostic imaging of other specified body structures: Secondary | ICD-10-CM | POA: Diagnosis not present

## 2019-02-06 DIAGNOSIS — M25551 Pain in right hip: Secondary | ICD-10-CM | POA: Diagnosis not present

## 2019-02-06 DIAGNOSIS — M160 Bilateral primary osteoarthritis of hip: Secondary | ICD-10-CM | POA: Diagnosis not present

## 2019-02-12 ENCOUNTER — Other Ambulatory Visit: Payer: Self-pay

## 2019-02-12 ENCOUNTER — Encounter: Payer: Self-pay | Admitting: Internal Medicine

## 2019-02-12 ENCOUNTER — Ambulatory Visit (INDEPENDENT_AMBULATORY_CARE_PROVIDER_SITE_OTHER): Payer: Medicare Other | Admitting: Internal Medicine

## 2019-02-12 VITALS — BP 120/70 | HR 78 | Temp 98.4°F | Ht 64.0 in | Wt 249.0 lb

## 2019-02-12 DIAGNOSIS — Z6839 Body mass index (BMI) 39.0-39.9, adult: Secondary | ICD-10-CM | POA: Diagnosis not present

## 2019-02-12 DIAGNOSIS — M353 Polymyalgia rheumatica: Secondary | ICD-10-CM

## 2019-02-12 DIAGNOSIS — E6609 Other obesity due to excess calories: Secondary | ICD-10-CM

## 2019-02-12 DIAGNOSIS — R1031 Right lower quadrant pain: Secondary | ICD-10-CM | POA: Diagnosis not present

## 2019-02-12 DIAGNOSIS — C4361 Malignant melanoma of right upper limb, including shoulder: Secondary | ICD-10-CM | POA: Diagnosis not present

## 2019-02-12 DIAGNOSIS — M5431 Sciatica, right side: Secondary | ICD-10-CM

## 2019-02-12 DIAGNOSIS — R600 Localized edema: Secondary | ICD-10-CM

## 2019-02-12 MED ORDER — FUROSEMIDE 20 MG PO TABS: 20.0000 mg | ORAL_TABLET | Freq: Every day | ORAL | 0 refills | Status: DC | PRN

## 2019-02-12 MED ORDER — DULOXETINE HCL 20 MG PO CPEP: 20.0000 mg | ORAL_CAPSULE | Freq: Every day | ORAL | 3 refills | Status: DC

## 2019-02-12 MED ORDER — HYDROCODONE-ACETAMINOPHEN 10-325 MG PO TABS: 1.0000 | ORAL_TABLET | Freq: Four times a day (QID) | ORAL | 0 refills | Status: DC | PRN

## 2019-02-12 NOTE — Progress Notes (Signed)
Location:  [facility] clinic Provider:  Shyteria Lewis L. Mariea Clonts, D.O., C.M.D.  Goals of Care:  Advanced Directives 01/16/2019  Does Patient Have a Medical Advance Directive? No  Would patient like information on creating a medical advance directive? No - Patient declined     Chief Complaint  Patient presents with  . Medical Management of Chronic Issues    91mth follow-up    HPI: Patient is a 73 y.o. female seen today for medical management of chronic diseases.    [place] of right leg did not show any DVT.  Right leg stays swollen and shiny.  Also up into her thigh.  Three weeks ago, the left started to swell.  She cannot move the right leg.  Her whole body feels locked down and stiffened up.   Right hip hurts through medial groin.  Had kenalog shot early April or May.  No benefit.  Has seen rheum also.  They thought maybe prednisone--was down to 9mg , but last week, went back up to 16mg .  Don't want to go above 20mg .    She is now to see cardiology to work-up heart for echo.    Rheum contacted Dr. Kendall Flack, oncology.  He did a new PET scan and it showed a spot in her left foot.  She was by ortho oncology surgery at [facility] and she didn't feel anything.  Xrays didn't show anything there.  She has to f/u in 3 months, but to come back sooner if anything happened.  The inside of her foot ha been looking a little red.    She's using a walker at home and for the past two weeks, taking a wheelchair for her appts.  Her son is with her today.    She is back to using her hydrocodone every 6 hrs and it's not doing the trick.  The last few hours, it wears off.  If she took half twice as often, she never got rid of pain.    The right leg is so stiff she can't move it.  She can't lay on her back of sides for any length of time.  Will sit on couch and stretch out the leg.  There was a discussion about a hip replacement, but she was told she has to lose 30 lbs and there are concerns about ability to heal.    She was  having headache in the right posterior neck.  That started first of Jan.  Had MRI--showed arthritis in her neck.    She has used some ibuprofen in between the hydrocodone.  She's had a prior ulcer in her stomach.    Past Medical History:  Diagnosis Date  . Asymptomatic varicose veins   . Cancer (HCC)    breast   . Diverticulosis   . DM type 2 (diabetes mellitus, type 2) ([place]) 10/23/2014  . Dysuria   . GERD (gastroesophageal reflux disease)   . Hiatal hernia   . Hyperlipidemia   . Malignant neoplasm of breast (female), unspecified site   . Migraine without aura, without mention of intractable migraine without mention of status migrainosus   . Other abnormal blood chemistry   . Pain in joint, pelvic region and thigh   . Shingles    in eyes  . Stricture and stenosis of esophagus   . Trigger finger (acquired)   . Unspecified cataract   . Unspecified glaucoma(365.9)     Past Surgical History:  Procedure Laterality Date  . APPENDECTOMY  1973   Dr.  Clutts  . BREAST LUMPECTOMY Right 08/29/2008   Dr. Erroll Luna  . ESOPHAGEAL DILATION    . NASAL POLYP EXCISION  10/2015  . POLYPECTOMY  11/20/14   Uterine Polyp Removed    Allergies  Allergen Reactions  . Adhesive [Tape] Dermatitis  . Betimol [Timolol Maleate]     Allergic to preservatives   . Cosopt [Dorzolamide Hcl-Timolol Mal] Other (See Comments)    Turns eye red, increases eye pressure  . Penicillins   . Prednisolone Acetate Other (See Comments)    Caused increased eye pressure  . Tamoxifen Other (See Comments)    Vaginal bleeding  . Thimerosal     Makes pt eye red     Outpatient Encounter Medications as of 02/12/2019  Medication Sig  . Ascorbic Acid (VITAMIN C) 1000 MG tablet Take 1,000 mg by mouth daily.  . Calcium Citrate-Vitamin D 250-200 MG-UNIT TABS Take 250 mg by mouth daily.   . Cholecalciferol (VITAMIN D-3) 5000 units TABS Take by mouth daily.  . Cyanocobalamin (B-12) 5000 MCG CAPS Take by mouth daily.   . fluorometholone (FML) 0.1 % ophthalmic suspension Place 1 drop into the right eye daily.  Marland Kitchen HYDROcodone-acetaminophen (NORCO) 10-325 MG tablet Take 1 tablet by mouth every 8 (eight) hours as needed for severe pain.  Marland Kitchen LORazepam (ATIVAN) 1 MG tablet TAKE 1/2 TABLET BY MOUTH AT BEDTIME FOR SLEEP  . Magnesium 250 MG TABS Take 2 tablets by mouth daily.  . Multiple Vitamin (MULTIVITAMIN) capsule Take 1 capsule by mouth daily.  Marland Kitchen omeprazole (PRILOSEC) 40 MG capsule Take 40 mg by mouth.  . predniSONE (DELTASONE) 1 MG tablet Take 1 mg by mouth as directed.  . predniSONE (DELTASONE) 10 MG tablet Take 10 mg by mouth daily. With the 5mg   . predniSONE (DELTASONE) 5 MG tablet Take 5 mg by mouth daily with breakfast. Take along with the 10mg  tablet  . timolol (TIMOPTIC) 0.5 % ophthalmic solution PLACE 1 DROP INTO THE RIGHT EYE 2 TIMES DAILY.  . valACYclovir (VALTREX) 1000 MG tablet Take 1,000 mg by mouth daily.    No facility-administered encounter medications on file as of 02/12/2019.     Review of Systems:  Review of Systems  Constitutional: Negative for chills, fever and malaise/fatigue.  HENT: Negative for congestion and hearing loss.   Eyes: Negative for blurred vision.  Respiratory: Negative for cough and shortness of breath.   Cardiovascular: Positive for leg swelling. Negative for chest pain, palpitations, orthopnea and PND.  Gastrointestinal: Negative for abdominal pain, blood in stool, constipation, diarrhea and melena.  Genitourinary: Negative for dysuria.  Musculoskeletal: Positive for back pain, joint pain, myalgias and neck pain. Negative for falls.  Skin: Negative for itching and rash.  Neurological: Positive for focal weakness. Negative for dizziness, sensory change and loss of consciousness.       Right leg is weak in flexion at hip  Endo/Heme/Allergies: Bruises/bleeds easily.  Psychiatric/Behavioral: Positive for depression. Negative for memory loss. The patient is nervous/anxious.  The patient does not have insomnia.        Became quite tearful today about her loss of function and increased pain    Health Maintenance  Topic Date Due  . Hepatitis C Screening  12-Sep-1945  . INFLUENZA VACCINE  02/24/2019  . COLONOSCOPY  03/26/2019  . TETANUS/TDAP  06/25/2019  . URINE MICROALBUMIN  10/09/2019  . MAMMOGRAM  04/14/2020  . DEXA SCAN  Completed  . PNA vac Low Risk Adult  Completed    Physical  Exam: Vitals:   02/12/19 1323  BP: 120/70  Pulse: 78  Temp: 98.4 F (36.9 C)  TempSrc: Oral  SpO2: 96%  Weight: 249 lb (112.9 kg)  Height: 5\' 4"  (1.626 m)   Body mass index is 42.74 kg/m. Physical Exam Constitutional:      Appearance: She is obese. She is not ill-appearing or toxic-appearing.  HENT:     Head: Normocephalic and atraumatic.  Cardiovascular:     Rate and Rhythm: Normal rate and regular rhythm.     Pulses: Normal pulses.     Heart sounds: Normal heart sounds.  Pulmonary:     Effort: Pulmonary effort is normal.     Breath sounds: Normal breath sounds. No wheezing, rhonchi or rales.  Abdominal:     General: Bowel sounds are normal.  Musculoskeletal:     Comments: Unable to flex right hip to lift foot off floor and step onto stair of exam table; also unable to flex it when sitting in the chair; has right groin tenderness and right SI region tenderness; notable nonpitting edema of right greater than left lower leg and foot; also has right deformity from prior trauma to right leg; no tenderness of left metatarsal region (PET positive area on [facility])  Skin:    General: Skin is warm and dry.     Capillary Refill: Capillary refill takes less than 2 seconds.     Coloration: Skin is pale.  Neurological:     Mental Status: She is alert and oriented to person, place, and time. Mental status is at baseline.     Motor: Weakness present.     Gait: Gait abnormal.     Comments: Came to visit in wheelchair (son transported from car into office)   Psychiatric:     Comments: Tearful when talking about recent decline in function and increased pain     Labs reviewed: Basic Metabolic Panel: Recent Labs    10/09/18 1405  NA 138  K 4.5  CL 99  CO2 29  GLUCOSE 119  BUN 17  CREATININE 0.62  CALCIUM 10.3   Liver Function Tests: No results for input(s): AST, ALT, ALKPHOS, BILITOT, PROT, ALBUMIN in the last 8760 hours. No results for input(s): LIPASE, AMYLASE in the last 8760 hours. No results for input(s): AMMONIA in the last 8760 hours. CBC: No results for input(s): WBC, NEUTROABS, HGB, HCT, MCV, PLT in the last 8760 hours. Lipid Panel: No results for input(s): CHOL, HDL, LDLCALC, TRIG, CHOLHDL, LDLDIRECT in the last 8760 hours. Lab Results  Component Value Date   HGBA1C 6.0 (H) 10/09/2018    Procedures since last visit: Reviewed records in care everywhere from [facility]   Assessment/Plan 1. 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  Dispense: 30 capsule; Refill: 3 to help with pain and depression/anxiety  2. Sciatica of right side - previously diagnosed by other provider - DULoxetine (CYMBALTA) 20 MG capsule; Take 1 capsule (20 mg total) by mouth daily.  Dispense: 30 capsule; Refill: 3 - HYDROcodone-acetaminophen (NORCO) 10-325 MG tablet; Take 1 tablet by mouth every 6 (six) hours as needed for severe pain.  Dispense: 120 tablet; Refill: 0  3. Localized edema - will try her on low dose lasix--to eat a banana when she uses it  as needed for edema - furosemide (LASIX) 20 MG tablet; Take 1 tablet (20 mg total) by mouth daily as needed for edema.  Dispense: 30 tablet; Refill: 0  4. Class 2 obesity due to excess calories without serious comorbidity with body mass index (BMI) of 39.0 to 39.9 in adult -has been referred from  [facility] to a weight loss program -will try to jumpstart this with her PT to get her more mobile and pain mgt as above  5. Polymyalgia rheumatica (HCC) -cont prednisone 16mg  as she's taking--not affecting her current pain in the right groin, however--looks like she needs a hip replacement, but is not a candidate with her weight and poor mobility at present/other pains  6. Malignant melanoma of right upper extremity ([place]) -at wrist--sees heme/onc regularly at [facility] for this -last PET now shows left foot lighting up and she has f/u with ortho oncology in 3 more months to reassess and is monitoring for changes in her foot--none visible to me today--she thinks it might be a little red medially  F/u in 4 weeks re pain control (once cymbalta should begin to work)  Next appt:  03/15/2019  Bristol Soy L. Jaima Janney, D.O. [facility] [address], [place] [address] Cell Phone (Mon-Fri 8am-5pm):  [phone] On Call:  [phone] & follow prompts after 5pm & weekends Office Phone:  [phone] Office Fax:  [phone]

## 2019-02-14 ENCOUNTER — Telehealth: Payer: Self-pay | Admitting: *Deleted

## 2019-02-14 NOTE — Telephone Encounter (Signed)
Patient called and stated that she was seen on Monday and given a new medication. Stated that it is NOT working, still in pain and stated it felt like her body was crawling and she couldn't even walk good.  Took the first Duloxetine yesterday around 6:30 pm. Stated that it did help her alittle bit but around 2:30am it had wore off and her head felt weird. Took the Hydrocodone and it got alittle better but it only last for about 2 hours. Patient feels like she cannot take the Duloxetine. Please Advise.

## 2019-02-14 NOTE — Telephone Encounter (Signed)
Spoke with patient and advised results, she will try the cymbalta again and increase the hydrocodone.

## 2019-02-14 NOTE — Telephone Encounter (Signed)
The duloxetine is not going to work immediately--it takes 4 weeks to reach full benefit as we discussed at her visit.  I had increased her hydrocodone to what she was actually already taking on her own from how it was prescribed.  I'd like her to keep taking the cymbalta.  If she takes her hydrocodone more often than I've prescribed it, the pharmacy may not fill it early even when I do change it; but, let's try having her take hydrocodone every 4 hours instead of every 6 hrs since it's not lasting her.

## 2019-02-19 DIAGNOSIS — I358 Other nonrheumatic aortic valve disorders: Secondary | ICD-10-CM | POA: Diagnosis not present

## 2019-02-19 DIAGNOSIS — I313 Pericardial effusion (noninflammatory): Secondary | ICD-10-CM | POA: Diagnosis not present

## 2019-02-19 DIAGNOSIS — R600 Localized edema: Secondary | ICD-10-CM | POA: Diagnosis not present

## 2019-02-19 DIAGNOSIS — M7989 Other specified soft tissue disorders: Secondary | ICD-10-CM | POA: Diagnosis not present

## 2019-02-22 ENCOUNTER — Ambulatory Visit: Payer: Medicare Other | Attending: Internal Medicine | Admitting: Physical Therapy

## 2019-02-22 ENCOUNTER — Other Ambulatory Visit: Payer: Self-pay

## 2019-02-22 ENCOUNTER — Encounter: Payer: Self-pay | Admitting: Physical Therapy

## 2019-02-22 DIAGNOSIS — M25552 Pain in left hip: Secondary | ICD-10-CM | POA: Diagnosis not present

## 2019-02-22 DIAGNOSIS — R262 Difficulty in walking, not elsewhere classified: Secondary | ICD-10-CM | POA: Insufficient documentation

## 2019-02-22 DIAGNOSIS — M6281 Muscle weakness (generalized): Secondary | ICD-10-CM | POA: Diagnosis not present

## 2019-02-22 DIAGNOSIS — M25551 Pain in right hip: Secondary | ICD-10-CM | POA: Insufficient documentation

## 2019-02-22 NOTE — Therapy (Signed)
[address] [place], [place], [address] Phone: [phone]   Fax:  [phone]  Physical Therapy Evaluation  Patient Details  Name: [name] MRN: [id number] Date of Birth: 11/08/1945 Referring Provider (PT): Hollace Kinnier   Encounter Date: 02/22/2019  PT End of Session - 02/22/19 1440    Visit Number  1    Date for PT Re-Evaluation  04/25/19    PT Start Time  1400    PT Stop Time  1441    PT Time Calculation (min)  41 min    Activity Tolerance  Patient limited by pain;Patient limited by fatigue    Behavior During Therapy  [facility] for tasks assessed/performed       Past Medical History:  Diagnosis Date  . Asymptomatic varicose veins   . Cancer (HCC)    breast   . Diverticulosis   . DM type 2 (diabetes mellitus, type 2) ([place]) 10/23/2014  . Dysuria   . GERD (gastroesophageal reflux disease)   . Hiatal hernia   . Hyperlipidemia   . Malignant neoplasm of breast (female), unspecified site   . Migraine without aura, without mention of intractable migraine without mention of status migrainosus   . Other abnormal blood chemistry   . Pain in joint, pelvic region and thigh   . Shingles    in eyes  . Stricture and stenosis of esophagus   . Trigger finger (acquired)   . Unspecified cataract   . Unspecified glaucoma(365.9)     Past Surgical History:  Procedure Laterality Date  . APPENDECTOMY  1973   Dr. Linzie Collin  . BREAST LUMPECTOMY Right 08/29/2008   Dr. Erroll Luna  . ESOPHAGEAL DILATION    . NASAL POLYP EXCISION  10/2015  . POLYPECTOMY  11/20/14   Uterine Polyp Removed    There were no vitals filed for this visit.   Subjective Assessment - 02/22/19 1408    Subjective  Patient reports pain in the hips for a number of years, she reports that she has been told she needs a joint replacement.  She uses two canes to walk and reports that she is having a lot of difficulty walking and doing things  around the house    Pertinent History  DM, glaucoma, reports a myriad of other issues with her health, GERD    How long can you stand comfortably?  1 minutes    How long can you walk comfortably?  25 ffet    Patient Stated Goals  have less pain and walk better    Currently in Pain?  Yes    Pain Score  5     Pain Location  Hip    Pain Orientation  Right;Left    Pain Descriptors / Indicators  Aching;Tender    Pain Type  Chronic pain    Pain Radiating Towards  has some right groin pain    Pain Onset  More than a month ago    Pain Frequency  Constant    Aggravating Factors   standing, moving the legs, walking all will increase pain and pain can be a 10/10    Pain Relieving Factors  rest, pain meds at best pain will be 4/10    Effect of Pain on Daily Activities  limits all ADL's especially walking         [facility] PT Assessment - 02/22/19 0001      Assessment   Medical Diagnosis  bilateral hip pain  Referring Provider (PT)  Tiffany Reed    Onset Date/Surgical Date  01/23/19    Prior Therapy  over a year ago, she reports DN helped      Precautions   Precautions  None      Balance Screen   Has the patient fallen in the past 6 months  No    Has the patient had a decrease in activity level because of a fear of falling?   No    Is the patient reluctant to leave their home because of a fear of falling?   No      Home Environment   Additional Comments  has a ramp into the home, does some cooking      Prior Function   Level of Independence  Independent with household mobility with device    Vocation  Retired    Leisure  no exercise      Observation/Other Assessments-Edema    Edema  --   she has edema of thighs and below     ROM / Strength   AROM / PROM / Strength  AROM;Strength      AROM   Overall AROM Comments  lumbar ROM decreased 50%, right hip flexion standing 20 degrees, has to use hands to lift right leg to get in and out of car and bed      Strength   Overall Strength  Comments  right hip 3-/5, left hip 3+/5      Flexibility   Soft Tissue Assessment /Muscle Length  --   very tight in the hips, minimal ROM     Palpation   Palpation comment  she is tight and tender in the ITB, the bilateral buttocks the lumbar area and the bilateral adductors      Ambulation/Gait   Gait Comments  uses a walker or two canes, forward flexed trunk, very slow, WBOS      Standardized Balance Assessment   Standardized Balance Assessment  Timed Up and Go Test      Timed Up and Go Test   Normal TUG (seconds)  42                Objective measurements completed on examination: See above findings.        Trigger Point Dry Needling - 02/22/19 0001    Consent Given?  Yes    Education Handout Provided  Yes    Muscles Treated Back/Hip  Piriformis;Gluteus maximus    Gluteus Maximus Response  Palpable increased muscle length    Piriformis Response  Palpable increased muscle length             PT Short Term Goals - 02/22/19 1533      PT SHORT TERM GOAL #1   Title  Patient to be independent with appopriate and progressive HEP, to be updated PRN     Time  2    Period  Weeks    Status  New        PT Long Term Goals - 02/22/19 1533      PT LONG TERM GOAL #1   Title  Patient to demonstrate right hip as being no more than 30% limited in flexibility all planes in order to allow her to more easily perform functional tasks such as putting shoes and socks on    Time  8    Period  Weeks    Status  New      PT LONG TERM GOAL #2   Title  Patient to demonstrate improved gait mechanics including improved hip rotation and mobility during gait, equal step/stance times, and upright posture in order to improve quality of motion and reduce pain     Time  8    Period  Weeks    Status  New      PT LONG TERM GOAL #3   Title  Patient to demonstrate correct functional biomechanics for tasks such as bed mobility, loading/unloading drier, and getting up into high car  in order to prevent exacerbation of pain     Time  8    Period  Weeks    Status  New      PT LONG TERM GOAL #4   Title  Patient to be able to stand and ambulate without increase in pain for at least 60 minutes in order to improve QOL and assist in return to prior level of activity     Time  [place] - 02/22/19 1441    Clinical Impression Statement  Patient is having hip pain over the past two years, she has pain in both hips.  She reports that over the past 6 months she has really regressed, with more difficukty walking and getting in and out of the car.  She has a lot of tenderness in the hips and into the groin.  She reports that she had great results in the past from DN.    Personal Factors and Comorbidities  Comorbidity 3+    Comorbidities  GERD, DM, Glaucoma, obestiy    Stability/Clinical Decision Making  Evolving/Moderate complexity    Clinical Decision Making  Moderate    Rehab Potential  Good    PT Frequency  2x / week    PT Duration  8 weeks    PT Treatment/Interventions  ADLs/Self Care Home Management;Cryotherapy;Electrical Stimulation;Iontophoresis 34m/ml Dexamethasone;Moist Heat;Ultrasound;Therapeutic activities;Functional mobility training;Gait training;Therapeutic exercise;Balance training;Neuromuscular re-education;Patient/family education;Dry needling    PT Next Visit Plan  slowly start exercises, DN and modalities    Consulted and Agree with Plan of Care  Patient       Patient will benefit from skilled therapeutic intervention in order to improve the following deficits and impairments:  Abnormal gait, Cardiopulmonary status limiting activity, Decreased activity tolerance, Decreased endurance, Decreased range of motion, Decreased strength, Difficulty walking, Increased edema, Decreased balance, Decreased mobility, Impaired flexibility, Obesity, Increased muscle spasms, Pain, Improper body mechanics  Visit Diagnosis: 1.  Pain in right hip   2. Pain in left hip   3. Muscle weakness (generalized)   4. Difficulty in walking, not elsewhere classified        Problem List Patient Active Problem List   Diagnosis Date Noted  . [place] ([place] 10/09/[place]  . BMI 40.0-44.9, adult ([place] 10/09/2018  . Foraminal stenosis of lumbar region 05/11/2018  . 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 On prednisone therapy 02/14/2018  . Herpes zoster 10/16/2017  . Chronic pain of both shoulders 08/10/2017  . Prediabetes 05/26/2017  . Primary osteoarthritis of both hips 03/15/2017  . Trochanteric bursitis of left hip 03/15/2017  . Recurrent epistaxis 12/09/2016  . Dysuria 11/03/2016  . Insomnia 11/03/2016  . Glaucoma 05/05/2016  . LPRD (laryngopharyngeal reflux disease) 01/26/2016  . Erosive esophagitis 11/27/2015  . Schatzki's ring of distal esophagus 11/27/2015  . Chronic sinusitis 10/16/2015  . Deviated nasal septum 10/16/2015  .  Nasal polyp 10/16/2015  . Osteoporosis 08/15/2015  . Dysphagia, pharyngoesophageal phase 04/01/2015  . Endometrial polyp 10/24/2014  . Fatigue 07/09/2014  . Hyperlipidemia   . Breast cancer, right ([place]) 02/01/2014  . Malignant melanoma of arm ([place]) 11/23/2013  . Nasal septum ulceration 10/16/2013  . Seborrheic keratosis 10/16/2013  . Vitamin D deficiency 02/08/2013  . Sciatica of right side 01/24/2013  . Esophageal ulcer 01/24/2013  . Esophageal stenosis 01/24/2013  . Choroidal nevus of right eye 10/27/2012  . Impacted cerumen of right ear 10/11/2012  . Frequency 01/19/2012  . Gastroesophageal reflux disease 09/01/2011  . 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 H/O cataract extraction 04/30/2011  . Borderline glaucoma steroid responder 04/30/2011  . Obesity 10/16/2008  . Breast cancer of lower-outer quadrant of right female breast ([place]) 08/29/2008    Sumner Boast.,  PT 02/22/2019, 3:35 PM  [address] [place], [place], [address] Phone: [phone]   Fax:  [phone]  Name: [name] MRN: [id number] Date of Birth: March 27, 1946

## 2019-02-22 NOTE — Patient Instructions (Signed)

## 2019-02-26 ENCOUNTER — Ambulatory Visit: Payer: Medicare Other | Admitting: Physical Therapy

## 2019-03-01 ENCOUNTER — Ambulatory Visit: Payer: Medicare Other | Attending: Internal Medicine | Admitting: Physical Therapy

## 2019-03-01 ENCOUNTER — Other Ambulatory Visit: Payer: Self-pay

## 2019-03-01 DIAGNOSIS — M25552 Pain in left hip: Secondary | ICD-10-CM

## 2019-03-01 DIAGNOSIS — M25551 Pain in right hip: Secondary | ICD-10-CM

## 2019-03-01 DIAGNOSIS — M6281 Muscle weakness (generalized): Secondary | ICD-10-CM | POA: Diagnosis not present

## 2019-03-01 DIAGNOSIS — M25652 Stiffness of left hip, not elsewhere classified: Secondary | ICD-10-CM

## 2019-03-01 DIAGNOSIS — R262 Difficulty in walking, not elsewhere classified: Secondary | ICD-10-CM

## 2019-03-01 NOTE — Therapy (Signed)
[place], [place], [address] Phone: [phone]   Fax:  [phone]  Physical Therapy Treatment  Patient Details  Name: [name] MRN: [id number] Date of Birth: June 18, 1946 Referring Provider (PT): Hollace Kinnier   Encounter Date: 03/01/2019  PT End of Session - 03/01/19 1558    Visit Number  2    Date for PT Re-Evaluation  04/25/19    PT Start Time  1520    PT Stop Time  1610    PT Time Calculation (min)  50 min       Past Medical History:  Diagnosis Date  . Asymptomatic varicose veins   . Cancer (HCC)    breast   . Diverticulosis   . DM type 2 (diabetes mellitus, type 2) ([place]) 10/23/2014  . Dysuria   . GERD (gastroesophageal reflux disease)   . Hiatal hernia   . Hyperlipidemia   . Malignant neoplasm of breast (female), unspecified site   . Migraine without aura, without mention of intractable migraine without mention of status migrainosus   . Other abnormal blood chemistry   . Pain in joint, pelvic region and thigh   . Shingles    in eyes  . Stricture and stenosis of esophagus   . Trigger finger (acquired)   . Unspecified cataract   . Unspecified glaucoma(365.9)     Past Surgical History:  Procedure Laterality Date  . APPENDECTOMY  1973   Dr. Linzie Collin  . BREAST LUMPECTOMY Right 08/29/2008   Dr. Erroll Luna  . ESOPHAGEAL DILATION    . NASAL POLYP EXCISION  10/2015  . POLYPECTOMY  11/20/14   Uterine Polyp Removed    There were no vitals filed for this visit.  Subjective Assessment - 03/01/19 1523    Subjective  getting worse, legs not moving well and swollen    Currently in Pain?  Yes    Pain Score  5     Pain Location  Hip    Pain Orientation  Right;Left                       [facility] Adult PT Treatment/Exercise - 03/01/19 0001      Exercises   Exercises  Lumbar;Knee/Hip      Lumbar Exercises: Aerobic   Nustep  L 1 5 min      Lumbar Exercises:  Seated   Other Seated Lumbar Exercises  dyna disc pelvic ROM    15 times 4 ways     Knee/Hip Exercises: Seated   Long Arc Quad  Strengthening;Both;3 sets;5 sets;Weights    Long Arc Quad Weight  2 lbs.    Ball Squeeze  20    Clamshell with [employer]  Strengthening;Both;3 sets;5 reps;Weights    [employer]  2 lbs.    Hamstring Curl  Strengthening;Both;2 sets;10 reps   red tband     Manual Therapy   Manual Therapy  Soft tissue mobilization    Soft tissue mobilization  right buttock       Trigger Point Dry Needling - 03/01/19 0001    Consent Given?  Yes    Muscles Treated Lower Quadrant  Adductor longus/brevis/magnus    Muscles Treated Back/Hip  Piriformis;Gluteus maximus    Adductor Response  Palpable increased muscle length             PT Short Term Goals - 02/22/19 1533  PT SHORT TERM GOAL #1   Title  Patient to be independent with appopriate and progressive HEP, to be updated PRN     Time  2    Period  Weeks    Status  New        PT Long Term Goals - 02/22/19 1533      PT LONG TERM GOAL #1   Title  Patient to demonstrate right hip as being no more than 30% limited in flexibility all planes in order to allow her to more easily perform functional tasks such as putting shoes and socks on    Time  8    Period  Weeks    Status  New      PT LONG TERM GOAL #2   Title  Patient to demonstrate improved gait mechanics including improved hip rotation and mobility during gait, equal step/stance times, and upright posture in order to improve quality of motion and reduce pain     Time  8    Period  Weeks    Status  New      PT LONG TERM GOAL #3   Title  Patient to demonstrate correct functional biomechanics for tasks such as bed mobility, loading/unloading drier, and getting up into high car in order to prevent exacerbation of pain     Time  8    Period  Weeks    Status  New      PT LONG TERM GOAL #4   Title  Patient to be able to stand and  ambulate without increase in pain for at least 60 minutes in order to improve QOL and assist in return to prior level of activity     Time  8    Period  Weeks    Status  New            Plan - 03/01/19 1559    Clinical Impression Statement  pt tolerated initial ther ex fair. pain and popping per pt report, RT leg weaker than left. amb in clinic with BIL cane very slow,fwd flexed and wide BOS    PT Treatment/Interventions  ADLs/Self Care Home Management;Cryotherapy;Electrical Stimulation;Iontophoresis 4mg /ml Dexamethasone;Moist Heat;Ultrasound;Therapeutic activities;Functional mobility training;Gait training;Therapeutic exercise;Balance training;Neuromuscular re-education;Patient/family education;Dry needling    PT Next Visit Plan  slowly start exercises, DN and modalities ( multiple melanoma)       Patient will benefit from skilled therapeutic intervention in order to improve the following deficits and impairments:  Abnormal gait, Cardiopulmonary status limiting activity, Decreased activity tolerance, Decreased endurance, Decreased range of motion, Decreased strength, Difficulty walking, Increased edema, Decreased balance, Decreased mobility, Impaired flexibility, Obesity, Increased muscle spasms, Pain, Improper body mechanics  Visit Diagnosis: 1. Pain in right hip   2. Pain in left hip   3. Muscle weakness (generalized)   4. Difficulty in walking, not elsewhere classified   5. Stiffness of left hip, not elsewhere classified        Problem List Patient Active Problem List   Diagnosis Date Noted  . Polymyalgia rheumatica ([place]) 10/09/2018  . BMI 40.0-44.9, adult ([place]) 10/09/2018  . Foraminal stenosis of lumbar region 05/11/2018  . 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 On prednisone therapy 02/14/2018  . Herpes zoster 10/16/2017  . Chronic pain of both shoulders 08/10/2017  . Prediabetes 05/26/2017  . Primary osteoarthritis of both hips 03/15/2017   . Trochanteric bursitis of left hip 03/15/2017  . Recurrent epistaxis 12/09/2016  . Dysuria 11/03/2016  . Insomnia  11/03/2016  . Glaucoma 05/05/2016  . LPRD (laryngopharyngeal reflux disease) 01/26/2016  . Erosive esophagitis 11/27/2015  . Schatzki's ring of distal esophagus 11/27/2015  . Chronic sinusitis 10/16/2015  . Deviated nasal septum 10/16/2015  . Nasal polyp 10/16/2015  . Osteoporosis 08/15/2015  . Dysphagia, pharyngoesophageal phase 04/01/2015  . Endometrial polyp 10/24/2014  . Fatigue 07/09/2014  . Hyperlipidemia   . Breast cancer, right ([place]) 02/01/2014  . Malignant melanoma of arm ([place]) 11/23/2013  . Nasal septum ulceration 10/16/2013  . Seborrheic keratosis 10/16/2013  . Vitamin D deficiency 02/08/2013  . Sciatica of right side 01/24/2013  . Esophageal ulcer 01/24/2013  . Esophageal stenosis 01/24/2013  . Choroidal nevus of right eye 10/27/2012  . Impacted cerumen of right ear 10/11/2012  . Frequency 01/19/2012  . Gastroesophageal reflux disease 09/01/2011  . 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 H/O cataract extraction 04/30/2011  . Borderline glaucoma steroid responder 04/30/2011  . Obesity 10/16/2008  . Breast cancer of lower-outer quadrant of right female breast ([place]) 08/29/2008    Sumner Boast., PT 03/01/2019, 5:37 PM  [place], [place], [address] Phone: [phone]   Fax:  [phone]  Name: [name] MRN: [id number] Date of Birth: [birth year]

## 2019-03-05 ENCOUNTER — Encounter: Payer: Self-pay | Admitting: Physical Therapy

## 2019-03-05 ENCOUNTER — Ambulatory Visit: Payer: Medicare Other | Admitting: Physical Therapy

## 2019-03-05 DIAGNOSIS — R262 Difficulty in walking, not elsewhere classified: Secondary | ICD-10-CM

## 2019-03-05 DIAGNOSIS — M6281 Muscle weakness (generalized): Secondary | ICD-10-CM

## 2019-03-05 DIAGNOSIS — M25551 Pain in right hip: Secondary | ICD-10-CM | POA: Diagnosis not present

## 2019-03-05 DIAGNOSIS — M25652 Stiffness of left hip, not elsewhere classified: Secondary | ICD-10-CM

## 2019-03-05 DIAGNOSIS — M25552 Pain in left hip: Secondary | ICD-10-CM | POA: Diagnosis not present

## 2019-03-05 NOTE — Therapy (Signed)
[facility] [address] [place], [place], [address] Phone: [phone]   Fax:  [phone]  Physical Therapy Treatment  Patient Details  Name: [name] MRN: [id number] Date of Birth: 1945-09-07 Referring Provider (PT): Hollace Kinnier   Encounter Date: 03/05/2019  PT End of Session - 03/05/19 1427    Visit Number  3    Date for PT Re-Evaluation  04/25/19    PT Start Time  0102    PT Stop Time  7253    PT Time Calculation (min)  49 min    Activity Tolerance  Patient limited by pain;Patient limited by fatigue    Behavior During Therapy  [facility] for tasks assessed/performed       Past Medical History:  Diagnosis Date  . Asymptomatic varicose veins   . Cancer (HCC)    breast   . Diverticulosis   . DM type 2 (diabetes mellitus, type 2) ([place]) 10/23/2014  . Dysuria   . GERD (gastroesophageal reflux disease)   . Hiatal hernia   . Hyperlipidemia   . Malignant neoplasm of breast (female), unspecified site   . Migraine without aura, without mention of intractable migraine without mention of status migrainosus   . Other abnormal blood chemistry   . Pain in joint, pelvic region and thigh   . Shingles    in eyes  . Stricture and stenosis of esophagus   . Trigger finger (acquired)   . Unspecified cataract   . Unspecified glaucoma(365.9)     Past Surgical History:  Procedure Laterality Date  . APPENDECTOMY  1973   Dr. Linzie Collin  . BREAST LUMPECTOMY Right 08/29/2008   Dr. Erroll Luna  . ESOPHAGEAL DILATION    . NASAL POLYP EXCISION  10/2015  . POLYPECTOMY  11/20/14   Uterine Polyp Removed    There were no vitals filed for this visit.  Subjective Assessment - 03/05/19 1348    Subjective  "a lot of pain since last time"    Patient Stated Goals  have less pain and walk better    Currently in Pain?  Yes    Pain Score  5     Pain Location  Hip    Pain Orientation  Left;Right                        [facility] Adult PT Treatment/Exercise - 03/05/19 0001      Lumbar Exercises: Aerobic   Nustep  L 3 6 min      Knee/Hip Exercises: Seated   Long Arc Quad  2 sets;10 reps;Both    Long Arc Quad Weight  2 lbs.    Ball Squeeze  20    Clamshell with TheraBand  Red    Marching  Both;2 sets;5 reps;10 reps    Marching Weights  2 lbs.    Hamstring Curl  Both;2 sets;15 reps    Hamstring Limitations  red  tband               PT Short Term Goals - 02/22/19 1533      PT SHORT TERM GOAL #1   Title  Patient to be independent with appopriate and progressive HEP, to be updated PRN     Time  2    Period  Weeks    Status  New        PT Long Term Goals - 02/22/19 1533      PT LONG TERM  GOAL #1   Title  Patient to demonstrate right hip as being no more than 30% limited in flexibility all planes in order to allow her to more easily perform functional tasks such as putting shoes and socks on    Time  8    Period  Weeks    Status  New      PT LONG TERM GOAL #2   Title  Patient to demonstrate improved gait mechanics including improved hip rotation and mobility during gait, equal step/stance times, and upright posture in order to improve quality of motion and reduce pain     Time  8    Period  Weeks    Status  New      PT LONG TERM GOAL #3   Title  Patient to demonstrate correct functional biomechanics for tasks such as bed mobility, loading/unloading drier, and getting up into high car in order to prevent exacerbation of pain     Time  8    Period  Weeks    Status  New      PT LONG TERM GOAL #4   Title  Patient to be able to stand and ambulate without increase in pain for at least 60 minutes in order to improve QOL and assist in return to prior level of activity     Time  8    Period  Weeks    Status  New            Plan - 03/05/19 1427    Clinical Impression Statement  Pt reports some improvement from today with her movement. She was able  to do increase reps with seated marches and LAQ. Pain reports throughout session in both hips but more so in the R. Amb with bilat SPC forward flexed posture, slow gait and wide BOS.    Comorbidities  GERD, DM, Glaucoma, obestiy    Stability/Clinical Decision Making  Evolving/Moderate complexity    Rehab Potential  Good    PT Frequency  2x / week    PT Treatment/Interventions  ADLs/Self Care Home Management;Cryotherapy;Electrical Stimulation;Iontophoresis 4mg /ml Dexamethasone;Moist Heat;Ultrasound;Therapeutic activities;Functional mobility training;Gait training;Therapeutic exercise;Balance training;Neuromuscular re-education;Patient/family education;Dry needling    PT Next Visit Plan  slowly start exercises, DN and modalities ( multiple melanoma)       Patient will benefit from skilled therapeutic intervention in order to improve the following deficits and impairments:  Abnormal gait, Cardiopulmonary status limiting activity, Decreased activity tolerance, Decreased endurance, Decreased range of motion, Decreased strength, Difficulty walking, Increased edema, Decreased balance, Decreased mobility, Impaired flexibility, Obesity, Increased muscle spasms, Pain, Improper body mechanics  Visit Diagnosis: 1. Muscle weakness (generalized)   2. Difficulty in walking, not elsewhere classified   3. Stiffness of left hip, not elsewhere classified   4. Pain in right hip   5. Pain in left hip        Problem List Patient Active Problem List   Diagnosis Date Noted  . Polymyalgia rheumatica ([place]) 10/09/2018  . BMI 40.0-44.9, adult ([place]) 10/09/2018  . Foraminal stenosis of lumbar region 05/11/2018  . 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 On prednisone therapy 02/14/2018  . Herpes zoster 10/16/2017  . Chronic pain of both shoulders 08/10/2017  . Prediabetes 05/26/2017  . Primary osteoarthritis of both hips 03/15/2017  . Trochanteric bursitis of left hip 03/15/2017  .  Recurrent epistaxis 12/09/2016  . Dysuria 11/03/2016  . Insomnia 11/03/2016  . Glaucoma 05/05/2016  . LPRD (laryngopharyngeal reflux disease) 01/26/2016  .  Erosive esophagitis 11/27/2015  . Schatzki's ring of distal esophagus 11/27/2015  . Chronic sinusitis 10/16/2015  . Deviated nasal septum 10/16/2015  . Nasal polyp 10/16/2015  . Osteoporosis 08/15/2015  . Dysphagia, pharyngoesophageal phase 04/01/2015  . Endometrial polyp 10/24/2014  . Fatigue 07/09/2014  . Hyperlipidemia   . Breast cancer, right ([place]) 02/01/2014  . Malignant melanoma of arm ([place]) 11/23/2013  . Nasal septum ulceration 10/16/2013  . Seborrheic keratosis 10/16/2013  . Vitamin D deficiency 02/08/2013  . Sciatica of right side 01/24/2013  . Esophageal ulcer 01/24/2013  . Esophageal stenosis 01/24/2013  . Choroidal nevus of right eye 10/27/2012  . Impacted cerumen of right ear 10/11/2012  . Frequency 01/19/2012  . Gastroesophageal reflux disease 09/01/2011  . 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 H/O cataract extraction 04/30/2011  . Borderline glaucoma steroid responder 04/30/2011  . Obesity 10/16/2008  . Breast cancer of lower-outer quadrant of right female breast ([place]) 08/29/2008    Scot Jun, PTA 03/05/2019, 2:30 PM  [place], [place], [address] Phone: [phone]   Fax:  [phone]  Name: [name] MRN: [id number] Date of Birth: [birth year]

## 2019-03-06 ENCOUNTER — Ambulatory Visit: Payer: Medicare Other | Admitting: Podiatry

## 2019-03-08 ENCOUNTER — Other Ambulatory Visit: Payer: Self-pay | Admitting: Internal Medicine

## 2019-03-08 DIAGNOSIS — R600 Localized edema: Secondary | ICD-10-CM

## 2019-03-10 DIAGNOSIS — Z853 Personal history of malignant neoplasm of breast: Secondary | ICD-10-CM | POA: Diagnosis not present

## 2019-03-10 DIAGNOSIS — M25559 Pain in unspecified hip: Secondary | ICD-10-CM | POA: Diagnosis not present

## 2019-03-10 DIAGNOSIS — R51 Headache: Secondary | ICD-10-CM | POA: Diagnosis not present

## 2019-03-10 DIAGNOSIS — K219 Gastro-esophageal reflux disease without esophagitis: Secondary | ICD-10-CM | POA: Diagnosis not present

## 2019-03-10 DIAGNOSIS — M7989 Other specified soft tissue disorders: Secondary | ICD-10-CM | POA: Diagnosis not present

## 2019-03-10 DIAGNOSIS — M1990 Unspecified osteoarthritis, unspecified site: Secondary | ICD-10-CM | POA: Diagnosis not present

## 2019-03-10 DIAGNOSIS — R7303 Prediabetes: Secondary | ICD-10-CM | POA: Diagnosis not present

## 2019-03-10 DIAGNOSIS — M353 Polymyalgia rheumatica: Secondary | ICD-10-CM | POA: Diagnosis not present

## 2019-03-10 DIAGNOSIS — I8390 Asymptomatic varicose veins of unspecified lower extremity: Secondary | ICD-10-CM | POA: Diagnosis not present

## 2019-03-10 DIAGNOSIS — R600 Localized edema: Secondary | ICD-10-CM | POA: Diagnosis not present

## 2019-03-10 DIAGNOSIS — C4361 Malignant melanoma of right upper limb, including shoulder: Secondary | ICD-10-CM | POA: Diagnosis not present

## 2019-03-13 ENCOUNTER — Encounter: Payer: Medicare Other | Admitting: Physical Therapy

## 2019-03-15 ENCOUNTER — Ambulatory Visit: Payer: Medicare Other | Admitting: Internal Medicine

## 2019-03-16 DIAGNOSIS — Z87898 Personal history of other specified conditions: Secondary | ICD-10-CM | POA: Diagnosis not present

## 2019-03-16 DIAGNOSIS — M353 Polymyalgia rheumatica: Secondary | ICD-10-CM | POA: Diagnosis not present

## 2019-03-16 DIAGNOSIS — M1990 Unspecified osteoarthritis, unspecified site: Secondary | ICD-10-CM | POA: Diagnosis not present

## 2019-03-20 ENCOUNTER — Other Ambulatory Visit: Payer: Self-pay

## 2019-03-20 ENCOUNTER — Encounter: Payer: Self-pay | Admitting: Physical Therapy

## 2019-03-20 ENCOUNTER — Ambulatory Visit: Payer: Medicare Other | Admitting: Physical Therapy

## 2019-03-20 ENCOUNTER — Ambulatory Visit: Payer: Medicare Other | Admitting: Podiatry

## 2019-03-20 DIAGNOSIS — M6281 Muscle weakness (generalized): Secondary | ICD-10-CM

## 2019-03-20 DIAGNOSIS — M25552 Pain in left hip: Secondary | ICD-10-CM

## 2019-03-20 DIAGNOSIS — M25551 Pain in right hip: Secondary | ICD-10-CM | POA: Diagnosis not present

## 2019-03-20 DIAGNOSIS — R262 Difficulty in walking, not elsewhere classified: Secondary | ICD-10-CM | POA: Diagnosis not present

## 2019-03-20 DIAGNOSIS — M25652 Stiffness of left hip, not elsewhere classified: Secondary | ICD-10-CM | POA: Diagnosis not present

## 2019-03-20 NOTE — Therapy (Signed)
[place] [address] [place], [place], [address] Phone: [phone]   Fax:  [phone]  Physical Therapy Treatment  Patient Details  Name: [name] MRN: [id number] Date of Birth: June 16, 1946 Referring Provider (PT): Hollace Kinnier   Encounter Date: 03/20/2019  PT End of Session - 03/20/19 1520    Visit Number  4    Date for PT Re-Evaluation  04/25/19    PT Start Time  1430    PT Stop Time  1525    PT Time Calculation (min)  55 min    Activity Tolerance  Patient limited by pain;Patient limited by fatigue    Behavior During Therapy  [facility] for tasks assessed/performed       Past Medical History:  Diagnosis Date  . Asymptomatic varicose veins   . Cancer (HCC)    breast   . Diverticulosis   . DM type 2 (diabetes mellitus, type 2) ([place]) 10/23/2014  . Dysuria   . GERD (gastroesophageal reflux disease)   . Hiatal hernia   . Hyperlipidemia   . Malignant neoplasm of breast (female), unspecified site   . Migraine without aura, without mention of intractable migraine without mention of status migrainosus   . Other abnormal blood chemistry   . Pain in joint, pelvic region and thigh   . Shingles    in eyes  . Stricture and stenosis of esophagus   . Trigger finger (acquired)   . Unspecified cataract   . Unspecified glaucoma(365.9)     Past Surgical History:  Procedure Laterality Date  . APPENDECTOMY  1973   Dr. Linzie Collin  . BREAST LUMPECTOMY Right 08/29/2008   Dr. Erroll Luna  . ESOPHAGEAL DILATION    . NASAL POLYP EXCISION  10/2015  . POLYPECTOMY  11/20/14   Uterine Polyp Removed    There were no vitals filed for this visit.  Subjective Assessment - 03/20/19 1440    Subjective  "Not any better" Pt reporting increase pain. Both LE are swollen    Patient Stated Goals  have less pain and walk better    Currently in Pain?  Yes    Pain Score  7     Pain Location  Leg    Pain Orientation  Left;Right                        [facility] Adult PT Treatment/Exercise - 03/20/19 0001      Lumbar Exercises: Aerobic   Nustep  L 3 7 min      Knee/Hip Exercises: Seated   Long Arc Quad  2 sets;10 reps;Both    Ball Squeeze  2x10     Other Seated Knee/Hip Exercises  red band 4 way ankle x10 each bilat.     Marching  Both;2 sets;5 reps;10 reps    Hamstring Curl  Both;2 sets;10 reps;Strengthening    Hamstring Limitations  yellow Tband                PT Short Term Goals - 02/22/19 1533      PT SHORT TERM GOAL #1   Title  Patient to be independent with appopriate and progressive HEP, to be updated PRN     Time  2    Period  Weeks    Status  New        PT Long Term Goals - 02/22/19 1533      PT LONG TERM GOAL #1  Title  Patient to demonstrate right hip as being no more than 30% limited in flexibility all planes in order to allow her to more easily perform functional tasks such as putting shoes and socks on    Time  8    Period  Weeks    Status  New      PT LONG TERM GOAL #2   Title  Patient to demonstrate improved gait mechanics including improved hip rotation and mobility during gait, equal step/stance times, and upright posture in order to improve quality of motion and reduce pain     Time  8    Period  Weeks    Status  New      PT LONG TERM GOAL #3   Title  Patient to demonstrate correct functional biomechanics for tasks such as bed mobility, loading/unloading drier, and getting up into high car in order to prevent exacerbation of pain     Time  8    Period  Weeks    Status  New      PT LONG TERM GOAL #4   Title  Patient to be able to stand and ambulate without increase in pain for at least 60 minutes in order to improve QOL and assist in return to prior level of activity     Time  8    Period  Weeks    Status  New            Plan - 03/20/19 1520    Clinical Impression Statement  Pt reports increase difficulty with her movement. Increase pain and  swelling in both LE's. She reports she could not walk for four day after last treatment but did not know why because the exercises were the same as previous. All exercises today's performed with little to no resistance. Pt does need assist coming in and out of clinic.    Personal Factors and Comorbidities  Comorbidity 3+    Comorbidities  GERD, DM, Glaucoma, obestiy    Stability/Clinical Decision Making  Evolving/Moderate complexity    Rehab Potential  Good    PT Frequency  2x / week    PT Duration  8 weeks    PT Treatment/Interventions  ADLs/Self Care Home Management;Cryotherapy;Electrical Stimulation;Iontophoresis 4mg /ml Dexamethasone;Moist Heat;Ultrasound;Therapeutic activities;Functional mobility training;Gait training;Therapeutic exercise;Balance training;Neuromuscular re-education;Patient/family education;Dry needling    PT Next Visit Plan  slowly exercises, DN and modalities ( multiple melanoma)       Patient will benefit from skilled therapeutic intervention in order to improve the following deficits and impairments:  Abnormal gait, Cardiopulmonary status limiting activity, Decreased activity tolerance, Decreased endurance, Decreased range of motion, Decreased strength, Difficulty walking, Increased edema, Decreased balance, Decreased mobility, Impaired flexibility, Obesity, Increased muscle spasms, Pain, Improper body mechanics  Visit Diagnosis: Pain in left hip  Pain in right hip  Stiffness of left hip, not elsewhere classified  Difficulty in walking, not elsewhere classified  Muscle weakness (generalized)     Problem List Patient Active Problem List   Diagnosis Date Noted  . Polymyalgia rheumatica ([place]) 10/09/2018  . BMI 40.0-44.9, adult ([place]) 10/09/2018  . Foraminal stenosis of lumbar region 05/11/2018  . 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 On prednisone therapy 02/14/2018  . Herpes zoster 10/16/2017  . Chronic pain of both shoulders  08/10/2017  . Prediabetes 05/26/2017  . Primary osteoarthritis of both hips 03/15/2017  . Trochanteric bursitis of left hip 03/15/2017  . Recurrent epistaxis 12/09/2016  . Dysuria 11/03/2016  .  Insomnia 11/03/2016  . Glaucoma 05/05/2016  . LPRD (laryngopharyngeal reflux disease) 01/26/2016  . Erosive esophagitis 11/27/2015  . Schatzki's ring of distal esophagus 11/27/2015  . Chronic sinusitis 10/16/2015  . Deviated nasal septum 10/16/2015  . Nasal polyp 10/16/2015  . Osteoporosis 08/15/2015  . Dysphagia, pharyngoesophageal phase 04/01/2015  . Endometrial polyp 10/24/2014  . Fatigue 07/09/2014  . Hyperlipidemia   . Breast cancer, right ([place]) 02/01/2014  . Malignant melanoma of arm ([place]) 11/23/2013  . Nasal septum ulceration 10/16/2013  . Seborrheic keratosis 10/16/2013  . Vitamin D deficiency 02/08/2013  . Sciatica of right side 01/24/2013  . Esophageal ulcer 01/24/2013  . Esophageal stenosis 01/24/2013  . Choroidal nevus of right eye 10/27/2012  . Impacted cerumen of right ear 10/11/2012  . Frequency 01/19/2012  . Gastroesophageal reflux disease 09/01/2011  . 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 H/O cataract extraction 04/30/2011  . Borderline glaucoma steroid responder 04/30/2011  . Obesity 10/16/2008  . Breast cancer of lower-outer quadrant of right female breast ([place]) 08/29/2008    Scot Jun 03/20/2019, 3:29 PM  [place], [place], [address] Phone: [phone]   Fax:  [phone]  Name: ASMI KURTH MRN: [id number] Date of Birth: [birth year]

## 2019-03-22 ENCOUNTER — Ambulatory Visit: Payer: Medicare Other | Admitting: Physical Therapy

## 2019-03-28 ENCOUNTER — Other Ambulatory Visit: Payer: Self-pay

## 2019-03-28 ENCOUNTER — Other Ambulatory Visit: Payer: Self-pay | Admitting: *Deleted

## 2019-03-28 ENCOUNTER — Ambulatory Visit (INDEPENDENT_AMBULATORY_CARE_PROVIDER_SITE_OTHER): Payer: Medicare Other | Admitting: Podiatry

## 2019-03-28 ENCOUNTER — Encounter: Payer: Self-pay | Admitting: Podiatry

## 2019-03-28 DIAGNOSIS — M79675 Pain in left toe(s): Secondary | ICD-10-CM

## 2019-03-28 DIAGNOSIS — B351 Tinea unguium: Secondary | ICD-10-CM

## 2019-03-28 DIAGNOSIS — M79674 Pain in right toe(s): Secondary | ICD-10-CM

## 2019-03-28 DIAGNOSIS — E1142 Type 2 diabetes mellitus with diabetic polyneuropathy: Secondary | ICD-10-CM

## 2019-03-28 DIAGNOSIS — M5431 Sciatica, right side: Secondary | ICD-10-CM

## 2019-03-28 MED ORDER — HYDROCODONE-ACETAMINOPHEN 10-325 MG PO TABS: 1.0000 | ORAL_TABLET | Freq: Four times a day (QID) | ORAL | 0 refills | Status: DC | PRN

## 2019-03-28 NOTE — Telephone Encounter (Signed)
Patient requested refill [place] Database Verified LR: 03/01/2019 Pended Rx and sent to Dr. Reed for approval.  

## 2019-03-28 NOTE — Progress Notes (Signed)
Complaint:  Visit Type: Patient returns to my office for continued preventative foot care services. Complaint: Patient states" my third toenails both feet  have grown long and thick and become painful to walk and wear shoes"  The patient presents for preventative foot care services. No changes to ROS  Podiatric Exam: Vascular: dorsalis pedis and posterior tibial pulses are palpable bilateral. Capillary return is immediate. Temperature gradient is WNL. Skin turgor WNL  Sensorium: Normal Semmes Weinstein monofilament test. Normal tactile sensation bilaterally. Nail Exam: Pt has thick disfigured discolored nails with subungual debris noted third toenails both feet. Ulcer Exam: There is no evidence of ulcer or pre-ulcerative changes or infection. Orthopedic Exam: Muscle tone and strength are WNL. No limitations in general ROM. No crepitus or effusions noted. Foot type and digits show no abnormalities. Bony prominences are unremarkable. Skin: No Porokeratosis. No infection or ulcers  Diagnosis:  Onychomycosis, , Pain in right toe, pain in left toes  Treatment & Plan Procedures and Treatment: Consent by patient was obtained for treatment procedures.   Debridement of mycotic and hypertrophic toenails, 1 through 5 bilateral and clearing of subungual debris. No ulceration, no infection noted.  Patient is having foot problems and she is under the care of her medical doctors. Return Visit-Office Procedure: Patient instructed to return to the office for a follow up visit 3 months for continued evaluation and treatment.    Travis Mastel DPM 

## 2019-03-29 ENCOUNTER — Encounter

## 2019-04-04 DIAGNOSIS — M810 Age-related osteoporosis without current pathological fracture: Secondary | ICD-10-CM | POA: Diagnosis not present

## 2019-04-05 ENCOUNTER — Ambulatory Visit: Payer: Medicare Other | Attending: Internal Medicine | Admitting: Physical Therapy

## 2019-04-05 ENCOUNTER — Other Ambulatory Visit: Payer: Self-pay

## 2019-04-05 ENCOUNTER — Encounter: Payer: Self-pay | Admitting: Physical Therapy

## 2019-04-05 DIAGNOSIS — R262 Difficulty in walking, not elsewhere classified: Secondary | ICD-10-CM | POA: Insufficient documentation

## 2019-04-05 DIAGNOSIS — M25551 Pain in right hip: Secondary | ICD-10-CM | POA: Diagnosis not present

## 2019-04-05 DIAGNOSIS — M25652 Stiffness of left hip, not elsewhere classified: Secondary | ICD-10-CM | POA: Diagnosis not present

## 2019-04-05 DIAGNOSIS — M25552 Pain in left hip: Secondary | ICD-10-CM | POA: Diagnosis not present

## 2019-04-05 NOTE — Therapy (Signed)
[place] [address] [place], [place], [address] Phone: [phone]   Fax:  [phone]  Physical Therapy Treatment  Patient Details  Name: [name] MRN: [id number] Date of Birth: October 08, 1945 Referring Provider (PT): Hollace Kinnier   Encounter Date: 04/05/2019  PT End of Session - 04/05/19 1513    Visit Number  5    Date for PT Re-Evaluation  04/25/19    PT Start Time  [id number]    PT Stop Time  1525    PT Time Calculation (min)  48 min    Activity Tolerance  Patient limited by pain;Patient limited by fatigue    Behavior During Therapy  [facility] for tasks assessed/performed       Past Medical History:  Diagnosis Date  . Asymptomatic varicose veins   . Cancer (HCC)    breast   . Diverticulosis   . DM type 2 (diabetes mellitus, type 2) ([place]) 10/23/2014  . Dysuria   . GERD (gastroesophageal reflux disease)   . Hiatal hernia   . Hyperlipidemia   . Malignant neoplasm of breast (female), unspecified site   . Migraine without aura, without mention of intractable migraine without mention of status migrainosus   . Other abnormal blood chemistry   . Pain in joint, pelvic region and thigh   . Shingles    in eyes  . Stricture and stenosis of esophagus   . Trigger finger (acquired)   . Unspecified cataract   . Unspecified glaucoma(365.9)     Past Surgical History:  Procedure Laterality Date  . APPENDECTOMY  1973   Dr. Linzie Collin  . BREAST LUMPECTOMY Right 08/29/2008   Dr. Erroll Luna  . ESOPHAGEAL DILATION    . NASAL POLYP EXCISION  10/2015  . POLYPECTOMY  11/20/14   Uterine Polyp Removed    There were no vitals filed for this visit.  Subjective Assessment - 04/05/19 1445    Subjective  Pt reports no change overall, was unable to get her MRI  because she could not lay flat.    Currently in Pain?  Yes    Pain Score  8     Pain Location  --   knee to hip   Pain Orientation  Right                        [facility] Adult PT Treatment/Exercise - 04/05/19 0001      Lumbar Exercises: Aerobic   Nustep  L 3 8 min      Knee/Hip Exercises: Seated   Long Arc Quad  2 sets;10 reps;Both    Ball Squeeze  2x10     Marching  Both;1 set;5 reps    Hamstring Curl  Both;2 sets;Strengthening;15 reps    Hamstring Limitations  yellow Tband                PT Short Term Goals - 02/22/19 1533      PT SHORT TERM GOAL #1   Title  Patient to be independent with appopriate and progressive HEP, to be updated PRN     Time  2    Period  Weeks    Status  New        PT Long Term Goals - 02/22/19 1533      PT LONG TERM GOAL #1   Title  Patient to demonstrate right hip as being no more than 30% limited in flexibility all planes in order  to allow her to more easily perform functional tasks such as putting shoes and socks on    Time  8    Period  Weeks    Status  New      PT LONG TERM GOAL #2   Title  Patient to demonstrate improved gait mechanics including improved hip rotation and mobility during gait, equal step/stance times, and upright posture in order to improve quality of motion and reduce pain     Time  8    Period  Weeks    Status  New      PT LONG TERM GOAL #3   Title  Patient to demonstrate correct functional biomechanics for tasks such as bed mobility, loading/unloading drier, and getting up into high car in order to prevent exacerbation of pain     Time  8    Period  Weeks    Status  New      PT LONG TERM GOAL #4   Title  Patient to be able to stand and ambulate without increase in pain for at least 60 minutes in order to improve QOL and assist in return to prior level of activity     Time  8    Period  Weeks    Status  New            Plan - 04/05/19 1511    Clinical Impression Statement  No progress towards goals. PT report increase R hip pain that goes down to her knee. She reports some L hip pain as well but not as bad. She was able to  complete all seated interventions but pain throughout. R hip pain increases after 5 marches, despite the little ROM achieved. Pt has difficulty with mobility requiring wheelchair to get around clinic. I    Personal Factors and Comorbidities  Comorbidity 3+    Comorbidities  GERD, DM, Glaucoma, obestiy    Stability/Clinical Decision Making  Evolving/Moderate complexity    Rehab Potential  Good    PT Frequency  2x / week    PT Duration  8 weeks    PT Treatment/Interventions  ADLs/Self Care Home Management;Cryotherapy;Electrical Stimulation;Iontophoresis 4mg /ml Dexamethasone;Moist Heat;Ultrasound;Therapeutic activities;Functional mobility training;Gait training;Therapeutic exercise;Balance training;Neuromuscular re-education;Patient/family education;Dry needling       Patient will benefit from skilled therapeutic intervention in order to improve the following deficits and impairments:  Abnormal gait, Cardiopulmonary status limiting activity, Decreased activity tolerance, Decreased endurance, Decreased range of motion, Decreased strength, Difficulty walking, Increased edema, Decreased balance, Decreased mobility, Impaired flexibility, Obesity, Increased muscle spasms, Pain, Improper body mechanics  Visit Diagnosis: Pain in right hip  Pain in left hip  Stiffness of left hip, not elsewhere classified  Difficulty in walking, not elsewhere classified     Problem List Patient Active Problem List   Diagnosis Date Noted  . Polymyalgia rheumatica ([place]) 10/09/2018  . BMI 40.0-44.9, adult ([place]) 10/09/2018  . Foraminal stenosis of lumbar region 05/11/2018  . 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 On prednisone therapy 02/14/2018  . Herpes zoster 10/16/2017  . Chronic pain of both shoulders 08/10/2017  . Prediabetes 05/26/2017  . Primary osteoarthritis of both hips 03/15/2017  . Trochanteric bursitis of left hip 03/15/2017  . Recurrent epistaxis 12/09/2016  . Dysuria  11/03/2016  . Insomnia 11/03/2016  . Glaucoma 05/05/2016  . LPRD (laryngopharyngeal reflux disease) 01/26/2016  . Erosive esophagitis 11/27/2015  . Schatzki's ring of distal esophagus 11/27/2015  . Chronic sinusitis 10/16/2015  . Deviated nasal  septum 10/16/2015  . Nasal polyp 10/16/2015  . Osteoporosis 08/15/2015  . Dysphagia, pharyngoesophageal phase 04/01/2015  . Endometrial polyp 10/24/2014  . Fatigue 07/09/2014  . Hyperlipidemia   . Breast cancer, right ([place]) 02/01/2014  . Malignant melanoma of arm ([place]) 11/23/2013  . Nasal septum ulceration 10/16/2013  . Seborrheic keratosis 10/16/2013  . Vitamin D deficiency 02/08/2013  . Sciatica of right side 01/24/2013  . Esophageal ulcer 01/24/2013  . Esophageal stenosis 01/24/2013  . Choroidal nevus of right eye 10/27/2012  . Impacted cerumen of right ear 10/11/2012  . Frequency 01/19/2012  . Gastroesophageal reflux disease 09/01/2011  . 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 H/O cataract extraction 04/30/2011  . Borderline glaucoma steroid responder 04/30/2011  . Obesity 10/16/2008  . Breast cancer of lower-outer quadrant of right female breast ([place]) 08/29/2008    Scot Jun, PTA 04/05/2019, 3:18 PM  [place], [place], [address] Phone: [phone]   Fax:  [phone]  Name: [name] MRN: [id number] Date of Birth: [birth year]

## 2019-04-09 ENCOUNTER — Other Ambulatory Visit: Payer: Self-pay

## 2019-04-09 ENCOUNTER — Encounter: Payer: Medicare Other | Admitting: Physical Therapy

## 2019-04-09 ENCOUNTER — Ambulatory Visit (INDEPENDENT_AMBULATORY_CARE_PROVIDER_SITE_OTHER): Payer: Medicare Other | Admitting: Family

## 2019-04-09 ENCOUNTER — Encounter: Payer: Self-pay | Admitting: Family

## 2019-04-09 DIAGNOSIS — M79661 Pain in right lower leg: Secondary | ICD-10-CM | POA: Diagnosis not present

## 2019-04-09 DIAGNOSIS — M25551 Pain in right hip: Secondary | ICD-10-CM

## 2019-04-09 DIAGNOSIS — M7989 Other specified soft tissue disorders: Secondary | ICD-10-CM

## 2019-04-09 NOTE — Progress Notes (Signed)
Patient ID: [name], female   DOB: [birth year], 73 y.o.   MRN: [id number] This service is provided via telemedicine  No vital signs collected/recorded due to the encounter was a telemedicine visit.   Location of patient (ex: home, work):  HOME  Patient consents to a telephone visit:  YES  Location of the provider (ex: office, home):  OFFICE  Name of any referring provider:  TIFFANY REED, DO  Names of all persons participating in the telemedicine service and their role in the encounter:  PATIENT, [name], [place], Marlowe Sax, NP  Time spent on call:  5:03   Provider: Dinah Ngetich FNP-C  Gayland Curry, DO  Patient Care Team: Gayland Curry, DO as PCP - General (Geriatric Medicine) Magrinat, Virgie Dad, MD as Consulting Physician (Oncology) Erroll Luna, MD as Consulting Physician (General Surgery) Harvie Heck, MD as Physician Assistant (Internal Medicine) Chyrel Masson, DO as Anesthesiologist (Otolaryngology) Clinger, Chrystie Nose, MD (Otolaryngology) Turner Daniels, MD as Referring Physician (Internal Medicine)  Extended Emergency Contact Information Primary Emergency Contact: Segundo,Terry Address: [address], [place] Phone: [phone] Work Phone: [phone] Mobile Phone: [phone] Relation: Son  Code Status: Full Code  Goals of care: Advanced Directive information Advanced Directives 02/22/2019  Does Patient Have a Medical Advance Directive? No  Would patient like information on creating a medical advance directive? No - Patient declined     Chief Complaint  Patient presents with  . Acute Visit    asking for toilet seat    HPI:  Pt is a 73 y.o. female seen today for an acute visit for evaluation of right hip/leg pain and swelling.she states having a lot of difficulties trying to sit and getting up from the toilet.she request an order for an over the toilet seat commode. She has  chronic back and right high pain.she has on going numbness and tingling and weakness on right leg described as " dead weight".she walks with a walker.she states recently had an MRI due to stage 4 melanoma.she also has upcoming MRI for left foot possible cancer spread on PET scan.she denies any fever,chills or cough.     Past Medical History:  Diagnosis Date  . Asymptomatic varicose veins   . Cancer (HCC)    breast   . Diverticulosis   . DM type 2 (diabetes mellitus, type 2) ([place]) 10/23/2014  . Dysuria   . GERD (gastroesophageal reflux disease)   . Hiatal hernia   . Hyperlipidemia   . Malignant neoplasm of breast (female), unspecified site   . Migraine without aura, without mention of intractable migraine without mention of status migrainosus   . Other abnormal blood chemistry   . Pain in joint, pelvic region and thigh   . Shingles    in eyes  . Stricture and stenosis of esophagus   . Trigger finger (acquired)   . Unspecified cataract   . Unspecified glaucoma(365.9)    Past Surgical History:  Procedure Laterality Date  . APPENDECTOMY  1973   Dr. Linzie Collin  . BREAST LUMPECTOMY Right 08/29/2008   Dr. Erroll Luna  . ESOPHAGEAL DILATION    . NASAL POLYP EXCISION  10/2015  . POLYPECTOMY  11/20/14   Uterine Polyp Removed    Allergies  Allergen Reactions  . Adhesive [Tape] Dermatitis  . Betimol [Timolol Maleate]     Allergic to preservatives   . Cosopt [Dorzolamide Hcl-Timolol Mal]  Other (See Comments)    Turns eye red, increases eye pressure  . Penicillins   . Prednisolone Acetate Other (See Comments)    Caused increased eye pressure  . Tamoxifen Other (See Comments)    Vaginal bleeding  . Thimerosal     Makes pt eye red     Outpatient Encounter Medications as of 04/09/2019  Medication Sig  . Ascorbic Acid (VITAMIN C) 1000 MG tablet Take 1,000 mg by mouth daily.  . Calcium Citrate-Vitamin D 250-200 MG-UNIT TABS Take 250 mg by mouth daily.   . Cholecalciferol (VITAMIN  D-3) 5000 units TABS Take by mouth daily.  . Cyanocobalamin (B-12) 5000 MCG CAPS Take by mouth daily.  . DULoxetine (CYMBALTA) 20 MG capsule Take 1 capsule (20 mg total) by mouth daily.  . fluorometholone (FML) 0.1 % ophthalmic suspension Place 1 drop into the right eye daily.  . furosemide (LASIX) 20 MG tablet TAKE 1 TABLET (20 MG TOTAL) BY MOUTH DAILY AS NEEDED FOR EDEMA.  Marland Kitchen HYDROcodone-acetaminophen (NORCO) 10-325 MG tablet Take 1 tablet by mouth every 6 (six) hours as needed for severe pain.  Marland Kitchen LORazepam (ATIVAN) 1 MG tablet TAKE 1/2 TABLET BY MOUTH AT BEDTIME FOR SLEEP  . Magnesium 250 MG TABS Take 2 tablets by mouth daily.  . Multiple Vitamin (MULTIVITAMIN) capsule Take 1 capsule by mouth daily.  Marland Kitchen omeprazole (PRILOSEC) 40 MG capsule Take 40 mg by mouth.  . predniSONE (DELTASONE) 1 MG tablet Take 1 mg by mouth as directed.  . predniSONE (DELTASONE) 1 MG tablet Take 2 mg by mouth daily with breakfast. Take along with the 10mg  and 5mg   . predniSONE (DELTASONE) 10 MG tablet Take 10 mg by mouth daily. With the 5mg   . predniSONE (DELTASONE) 5 MG tablet Take 5 mg by mouth daily with breakfast. Take along with the 10mg  tablet  . timolol (TIMOPTIC) 0.5 % ophthalmic solution PLACE 1 DROP INTO THE RIGHT EYE 2 TIMES DAILY.  . valACYclovir (VALTREX) 1000 MG tablet Take 1,000 mg by mouth daily.    No facility-administered encounter medications on file as of 04/09/2019.     Review of Systems  Constitutional: Negative for chills, fatigue and fever.  HENT: Negative for congestion, rhinorrhea, sinus pressure, sinus pain, sneezing and sore throat.   Eyes: Negative for discharge, redness and itching.  Respiratory: Negative for cough, chest tightness, shortness of breath and wheezing.   Cardiovascular: Positive for leg swelling. Negative for chest pain and palpitations.  Gastrointestinal: Negative for abdominal distention, abdominal pain, constipation, diarrhea, nausea and vomiting.  Genitourinary:  Negative for difficulty urinating, dysuria and flank pain.  Musculoskeletal: Positive for arthralgias, back pain and gait problem.  Skin: Negative for color change, pallor and rash.  Neurological: Positive for weakness and numbness.       Numbness and tingling of the right foot. Left foot deep red colored.     Immunization History  Administered Date(s) Administered  . Influenza Whole 07/26/2010  . Influenza,inj,Quad PF,6+ Mos 04/24/2014, 04/01/2015, 05/05/2016  . Influenza-Unspecified 06/30/2010, 04/25/2013, 05/31/2018  . Pneumococcal Conjugate-13 10/23/2014  . Pneumococcal Polysaccharide-23 10/29/2016  . Td 06/24/2009  . Tdap 07/26/2008  . Zoster 10/17/2013   Pertinent  Health Maintenance Due  Topic Date Due  . INFLUENZA VACCINE  02/24/2019  . COLONOSCOPY  03/26/2019  . URINE MICROALBUMIN  10/09/2019  . MAMMOGRAM  04/14/2020  . DEXA SCAN  Completed  . PNA vac Low Risk Adult  Completed   Fall Risk  02/12/2019 01/24/2019 01/16/2019 10/09/2018 06/05/2018  Falls in the past year? 0 0 0 0 0  Number falls in past yr: 0 0 0 0 0  Injury with Fall? 0 0 0 0 0   There were no vitals filed for this visit. There is no height or weight on file to calculate BMI. Physical Exam Unable to complete on a telephone visit.   Labs reviewed: Recent Labs    10/09/18 1405  NA 138  K 4.5  CL 99  CO2 29  GLUCOSE 119  BUN 17  CREATININE 0.62  CALCIUM 10.3   No results for input(s): AST, ALT, ALKPHOS, BILITOT, PROT, ALBUMIN in the last 8760 hours. No results for input(s): WBC, NEUTROABS, HGB, HCT, MCV, PLT in the last 8760 hours. No results found for: TSH Lab Results  Component Value Date   HGBA1C 6.0 (H) 10/09/2018   Lab Results  Component Value Date   CHOL 236 (H) 10/29/2016   HDL 60 10/29/2016   LDLCALC 146 (H) 10/29/2016   TRIG 149 10/29/2016   CHOLHDL 3.9 10/29/2016    Significant Diagnostic Results in last 30 days:  No results found.  Assessment/Plan 1. Pain of right hip  joint Ongoing chronic pain follows up with Oncologist for stage 4 melanoma.Reports difficulties using and getting off the toilet.Over the Toilet seat commode chair ordered.   2. Pain and swelling of right lower leg Continue on Norco 10-325 mg tablet one by mouth every 6 hours.continue to follow up with Oncologist.    Family/ staff Communication: Reviewed plan of care with patient.  Labs/tests ordered: None   Spent 13  minutes of non-face to face with patient    Sandrea Hughs, NP

## 2019-04-11 ENCOUNTER — Ambulatory Visit: Payer: Medicare Other | Admitting: Physical Therapy

## 2019-04-11 ENCOUNTER — Encounter: Payer: Self-pay | Admitting: Physical Therapy

## 2019-04-11 DIAGNOSIS — R262 Difficulty in walking, not elsewhere classified: Secondary | ICD-10-CM | POA: Diagnosis not present

## 2019-04-11 DIAGNOSIS — M25652 Stiffness of left hip, not elsewhere classified: Secondary | ICD-10-CM | POA: Diagnosis not present

## 2019-04-11 DIAGNOSIS — M25551 Pain in right hip: Secondary | ICD-10-CM | POA: Diagnosis not present

## 2019-04-11 DIAGNOSIS — M25552 Pain in left hip: Secondary | ICD-10-CM | POA: Diagnosis not present

## 2019-04-11 NOTE — Therapy (Signed)
[address] [place], [place], [address] Phone: [phone]   Fax:  [phone]  Physical Therapy Treatment  Patient Details  Name: [name] MRN: [id number] Date of Birth: August 26, 1945 Referring Provider (PT): Hollace Kinnier   Encounter Date: 04/11/2019  PT End of Session - 04/11/19 1641    Visit Number  6    Date for PT Re-Evaluation  04/25/19    PT Start Time  1600    PT Stop Time  [id number]    PT Time Calculation (min)  54 min    Activity Tolerance  Patient limited by pain;Patient limited by fatigue    Behavior During Therapy  [facility] for tasks assessed/performed       Past Medical History:  Diagnosis Date  . Asymptomatic varicose veins   . Cancer (HCC)    breast   . Diverticulosis   . DM type 2 (diabetes mellitus, type 2) ([place]) 10/23/2014  . Dysuria   . GERD (gastroesophageal reflux disease)   . Hiatal hernia   . Hyperlipidemia   . Malignant neoplasm of breast (female), unspecified site   . Migraine without aura, without mention of intractable migraine without mention of status migrainosus   . Other abnormal blood chemistry   . Pain in joint, pelvic region and thigh   . Shingles    in eyes  . Stricture and stenosis of esophagus   . Trigger finger (acquired)   . Unspecified cataract   . Unspecified glaucoma(365.9)     Past Surgical History:  Procedure Laterality Date  . APPENDECTOMY  1973   Dr. Linzie Collin  . BREAST LUMPECTOMY Right 08/29/2008   Dr. Erroll Luna  . ESOPHAGEAL DILATION    . NASAL POLYP EXCISION  10/2015  . POLYPECTOMY  11/20/14   Uterine Polyp Removed    There were no vitals filed for this visit.  Subjective Assessment - 04/11/19 1606    Subjective  "not good" R leg really bothering her, especially upper thigh, hip, LB. L leg bothers her a bit as well. DN affective    Pain Score  7     Pain Location  Hip    Pain Orientation  Right                        [facility] Adult PT Treatment/Exercise - 04/11/19 0001      Lumbar Exercises: Aerobic   Nustep  L 3 8 min      Knee/Hip Exercises: Seated   Long Arc Quad  10 reps;Both;1 set    Ball Squeeze  2x10     Other Seated Knee/Hip Exercises  red band 4 way ankle x10 each bilat.     Marching  Both;1 set;10 reps;Strengthening    Hamstring Curl  Both;2 sets;Strengthening;15 reps   cues for slow contractions   Hamstring Limitations  yellow Tband                PT Short Term Goals - 02/22/19 1533      PT SHORT TERM GOAL #1   Title  Patient to be independent with appopriate and progressive HEP, to be updated PRN     Time  2    Period  Weeks    Status  New        PT Long Term Goals - 02/22/19 1533      PT LONG TERM GOAL #1   Title  Patient to demonstrate right  hip as being no more than 30% limited in flexibility all planes in order to allow her to more easily perform functional tasks such as putting shoes and socks on    Time  8    Period  Weeks    Status  New      PT LONG TERM GOAL #2   Title  Patient to demonstrate improved gait mechanics including improved hip rotation and mobility during gait, equal step/stance times, and upright posture in order to improve quality of motion and reduce pain     Time  8    Period  Weeks    Status  New      PT LONG TERM GOAL #3   Title  Patient to demonstrate correct functional biomechanics for tasks such as bed mobility, loading/unloading drier, and getting up into high car in order to prevent exacerbation of pain     Time  8    Period  Weeks    Status  New      PT LONG TERM GOAL #4   Title  Patient to be able to stand and ambulate without increase in pain for at least 60 minutes in order to improve QOL and assist in return to prior level of activity     Time  8    Period  [place] - 04/11/19 1647    Clinical Impression Statement  no progress towards goal. pt guarding  heavily unable to stand upright when ambulating requiring mod assist +1 for tranfers. able to complete all interventions however pain still present. R hip was able to perform 20 marches without increases in pain. pt required WC to and from clinic. DN performed today by PT due to effectiveness of relieving pain.   PT Treatment/Interventions  ADLs/Self Care Home Management;Cryotherapy;Electrical Stimulation;Iontophoresis 4mg /ml Dexamethasone;Moist Heat;Ultrasound;Therapeutic activities;Functional mobility training;Gait training;Therapeutic exercise;Balance training;Neuromuscular re-education;Patient/family education;Dry needling    PT Next Visit Plan  slowly exercises, DN and modalities ( multiple melanoma)       Patient will benefit from skilled therapeutic intervention in order to improve the following deficits and impairments:  Abnormal gait, Cardiopulmonary status limiting activity, Decreased activity tolerance, Decreased endurance, Decreased range of motion, Decreased strength, Difficulty walking, Increased edema, Decreased balance, Decreased mobility, Impaired flexibility, Obesity, Increased muscle spasms, Pain, Improper body mechanics  Visit Diagnosis: Pain in right hip  Pain in left hip  Stiffness of left hip, not elsewhere classified     Problem List Patient Active Problem List   Diagnosis Date Noted  . Polymyalgia rheumatica ([place]) 10/09/2018  . BMI 40.0-44.9, adult ([place]) 10/09/2018  . Foraminal stenosis of lumbar region 05/11/2018  . 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 On prednisone therapy 02/14/2018  . Herpes zoster 10/16/2017  . Chronic pain of both shoulders 08/10/2017  . Prediabetes 05/26/2017  . Primary osteoarthritis of both hips 03/15/2017  . Trochanteric bursitis of left hip 03/15/2017  . Recurrent epistaxis 12/09/2016  . Dysuria 11/03/2016  . Insomnia 11/03/2016  . Glaucoma 05/05/2016  . LPRD (laryngopharyngeal reflux disease)  01/26/2016  . Erosive esophagitis 11/27/2015  . Schatzki's ring of distal esophagus 11/27/2015  . Chronic sinusitis 10/16/2015  . Deviated nasal septum 10/16/2015  . Nasal polyp 10/16/2015  . Osteoporosis 08/15/2015  . Dysphagia, pharyngoesophageal phase 04/01/2015  . Endometrial polyp 10/24/2014  . Fatigue 07/09/2014  . Hyperlipidemia   . Breast cancer, right (  [place]) 02/01/2014  . Malignant melanoma of arm ([place]) 11/23/2013  . Nasal septum ulceration 10/16/2013  . Seborrheic keratosis 10/16/2013  . Vitamin D deficiency 02/08/2013  . Sciatica of right side 01/24/2013  . Esophageal ulcer 01/24/2013  . Esophageal stenosis 01/24/2013  . Choroidal nevus of right eye 10/27/2012  . Impacted cerumen of right ear 10/11/2012  . Frequency 01/19/2012  . Gastroesophageal reflux disease 09/01/2011  . 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 H/O cataract extraction 04/30/2011  . Borderline glaucoma steroid responder 04/30/2011  . Obesity 10/16/2008  . Breast cancer of lower-outer quadrant of right female breast [facility]) 08/29/2008    Dylan Lyvia Mondesir, SPTA 04/11/2019, 4:50 PM  [place] [address] [place], [place], [address] Phone: [phone]   Fax:  [phone]  Name: [name] MRN: [id number] Date of Birth: 08-18-1945

## 2019-04-16 DIAGNOSIS — Z20828 Contact with and (suspected) exposure to other viral communicable diseases: Secondary | ICD-10-CM | POA: Diagnosis not present

## 2019-04-16 DIAGNOSIS — Z01812 Encounter for preprocedural laboratory examination: Secondary | ICD-10-CM | POA: Diagnosis not present

## 2019-04-16 DIAGNOSIS — R9389 Abnormal findings on diagnostic imaging of other specified body structures: Secondary | ICD-10-CM | POA: Diagnosis not present

## 2019-04-23 DIAGNOSIS — R9389 Abnormal findings on diagnostic imaging of other specified body structures: Secondary | ICD-10-CM | POA: Diagnosis not present

## 2019-04-23 DIAGNOSIS — M810 Age-related osteoporosis without current pathological fracture: Secondary | ICD-10-CM | POA: Diagnosis not present

## 2019-04-24 ENCOUNTER — Other Ambulatory Visit: Payer: Self-pay | Admitting: *Deleted

## 2019-04-24 DIAGNOSIS — M5431 Sciatica, right side: Secondary | ICD-10-CM

## 2019-04-24 MED ORDER — HYDROCODONE-ACETAMINOPHEN 10-325 MG PO TABS: 1.0000 | ORAL_TABLET | Freq: Four times a day (QID) | ORAL | 0 refills | Status: DC | PRN

## 2019-04-24 NOTE — Telephone Encounter (Signed)
Patient requested refill [place] Database Verified LR: 03/28/19 Pended Rx and sent to [place] [facility] for approval Dr. Mariea Clonts out of office.

## 2019-04-26 ENCOUNTER — Ambulatory Visit: Payer: Medicare Other | Admitting: Physical Therapy

## 2019-05-02 ENCOUNTER — Ambulatory Visit: Payer: Medicare Other | Attending: Internal Medicine | Admitting: Physical Therapy

## 2019-05-02 ENCOUNTER — Other Ambulatory Visit: Payer: Self-pay

## 2019-05-02 ENCOUNTER — Encounter: Payer: Self-pay | Admitting: Physical Therapy

## 2019-05-02 DIAGNOSIS — M25551 Pain in right hip: Secondary | ICD-10-CM | POA: Insufficient documentation

## 2019-05-02 DIAGNOSIS — M25652 Stiffness of left hip, not elsewhere classified: Secondary | ICD-10-CM | POA: Insufficient documentation

## 2019-05-02 DIAGNOSIS — R262 Difficulty in walking, not elsewhere classified: Secondary | ICD-10-CM | POA: Insufficient documentation

## 2019-05-02 DIAGNOSIS — M6281 Muscle weakness (generalized): Secondary | ICD-10-CM | POA: Insufficient documentation

## 2019-05-02 DIAGNOSIS — M25552 Pain in left hip: Secondary | ICD-10-CM | POA: Insufficient documentation

## 2019-05-02 NOTE — Therapy (Signed)
[address] [place], [place], [address] Phone: [phone]   Fax:  [phone]  Physical Therapy Treatment  Patient Details  Name: [name] MRN: [id number] Date of Birth: [birth year] Referring Provider (PT): Hollace Kinnier   Encounter Date: 05/02/2019  PT End of Session - 05/02/19 1647    Visit Number  7    PT Start Time  1600    PT Stop Time  0354    PT Time Calculation (min)  45 min    Activity Tolerance  Patient limited by pain;Patient limited by fatigue    Behavior During Therapy  [facility] for tasks assessed/performed       Past Medical History:  Diagnosis Date  . Asymptomatic varicose veins   . Cancer (HCC)    breast   . Diverticulosis   . DM type 2 (diabetes mellitus, type 2) ([place]) 10/23/2014  . Dysuria   . GERD (gastroesophageal reflux disease)   . Hiatal hernia   . Hyperlipidemia   . Malignant neoplasm of breast (female), unspecified site   . Migraine without aura, without mention of intractable migraine without mention of status migrainosus   . Other abnormal blood chemistry   . Pain in joint, pelvic region and thigh   . Shingles    in eyes  . Stricture and stenosis of esophagus   . Trigger finger (acquired)   . Unspecified cataract   . Unspecified glaucoma(365.9)     Past Surgical History:  Procedure Laterality Date  . APPENDECTOMY  1973   Dr. Linzie Collin  . BREAST LUMPECTOMY Right 08/29/2008   Dr. Erroll Luna  . ESOPHAGEAL DILATION    . NASAL POLYP EXCISION  10/2015  . POLYPECTOMY  11/20/14   Uterine Polyp Removed    There were no vitals filed for this visit.  Subjective Assessment - 05/02/19 1610    Subjective  "It is actually a good day"    Currently in Pain?  Yes    Pain Score  5     Pain Location  Hip    Pain Orientation  Left;Right                       [facility] Adult PT Treatment/Exercise - 05/02/19 0001      Lumbar Exercises: Aerobic   Nustep  L 3 8  min      Knee/Hip Exercises: Standing   Other Standing Knee Exercises  Standing march in RW 2x10 each      Knee/Hip Exercises: Seated   Long Arc Quad  AROM;Both;2 sets;15 reps    Ball Squeeze  2x10     Marching  Both;10 reps;2 sets;AROM    Hamstring Curl  Strengthening;Both;2 sets;15 reps    Hamstring Limitations  green Tband     Abduction/Adduction   Strengthening;Both;2 sets;10 reps   manual resistance               PT Short Term Goals - 02/22/19 1533      PT SHORT TERM GOAL #1   Title  Patient to be independent with appopriate and progressive HEP, to be updated PRN     Time  2    Period  Weeks    Status  New        PT Long Term Goals - 05/02/19 1614      PT LONG TERM GOAL #1   Title  Patient to demonstrate right hip as being no more  than 30% limited in flexibility all planes in order to allow her to more easily perform functional tasks such as putting shoes and socks on    Status  On-going      PT LONG TERM GOAL #2   Title  Patient to demonstrate improved gait mechanics including improved hip rotation and mobility during gait, equal step/stance times, and upright posture in order to improve quality of motion and reduce pain     Status  On-going      PT LONG TERM GOAL #3   Title  Patient to demonstrate correct functional biomechanics for tasks such as bed mobility, loading/unloading drier, and getting up into high car in order to prevent exacerbation of pain     Status  Partially Met      PT LONG TERM GOAL #4   Title  Patient to be able to stand and ambulate without increase in pain for at least 60 minutes in order to improve QOL and assist in return to prior level of activity     Status  On-going            Plan - 05/02/19 1648    Clinical Impression Statement  Pt still requires WC to get in and out of clinic. Pt did better in PT today compared to past visits. She was able to progress to some standing activities. Greater ROM achieved with standing L hip  flexion compared to the R. No reports of increase with the interventions.    Personal Factors and Comorbidities  Comorbidity 3+    Comorbidities  GERD, DM, Glaucoma, obestiy    Stability/Clinical Decision Making  Evolving/Moderate complexity    Rehab Potential  Good    PT Frequency  2x / week    PT Duration  8 weeks    PT Treatment/Interventions  ADLs/Self Care Home Management;Cryotherapy;Electrical Stimulation;Iontophoresis '4mg'$ /ml Dexamethasone;Moist Heat;Ultrasound;Therapeutic activities;Functional mobility training;Gait training;Therapeutic exercise;Balance training;Neuromuscular re-education;Patient/family education;Dry needling    PT Next Visit Plan  slowly exercises, DN and modalities ( multiple melanoma)       Patient will benefit from skilled therapeutic intervention in order to improve the following deficits and impairments:  Abnormal gait, Cardiopulmonary status limiting activity, Decreased activity tolerance, Decreased endurance, Decreased range of motion, Decreased strength, Difficulty walking, Increased edema, Decreased balance, Decreased mobility, Impaired flexibility, Obesity, Increased muscle spasms, Pain, Improper body mechanics  Visit Diagnosis: Pain in right hip  Pain in left hip  Stiffness of left hip, not elsewhere classified  Difficulty in walking, not elsewhere classified  Muscle weakness (generalized)     Problem List Patient Active Problem List   Diagnosis Date Noted  . Polymyalgia rheumatica ([place]) 10/09/2018  . BMI 40.0-44.9, adult ([place]) 10/09/2018  . Foraminal stenosis of lumbar region 05/11/2018  . 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 On prednisone therapy 02/14/2018  . Herpes zoster 10/16/2017  . Chronic pain of both shoulders 08/10/2017  . Prediabetes 05/26/2017  . Primary osteoarthritis of both hips 03/15/2017  . Trochanteric bursitis of left hip 03/15/2017  . Recurrent epistaxis 12/09/2016  . Dysuria 11/03/2016  .  Insomnia 11/03/2016  . Glaucoma 05/05/2016  . LPRD (laryngopharyngeal reflux disease) 01/26/2016  . Erosive esophagitis 11/27/2015  . Schatzki's ring of distal esophagus 11/27/2015  . Chronic sinusitis 10/16/2015  . Deviated nasal septum 10/16/2015  . Nasal polyp 10/16/2015  . Osteoporosis 08/15/2015  . Dysphagia, pharyngoesophageal phase 04/01/2015  . Endometrial polyp 10/24/2014  . Fatigue 07/09/2014  . Hyperlipidemia   .  Breast cancer, right ([place]) 02/01/2014  . Malignant melanoma of arm ([place]) 11/23/2013  . Nasal septum ulceration 10/16/2013  . Seborrheic keratosis 10/16/2013  . Vitamin D deficiency 02/08/2013  . Sciatica of right side 01/24/2013  . Esophageal ulcer 01/24/2013  . Esophageal stenosis 01/24/2013  . Choroidal nevus of right eye 10/27/2012  . Impacted cerumen of right ear 10/11/2012  . Frequency 01/19/2012  . Gastroesophageal reflux disease 09/01/2011  . 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 H/O cataract extraction 04/30/2011  . Borderline glaucoma steroid responder 04/30/2011  . Obesity 10/16/2008  . Breast cancer of lower-outer quadrant of right female breast ([place]) 08/29/2008    Scot Jun, PTA 05/02/2019, 4:56 PM  [address] [place], [place], [address] Phone: [phone]   Fax:  [phone]  Name: [name] MRN: [id number] Date of Birth: 1946-04-06

## 2019-05-03 ENCOUNTER — Ambulatory Visit: Payer: Medicare Other | Admitting: Internal Medicine

## 2019-05-03 DIAGNOSIS — C4361 Malignant melanoma of right upper limb, including shoulder: Secondary | ICD-10-CM | POA: Diagnosis not present

## 2019-05-03 DIAGNOSIS — M353 Polymyalgia rheumatica: Secondary | ICD-10-CM | POA: Diagnosis not present

## 2019-05-03 DIAGNOSIS — M169 Osteoarthritis of hip, unspecified: Secondary | ICD-10-CM | POA: Diagnosis not present

## 2019-05-03 DIAGNOSIS — G8929 Other chronic pain: Secondary | ICD-10-CM | POA: Diagnosis not present

## 2019-05-03 DIAGNOSIS — R600 Localized edema: Secondary | ICD-10-CM | POA: Diagnosis not present

## 2019-05-03 DIAGNOSIS — Z23 Encounter for immunization: Secondary | ICD-10-CM | POA: Diagnosis not present

## 2019-05-03 DIAGNOSIS — M25559 Pain in unspecified hip: Secondary | ICD-10-CM | POA: Diagnosis not present

## 2019-05-03 DIAGNOSIS — M1990 Unspecified osteoarthritis, unspecified site: Secondary | ICD-10-CM | POA: Diagnosis not present

## 2019-05-09 ENCOUNTER — Ambulatory Visit: Payer: Medicare Other | Admitting: Physical Therapy

## 2019-05-15 ENCOUNTER — Ambulatory Visit: Payer: Medicare Other | Admitting: Physical Therapy

## 2019-05-17 ENCOUNTER — Other Ambulatory Visit: Payer: Self-pay

## 2019-05-17 DIAGNOSIS — M5431 Sciatica, right side: Secondary | ICD-10-CM

## 2019-05-17 MED ORDER — HYDROCODONE-ACETAMINOPHEN 10-325 MG PO TABS: 1.0000 | ORAL_TABLET | Freq: Four times a day (QID) | ORAL | 0 refills | Status: DC | PRN

## 2019-05-17 NOTE — Telephone Encounter (Signed)
Last filled in epic on 04/24/2019, number 90  Per patient she was suppose to get 120 tablets at last refill yet it was sent in for 90 in error.

## 2019-05-22 ENCOUNTER — Ambulatory Visit: Payer: Medicare Other | Admitting: Physical Therapy

## 2019-05-27 ENCOUNTER — Other Ambulatory Visit: Payer: Self-pay | Admitting: Internal Medicine

## 2019-05-27 DIAGNOSIS — F5104 Psychophysiologic insomnia: Secondary | ICD-10-CM

## 2019-05-28 ENCOUNTER — Other Ambulatory Visit: Payer: Self-pay | Admitting: Internal Medicine

## 2019-05-28 ENCOUNTER — Ambulatory Visit (INDEPENDENT_AMBULATORY_CARE_PROVIDER_SITE_OTHER): Payer: Medicare Other | Admitting: Internal Medicine

## 2019-05-28 ENCOUNTER — Encounter: Payer: Self-pay | Admitting: Internal Medicine

## 2019-05-28 ENCOUNTER — Other Ambulatory Visit: Payer: Self-pay

## 2019-05-28 VITALS — Ht 64.0 in | Wt 235.0 lb

## 2019-05-28 DIAGNOSIS — M353 Polymyalgia rheumatica: Secondary | ICD-10-CM

## 2019-05-28 DIAGNOSIS — E6609 Other obesity due to excess calories: Secondary | ICD-10-CM

## 2019-05-28 DIAGNOSIS — R1031 Right lower quadrant pain: Secondary | ICD-10-CM | POA: Diagnosis not present

## 2019-05-28 DIAGNOSIS — Z6839 Body mass index (BMI) 39.0-39.9, adult: Secondary | ICD-10-CM | POA: Diagnosis not present

## 2019-05-28 DIAGNOSIS — M48062 Spinal stenosis, lumbar region with neurogenic claudication: Secondary | ICD-10-CM | POA: Diagnosis not present

## 2019-05-28 DIAGNOSIS — R7303 Prediabetes: Secondary | ICD-10-CM | POA: Diagnosis not present

## 2019-05-28 MED ORDER — LORAZEPAM 1 MG PO TABS: 1.0000 mg | ORAL_TABLET | Freq: Every day | ORAL | 0 refills | Status: DC

## 2019-05-28 NOTE — Progress Notes (Signed)
This service is provided via telemedicine  No vital signs collected/recorded due to the encounter was a telemedicine visit.   Location of patient (ex: home, work):  Home  Patient consents to a telephone visit:  YES  Location of the provider (ex: office, home):  [place]  Name of any referring provider:  Hollace Kinnier, DO  Names of all persons participating in the telemedicine service and their role in the encounter:  Leafy Half CMA., Dr. Mariea Clonts and patient.   Time spent on call:  11 mins    Location:  [facility] clinic  Provider: Ercia Crisafulli L. Mariea Clonts, D.O., C.M.D.  Goals of Care:  Advanced Directives 02/22/2019  Does Patient Have a Medical Advance Directive? No  Would patient like information on creating a medical advance directive? No - Patient declined   Chief Complaint  Patient presents with  . Medical Management of Chronic Issues    4 week follow up on right groin pain. Patient wants to disccuss seeing a neurologist. She states the pain has gotten worse and unable to walk without her walker or canes. She will be seeing ortho  about possible hip surgery on Dec. 1st. She would like her lorazepam increased back to 1 daily from 1/2 b/c she sometimes takes the second half and then is running out. She recently had a MRI and was told to work on losing weight.     HPI: Patient is a 73 y.o. female with h/o melanoma, anxiety, insomnia, now chronic pain the past 2+ years in her hips spoken with by phone today for medical management of chronic diseases.    She says she is continually going downhill and nothing is fixing anything,    She'd had the PET scan that lit something up in her foot.  She had an MRI and she could not sit still for it.  When she had the MRI eventually under sedation, it turned out a blood vessel might be enlarged.  Oncology said not cancer.  Right entire leg and foot are swollen: xrays we have showed only mild arthritis.  She saw Dr. Mylo Red who is  a "bone doctor cancer doctor".  She had said she was not a candidate for a hip replacement.  She was told to lose weight and she lost 15 lbs by cutting back.  There were concerns about healing from surgery due to her prior immune therapy treatments for her melanoma in the past.  She was no longer to f/u with them b/c it was not cancer in the hip.  Then advised to see ortho instead.  She has an appt with ortho up there in [facility] 12/1 (referred by a friend of hers???)--looks like she saw another PCP in [place], [place] who works for [employer].    Lorazepam--says she needs to go back to '1mg'$ .  She's not taking cymbalta anymore.  sed rate was still up with rheum so prednisone was increased to '17mg'$  and then started weaning--she's actually taking '15mg'$  now. She has been doing this. She's not sure it's doing anything for her anymore.  She asks about MS at her age--her muscles are sore everywhere.  Says it's a generalized weird achy pain from her waist to her knees.  She has to use a walker.  She got a ramp built.  Uses two canes to get into the car.  She thinks it's MS b/c she has muscle and nerve pain.  She read that people who had mono are prone to MS  with age.  A few weeks ago, she started to have a problem with incontinence--she'd have a sudden urge, and she would begin leaking.  That's gotten worse.  Ankles and feet are swollen.  She has tingling that happens every once in a while.  She has difficulty concentrating.  She is still working and has to review things over and over.  She says she's dropping stuff.  Says all of this started after 6/18 when she had a virus.  She occasionally gets jerking in her legs suddenly.  MRI she previously had did not reveal MS lesions.    She's not taking lasix anymore.  We had tried cymbalta for her and she could not stay awake with it in the daytime.  She tried moving it to hs, then she could not sleep.  It made her feel awful.  She then quit taking it.   She also did not tolerate gabapentin due to sedation.    Past Medical History:  Diagnosis Date  . Asymptomatic varicose veins   . Cancer (HCC)    breast   . Diverticulosis   . DM type 2 (diabetes mellitus, type 2) ([place]) 10/23/2014  . Dysuria   . GERD (gastroesophageal reflux disease)   . Hiatal hernia   . Hyperlipidemia   . Malignant neoplasm of breast (female), unspecified site   . Migraine without aura, without mention of intractable migraine without mention of status migrainosus   . Other abnormal blood chemistry   . Pain in joint, pelvic region and thigh   . Shingles    in eyes  . Stricture and stenosis of esophagus   . Trigger finger (acquired)   . Unspecified cataract   . Unspecified glaucoma(365.9)     Past Surgical History:  Procedure Laterality Date  . APPENDECTOMY  1973   Dr. Linzie Collin  . BREAST LUMPECTOMY Right 08/29/2008   Dr. Erroll Luna  . ESOPHAGEAL DILATION    . NASAL POLYP EXCISION  10/2015  . POLYPECTOMY  11/20/14   Uterine Polyp Removed    Allergies  Allergen Reactions  . Cymbalta [Duloxetine Hcl] Other (See Comments)  . Gabapentin Other (See Comments)  . Adhesive [Tape] Dermatitis  . Betimol [Timolol Maleate]     Allergic to preservatives   . Cosopt [Dorzolamide Hcl-Timolol Mal] Other (See Comments)    Turns eye red, increases eye pressure  . Penicillins   . Prednisolone Acetate Other (See Comments)    Caused increased eye pressure  . Tamoxifen Other (See Comments)    Vaginal bleeding  . Thimerosal     Makes pt eye red     Allergies as of 05/28/2019      Reactions   Cymbalta [duloxetine Hcl] Other (See Comments)   Gabapentin Other (See Comments)   Adhesive [tape] Dermatitis   Betimol [timolol Maleate]    Allergic to preservatives    Cosopt [dorzolamide Hcl-timolol Mal] Other (See Comments)   Turns eye red, increases eye pressure   Penicillins    Prednisolone Acetate Other (See Comments)   Caused increased eye pressure   Tamoxifen  Other (See Comments)   Vaginal bleeding   Thimerosal    Makes pt eye red      Medication List       Accurate as of May 28, 2019 12:33 PM. If you have any questions, ask your nurse or doctor.        STOP taking these medications   DULoxetine 20 MG capsule Commonly known as: Cymbalta Stopped by:  Hatsuko Bizzarro, DO   furosemide 20 MG tablet Commonly known as: LASIX Stopped by: Hollace Kinnier, DO     TAKE these medications   B-12 5000 MCG Caps Take by mouth daily.   Calcium Citrate-Vitamin D 250-200 MG-UNIT Tabs Take 250 mg by mouth daily.   fluorometholone 0.1 % ophthalmic suspension Commonly known as: FML Place 1 drop into the right eye daily.   HYDROcodone-acetaminophen 10-325 MG tablet Commonly known as: NORCO Take 1 tablet by mouth every 6 (six) hours as needed for severe pain.   LORazepam 1 MG tablet Commonly known as: ATIVAN TAKE 1/2 TABLET BY MOUTH AT BEDTIME FOR SLEEP   Magnesium 250 MG Tabs Take 2 tablets by mouth daily.   multivitamin capsule Take 1 capsule by mouth daily.   omeprazole 40 MG capsule Commonly known as: PRILOSEC Take 40 mg by mouth.   predniSONE 5 MG tablet Commonly known as: DELTASONE Take 5 mg by mouth daily with breakfast. Take along with the '10mg'$  tablet   predniSONE 1 MG tablet Commonly known as: DELTASONE Take 1 mg by mouth as directed.   predniSONE 1 MG tablet Commonly known as: DELTASONE Take 2 mg by mouth daily with breakfast. Take along with the '10mg'$  and '5mg'$    predniSONE 10 MG tablet Commonly known as: DELTASONE Take 10 mg by mouth daily. With the '5mg'$    QC TUMERIC COMPLEX PO Take 500 mg by mouth. 2 daily   timolol 0.5 % ophthalmic solution Commonly known as: TIMOPTIC PLACE 1 DROP INTO THE RIGHT EYE 2 TIMES DAILY.   Valtrex 1000 MG tablet Generic drug: valACYclovir Take 1,000 mg by mouth daily.   vitamin C 1000 MG tablet Take 1,000 mg by mouth daily.   Vitamin D-3 125 MCG (5000 UT) Tabs Take by mouth  daily.       Review of Systems:  Review of Systems  Constitutional: Positive for activity change. Negative for appetite change, chills and fever.       Reports losing 15 lbs  HENT: Negative for congestion, hearing loss and trouble swallowing.   Eyes: Negative for visual disturbance.  Respiratory: Negative for chest tightness and shortness of breath.   Cardiovascular: Positive for leg swelling. Negative for chest pain and palpitations.  Gastrointestinal: Negative for abdominal pain, constipation, diarrhea, nausea and vomiting.  Genitourinary: Positive for urgency. Negative for dysuria.       New incontinence  Musculoskeletal: Positive for arthralgias, gait problem and myalgias. Negative for back pain, joint swelling, neck pain and neck stiffness.  Skin: Negative for color change.  Psychiatric/Behavioral: Positive for decreased concentration and sleep disturbance. Negative for agitation and confusion. The patient is nervous/anxious.     Health Maintenance  Topic Date Due  . Hepatitis C Screening  [birth year]  . COLONOSCOPY  03/26/2019  . TETANUS/TDAP  06/25/2019  . URINE MICROALBUMIN  10/09/2019  . MAMMOGRAM  04/14/2020  . INFLUENZA VACCINE  Completed  . DEXA SCAN  Completed  . PNA vac Low Risk Adult  Completed    Physical Exam: Vitals:   05/28/19 1025  Weight: 235 lb (106.6 kg)  Height: '5\' 4"'$  (1.626 m)   Body mass index is 40.34 kg/m. Pt not seen in office, had phone visit  Labs reviewed: Basic Metabolic Panel: Recent Labs    10/09/18 1405  NA 138  K 4.5  CL 99  CO2 29  GLUCOSE 119  BUN 17  CREATININE 0.62  CALCIUM 10.3   Liver Function Tests: No results for input(s): AST,  ALT, ALKPHOS, BILITOT, PROT, ALBUMIN in the last 8760 hours. No results for input(s): LIPASE, AMYLASE in the last 8760 hours. No results for input(s): AMMONIA in the last 8760 hours. CBC: No results for input(s): WBC, NEUTROABS, HGB, HCT, MCV, PLT in the last 8760 hours. Lipid Panel: No  results for input(s): CHOL, HDL, LDLCALC, TRIG, CHOLHDL, LDLDIRECT in the last 8760 hours. Lab Results  Component Value Date   HGBA1C 6.0 (H) 10/09/2018    Procedures since last visit: 04/23/19 MRI of left ankle w/ and w/o contrast at [facility]:  No abnormal marrow signal. No masses, fluid collections, or abnormal enhancement. No acute fracture or malalignment.    Small ganglion protruding from the posterior aspect of the distal tibiofibular joint. Small tibiotalar joint effusion. Plantar calcaneal and medial malleolar enthesophyte.   MRI lumbar spine 04/20/17 at [facility]:  Multilevel degenerative disc and facet disease involving the lumbar spine results in moderate canal and bilateral foraminal stenosis at L2-L3 and moderate canal stenosis L3-L4.  Assessment/Plan 1. Right groin pain -chronic, ongoing; hip xrays showed mild arthritis -she has an ortho appt; however, for consultation about possible hip replacement nonetheless  2. Class 2 obesity due to excess calories without serious comorbidity with body mass index (BMI) of 39.0 to 39.9 in adult -remains a concern, but she reports having lost 15 lbs - CBC with Differential/Platelet; Future - Lipid panel; Future  3. Polymyalgia rheumatica ([place]) - is now on '15mg'$  prednisone -sees rheum at [place] - last ESR was 38 at that visit in [place] area - C-reactive protein; Future - Sedimentation rate; Future  4. Prediabetes - has not had recent labs with me since appt got changed to phone visit--needs certainly before our next meeting - COMPLETE METABOLIC PANEL WITH GFR; Future - Hemoglobin A1c; Future  5. Neurogenic claudication due to lumbar spinal stenosis -suspect most of her symptoms are due to this -she's concerned she has MS or another neurologic disorder so she requests a neurology referral which will be entered--see her detailed concerns in hpi above in reference to this -may need new MRI   Labs/tests  ordered:  Lab Orders     CBC with Differential/Platelet     COMPLETE METABOLIC PANEL WITH GFR     Hemoglobin A1c     Lipid panel     C-reactive protein     Sedimentation rate  Next appt: 09/27/2019 med mgt, fasting labs before 32 mins spent on televisit and another half hour spent reviewing records and trying to figure out what she's had done at her numerous other specialists in other health systems  Shakai Dolley L. Vianne Grieshop, D.O. [facility] [address], [place] [address] Cell Phone (Mon-Fri 8am-5pm):  [phone] On Call:  [phone] & follow prompts after 5pm & weekends Office Phone:  [phone] Office Fax:  [phone]

## 2019-05-28 NOTE — Telephone Encounter (Signed)
It was not b/c it was already in the refill queue and the system would not let me do anything with it during the appt.  I will fill now.  Thanks.

## 2019-05-28 NOTE — Telephone Encounter (Signed)
Patient had a televisit today.  Unable to discern is a refill was done.   Last refill 05/01/19.  #15

## 2019-05-31 ENCOUNTER — Ambulatory Visit: Payer: Medicare Other | Admitting: Internal Medicine

## 2019-06-06 ENCOUNTER — Other Ambulatory Visit: Payer: Self-pay

## 2019-06-06 ENCOUNTER — Encounter: Payer: Self-pay | Admitting: Diagnostic Neuroimaging

## 2019-06-06 ENCOUNTER — Ambulatory Visit (INDEPENDENT_AMBULATORY_CARE_PROVIDER_SITE_OTHER): Payer: Medicare Other | Admitting: Diagnostic Neuroimaging

## 2019-06-06 VITALS — BP 149/79 | HR 80 | Temp 97.8°F | Ht 64.0 in | Wt 234.0 lb

## 2019-06-06 DIAGNOSIS — M48062 Spinal stenosis, lumbar region with neurogenic claudication: Secondary | ICD-10-CM

## 2019-06-06 NOTE — Progress Notes (Signed)
[facility]  PATIENT: [name] DOB: [birth year]  REFERRING CLINICIAN: Reed, T HISTORY FROM: patient and son REASON FOR VISIT: new consult    HISTORICAL  CHIEF COMPLAINT:  Chief Complaint  Patient presents with  . Neurogenic claudication, R groin pain    rm 7, New Pt, son- Coralyn Mark    HISTORY OF PRESENT ILLNESS:   73 year old female here for evaluation of gait and balance difficulty.  Patient has progressively deteriorated in terms of her mobility over the past 2 years.  She has low back pain, abdominal pain, lower extremity pain, numbness and weakness.  Patient has progressively worsened to using a cane, then walker, now wheelchair.  She is able to use a cane in her home for short distances.  Pain, weakness and numbness much worse if she stands up or walks.  Sitting down slightly improves her symptoms.  Patient has also been diagnosed with polymyalgia rheumatica, right hip problems, low back pain.   Patient has history of right trigeminal shingles attack in 2008.  She has remote history of infectious mononucleosis.  Also history of breast cancer.  Also history of melanoma of the right wrist.  Patient was concerned about diagnosis of multiple sclerosis and requested further neurologic consultation.  REVIEW OF SYSTEMS: Full 14 system review of systems performed and negative with exception of: As per HPI.  ALLERGIES: Allergies  Allergen Reactions  . Cymbalta [Duloxetine Hcl] Other (See Comments)    sleepiness  . Gabapentin Other (See Comments)    sleepiness  . Adhesive [Tape] Dermatitis  . Betimol [Timolol Maleate]     Allergic to preservatives   . Cosopt [Dorzolamide Hcl-Timolol Mal] Other (See Comments)    Turns eye red, increases eye pressure  . Penicillins   . Prednisolone Acetate Other (See Comments)    Caused increased eye pressure  . Tamoxifen Other (See Comments)    Vaginal bleeding  . Thimerosal     Makes pt eye red     HOME MEDICATIONS:  Outpatient Medications Prior to Visit  Medication Sig Dispense Refill  . Ascorbic Acid (VITAMIN C) 1000 MG tablet Take 1,000 mg by mouth daily.    . Calcium Citrate-Vitamin D 250-200 MG-UNIT TABS Take 250 mg by mouth daily.     . Cholecalciferol (VITAMIN D-3) 5000 units TABS Take by mouth daily.    . Cyanocobalamin (B-12) 5000 MCG CAPS Take by mouth daily.    . fluorometholone (FML) 0.1 % ophthalmic suspension Place 1 drop into the right eye daily.    Marland Kitchen HYDROcodone-acetaminophen (NORCO) 10-325 MG tablet Take 1 tablet by mouth every 6 (six) hours as needed for severe pain. 120 tablet 0  . LORazepam (ATIVAN) 1 MG tablet Take 1 tablet (1 mg total) by mouth at bedtime. 30 tablet 0  . Magnesium 250 MG TABS Take 2 tablets by mouth daily.    . Multiple Vitamin (MULTIVITAMIN) capsule Take 1 capsule by mouth daily.    Marland Kitchen omeprazole (PRILOSEC) 40 MG capsule Take 40 mg by mouth.    . predniSONE (DELTASONE) 10 MG tablet Take 10 mg by mouth daily. With the 5mg     . predniSONE (DELTASONE) 5 MG tablet Take 5 mg by mouth daily with breakfast. Take along with the 10mg  tablet    . timolol (TIMOPTIC) 0.5 % ophthalmic solution PLACE 1 DROP INTO THE RIGHT EYE 2 TIMES DAILY.    . Turmeric (QC TUMERIC COMPLEX PO) Take 500 mg by mouth. 2 daily    . valACYclovir (  VALTREX) 1000 MG tablet Take 1,000 mg by mouth daily.     . predniSONE (DELTASONE) 1 MG tablet Take 1 mg by mouth as directed.    . predniSONE (DELTASONE) 1 MG tablet Take 2 mg by mouth daily with breakfast. Take along with the 10mg  and 5mg      No facility-administered medications prior to visit.     PAST MEDICAL HISTORY: Past Medical History:  Diagnosis Date  . Asymptomatic varicose veins   . Cancer (HCC)    breast   . Diverticulosis   . DM type 2 (diabetes mellitus, type 2) ([place]) 10/23/2014  . Dysuria   . GERD (gastroesophageal reflux disease)   . Hiatal hernia   . Hyperlipidemia   . Malignant neoplasm of breast (female), unspecified site   .  Migraine without aura, without mention of intractable migraine without mention of status migrainosus   . Other abnormal blood chemistry   . Pain in joint, pelvic region and thigh   . Shingles    in eyes  . Stricture and stenosis of esophagus   . Trigger finger (acquired)   . Unspecified cataract   . Unspecified glaucoma(365.9)     PAST SURGICAL HISTORY: Past Surgical History:  Procedure Laterality Date  . APPENDECTOMY  1973   Dr. Linzie Collin  . BREAST LUMPECTOMY Right 08/29/2008   Dr. Erroll Luna  . ESOPHAGEAL DILATION    . NASAL POLYP EXCISION  10/2015  . POLYPECTOMY  11/20/14   Uterine Polyp Removed    FAMILY HISTORY: Family History  Problem Relation Age of Onset  . Hypertension Mother   . Hyperlipidemia Mother   . Alzheimer's disease Mother   . Emphysema Father   . COPD Father     SOCIAL HISTORY: Social History   Socioeconomic History  . Marital status: Divorced    Spouse name: Not on file  . Number of children: 1  . Years of education: Not on file  . Highest education level: Some college, no degree  Occupational History    Comment: work from home  Social Needs  . Financial resource strain: Not hard at all  . Food insecurity    Worry: Never true    Inability: Never true  . Transportation needs    Medical: No    Non-medical: No  Tobacco Use  . Smoking status: Never Smoker  . Smokeless tobacco: Never Used  Substance and Sexual Activity  . Alcohol use: No  . Drug use: No  . Sexual activity: Not Currently  Lifestyle  . Physical activity    Days per week: 3 days    Minutes per session: 10 min  . Stress: To some extent  Relationships  . Social Herbalist on phone: Twice a week    Gets together: Twice a week    Attends religious service: Never    Active member of club or organization: No    Attends meetings of clubs or organizations: Never    Relationship status: Divorced  . Intimate partner violence    Fear of current or ex partner: No     Emotionally abused: No    Physically abused: No    Forced sexual activity: No  Other Topics Concern  . Not on file  Social History Narrative   Lives alone   Caffeine- coffee 2 c daily     PHYSICAL EXAM  GENERAL EXAM/CONSTITUTIONAL: Vitals:  Vitals:   06/06/19 1053  BP: (!) 149/79  Pulse: 80  Temp: 97.8 F (  36.6 C)  Weight: 234 lb (106.1 kg)  Height: 5\' 4"  (1.626 m)     Body mass index is 40.17 kg/m. Wt Readings from Last 3 Encounters:  06/06/19 234 lb (106.1 kg)  05/28/19 235 lb (106.6 kg)  02/12/19 249 lb (112.9 kg)     Patient is in no distress; well developed, nourished and groomed; neck is supple  CARDIOVASCULAR:  Examination of carotid arteries is normal; no carotid bruits  Regular rate and rhythm, no murmurs  Examination of peripheral vascular system by observation and palpation is normal  EYES:  Ophthalmoscopic exam of optic discs and posterior segments is normal; no papilledema or hemorrhages  No exam data present  MUSCULOSKELETAL:  Gait, strength, tone, movements noted in Neurologic exam below  NEUROLOGIC: MENTAL STATUS:  MMSE - [place] Exam 01/03/2018 10/29/2016 04/24/2014  Orientation to time 5 5 5   Orientation to Place 5 5 5   Registration 3 3 3   Attention/ Calculation 5 5 5   Recall 1 3 2   Language- name 2 objects 2 2 2   Language- repeat 1 1 1   Language- follow 3 step command 3 3 3   Language- read & follow direction 1 1 1   Write a sentence 1 1 1   Copy design 1 1 1   Total score 28 30 29     awake, alert, oriented to person, place and time  recent and remote memory intact  normal attention and concentration  language fluent, comprehension intact, naming intact  fund of knowledge appropriate  CRANIAL NERVE:   2nd - no papilledema on fundoscopic exam  2nd, 3rd, 4th, 6th - pupils equal and reactive to light, visual fields full to confrontation, extraocular muscles intact, no nystagmus  5th - facial sensation symmetric   7th - facial strength symmetric  8th - hearing intact  9th - palate elevates symmetrically, uvula midline  11th - shoulder shrug symmetric  12th - tongue protrusion midline  MOTOR:   normal bulk and tone, full strength in the BUE; BLE 2-3 PROX, 3 DISTAL  SENSORY:   normal and symmetric to light touch, temperature, vibration; EXCEPT DECR IN RLE > LLE   COORDINATION:   finger-nose-finger, fine finger movements normal  REFLEXES:   deep tendon reflexes TRACE and symmetric  GAIT/STATION:   IN WHEEL CHAIR     DIAGNOSTIC DATA (LABS, IMAGING, TESTING) - I reviewed patient records, labs, notes, testing and imaging myself where available.  Lab Results  Component Value Date   WBC 7.0 05/26/2017   HGB 12.7 05/26/2017   HCT 36.7 05/26/2017   MCV 92.9 05/26/2017   PLT 311 05/26/2017      Component Value Date/Time   NA 138 10/09/2018 1405   NA 141 03/27/2015 1041   NA 141 02/08/2013 1512   K 4.5 10/09/2018 1405   K 4.2 02/08/2013 1512   CL 99 10/09/2018 1405   CL 102 07/21/2012 1536   CO2 29 10/09/2018 1405   CO2 27 02/08/2013 1512   GLUCOSE 119 10/09/2018 1405   GLUCOSE 105 02/08/2013 1512   GLUCOSE 100 (H) 07/21/2012 1536   BUN 17 10/09/2018 1405   BUN 11 03/27/2015 1041   BUN 10.9 02/08/2013 1512   CREATININE 0.62 10/09/2018 1405   CREATININE 0.7 02/08/2013 1512   CALCIUM 10.3 10/09/2018 1405   CALCIUM 10.1 02/08/2013 1512   PROT 6.8 04/30/2016 1119   PROT 6.7 03/27/2015 1041   PROT 7.5 02/08/2013 1512   ALBUMIN 3.9 04/30/2016 1119   ALBUMIN 3.9 03/27/2015  1041   ALBUMIN 3.7 02/08/2013 1512   AST 19 04/30/2016 1119   AST 17 02/08/2013 1512   ALT 20 04/30/2016 1119   ALT 19 02/08/2013 1512   ALKPHOS 56 04/30/2016 1119   ALKPHOS 66 02/08/2013 1512   BILITOT 0.5 04/30/2016 1119   BILITOT 0.3 03/27/2015 1041   BILITOT 0.30 02/08/2013 1512   GFRNONAA 94 03/27/2015 1041   GFRAA 109 03/27/2015 1041   Lab Results  Component Value Date   CHOL 236 (H)  10/29/2016   HDL 60 10/29/2016   LDLCALC 146 (H) 10/29/2016   TRIG 149 10/29/2016   CHOLHDL 3.9 10/29/2016   Lab Results  Component Value Date   HGBA1C 6.0 (H) 10/09/2018   No results found for: VITAMINB12 No results found for: TSH   04/21/17 MRI lumbar spine  - Multilevel degenerative disc and facet disease involving the lumbar spine results in moderate canal and bilateral foraminal stenosis at L2-L3 and moderate canal stenosis L3-L4.  05/02/18 MRI lumbar spine - Similar multilevel degenerative changes of the lumbar spine with moderate canal stenosis at L3-L4 and L4-L5 and moderate left foraminal stenosis at L2-L3 and L3-L4.  10/03/18 MRI brain Calvarium/skull base: No focal marrow replacing lesion. Orbits: Right lens extraction. Paranasal sinuses: Right sphenoid sinus mucous retention cyst. Brain: No evidence of acute abnormality. Scattered foci of hyperintense T2 and FLAIR signal abnormality in the periventricular and subcortical white matter, likely reflecting sequelae of small vessel disease. No evidence of acute infarct. No mass effect, acute hemorrhage, or hydrocephalus. No abnormal enhancement identified. Grossly normal flow-related signal in the major intracranial arteries and dural sinuses. Additional comments: Mild cervical spondylosis with 2 mm anterolisthesis of C4 on C5. CONCLUSION: - No specific evidence for intracranial metastatic disease.   ASSESSMENT AND PLAN  73 y.o. year old female here with gait and balance difficulty since 2018, likely related to lumbar spinal stenosis and neurogenic claudication.  Dx:  1. Neurogenic claudication due to lumbar spinal stenosis     PLAN:  GAIT DIFFICULTY / LUMBAR SPINAL STENOSIS (with neurogenic claudication) - follow up with orthopedic clinic; consider repeat MRI lumbar spine  Return for return to PCP, pending if symptoms worsen or fail to improve.    Penni Bombard, MD [id number], [id number] AM Certified in  Neurology, Neurophysiology and Neuroimaging  [facility] [address] [place], [place] [address] [phone]

## 2019-06-06 NOTE — Patient Instructions (Signed)
  LUMBAR SPINAL STENOSIS - follow up with orthopedic clinic; consider repeat MRI lumbar spine

## 2019-06-13 ENCOUNTER — Other Ambulatory Visit: Payer: Self-pay | Admitting: *Deleted

## 2019-06-13 DIAGNOSIS — M5431 Sciatica, right side: Secondary | ICD-10-CM

## 2019-06-13 MED ORDER — HYDROCODONE-ACETAMINOPHEN 10-325 MG PO TABS: 1.0000 | ORAL_TABLET | Freq: Four times a day (QID) | ORAL | 0 refills | Status: DC | PRN

## 2019-06-13 NOTE — Telephone Encounter (Signed)
DUE 06/15/2019 Patient requested refill NCCSRS Database Verified LR: 05/17/2019 Pended Rx and sent to Dr. Mariea Clonts for approval.   No updated Narcotic contract.  Scheduled patient 06/25/2019 with Dr. Mariea Clonts to update contract.

## 2019-06-25 ENCOUNTER — Ambulatory Visit: Payer: Self-pay | Admitting: Internal Medicine

## 2019-06-25 ENCOUNTER — Other Ambulatory Visit: Payer: Self-pay

## 2019-06-25 ENCOUNTER — Encounter: Payer: Self-pay | Admitting: Internal Medicine

## 2019-06-25 ENCOUNTER — Ambulatory Visit (INDEPENDENT_AMBULATORY_CARE_PROVIDER_SITE_OTHER): Payer: Medicare Other | Admitting: Internal Medicine

## 2019-06-25 VITALS — BP 120/76 | HR 86 | Temp 96.2°F | Resp 21 | Ht 64.0 in | Wt 241.0 lb

## 2019-06-25 DIAGNOSIS — R1031 Right lower quadrant pain: Secondary | ICD-10-CM | POA: Diagnosis not present

## 2019-06-25 DIAGNOSIS — M353 Polymyalgia rheumatica: Secondary | ICD-10-CM | POA: Diagnosis not present

## 2019-06-25 DIAGNOSIS — M48062 Spinal stenosis, lumbar region with neurogenic claudication: Secondary | ICD-10-CM

## 2019-06-25 NOTE — Progress Notes (Signed)
Location:  [facility] clinic Provider: Taeler Winning L. Mariea Clonts, D.O., C.M.D.  Goals of Care:  Advanced Directives 06/25/2019  Does Patient Have a Medical Advance Directive? No  Would patient like information on creating a medical advance directive? -     Chief Complaint  Patient presents with  . Acute Visit    Narcotic Management Contract, Avanced Directive packet given     HPI: Patient is a 73 y.o. female seen today for an acute visit to do annual narcotic management contract update.    Advance directive packet was once again given to patient by CMA.  She is struggling to walk really badly today--had to use two canes to walk and then wheelchair to get in.  Says the hydrocodone-apap.    She has a new ortho appt tomorrow in [place].  They have been part of [facility].  They have now merged so hopefully we can get the notes.    When I sent her to neurology, they agreed it's likely due to her spinal stenosis.  Is now having some feet on the floor incontinence also--says when she sees the toilet, she starts going.  She is frustrated that she's been sick two years and nothing fixes it.  We discussed that she seemed to respond to prednisone at first for what we thought was polymyalgia rheumatica.    The right foot has gotten so numb that she's got to pick it up with her pants.    Neuro had also recommended f/u with ortho and repeat lumbar spine MRI.    From rheum, she was referred to pain mgt but never heard anything.  They also suggested PT and accupuncture.  She had been going to PT, but getting in there and back was too painful.  Right side always painful and in groin constantly.  When left also acts up, she has trouble moving.    Past Medical History:  Diagnosis Date  . Asymptomatic varicose veins   . Cancer (HCC)    breast   . Diverticulosis   . DM type 2 (diabetes mellitus, type 2) ([place]) 10/23/2014  . Dysuria   . GERD (gastroesophageal reflux disease)   . Hiatal hernia   .  Hyperlipidemia   . Malignant neoplasm of breast (female), unspecified site   . Migraine without aura, without mention of intractable migraine without mention of status migrainosus   . Other abnormal blood chemistry   . Pain in joint, pelvic region and thigh   . Shingles    in eyes  . Stricture and stenosis of esophagus   . Trigger finger (acquired)   . Unspecified cataract   . Unspecified glaucoma(365.9)     Past Surgical History:  Procedure Laterality Date  . APPENDECTOMY  1973   Dr. Linzie Collin  . BREAST LUMPECTOMY Right 08/29/2008   Dr. Erroll Luna  . ESOPHAGEAL DILATION    . NASAL POLYP EXCISION  10/2015  . POLYPECTOMY  11/20/14   Uterine Polyp Removed    Allergies  Allergen Reactions  . Cymbalta [Duloxetine Hcl] Other (See Comments)    sleepiness  . Gabapentin Other (See Comments)    sleepiness  . Adhesive [Tape] Dermatitis  . Betimol [Timolol Maleate]     Allergic to preservatives   . Cosopt [Dorzolamide Hcl-Timolol Mal] Other (See Comments)    Turns eye red, increases eye pressure  . Penicillins   . Prednisolone Acetate Other (See Comments)    Caused increased eye pressure  . Tamoxifen Other (See Comments)  Vaginal bleeding  . Thimerosal     Makes pt eye red     Outpatient Encounter Medications as of 06/25/2019  Medication Sig  . Ascorbic Acid (VITAMIN C) 1000 MG tablet Take 1,000 mg by mouth daily.  . Cholecalciferol (VITAMIN D-3) 5000 units TABS Take by mouth daily.  . Cyanocobalamin (B-12) 5000 MCG CAPS Take by mouth daily.  . fluorometholone (FML) 0.1 % ophthalmic suspension Place 1 drop into the right eye daily.  Marland Kitchen HYDROcodone-acetaminophen (NORCO) 10-325 MG tablet Take 1 tablet by mouth every 6 (six) hours as needed for severe pain.  Marland Kitchen LORazepam (ATIVAN) 1 MG tablet Take 1 tablet (1 mg total) by mouth at bedtime.  . Magnesium 250 MG TABS Take 2 tablets by mouth daily.  . Multiple Vitamin (MULTIVITAMIN) capsule Take 1 capsule by mouth daily.  Marland Kitchen  omeprazole (PRILOSEC) 40 MG capsule Take 40 mg by mouth.  . predniSONE (DELTASONE) 10 MG tablet Take 10 mg by mouth daily. With the 5mg   . predniSONE (DELTASONE) 5 MG tablet Take 5 mg by mouth daily with breakfast. Take along with the 10mg  tablet  . timolol (TIMOPTIC) 0.5 % ophthalmic solution PLACE 1 DROP INTO THE RIGHT EYE 2 TIMES DAILY.  . Turmeric (QC TUMERIC COMPLEX PO) Take 500 mg by mouth. 2 daily  . valACYclovir (VALTREX) 1000 MG tablet Take 1,000 mg by mouth daily.   . Calcium Citrate-Vitamin D 250-200 MG-UNIT TABS Take 250 mg by mouth daily.    No facility-administered encounter medications on file as of 06/25/2019.     Review of Systems:  Review of Systems  Constitutional: Positive for malaise/fatigue. Negative for chills and fever.  HENT: Negative for congestion and sore throat.   Eyes: Negative for blurred vision.  Respiratory: Negative for cough.   Cardiovascular: Positive for leg swelling. Negative for chest pain and palpitations.  Gastrointestinal: Negative for abdominal pain, constipation, diarrhea, heartburn, nausea and vomiting.  Genitourinary: Negative for dysuria.       Loss of control beginning  Musculoskeletal: Positive for back pain and joint pain.  Skin: Negative for itching and rash.  Neurological: Positive for tingling, sensory change, focal weakness and weakness. Negative for dizziness.  Psychiatric/Behavioral: Positive for depression. Negative for memory loss. The patient is nervous/anxious and has insomnia.     Health Maintenance  Topic Date Due  . Hepatitis C Screening  [birth year]  . COLONOSCOPY  03/26/2019  . TETANUS/TDAP  06/25/2019  . URINE MICROALBUMIN  10/09/2019  . MAMMOGRAM  04/14/2020  . INFLUENZA VACCINE  Completed  . DEXA SCAN  Completed  . PNA vac Low Risk Adult  Completed    Physical Exam: Vitals:   06/25/19 1344  BP: 120/76  Pulse: 86  Resp: (!) 21  Temp: (!) 96.2 F (35.7 C)  TempSrc: Oral  SpO2: 96%  Weight: 241 lb (109.3  kg)  Height: 5\' 4"  (1.626 m)   Body mass index is 41.37 kg/m. Physical Exam Vitals signs reviewed.  Constitutional:      Appearance: She is obese. She is not toxic-appearing.  HENT:     Head: Normocephalic and atraumatic.  Eyes:     Extraocular Movements: Extraocular movements intact.     Pupils: Pupils are equal, round, and reactive to light.  Cardiovascular:     Rate and Rhythm: Normal rate and regular rhythm.     Pulses: Normal pulses.     Heart sounds: Normal heart sounds.  Pulmonary:     Effort: Pulmonary effort is normal.  Breath sounds: Normal breath sounds. No wheezing, rhonchi or rales.  Abdominal:     General: Bowel sounds are normal. There is no distension.     Palpations: Abdomen is soft.     Tenderness: There is no abdominal tenderness.  Musculoskeletal: Normal range of motion.        General: No swelling.     Right lower leg: Edema present.     Left lower leg: Edema present.  Skin:    General: Skin is warm.     Coloration: Skin is pale.  Neurological:     Mental Status: She is alert and oriented to person, place, and time.     Cranial Nerves: No cranial nerve deficit.     Sensory: Sensory deficit present.     Motor: Weakness present.     Gait: Gait abnormal.     Deep Tendon Reflexes: Reflexes abnormal.     Comments: Weakness of right leg, tenderness of right groin, buttock/lumbar region  Psychiatric:     Comments: Tearful during visit--reports frustration about "not having answers" though record is now consistently indicating lumbar spinal stenosis     Labs reviewed: Basic Metabolic Panel: Recent Labs    10/09/18 1405  NA 138  K 4.5  CL 99  CO2 29  GLUCOSE 119  BUN 17  CREATININE 0.62  CALCIUM 10.3   Liver Function Tests: No results for input(s): AST, ALT, ALKPHOS, BILITOT, PROT, ALBUMIN in the last 8760 hours. No results for input(s): LIPASE, AMYLASE in the last 8760 hours. No results for input(s): AMMONIA in the last 8760  hours. CBC: No results for input(s): WBC, NEUTROABS, HGB, HCT, MCV, PLT in the last 8760 hours. Lipid Panel: No results for input(s): CHOL, HDL, LDLCALC, TRIG, CHOLHDL, LDLDIRECT in the last 8760 hours. Lab Results  Component Value Date   HGBA1C 6.0 (H) 10/09/2018    Procedures since last visit: No results found.  Assessment/Plan 1. Neurogenic claudication due to lumbar spinal stenosis -did not tolerate gabapentin -continues on hydrocodone and wanting more -has an appt with ortho in another system referred by a friend of hers -hard to keep track of her numerous specialists and outside appts -weakness of right leg, swelling of both and some urinary and bowel concerns now so likely needs neurosurgery -opioid and non-opioid controlled substance contracts updated for hydrocodone and lorazepam  2. Polymyalgia rheumatica (HCC) -still on steroid therapy -I'm less certain of this diagnosis--I think she feels better on the steroids and it may help with the inflammation in her lumbar spine  3. Right groin pain -suspect this is not due to her hip but her back -she does not accept that her hip is not the issue and that's part of why she's going to ortho  Labs/tests ordered:   Lab Orders  No laboratory test(s) ordered today   Next appt:  09/27/2019   Dorotha Hirschi L. Deontay Ladnier, D.O. [facility] [address], [place] [address] Cell Phone (Mon-Fri 8am-5pm):  [phone] On Call:  [phone] & follow prompts after 5pm & weekends Office Phone:  [phone] Office Fax:  [phone]

## 2019-06-27 ENCOUNTER — Ambulatory Visit: Payer: Self-pay | Admitting: Internal Medicine

## 2019-06-27 ENCOUNTER — Ambulatory Visit: Payer: Medicare Other | Admitting: Podiatry

## 2019-07-04 DIAGNOSIS — R269 Unspecified abnormalities of gait and mobility: Secondary | ICD-10-CM | POA: Diagnosis not present

## 2019-07-04 DIAGNOSIS — M48061 Spinal stenosis, lumbar region without neurogenic claudication: Secondary | ICD-10-CM | POA: Diagnosis not present

## 2019-07-13 ENCOUNTER — Other Ambulatory Visit: Payer: Self-pay

## 2019-07-13 DIAGNOSIS — M5431 Sciatica, right side: Secondary | ICD-10-CM

## 2019-07-13 MED ORDER — HYDROCODONE-ACETAMINOPHEN 10-325 MG PO TABS: 1.0000 | ORAL_TABLET | Freq: Four times a day (QID) | ORAL | 0 refills | Status: DC | PRN

## 2019-07-13 NOTE — Telephone Encounter (Signed)
Patient called requesting refill and was told at last appointment she will get refill this Friday. Last refill was 06/13/2019 and controlled medication contract is up-to-date. Routing to provider for approval.

## 2019-07-30 DIAGNOSIS — M2578 Osteophyte, vertebrae: Secondary | ICD-10-CM | POA: Diagnosis not present

## 2019-07-30 DIAGNOSIS — M50223 Other cervical disc displacement at C6-C7 level: Secondary | ICD-10-CM | POA: Diagnosis not present

## 2019-07-30 DIAGNOSIS — S22078A Other fracture of T9-T10 vertebra, initial encounter for closed fracture: Secondary | ICD-10-CM | POA: Diagnosis not present

## 2019-07-30 DIAGNOSIS — E119 Type 2 diabetes mellitus without complications: Secondary | ICD-10-CM | POA: Diagnosis not present

## 2019-07-30 DIAGNOSIS — R936 Abnormal findings on diagnostic imaging of limbs: Secondary | ICD-10-CM | POA: Diagnosis not present

## 2019-07-30 DIAGNOSIS — M5126 Other intervertebral disc displacement, lumbar region: Secondary | ICD-10-CM | POA: Diagnosis not present

## 2019-07-30 DIAGNOSIS — M5116 Intervertebral disc disorders with radiculopathy, lumbar region: Secondary | ICD-10-CM | POA: Diagnosis not present

## 2019-08-09 DIAGNOSIS — M160 Bilateral primary osteoarthritis of hip: Secondary | ICD-10-CM | POA: Diagnosis not present

## 2019-08-10 DIAGNOSIS — K219 Gastro-esophageal reflux disease without esophagitis: Secondary | ICD-10-CM | POA: Diagnosis not present

## 2019-08-13 ENCOUNTER — Telehealth: Payer: Self-pay | Admitting: *Deleted

## 2019-08-13 DIAGNOSIS — M5431 Sciatica, right side: Secondary | ICD-10-CM

## 2019-08-13 MED ORDER — HYDROCODONE-ACETAMINOPHEN 10-325 MG PO TABS: 1.0000 | ORAL_TABLET | Freq: Four times a day (QID) | ORAL | 0 refills | Status: DC | PRN

## 2019-08-13 NOTE — Telephone Encounter (Signed)
Patient called and requested refill on her Hydrocodone. Patient is requesting the frequency to be cut back to every 4-5 hours instead of 6. Stated that she is having increased pain. Patient stated that she does have hip surgery scheduled. Please Advise.   [place] Verified LR: 07/15/2019 Narcotic Contract updated.

## 2019-08-21 ENCOUNTER — Ambulatory Visit: Payer: Medicare Other | Admitting: Podiatry

## 2019-08-24 DIAGNOSIS — G8929 Other chronic pain: Secondary | ICD-10-CM | POA: Diagnosis not present

## 2019-08-24 DIAGNOSIS — M160 Bilateral primary osteoarthritis of hip: Secondary | ICD-10-CM | POA: Diagnosis not present

## 2019-08-24 DIAGNOSIS — M25552 Pain in left hip: Secondary | ICD-10-CM | POA: Diagnosis not present

## 2019-08-24 DIAGNOSIS — M25551 Pain in right hip: Secondary | ICD-10-CM | POA: Diagnosis not present

## 2019-09-10 ENCOUNTER — Other Ambulatory Visit: Payer: Self-pay | Admitting: Internal Medicine

## 2019-09-10 NOTE — Telephone Encounter (Signed)
Last filled in epic on 05/27/2020, non opioid treatment agreement on file

## 2019-09-11 ENCOUNTER — Other Ambulatory Visit: Payer: Self-pay | Admitting: *Deleted

## 2019-09-11 DIAGNOSIS — M5431 Sciatica, right side: Secondary | ICD-10-CM

## 2019-09-11 MED ORDER — HYDROCODONE-ACETAMINOPHEN 10-325 MG PO TABS: 1.0000 | ORAL_TABLET | Freq: Four times a day (QID) | ORAL | 0 refills | Status: DC | PRN
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
## 2019-09-14 DIAGNOSIS — M545 Low back pain: Secondary | ICD-10-CM | POA: Diagnosis not present

## 2019-09-14 DIAGNOSIS — C799 Secondary malignant neoplasm of unspecified site: Secondary | ICD-10-CM | POA: Diagnosis not present

## 2019-09-14 DIAGNOSIS — Z85820 Personal history of malignant melanoma of skin: Secondary | ICD-10-CM | POA: Diagnosis not present

## 2019-09-14 DIAGNOSIS — Z08 Encounter for follow-up examination after completed treatment for malignant neoplasm: Secondary | ICD-10-CM | POA: Diagnosis not present

## 2019-09-14 DIAGNOSIS — M25519 Pain in unspecified shoulder: Secondary | ICD-10-CM | POA: Diagnosis not present

## 2019-09-14 DIAGNOSIS — Z170 Estrogen receptor positive status [ER+]: Secondary | ICD-10-CM | POA: Diagnosis not present

## 2019-09-14 DIAGNOSIS — J339 Nasal polyp, unspecified: Secondary | ICD-10-CM | POA: Diagnosis not present

## 2019-09-14 DIAGNOSIS — Z9221 Personal history of antineoplastic chemotherapy: Secondary | ICD-10-CM | POA: Diagnosis not present

## 2019-09-14 DIAGNOSIS — Z9011 Acquired absence of right breast and nipple: Secondary | ICD-10-CM | POA: Diagnosis not present

## 2019-09-14 DIAGNOSIS — G4700 Insomnia, unspecified: Secondary | ICD-10-CM | POA: Diagnosis not present

## 2019-09-14 DIAGNOSIS — Z853 Personal history of malignant neoplasm of breast: Secondary | ICD-10-CM | POA: Diagnosis not present

## 2019-09-14 DIAGNOSIS — C50411 Malignant neoplasm of upper-outer quadrant of right female breast: Secondary | ICD-10-CM | POA: Diagnosis not present

## 2019-09-14 DIAGNOSIS — Z9289 Personal history of other medical treatment: Secondary | ICD-10-CM | POA: Diagnosis not present

## 2019-09-14 DIAGNOSIS — R739 Hyperglycemia, unspecified: Secondary | ICD-10-CM | POA: Diagnosis not present

## 2019-09-14 DIAGNOSIS — Z923 Personal history of irradiation: Secondary | ICD-10-CM | POA: Diagnosis not present

## 2019-09-14 DIAGNOSIS — R1032 Left lower quadrant pain: Secondary | ICD-10-CM | POA: Diagnosis not present

## 2019-09-14 DIAGNOSIS — C50911 Malignant neoplasm of unspecified site of right female breast: Secondary | ICD-10-CM | POA: Diagnosis not present

## 2019-09-14 DIAGNOSIS — C4361 Malignant melanoma of right upper limb, including shoulder: Secondary | ICD-10-CM | POA: Diagnosis not present

## 2019-09-18 DIAGNOSIS — M353 Polymyalgia rheumatica: Secondary | ICD-10-CM | POA: Diagnosis not present

## 2019-09-18 DIAGNOSIS — M160 Bilateral primary osteoarthritis of hip: Secondary | ICD-10-CM | POA: Diagnosis not present

## 2019-09-18 DIAGNOSIS — Z7952 Long term (current) use of systemic steroids: Secondary | ICD-10-CM | POA: Diagnosis not present

## 2019-09-18 DIAGNOSIS — M810 Age-related osteoporosis without current pathological fracture: Secondary | ICD-10-CM | POA: Diagnosis not present

## 2019-09-18 DIAGNOSIS — M25519 Pain in unspecified shoulder: Secondary | ICD-10-CM | POA: Diagnosis not present

## 2019-09-19 DIAGNOSIS — Z01812 Encounter for preprocedural laboratory examination: Secondary | ICD-10-CM | POA: Diagnosis not present

## 2019-09-19 DIAGNOSIS — Z20822 Contact with and (suspected) exposure to covid-19: Secondary | ICD-10-CM | POA: Diagnosis not present

## 2019-09-19 DIAGNOSIS — C439 Malignant melanoma of skin, unspecified: Secondary | ICD-10-CM | POA: Diagnosis not present

## 2019-09-25 ENCOUNTER — Other Ambulatory Visit: Payer: Medicare Other

## 2019-09-25 DIAGNOSIS — Z170 Estrogen receptor positive status [ER+]: Secondary | ICD-10-CM | POA: Diagnosis not present

## 2019-09-25 DIAGNOSIS — Z853 Personal history of malignant neoplasm of breast: Secondary | ICD-10-CM | POA: Diagnosis not present

## 2019-09-25 DIAGNOSIS — C50411 Malignant neoplasm of upper-outer quadrant of right female breast: Secondary | ICD-10-CM | POA: Diagnosis not present

## 2019-09-25 DIAGNOSIS — Z85820 Personal history of malignant melanoma of skin: Secondary | ICD-10-CM | POA: Diagnosis not present

## 2019-09-25 DIAGNOSIS — R600 Localized edema: Secondary | ICD-10-CM | POA: Diagnosis not present

## 2019-09-25 DIAGNOSIS — C7989 Secondary malignant neoplasm of other specified sites: Secondary | ICD-10-CM | POA: Diagnosis not present

## 2019-09-25 DIAGNOSIS — C799 Secondary malignant neoplasm of unspecified site: Secondary | ICD-10-CM | POA: Diagnosis not present

## 2019-09-27 ENCOUNTER — Ambulatory Visit: Payer: Medicare Other | Admitting: Internal Medicine

## 2019-10-05 DIAGNOSIS — Z79899 Other long term (current) drug therapy: Secondary | ICD-10-CM | POA: Diagnosis not present

## 2019-10-05 DIAGNOSIS — H25812 Combined forms of age-related cataract, left eye: Secondary | ICD-10-CM | POA: Diagnosis not present

## 2019-10-05 DIAGNOSIS — B0051 Herpesviral iridocyclitis: Secondary | ICD-10-CM | POA: Diagnosis not present

## 2019-10-05 DIAGNOSIS — D3131 Benign neoplasm of right choroid: Secondary | ICD-10-CM | POA: Diagnosis not present

## 2019-10-05 DIAGNOSIS — R768 Other specified abnormal immunological findings in serum: Secondary | ICD-10-CM | POA: Diagnosis not present

## 2019-10-05 DIAGNOSIS — H04123 Dry eye syndrome of bilateral lacrimal glands: Secondary | ICD-10-CM | POA: Diagnosis not present

## 2019-10-05 DIAGNOSIS — H4041X2 Glaucoma secondary to eye inflammation, right eye, moderate stage: Secondary | ICD-10-CM | POA: Diagnosis not present

## 2019-10-05 DIAGNOSIS — H209 Unspecified iridocyclitis: Secondary | ICD-10-CM | POA: Diagnosis not present

## 2019-10-05 DIAGNOSIS — Z961 Presence of intraocular lens: Secondary | ICD-10-CM | POA: Diagnosis not present

## 2019-10-05 DIAGNOSIS — H527 Unspecified disorder of refraction: Secondary | ICD-10-CM | POA: Diagnosis not present

## 2019-10-05 DIAGNOSIS — H02401 Unspecified ptosis of right eyelid: Secondary | ICD-10-CM | POA: Diagnosis not present

## 2019-10-09 ENCOUNTER — Other Ambulatory Visit: Payer: Self-pay | Admitting: *Deleted

## 2019-10-09 DIAGNOSIS — M5431 Sciatica, right side: Secondary | ICD-10-CM

## 2019-10-09 MED ORDER — HYDROCODONE-ACETAMINOPHEN 10-325 MG PO TABS: 1.0000 | ORAL_TABLET | Freq: Four times a day (QID) | ORAL | 0 refills | Status: DC | PRN

## 2019-10-09 NOTE — Telephone Encounter (Signed)
Patient requested refill NCCSRS Database Verified LR: 09/12/2019 Narcotic contract updated Pended Rx and sent to Dr. Mariea Clonts for approval.

## 2019-11-08 ENCOUNTER — Other Ambulatory Visit: Payer: Self-pay | Admitting: *Deleted

## 2019-11-08 DIAGNOSIS — M5431 Sciatica, right side: Secondary | ICD-10-CM

## 2019-11-08 MED ORDER — HYDROCODONE-ACETAMINOPHEN 10-325 MG PO TABS: 1.0000 | ORAL_TABLET | Freq: Four times a day (QID) | ORAL | 0 refills | Status: DC | PRN

## 2019-11-08 NOTE — Telephone Encounter (Signed)
Patient requested refill NCCSRS Database Verified LR: 10/12/2019 Contract updated Pended Rx and sent to Dr. Mariea Clonts for approval.

## 2019-11-09 ENCOUNTER — Other Ambulatory Visit: Payer: Self-pay

## 2019-11-09 ENCOUNTER — Encounter: Payer: Self-pay | Admitting: Podiatry

## 2019-11-09 ENCOUNTER — Ambulatory Visit (INDEPENDENT_AMBULATORY_CARE_PROVIDER_SITE_OTHER): Payer: Medicare Other | Admitting: Podiatry

## 2019-11-09 VITALS — Temp 98.0°F

## 2019-11-09 DIAGNOSIS — M79674 Pain in right toe(s): Secondary | ICD-10-CM | POA: Diagnosis not present

## 2019-11-09 DIAGNOSIS — B351 Tinea unguium: Secondary | ICD-10-CM | POA: Diagnosis not present

## 2019-11-09 DIAGNOSIS — M79675 Pain in left toe(s): Secondary | ICD-10-CM | POA: Diagnosis not present

## 2019-11-09 DIAGNOSIS — E1142 Type 2 diabetes mellitus with diabetic polyneuropathy: Secondary | ICD-10-CM

## 2019-11-09 NOTE — Progress Notes (Signed)
This patient returns to my office for at risk foot care.  This patient requires this care by a professional since this patient will be at risk due to having diabetes.  This patient is unable to cut nails herself since the patient cannot reach her nails.These nails are painful walking and wearing shoes.  This patient presents for at risk foot care today.  She presents to the office in a wheelchair and with her son.  General Appearance  Alert, conversant and in no acute stress.  Vascular  Dorsalis pedis and posterior tibial  pulses are palpable  bilaterally.  Capillary return is within normal limits  bilaterally. Temperature is within normal limits  bilaterally.  Neurologic  Senn-Weinstein monofilament wire test within normal limits  bilaterally. Muscle power within normal limits bilaterally.  Nails Thick disfigured discolored nails with subungual debris  Hallux nails  B/L.   No evidence of bacterial infection or drainage bilaterally.  Orthopedic  No limitations of motion  feet .  No crepitus or effusions noted.  No bony pathology or digital deformities noted.  Skin  normotropic skin with no porokeratosis noted bilaterally.  No signs of infections or ulcers noted.     Onychomycosis  Pain in right toes  Pain in left toes  Consent was obtained for treatment procedures.   Mechanical debridement of nails 1-5  bilaterally performed with a nail nipper.  Filed with dremel without incident.    Return office visit     prn                Told patient to return for periodic foot care and evaluation due to potential at risk complications.   Gardiner Barefoot DPM

## 2019-11-13 DIAGNOSIS — M160 Bilateral primary osteoarthritis of hip: Secondary | ICD-10-CM | POA: Diagnosis not present

## 2019-11-16 DIAGNOSIS — M160 Bilateral primary osteoarthritis of hip: Secondary | ICD-10-CM | POA: Diagnosis not present

## 2019-11-16 DIAGNOSIS — M25551 Pain in right hip: Secondary | ICD-10-CM | POA: Diagnosis not present

## 2019-11-16 DIAGNOSIS — R799 Abnormal finding of blood chemistry, unspecified: Secondary | ICD-10-CM | POA: Diagnosis not present

## 2019-11-23 ENCOUNTER — Other Ambulatory Visit: Payer: Self-pay

## 2019-11-23 ENCOUNTER — Encounter: Payer: Self-pay | Admitting: Family

## 2019-11-23 ENCOUNTER — Other Ambulatory Visit: Payer: Medicare Other

## 2019-11-23 ENCOUNTER — Ambulatory Visit (INDEPENDENT_AMBULATORY_CARE_PROVIDER_SITE_OTHER): Payer: Medicare Other | Admitting: Family

## 2019-11-23 VITALS — BP 130/78 | HR 82 | Temp 96.2°F | Resp 20 | Ht 64.0 in | Wt 231.6 lb

## 2019-11-23 DIAGNOSIS — Z01818 Encounter for other preprocedural examination: Secondary | ICD-10-CM

## 2019-11-23 DIAGNOSIS — M353 Polymyalgia rheumatica: Secondary | ICD-10-CM

## 2019-11-23 DIAGNOSIS — R7303 Prediabetes: Secondary | ICD-10-CM

## 2019-11-23 DIAGNOSIS — E6609 Other obesity due to excess calories: Secondary | ICD-10-CM

## 2019-11-23 DIAGNOSIS — M25551 Pain in right hip: Secondary | ICD-10-CM | POA: Diagnosis not present

## 2019-11-23 DIAGNOSIS — Z6839 Body mass index (BMI) 39.0-39.9, adult: Secondary | ICD-10-CM | POA: Diagnosis not present

## 2019-11-23 DIAGNOSIS — E66812 Obesity, class 2: Secondary | ICD-10-CM

## 2019-11-23 NOTE — Progress Notes (Signed)
Provider: Cadyn Rodger FNP-C  Gayland Curry, DO  Patient Care Team: Gayland Curry, DO as PCP - General (Geriatric Medicine) Magrinat, Virgie Dad, MD as Consulting Physician (Oncology) Erroll Luna, MD as Consulting Physician (General Surgery) Harvie Heck, MD as Physician Assistant (Internal Medicine) Chyrel Masson, DO as Anesthesiologist (Otolaryngology) Clinger, Chrystie Nose, MD (Otolaryngology) Turner Daniels, MD as Referring Physician (Internal Medicine)  Extended Emergency Contact Information Primary Emergency Contact: Choudhry,Terry Address: [place], [place] Phone: [phone] Work Phone: [phone] Mobile Phone: [phone] Relation: Son  Code Status: Full Code  Goals of care: Advanced Directive information Advanced Directives 11/23/2019  Does Patient Have a Medical Advance Directive? No  Would patient like information on creating a medical advance directive? -     Chief Complaint  Patient presents with  . Acute Visit    Surgical Clearance for hip replacement    HPI:  Pt is a 74 y.o. female seen today for an acute visit for surgical clearance for right hip Arthroplasty to be done by Dr.Lennen at [facility].She usually follows up with PCP Dr.Reed Jonelle Sidle  she states right hip/groin and sometimes the entire leg hurts rating 10/10 on scale.she takes Norco 10-325 mg tablet every 6 hours as needed.States walks with a walker at home.    Past Medical History:  Diagnosis Date  . Asymptomatic varicose veins   . Cancer (HCC)    breast   . Diverticulosis   . DM type 2 (diabetes mellitus, type 2) ([place]) 10/23/2014  . Dysuria   . GERD (gastroesophageal reflux disease)   . Hiatal hernia   . Hyperlipidemia   . Malignant neoplasm of breast (female), unspecified site   . Migraine without aura, without mention of intractable migraine without mention of  status migrainosus   . Other abnormal blood chemistry   . Pain in joint, pelvic region and thigh   . Shingles    in eyes  . Stricture and stenosis of esophagus   . Trigger finger (acquired)   . Unspecified cataract   . Unspecified glaucoma(365.9)    Past Surgical History:  Procedure Laterality Date  . APPENDECTOMY  1973   Dr. Linzie Collin  . BREAST LUMPECTOMY Right 08/29/2008   Dr. Erroll Luna  . ESOPHAGEAL DILATION    . NASAL POLYP EXCISION  10/2015  . POLYPECTOMY  11/20/14   Uterine Polyp Removed    Allergies  Allergen Reactions  . Cymbalta [Duloxetine Hcl] Other (See Comments)    sleepiness  . Gabapentin Other (See Comments)    sleepiness  . Adhesive [Tape] Dermatitis  . Betimol [Timolol Maleate]     Allergic to preservatives   . Cosopt [Dorzolamide Hcl-Timolol Mal] Other (See Comments)    Turns eye red, increases eye pressure  . Penicillins   . Prednisolone Acetate Other (See Comments)    Caused increased eye pressure  . Tamoxifen Other (See Comments)    Vaginal bleeding  . Thimerosal     Makes pt eye red     Outpatient Encounter Medications as of 11/23/2019  Medication Sig  . Ascorbic Acid (VITAMIN C) 1000 MG tablet Take 1,000 mg by mouth daily.  . Calcium Citrate-Vitamin D 250-200 MG-UNIT TABS Take 500 mg by mouth daily.   . Cholecalciferol (VITAMIN D-3) 5000 units TABS Take by mouth daily.  . Cyanocobalamin 5000 MCG CAPS cyanocobalamin (vitamin B-12) 5,000  mcg capsule  Take 1 capsule every day by oral route.  . diclofenac Sodium (VOLTAREN) 1 % GEL   . fluorometholone (FML) 0.1 % ophthalmic suspension INSTILL ONE DROP IN RIGHT EYE DAILY  . HYDROcodone-acetaminophen (NORCO) 10-325 MG tablet Take 1 tablet by mouth every 6 (six) hours as needed for severe pain.  Marland Kitchen LORazepam (ATIVAN) 1 MG tablet Take 1 mg by mouth at bedtime as needed for anxiety.  . Magnesium 250 MG TABS Take 2 tablets by mouth daily.  . Multiple Vitamin (MULTIVITAMIN) capsule Take 1 capsule by  mouth daily.  Marland Kitchen omeprazole (PRILOSEC) 20 MG capsule Take 20 mg by mouth every other day.   Marland Kitchen omeprazole (PRILOSEC) 40 MG capsule Take 40 mg by mouth every other day.   . predniSONE (DELTASONE) 1 MG tablet PLEASE WEAN DOWN ON YOUR PREDNISONE BY 1 MG EVERY 4 WEEKS AS TOLERATED  . timolol (TIMOPTIC) 0.5 % ophthalmic solution Place 1 drop into the right eye 2 times daily.  . Turmeric (QC TUMERIC COMPLEX PO) Take 2,000 mg by mouth daily.   . valACYclovir (VALTREX) 1000 MG tablet Take 1,000 mg by mouth every other day.   . zinc gluconate 50 MG tablet Take by mouth.  . [DISCONTINUED] LORazepam (ATIVAN) 1 MG tablet TAKE 1 TABLET BY MOUTH AT BEDTIME (Patient taking differently: PRN)   No facility-administered encounter medications on file as of 11/23/2019.    Review of Systems  Constitutional: Negative for appetite change, chills, fatigue and fever.  HENT: Negative for congestion, rhinorrhea, sinus pressure, sinus pain, sneezing, sore throat and trouble swallowing.   Eyes: Negative for pain, discharge, redness and itching.  Respiratory: Negative for cough, chest tightness, shortness of breath and wheezing.   Cardiovascular: Positive for leg swelling. Negative for chest pain and palpitations.  Gastrointestinal: Negative for abdominal distention, abdominal pain, constipation, diarrhea, nausea and vomiting.  Endocrine: Negative for cold intolerance, heat intolerance, polydipsia, polyphagia and polyuria.  Genitourinary: Negative for difficulty urinating, dysuria, flank pain, frequency and urgency.       Incontinence   Musculoskeletal: Positive for arthralgias, back pain and gait problem. Negative for joint swelling and myalgias.       Right hip pain   Skin: Negative for color change, pallor, rash and wound.  Neurological: Negative for dizziness, speech difficulty, weakness, light-headedness and headaches.       Chronic numbness on right foot toes   Psychiatric/Behavioral: Negative for agitation,  confusion and sleep disturbance. The patient is not nervous/anxious.     Immunization History  Administered Date(s) Administered  . Influenza Whole 07/26/2010  . Influenza, High Dose Seasonal PF 05/03/2019  . Influenza,inj,Quad PF,6+ Mos 04/24/2014, 04/01/2015, 05/05/2016  . Influenza-Unspecified 06/30/2010, 04/25/2013, 05/31/2018  . Pneumococcal Conjugate-13 10/23/2014  . Pneumococcal Polysaccharide-23 10/29/2016  . Td 06/24/2009  . Tdap 07/26/2008  . Zoster 10/17/2013   Pertinent  Health Maintenance Due  Topic Date Due  . COLONOSCOPY  03/26/2019  . URINE MICROALBUMIN  10/09/2019  . INFLUENZA VACCINE  02/24/2020  . MAMMOGRAM  04/14/2020  . DEXA SCAN  Completed  . PNA vac Low Risk Adult  Completed   Fall Risk  11/23/2019 06/25/2019 05/28/2019 02/12/2019 01/24/2019  Falls in the past year? 1 0 0 0 0  Number falls in past yr: 0 - 0 0 0  Injury with Fall? 1 0 - 0 0    Vitals:   11/23/19 1440  BP: 130/78  Pulse: 82  Resp: 20  Temp: (!) 96.2 F (35.7 C)  TempSrc: Oral  SpO2: 94%  Weight: 231 lb 9.6 oz (105.1 kg)  Height: 5\' 4"  (1.626 m)   Body mass index is 39.75 kg/m. Physical Exam Vitals reviewed.  Constitutional:      General: She is not in acute distress.    Appearance: She is obese. She is not ill-appearing.  HENT:     Nose: Nose normal. No congestion or rhinorrhea.     Mouth/Throat:     Mouth: Mucous membranes are moist.     Pharynx: Oropharynx is clear. No oropharyngeal exudate or posterior oropharyngeal erythema.  Eyes:     General: No scleral icterus.       Right eye: No discharge.        Left eye: No discharge.     Extraocular Movements: Extraocular movements intact.     Conjunctiva/sclera: Conjunctivae normal.     Pupils: Pupils are equal, round, and reactive to light.  Neck:     Vascular: No carotid bruit.  Cardiovascular:     Rate and Rhythm: Normal rate and regular rhythm.     Pulses: Normal pulses.     Heart sounds: Normal heart sounds. No  murmur. No friction rub. No gallop.   Pulmonary:     Effort: Pulmonary effort is normal. No respiratory distress.     Breath sounds: Normal breath sounds. No wheezing, rhonchi or rales.  Chest:     Chest wall: No tenderness.  Abdominal:     General: Bowel sounds are normal. There is no distension.     Palpations: Abdomen is soft. There is no mass.     Tenderness: There is no abdominal tenderness. There is no right CVA tenderness, left CVA tenderness, guarding or rebound.  Musculoskeletal:        General: No tenderness.     Cervical back: Normal range of motion. No rigidity or tenderness.     Comments: Unsteady gait on wheelchair during visit.Bilateral lower extremities non-pitting edema.  Lymphadenopathy:     Cervical: No cervical adenopathy.  Skin:    General: Skin is warm.     Coloration: Skin is not pale.     Findings: No bruising, erythema or rash.  Neurological:     Mental Status: She is alert and oriented to person, place, and time.     Cranial Nerves: No cranial nerve deficit.     Coordination: Coordination normal.     Gait: Gait abnormal.  Psychiatric:        Mood and Affect: Mood normal.        Behavior: Behavior normal.        Thought Content: Thought content normal.        Judgment: Judgment normal.    Labs reviewed: No results for input(s): NA, K, CL, CO2, GLUCOSE, BUN, CREATININE, CALCIUM, MG, PHOS in the last 8760 hours. No results for input(s): AST, ALT, ALKPHOS, BILITOT, PROT, ALBUMIN in the last 8760 hours. No results for input(s): WBC, NEUTROABS, HGB, HCT, MCV, PLT in the last 8760 hours. No results found for: TSH Lab Results  Component Value Date   HGBA1C 6.0 (H) 10/09/2018   Lab Results  Component Value Date   CHOL 236 (H) 10/29/2016   HDL 60 10/29/2016   LDLCALC 146 (H) 10/29/2016   TRIG 149 10/29/2016   CHOLHDL 3.9 10/29/2016    Significant Diagnostic Results in last 30 days:  No results found.  Assessment/Plan 1. Polymyalgia rheumatica  ([place]) - continue on current pain regimen.  - Sedimentation rate - C-reactive  protein  2. Class 2 obesity due to excess calories without serious comorbidity with body mass index (BMI) of 39.0 to 39.9 in adult Dietary modification.exercise limited due to pain.  - Lipid panel - CBC with Differential/Platelet  3. Prediabetes Lab Results  Component Value Date   HGBA1C 6.0 (H) 10/09/2018  Dietary modification advised.will recheck A1C. - Hemoglobin A1c - COMPLETE METABOLIC PANEL WITH GFR  4. Preop testing Cleared  for right hip Arthroplasty with Dr.Lennen at [facility].No cardiac issues and not on any Aspirin or anticoagulant. - EKG 12-Lead indicates Normal sinus Rhythm.  Pre-op Clearance form filled and signed and faxed by CMA to Dr.Lennen.   Family/ staff Communication: Reviewed plan of care with patient verbalized understanding.  Labs/tests ordered:  - Lipid panel - CBC with Differential/Platelet - Sedimentation rate - C-reactive protein - Hemoglobin A1c - CMP - EKG 12-Lead Next Appointment: Has upcoming appointment with Dr.Reed 12/10/2019   Sandrea Hughs, NP

## 2019-11-23 NOTE — Progress Notes (Signed)
This encounter was created in error - please disregard.

## 2019-11-24 LAB — COMPLETE METABOLIC PANEL WITH GFR
AG Ratio: 1.7 (calc) (ref 1.0–2.5)
ALT: 13 U/L (ref 6–29)
AST: 14 U/L (ref 10–35)
Albumin: 4.1 g/dL (ref 3.6–5.1)
Alkaline phosphatase (APISO): 84 U/L (ref 37–153)
BUN: 18 mg/dL (ref 7–25)
CO2: 27 mmol/L (ref 20–32)
Calcium: 9.9 mg/dL (ref 8.6–10.4)
Chloride: 102 mmol/L (ref 98–110)
Creat: 0.61 mg/dL (ref 0.60–0.93)
GFR, Est African American: 104 mL/min/{1.73_m2} (ref >=60)
GFR, Est Non African American: 89 mL/min/{1.73_m2} (ref >=60)
Globulin: 2.4 g/dL (calc) (ref 1.9–3.7)
Glucose, Bld: 112 mg/dL — ABNORMAL HIGH (ref 65–99)
Potassium: 4.1 mmol/L (ref 3.5–5.3)
Sodium: 139 mmol/L (ref 135–146)
Total Bilirubin: 0.5 mg/dL (ref 0.2–1.2)
Total Protein: 6.5 g/dL (ref 6.1–8.1)

## 2019-11-24 LAB — CBC WITH DIFFERENTIAL/PLATELET
Absolute Monocytes: 576 cells/uL (ref 200–950)
Basophils Absolute: 60 cells/uL (ref 0–200)
Basophils Relative: 0.9 %
Eosinophils Absolute: 107 cells/uL (ref 15–500)
Eosinophils Relative: 1.6 %
HCT: 37.8 % (ref 35.0–45.0)
Hemoglobin: 12.9 g/dL (ref 11.7–15.5)
Lymphs Abs: 1715 cells/uL (ref 850–3900)
MCH: 33.5 pg — ABNORMAL HIGH (ref 27.0–33.0)
MCHC: 34.1 g/dL (ref 32.0–36.0)
MCV: 98.2 fL (ref 80.0–100.0)
MPV: 10.3 fL (ref 7.5–12.5)
Monocytes Relative: 8.6 %
Neutro Abs: 4241 cells/uL (ref 1500–7800)
Neutrophils Relative %: 63.3 %
Platelets: 258 10*3/uL (ref 140–400)
RBC: 3.85 10*6/uL (ref 3.80–5.10)
RDW: 11.4 % (ref 11.0–15.0)
Total Lymphocyte: 25.6 %
WBC: 6.7 10*3/uL (ref 3.8–10.8)

## 2019-11-24 LAB — HEMOGLOBIN A1C
Hgb A1c MFr Bld: 5.7 % of total Hgb — ABNORMAL HIGH (ref <5.7)
Mean Plasma Glucose: 117 (calc)
eAG (mmol/L): 6.5 (calc)

## 2019-11-24 LAB — LIPID PANEL
Cholesterol: 227 mg/dL — ABNORMAL HIGH (ref <200)
HDL: 63 mg/dL (ref >=50)
LDL Cholesterol (Calc): 133 mg/dL (calc) — ABNORMAL HIGH
Non-HDL Cholesterol (Calc): 164 mg/dL (calc) — ABNORMAL HIGH (ref <130)
Total CHOL/HDL Ratio: 3.6 (calc) (ref <5.0)
Triglycerides: 171 mg/dL — ABNORMAL HIGH (ref <150)

## 2019-11-24 LAB — C-REACTIVE PROTEIN: CRP: 1.8 mg/L (ref <8.0)

## 2019-11-24 LAB — SEDIMENTATION RATE: Sed Rate: 19 mm/h (ref 0–30)

## 2019-11-30 ENCOUNTER — Other Ambulatory Visit: Payer: Medicare Other

## 2019-11-30 ENCOUNTER — Telehealth: Payer: Self-pay | Admitting: *Deleted

## 2019-11-30 NOTE — Telephone Encounter (Signed)
Will need Physical therapy to evaluate for appropriate wheelchair size.

## 2019-11-30 NOTE — Telephone Encounter (Signed)
It will be better for Physical Therapy to evaluate  or Orthopedic to evaluation after surgery for an appropriate wheelchair.

## 2019-11-30 NOTE — Telephone Encounter (Signed)
She states she needs one now to get to and from her Dr. Thomasene Lot. Stated that she had to borrow ours just to get into building.

## 2019-11-30 NOTE — Telephone Encounter (Signed)
Patient called and stated that she was just seen on 11/23/19. Stated that she needs a rx written for a Wheelchair to help her get in and out of Dr. Shonna Chock. Stated that she is having hip surgery on 12/12/2019. Please Advise.

## 2019-12-03 NOTE — Telephone Encounter (Signed)
LMOM to return call.

## 2019-12-03 NOTE — Telephone Encounter (Signed)
Patient notified and agreed.  

## 2019-12-06 ENCOUNTER — Other Ambulatory Visit: Payer: Self-pay | Admitting: *Deleted

## 2019-12-06 DIAGNOSIS — M5431 Sciatica, right side: Secondary | ICD-10-CM

## 2019-12-06 NOTE — Telephone Encounter (Signed)
Patient requested refill [place]CCSRS Database Verified LR: 11/08/2019 Contract updated Pended Rx and sent to Dr. Mariea Clonts for approval.

## 2019-12-07 ENCOUNTER — Telehealth: Payer: Self-pay | Admitting: *Deleted

## 2019-12-07 DIAGNOSIS — Z20822 Contact with and (suspected) exposure to covid-19: Secondary | ICD-10-CM | POA: Diagnosis not present

## 2019-12-07 DIAGNOSIS — Z01812 Encounter for preprocedural laboratory examination: Secondary | ICD-10-CM | POA: Diagnosis not present

## 2019-12-07 DIAGNOSIS — M160 Bilateral primary osteoarthritis of hip: Secondary | ICD-10-CM | POA: Diagnosis not present

## 2019-12-07 MED ORDER — HYDROCODONE-ACETAMINOPHEN 10-325 MG PO TABS: 1.0000 | ORAL_TABLET | Freq: Four times a day (QID) | ORAL | 0 refills | Status: DC | PRN

## 2019-12-07 NOTE — Telephone Encounter (Signed)
Debbie with [facility] called requesting an EKG done on 11/23/19 for clearance for Hip Replacement Surgery. Stated that patient was in office and they were needing a copy of the recent EKG. Faxed to [facility] at Fax: [phone]

## 2019-12-10 ENCOUNTER — Telehealth: Payer: Self-pay

## 2019-12-10 ENCOUNTER — Telehealth (INDEPENDENT_AMBULATORY_CARE_PROVIDER_SITE_OTHER): Payer: Medicare Other | Admitting: Internal Medicine

## 2019-12-10 ENCOUNTER — Other Ambulatory Visit: Payer: Self-pay

## 2019-12-10 ENCOUNTER — Encounter: Payer: Self-pay | Admitting: Internal Medicine

## 2019-12-10 DIAGNOSIS — E7849 Other hyperlipidemia: Secondary | ICD-10-CM | POA: Diagnosis not present

## 2019-12-10 DIAGNOSIS — R7303 Prediabetes: Secondary | ICD-10-CM | POA: Diagnosis not present

## 2019-12-10 DIAGNOSIS — M48062 Spinal stenosis, lumbar region with neurogenic claudication: Secondary | ICD-10-CM | POA: Diagnosis not present

## 2019-12-10 DIAGNOSIS — M1611 Unilateral primary osteoarthritis, right hip: Secondary | ICD-10-CM

## 2019-12-10 DIAGNOSIS — M353 Polymyalgia rheumatica: Secondary | ICD-10-CM | POA: Diagnosis not present

## 2019-12-10 NOTE — Telephone Encounter (Signed)
Patient consent to having the tele health visit

## 2019-12-10 NOTE — Progress Notes (Signed)
Patient ID: [name], female   DOB: [birth year], 74 y.o.   MRN: [id number] This service is provided via telemedicine  No vital signs collected/recorded due to the encounter was a telemedicine visit.   Location of patient (ex: home, work):  home  Patient consents to a telephone visit:  yes  Location of the provider (ex: office, home):  [place] Name of any referring provider:Dr. Hollace Kinnier DO Names of all persons participating in the telemedicine service and their role in the encounter:  Hollace Kinnier, DO, [place] and Patient  Time spent on call:  69min    Provider:  Yeng Frankie L. Mariea Clonts, D.O., C.M.D.  Code Status: full code Goals of Care:  Advanced Directives 11/23/2019  Does Patient Have a Medical Advance Directive? No  Would patient like information on creating a medical advance directive? -   Chief Complaint  Patient presents with  . Medical Management of Chronic Issues    4 month follow up    HPI: Patient is a 74 y.o. female seen today for medical management of chronic diseases.    Has hip surgery coming up Wednesday.  Discussed that orthopedics should manage her pain in the perioperative period and then when she recovers, if she continues to have chronic pain I would handle that.    hba1c is down to 5.7.  She went to a class to watch her carbs and gained weight.  She now weighs 229 lbs.   Liver panel, electrolytes, kidney function all ok.  Bad cholesterol was well above goal--133.  She does keep good good cholesterol.  She does not want to take cholesterol meds.  Never eats fried foods.  She had a fall 5 wks ago and bruised her tail bone.  She was being so careful not to fall.  She was walking with a walker--she does not know what did it.  Said her hip popped that she got thrown off balance.  She was rolling over the carpet.  She went to the side and landed on a big brass container and a metal planter went over and poked her in the butt.  Had to use a donut for several weeks.   Had bruises knee to hip.  Was really painful until 1.5 wks ago.   Right hip is the one being replaced.     Says she had the polymyalgia starting in November of '18.    Past Medical History:  Diagnosis Date  . Asymptomatic varicose veins   . Cancer (HCC)    breast   . Diverticulosis   . DM type 2 (diabetes mellitus, type 2) ([place]) 10/23/2014  . Dysuria   . GERD (gastroesophageal reflux disease)   . Hiatal hernia   . Hyperlipidemia   . Malignant neoplasm of breast (female), unspecified site   . Migraine without aura, without mention of intractable migraine without mention of status migrainosus   . Other abnormal blood chemistry   . Pain in joint, pelvic region and thigh   . Shingles    in eyes  . Stricture and stenosis of esophagus   . Trigger finger (acquired)   . Unspecified cataract   . Unspecified glaucoma(365.9)     Past Surgical History:  Procedure Laterality Date  . APPENDECTOMY  1973   Dr. Linzie Collin  . BREAST LUMPECTOMY Right 08/29/2008   Dr. Erroll Luna  . ESOPHAGEAL DILATION    . NASAL POLYP EXCISION  10/2015  . POLYPECTOMY  11/20/14   Uterine Polyp Removed  Allergies  Allergen Reactions  . Cymbalta [Duloxetine Hcl] Other (See Comments)    sleepiness  . Gabapentin Other (See Comments)    sleepiness  . Adhesive [Tape] Dermatitis  . Betimol [Timolol Maleate]     Allergic to preservatives   . Cosopt [Dorzolamide Hcl-Timolol Mal] Other (See Comments)    Turns eye red, increases eye pressure  . Penicillins   . Prednisolone Acetate Other (See Comments)    Caused increased eye pressure  . Tamoxifen Other (See Comments)    Vaginal bleeding  . Thimerosal     Makes pt eye red     Outpatient Encounter Medications as of 12/10/2019  Medication Sig  . Ascorbic Acid (VITAMIN C) 1000 MG tablet Take 1,000 mg by mouth daily.  . Calcium Citrate-Vitamin D 250-200 MG-UNIT TABS Take 500 mg by mouth daily.   . Cholecalciferol (VITAMIN D-3) 5000 units TABS Take by  mouth daily.  . Cyanocobalamin 5000 MCG CAPS cyanocobalamin (vitamin B-12) 5,000 mcg capsule  Take 1 capsule every day by oral route.  . diclofenac Sodium (VOLTAREN) 1 % GEL   . fluorometholone (FML) 0.1 % ophthalmic suspension INSTILL ONE DROP IN RIGHT EYE DAILY  . HYDROcodone-acetaminophen (NORCO) 10-325 MG tablet Take 1 tablet by mouth every 6 (six) hours as needed for severe pain.  Marland Kitchen LORazepam (ATIVAN) 1 MG tablet Take 1 mg by mouth at bedtime as needed for anxiety.  . Magnesium 250 MG TABS Take 2 tablets by mouth daily.  . Multiple Vitamin (MULTIVITAMIN) capsule Take 1 capsule by mouth daily.  Marland Kitchen omeprazole (PRILOSEC) 20 MG capsule Take 20 mg by mouth every other day.   Marland Kitchen omeprazole (PRILOSEC) 40 MG capsule Take 40 mg by mouth every other day.   . predniSONE (DELTASONE) 1 MG tablet PLEASE WEAN DOWN ON YOUR PREDNISONE BY 1 MG EVERY 4 WEEKS AS TOLERATED  . timolol (TIMOPTIC) 0.5 % ophthalmic solution Place 1 drop into the right eye 2 times daily.  . Turmeric (QC TUMERIC COMPLEX PO) Take 2,000 mg by mouth daily.   . valACYclovir (VALTREX) 1000 MG tablet Take 1,000 mg by mouth every other day.   . zinc gluconate 50 MG tablet Take by mouth.   No facility-administered encounter medications on file as of 12/10/2019.    Review of Systems:  Review of Systems  Constitutional: Positive for malaise/fatigue. Negative for chills and fever.  HENT: Negative for congestion.   Eyes: Negative for blurred vision.  Respiratory: Negative for cough and shortness of breath.   Cardiovascular: Positive for leg swelling. Negative for chest pain and palpitations.  Gastrointestinal: Negative for abdominal pain.  Genitourinary: Negative for dysuria.  Musculoskeletal: Positive for back pain, falls and joint pain.  Neurological: Negative for dizziness and loss of consciousness.  Psychiatric/Behavioral: Negative for depression and memory loss. The patient is not nervous/anxious and does not have insomnia.      Health Maintenance  Topic Date Due  . Hepatitis C Screening  Never done  . COVID-19 Vaccine (1) Never done  . COLONOSCOPY  03/26/2019  . TETANUS/TDAP  06/25/2019  . URINE MICROALBUMIN  10/09/2019  . INFLUENZA VACCINE  02/24/2020  . MAMMOGRAM  04/14/2020  . DEXA SCAN  Completed  . PNA vac Low Risk Adult  Completed    Physical Exam: Could not be performed as visit non face-to-face via phone   Labs reviewed: Basic Metabolic Panel: Recent Labs    11/23/19 1424  NA 139  K 4.1  CL 102  CO2 27  GLUCOSE 112*  BUN 18  CREATININE 0.61  CALCIUM 9.9   Liver Function Tests: Recent Labs    11/23/19 1424  AST 14  ALT 13  BILITOT 0.5  PROT 6.5   No results for input(s): LIPASE, AMYLASE in the last 8760 hours. No results for input(s): AMMONIA in the last 8760 hours. CBC: Recent Labs    11/23/19 1424  WBC 6.7  NEUTROABS 4,241  HGB 12.9  HCT 37.8  MCV 98.2  PLT 258   Lipid Panel: Recent Labs    11/23/19 1424  CHOL 227*  HDL 63  LDLCALC 133*  TRIG 171*  CHOLHDL 3.6   Lab Results  Component Value Date   HGBA1C 5.7 (H) 11/23/2019   Assessment/Plan 1. Osteoarthritis of right hip -is for right hip replacement Wed -ortho may manage pain until she is through postop period and done with initial ortho follow ups, then I will resume her pain mgt  2. Polymyalgia rheumatica ([place]) -continue on prednisone 12mg  daily   3. Prediabetes -cont to work on improving diet  4. Spinal stenosis of lumbar region with neurogenic claudication -continue norco as is for now--we'll see if she can be weaned at all   5. Other hyperlipidemia -LDL above goal--consider new combination med with zetia since not interested in taking statins  Labs/tests ordered:  Just had labs Next appt:  12/10/2019 Non face-to-face time spent on televisit:  24 mins  Arika Mainer L. Doaa Kendzierski, D.O. [facility] [address], [place] [address] Cell Phone  (Mon-Fri 8am-5pm):  [phone] On Call:  [phone] & follow prompts after 5pm & weekends Office Phone:  [phone] Office Fax:  [phone]

## 2019-12-12 DIAGNOSIS — Z7409 Other reduced mobility: Secondary | ICD-10-CM | POA: Diagnosis not present

## 2019-12-12 DIAGNOSIS — M1611 Unilateral primary osteoarthritis, right hip: Secondary | ICD-10-CM | POA: Diagnosis not present

## 2019-12-12 DIAGNOSIS — E119 Type 2 diabetes mellitus without complications: Secondary | ICD-10-CM | POA: Diagnosis not present

## 2019-12-13 ENCOUNTER — Telehealth: Payer: Self-pay | Admitting: *Deleted

## 2019-12-13 DIAGNOSIS — E119 Type 2 diabetes mellitus without complications: Secondary | ICD-10-CM | POA: Diagnosis not present

## 2019-12-13 DIAGNOSIS — M1611 Unilateral primary osteoarthritis, right hip: Secondary | ICD-10-CM | POA: Diagnosis not present

## 2019-12-13 DIAGNOSIS — Z7409 Other reduced mobility: Secondary | ICD-10-CM | POA: Diagnosis not present

## 2019-12-13 NOTE — Telephone Encounter (Signed)
Albertina Senegal, PA with [facility] called and requested to manage patients Post Op Pain Medication since having joint replacement. Stated that patient informed him that she had signed a pain contract with our office. Verbal Consent given that he could manage until Postop period was done. He Agreed.  I reviewed Dr. Cyndi Lennert last OV Note: Assessment/Plan 1. Osteoarthritis of right hip -is for right hip replacement Wed -ortho Margia Wiesen manage pain until she is through postop period and done with initial ortho follow ups, then I will resume her pain mgt

## 2019-12-14 DIAGNOSIS — Z96641 Presence of right artificial hip joint: Secondary | ICD-10-CM | POA: Diagnosis not present

## 2019-12-14 DIAGNOSIS — M353 Polymyalgia rheumatica: Secondary | ICD-10-CM | POA: Diagnosis not present

## 2019-12-14 DIAGNOSIS — Z471 Aftercare following joint replacement surgery: Secondary | ICD-10-CM | POA: Diagnosis not present

## 2019-12-14 DIAGNOSIS — Z9181 History of falling: Secondary | ICD-10-CM

## 2019-12-14 DIAGNOSIS — Z7982 Long term (current) use of aspirin: Secondary | ICD-10-CM | POA: Diagnosis not present

## 2019-12-14 DIAGNOSIS — Z79891 Long term (current) use of opiate analgesic: Secondary | ICD-10-CM | POA: Diagnosis not present

## 2019-12-14 DIAGNOSIS — M1612 Unilateral primary osteoarthritis, left hip: Secondary | ICD-10-CM | POA: Diagnosis not present

## 2019-12-14 DIAGNOSIS — Z7952 Long term (current) use of systemic steroids: Secondary | ICD-10-CM

## 2019-12-17 ENCOUNTER — Telehealth: Payer: Self-pay | Admitting: *Deleted

## 2019-12-17 NOTE — Telephone Encounter (Signed)
The concern would be oversedation/drowsiness from too much lorazepam especially in context of pain medications.  It's ok for her to go ahead and take her prednisone as usual.

## 2019-12-17 NOTE — Telephone Encounter (Signed)
Merry Proud Notified and agreed.

## 2019-12-17 NOTE — Telephone Encounter (Signed)
Patient called and stated that she made a mistake this morning taking her medications. Stated that she believes she took 2 of her 1mg  Lorazepams instead of taking 2 of her 5mg  Prednisone.  Patient is not sure what to do and concerned.  Please Advise.

## 2019-12-17 NOTE — Telephone Encounter (Signed)
Patient notified and agreed.  

## 2019-12-17 NOTE — Telephone Encounter (Signed)
[name] with [facility] called requesting verbal orders for PT 1x1week, 2x4weeks, 1x4weeks. Due to Hip Replacement.  Verbal orders Ok? Please Advise.

## 2019-12-17 NOTE — Telephone Encounter (Signed)
Yes, ok to approve the home health orders as per protocol

## 2019-12-18 DIAGNOSIS — Z96641 Presence of right artificial hip joint: Secondary | ICD-10-CM | POA: Diagnosis not present

## 2019-12-18 DIAGNOSIS — Z79891 Long term (current) use of opiate analgesic: Secondary | ICD-10-CM | POA: Diagnosis not present

## 2019-12-18 DIAGNOSIS — Z471 Aftercare following joint replacement surgery: Secondary | ICD-10-CM | POA: Diagnosis not present

## 2019-12-18 DIAGNOSIS — Z7982 Long term (current) use of aspirin: Secondary | ICD-10-CM | POA: Diagnosis not present

## 2019-12-18 DIAGNOSIS — M353 Polymyalgia rheumatica: Secondary | ICD-10-CM | POA: Diagnosis not present

## 2019-12-18 DIAGNOSIS — M1612 Unilateral primary osteoarthritis, left hip: Secondary | ICD-10-CM | POA: Diagnosis not present

## 2019-12-19 ENCOUNTER — Other Ambulatory Visit: Payer: Self-pay

## 2019-12-19 DIAGNOSIS — F419 Anxiety disorder, unspecified: Secondary | ICD-10-CM

## 2019-12-19 MED ORDER — LORAZEPAM 1 MG PO TABS: 1.0000 mg | ORAL_TABLET | Freq: Every evening | ORAL | 0 refills | Status: DC | PRN

## 2019-12-19 NOTE — Telephone Encounter (Signed)
Patient called to request a refill on her Lorazepam and that her therapist said she needs a lipedema pump for her hip and she said she wasn't sure who to ask about it so she is requesting it from our office and her surgeons office but we have not received any fax or other request for such equipment

## 2019-12-19 NOTE — Telephone Encounter (Signed)
I have never ordered a lymphedema pump and not sure which medical supply stores or DME companies have them.  I would expect that since this is related to her surgery, the surgeon's office would take care of ordering this.  If that does not happen, we can take care of it.

## 2019-12-20 ENCOUNTER — Telehealth: Payer: Self-pay | Admitting: *Deleted

## 2019-12-20 DIAGNOSIS — Z79891 Long term (current) use of opiate analgesic: Secondary | ICD-10-CM | POA: Diagnosis not present

## 2019-12-20 DIAGNOSIS — Z7982 Long term (current) use of aspirin: Secondary | ICD-10-CM | POA: Diagnosis not present

## 2019-12-20 DIAGNOSIS — Z96641 Presence of right artificial hip joint: Secondary | ICD-10-CM | POA: Diagnosis not present

## 2019-12-20 DIAGNOSIS — M353 Polymyalgia rheumatica: Secondary | ICD-10-CM | POA: Diagnosis not present

## 2019-12-20 DIAGNOSIS — Z471 Aftercare following joint replacement surgery: Secondary | ICD-10-CM | POA: Diagnosis not present

## 2019-12-20 DIAGNOSIS — M1612 Unilateral primary osteoarthritis, left hip: Secondary | ICD-10-CM | POA: Diagnosis not present

## 2019-12-20 NOTE — Telephone Encounter (Signed)
[name] with [facility] called requesting Verbal orders for OT 1x3wks and 1x2wks. Verbal orders given per office protochol.

## 2019-12-26 DIAGNOSIS — Z471 Aftercare following joint replacement surgery: Secondary | ICD-10-CM | POA: Diagnosis not present

## 2019-12-26 DIAGNOSIS — M1612 Unilateral primary osteoarthritis, left hip: Secondary | ICD-10-CM | POA: Diagnosis not present

## 2019-12-26 DIAGNOSIS — Z79891 Long term (current) use of opiate analgesic: Secondary | ICD-10-CM | POA: Diagnosis not present

## 2019-12-26 DIAGNOSIS — Z96641 Presence of right artificial hip joint: Secondary | ICD-10-CM | POA: Diagnosis not present

## 2019-12-26 DIAGNOSIS — M353 Polymyalgia rheumatica: Secondary | ICD-10-CM | POA: Diagnosis not present

## 2019-12-26 DIAGNOSIS — Z7982 Long term (current) use of aspirin: Secondary | ICD-10-CM | POA: Diagnosis not present

## 2019-12-28 DIAGNOSIS — M1612 Unilateral primary osteoarthritis, left hip: Secondary | ICD-10-CM | POA: Diagnosis not present

## 2019-12-28 DIAGNOSIS — Z7982 Long term (current) use of aspirin: Secondary | ICD-10-CM | POA: Diagnosis not present

## 2019-12-28 DIAGNOSIS — Z96641 Presence of right artificial hip joint: Secondary | ICD-10-CM | POA: Diagnosis not present

## 2019-12-28 DIAGNOSIS — Z79891 Long term (current) use of opiate analgesic: Secondary | ICD-10-CM | POA: Diagnosis not present

## 2019-12-28 DIAGNOSIS — Z471 Aftercare following joint replacement surgery: Secondary | ICD-10-CM | POA: Diagnosis not present

## 2019-12-28 DIAGNOSIS — M353 Polymyalgia rheumatica: Secondary | ICD-10-CM | POA: Diagnosis not present

## 2019-12-31 DIAGNOSIS — M1612 Unilateral primary osteoarthritis, left hip: Secondary | ICD-10-CM | POA: Diagnosis not present

## 2019-12-31 DIAGNOSIS — Z79891 Long term (current) use of opiate analgesic: Secondary | ICD-10-CM | POA: Diagnosis not present

## 2019-12-31 DIAGNOSIS — Z471 Aftercare following joint replacement surgery: Secondary | ICD-10-CM | POA: Diagnosis not present

## 2019-12-31 DIAGNOSIS — M353 Polymyalgia rheumatica: Secondary | ICD-10-CM | POA: Diagnosis not present

## 2019-12-31 DIAGNOSIS — Z7982 Long term (current) use of aspirin: Secondary | ICD-10-CM | POA: Diagnosis not present

## 2019-12-31 DIAGNOSIS — Z96641 Presence of right artificial hip joint: Secondary | ICD-10-CM | POA: Diagnosis not present

## 2020-01-02 DIAGNOSIS — M353 Polymyalgia rheumatica: Secondary | ICD-10-CM | POA: Diagnosis not present

## 2020-01-02 DIAGNOSIS — Z96641 Presence of right artificial hip joint: Secondary | ICD-10-CM | POA: Diagnosis not present

## 2020-01-02 DIAGNOSIS — M1612 Unilateral primary osteoarthritis, left hip: Secondary | ICD-10-CM | POA: Diagnosis not present

## 2020-01-02 DIAGNOSIS — Z7982 Long term (current) use of aspirin: Secondary | ICD-10-CM | POA: Diagnosis not present

## 2020-01-02 DIAGNOSIS — Z471 Aftercare following joint replacement surgery: Secondary | ICD-10-CM | POA: Diagnosis not present

## 2020-01-02 DIAGNOSIS — Z79891 Long term (current) use of opiate analgesic: Secondary | ICD-10-CM | POA: Diagnosis not present

## 2020-01-04 DIAGNOSIS — Z7982 Long term (current) use of aspirin: Secondary | ICD-10-CM | POA: Diagnosis not present

## 2020-01-04 DIAGNOSIS — Z79891 Long term (current) use of opiate analgesic: Secondary | ICD-10-CM | POA: Diagnosis not present

## 2020-01-04 DIAGNOSIS — Z471 Aftercare following joint replacement surgery: Secondary | ICD-10-CM | POA: Diagnosis not present

## 2020-01-04 DIAGNOSIS — M1612 Unilateral primary osteoarthritis, left hip: Secondary | ICD-10-CM | POA: Diagnosis not present

## 2020-01-04 DIAGNOSIS — M353 Polymyalgia rheumatica: Secondary | ICD-10-CM | POA: Diagnosis not present

## 2020-01-04 DIAGNOSIS — Z96641 Presence of right artificial hip joint: Secondary | ICD-10-CM | POA: Diagnosis not present

## 2020-01-07 DIAGNOSIS — M353 Polymyalgia rheumatica: Secondary | ICD-10-CM | POA: Diagnosis not present

## 2020-01-07 DIAGNOSIS — Z7982 Long term (current) use of aspirin: Secondary | ICD-10-CM | POA: Diagnosis not present

## 2020-01-07 DIAGNOSIS — Z96641 Presence of right artificial hip joint: Secondary | ICD-10-CM | POA: Diagnosis not present

## 2020-01-07 DIAGNOSIS — M1612 Unilateral primary osteoarthritis, left hip: Secondary | ICD-10-CM | POA: Diagnosis not present

## 2020-01-07 DIAGNOSIS — Z79891 Long term (current) use of opiate analgesic: Secondary | ICD-10-CM | POA: Diagnosis not present

## 2020-01-07 DIAGNOSIS — Z471 Aftercare following joint replacement surgery: Secondary | ICD-10-CM | POA: Diagnosis not present

## 2020-01-09 DIAGNOSIS — Z7982 Long term (current) use of aspirin: Secondary | ICD-10-CM | POA: Diagnosis not present

## 2020-01-09 DIAGNOSIS — M1612 Unilateral primary osteoarthritis, left hip: Secondary | ICD-10-CM | POA: Diagnosis not present

## 2020-01-09 DIAGNOSIS — Z471 Aftercare following joint replacement surgery: Secondary | ICD-10-CM | POA: Diagnosis not present

## 2020-01-09 DIAGNOSIS — Z96641 Presence of right artificial hip joint: Secondary | ICD-10-CM | POA: Diagnosis not present

## 2020-01-09 DIAGNOSIS — M353 Polymyalgia rheumatica: Secondary | ICD-10-CM | POA: Diagnosis not present

## 2020-01-09 DIAGNOSIS — Z79891 Long term (current) use of opiate analgesic: Secondary | ICD-10-CM | POA: Diagnosis not present

## 2020-01-13 DIAGNOSIS — Z96641 Presence of right artificial hip joint: Secondary | ICD-10-CM | POA: Diagnosis not present

## 2020-01-13 DIAGNOSIS — M353 Polymyalgia rheumatica: Secondary | ICD-10-CM | POA: Diagnosis not present

## 2020-01-13 DIAGNOSIS — M1612 Unilateral primary osteoarthritis, left hip: Secondary | ICD-10-CM | POA: Diagnosis not present

## 2020-01-13 DIAGNOSIS — Z9181 History of falling: Secondary | ICD-10-CM | POA: Diagnosis not present

## 2020-01-13 DIAGNOSIS — Z7982 Long term (current) use of aspirin: Secondary | ICD-10-CM | POA: Diagnosis not present

## 2020-01-13 DIAGNOSIS — Z7952 Long term (current) use of systemic steroids: Secondary | ICD-10-CM | POA: Diagnosis not present

## 2020-01-13 DIAGNOSIS — Z79891 Long term (current) use of opiate analgesic: Secondary | ICD-10-CM | POA: Diagnosis not present

## 2020-01-13 DIAGNOSIS — Z471 Aftercare following joint replacement surgery: Secondary | ICD-10-CM | POA: Diagnosis not present

## 2020-01-16 DIAGNOSIS — M353 Polymyalgia rheumatica: Secondary | ICD-10-CM | POA: Diagnosis not present

## 2020-01-16 DIAGNOSIS — Z471 Aftercare following joint replacement surgery: Secondary | ICD-10-CM | POA: Diagnosis not present

## 2020-01-16 DIAGNOSIS — Z79891 Long term (current) use of opiate analgesic: Secondary | ICD-10-CM | POA: Diagnosis not present

## 2020-01-16 DIAGNOSIS — M1612 Unilateral primary osteoarthritis, left hip: Secondary | ICD-10-CM | POA: Diagnosis not present

## 2020-01-16 DIAGNOSIS — Z96641 Presence of right artificial hip joint: Secondary | ICD-10-CM | POA: Diagnosis not present

## 2020-01-16 DIAGNOSIS — Z7982 Long term (current) use of aspirin: Secondary | ICD-10-CM | POA: Diagnosis not present

## 2020-01-17 ENCOUNTER — Encounter: Payer: Medicare Other | Admitting: Family

## 2020-01-22 ENCOUNTER — Encounter: Payer: Self-pay | Admitting: Family

## 2020-01-22 ENCOUNTER — Telehealth: Payer: Self-pay

## 2020-01-22 ENCOUNTER — Other Ambulatory Visit: Payer: Self-pay

## 2020-01-22 ENCOUNTER — Ambulatory Visit (INDEPENDENT_AMBULATORY_CARE_PROVIDER_SITE_OTHER): Payer: Medicare Other | Admitting: Family

## 2020-01-22 DIAGNOSIS — Z0000 Encounter for general adult medical examination without abnormal findings: Secondary | ICD-10-CM

## 2020-01-22 DIAGNOSIS — Z1211 Encounter for screening for malignant neoplasm of colon: Secondary | ICD-10-CM | POA: Diagnosis not present

## 2020-01-22 NOTE — Progress Notes (Signed)
    This service is provided via telemedicine  No vital signs collected/recorded due to the encounter was a telemedicine visit.   Location of patient (ex: home, work): Home.  Patient consents to a telephone visit: Yes.  Location of the provider (ex: office, home):  [facility].  Name of any referring provider: N/A  Names of all persons participating in the telemedicine service and their role in the encounter:  Patient, Jeanifer Halliday, RMA, Ngetich, Dinah, NP.    Time spent on call: 8 minutes spent on the phone with Medical Assistant.   

## 2020-01-22 NOTE — Telephone Encounter (Signed)
I spoke with Lonn Georgia from [facility] and gave the verbal order per Sherrie Mustache NP for the Occupational Therapy

## 2020-01-22 NOTE — Telephone Encounter (Signed)
Kayla from [facility] called to request an order for Occupational Therapy for patient can I give her a verbal from you   Please Advise

## 2020-01-22 NOTE — Telephone Encounter (Signed)
[name], [name] are scheduled for a virtual visit with your provider today.    Just as we do with appointments in the office, we must obtain your consent to participate.  Your consent will be active for this visit and any virtual visit you may have with one of our providers in the next 365 days.    If you have a MyChart account, I can also send a copy of this consent to you electronically.  All virtual visits are billed to your insurance company just like a traditional visit in the office.  As this is a virtual visit, video technology does not allow for your provider to perform a traditional examination.  This may limit your provider's ability to fully assess your condition.  If your provider identifies any concerns that need to be evaluated in person or the need to arrange testing such as labs, EKG, etc, we will make arrangements to do so.    Although advances in technology are sophisticated, we cannot ensure that it will always work on either your end or our end.  If the connection with a video visit is poor, we may have to switch to a telephone visit.  With either a video or telephone visit, we are not always able to ensure that we have a secure connection.   I need to obtain your verbal consent now.   Are you willing to proceed with your visit today?   [name] has provided verbal consent on 01/22/2020 for a virtual visit (video or telephone).   [place], [place] 01/22/2020  2:03 PM

## 2020-01-22 NOTE — Patient Instructions (Signed)
[name] , Thank you for taking time to come for your Medicare Wellness Visit. I appreciate your ongoing commitment to your health goals. Please review the following plan we discussed and let me know if I can assist you in the future.   Screening recommendations/referrals: Colonoscopy: Cologuard ordered as requested.please mail back per direction.  Mammogram: Post poned  Bone Density: Up to date  Recommended yearly ophthalmology/optometry visit for glaucoma screening and checkup Recommended yearly dental visit for hygiene and checkup  Vaccinations: Influenza vaccine: Up to date  Pneumococcal vaccine : Up to date  Tdap vaccine : Post poned  Shingles vaccine:    Advanced directives:No   Conditions/risks identified: Advance age female > 100 yrs,Dyslipidemia,Obesity BMI> 30,Sedentary  Next appointment: 1 year    Preventive Care 21 Years and Older, Female Preventive care refers to lifestyle choices and visits with your health care provider that can promote health and wellness. What does preventive care include?  A yearly physical exam. This is also called an annual well check.  Dental exams once or twice a year.  Routine eye exams. Ask your health care provider how often you should have your eyes checked.  Personal lifestyle choices, including:  Daily care of your teeth and gums.  Regular physical activity.  Eating a healthy diet.  Avoiding tobacco and drug use.  Limiting alcohol use.  Practicing safe sex.  Taking low-dose aspirin every day.  Taking vitamin and mineral supplements as recommended by your health care provider. What happens during an annual well check? The services and screenings done by your health care provider during your annual well check will depend on your age, overall health, lifestyle risk factors, and family history of disease. Counseling  Your health care provider may ask you questions about your:  Alcohol use.  Tobacco use.  Drug  use.  Emotional well-being.  Home and relationship well-being.  Sexual activity.  Eating habits.  History of falls.  Memory and ability to understand (cognition).  Work and work Statistician.  Reproductive health. Screening  You may have the following tests or measurements:  Height, weight, and BMI.  Blood pressure.  Lipid and cholesterol levels. These may be checked every 5 years, or more frequently if you are over 55 years old.  Skin check.  Lung cancer screening. You may have this screening every year starting at age 83 if you have a 30-pack-year history of smoking and currently smoke or have quit within the past 15 years.  Fecal occult blood test (FOBT) of the stool. You may have this test every year starting at age 77.  Flexible sigmoidoscopy or colonoscopy. You may have a sigmoidoscopy every 5 years or a colonoscopy every 10 years starting at age 10.  Hepatitis C blood test.  Hepatitis B blood test.  Sexually transmitted disease (STD) testing.  Diabetes screening. This is done by checking your blood sugar (glucose) after you have not eaten for a while (fasting). You may have this done every 1-3 years.  Bone density scan. This is done to screen for osteoporosis. You may have this done starting at age 40.  Mammogram. This may be done every 1-2 years. Talk to your health care provider about how often you should have regular mammograms. Talk with your health care provider about your test results, treatment options, and if necessary, the need for more tests. Vaccines  Your health care provider may recommend certain vaccines, such as:  Influenza vaccine. This is recommended every year.  Tetanus, diphtheria, and  acellular pertussis (Tdap, Td) vaccine. You may need a Td booster every 10 years.  Zoster vaccine. You may need this after age 58.  Pneumococcal 13-valent conjugate (PCV13) vaccine. One dose is recommended after age 59.  Pneumococcal polysaccharide  (PPSV23) vaccine. One dose is recommended after age 13. Talk to your health care provider about which screenings and vaccines you need and how often you need them. This information is not intended to replace advice given to you by your health care provider. Make sure you discuss any questions you have with your health care provider. Document Released: 08/08/2015 Document Revised: 03/31/2016 Document Reviewed: 05/13/2015 Elsevier Interactive Patient Education  2017 [place] Prevention in the Home Falls can cause injuries. They can happen to people of all ages. There are many things you can do to make your home safe and to help prevent falls. What can I do on the outside of my home?  Regularly fix the edges of walkways and driveways and fix any cracks.  Remove anything that might make you trip as you walk through a door, such as a raised step or threshold.  Trim any bushes or trees on the path to your home.  Use bright outdoor lighting.  Clear any walking paths of anything that might make someone trip, such as rocks or tools.  Regularly check to see if handrails are loose or broken. Make sure that both sides of any steps have handrails.  Any raised decks and porches should have guardrails on the edges.  Have any leaves, snow, or ice cleared regularly.  Use sand or salt on walking paths during winter.  Clean up any spills in your garage right away. This includes oil or grease spills. What can I do in the bathroom?  Use night lights.  Install grab bars by the toilet and in the tub and shower. Do not use towel bars as grab bars.  Use non-skid mats or decals in the tub or shower.  If you need to sit down in the shower, use a plastic, non-slip stool.  Keep the floor dry. Clean up any water that spills on the floor as soon as it happens.  Remove soap buildup in the tub or shower regularly.  Attach bath mats securely with double-sided non-slip rug tape.  Do not have  throw rugs and other things on the floor that can make you trip. What can I do in the bedroom?  Use night lights.  Make sure that you have a light by your bed that is easy to reach.  Do not use any sheets or blankets that are too big for your bed. They should not hang down onto the floor.  Have a firm chair that has side arms. You can use this for support while you get dressed.  Do not have throw rugs and other things on the floor that can make you trip. What can I do in the kitchen?  Clean up any spills right away.  Avoid walking on wet floors.  Keep items that you use a lot in easy-to-reach places.  If you need to reach something above you, use a strong step stool that has a grab bar.  Keep electrical cords out of the way.  Do not use floor polish or wax that makes floors slippery. If you must use wax, use non-skid floor wax.  Do not have throw rugs and other things on the floor that can make you trip. What can I do with my stairs?  Do not leave any items on the stairs.  Make sure that there are handrails on both sides of the stairs and use them. Fix handrails that are broken or loose. Make sure that handrails are as long as the stairways.  Check any carpeting to make sure that it is firmly attached to the stairs. Fix any carpet that is loose or worn.  Avoid having throw rugs at the top or bottom of the stairs. If you do have throw rugs, attach them to the floor with carpet tape.  Make sure that you have a light switch at the top of the stairs and the bottom of the stairs. If you do not have them, ask someone to add them for you. What else can I do to help prevent falls?  Wear shoes that:  Do not have high heels.  Have rubber bottoms.  Are comfortable and fit you well.  Are closed at the toe. Do not wear sandals.  If you use a stepladder:  Make sure that it is fully opened. Do not climb a closed stepladder.  Make sure that both sides of the stepladder are  locked into place.  Ask someone to hold it for you, if possible.  Clearly mark and make sure that you can see:  Any grab bars or handrails.  First and last steps.  Where the edge of each step is.  Use tools that help you move around (mobility aids) if they are needed. These include:  Canes.  Walkers.  Scooters.  Crutches.  Turn on the lights when you go into a dark area. Replace any light bulbs as soon as they burn out.  Set up your furniture so you have a clear path. Avoid moving your furniture around.  If any of your floors are uneven, fix them.  If there are any pets around you, be aware of where they are.  Review your medicines with your doctor. Some medicines can make you feel dizzy. This can increase your chance of falling. Ask your doctor what other things that you can do to help prevent falls. This information is not intended to replace advice given to you by your health care provider. Make sure you discuss any questions you have with your health care provider. Document Released: 05/08/2009 Document Revised: 12/18/2015 Document Reviewed: 08/16/2014 Elsevier Interactive Patient Education  2017 [employer].

## 2020-01-22 NOTE — Telephone Encounter (Signed)
Noted, agree

## 2020-01-22 NOTE — Progress Notes (Signed)
Subjective:   [name] is a 74 y.o. female who presents for Medicare Annual (Subsequent) preventive examination.  Review of Systems     Cardiac Risk Factors include: advanced age (>96men, >64 women);dyslipidemia;obesity (BMI >30kg/m2);sedentary lifestyle     Objective:    Today's Vitals   01/22/20 1652  PainSc: 5    There is no height or weight on file to calculate BMI.  Advanced Directives 01/22/2020 11/23/2019 06/25/2019 02/22/2019 01/16/2019 02/02/2018 01/03/2018  Does Patient Have a Medical Advance Directive? No No No No No No No  Would patient like information on creating a medical advance directive? No - Patient declined - - No - Patient declined No - Patient declined - Yes (MAU/Ambulatory/Procedural Areas - Information given)    Current Medications (verified) Outpatient Encounter Medications as of 01/22/2020  Medication Sig  . Ascorbic Acid (VITAMIN C) 1000 MG tablet Take 1,000 mg by mouth daily.  . Calcium Citrate-Vitamin D 250-200 MG-UNIT TABS Take 500 mg by mouth daily.   . Cholecalciferol (VITAMIN D-3) 5000 units TABS Take by mouth daily.  . Cyanocobalamin 5000 MCG CAPS cyanocobalamin (vitamin B-12) 5,000 mcg capsule  Take 1 capsule every day by oral route.  . diclofenac Sodium (VOLTAREN) 1 % GEL   . fluorometholone (FML) 0.1 % ophthalmic suspension INSTILL ONE DROP IN RIGHT EYE DAILY  . HYDROcodone-acetaminophen (NORCO) 10-325 MG tablet Take 1 tablet by mouth every 6 (six) hours as needed for severe pain.  Marland Kitchen LORazepam (ATIVAN) 1 MG tablet Take 1 tablet (1 mg total) by mouth at bedtime as needed for anxiety.  . Magnesium 250 MG TABS Take 2 tablets by mouth daily.  . Multiple Vitamin (MULTIVITAMIN) capsule Take 1 capsule by mouth daily.  Marland Kitchen omeprazole (PRILOSEC) 20 MG capsule Take 20 mg by mouth every other day.   Marland Kitchen omeprazole (PRILOSEC) 40 MG capsule Take 40 mg by mouth every other day.   . predniSONE (DELTASONE) 1 MG tablet PLEASE WEAN DOWN ON YOUR PREDNISONE BY 1 MG  EVERY 4 WEEKS AS TOLERATED  . timolol (TIMOPTIC) 0.5 % ophthalmic solution Place 1 drop into the right eye 2 times daily.  . Turmeric (QC TUMERIC COMPLEX PO) Take 2,000 mg by mouth daily.   . valACYclovir (VALTREX) 1000 MG tablet Take 1,000 mg by mouth every other day.   . zinc gluconate 50 MG tablet Take by mouth.   No facility-administered encounter medications on file as of 01/22/2020.    Allergies (verified) Cymbalta [duloxetine hcl], Gabapentin, Adhesive [tape], Betimol [timolol maleate], Cosopt [dorzolamide hcl-timolol mal], Penicillins, Prednisolone acetate, Tamoxifen, and Thimerosal   History: Past Medical History:  Diagnosis Date  . Asymptomatic varicose veins   . Cancer (HCC)    breast   . Diverticulosis   . DM type 2 (diabetes mellitus, type 2) ([place]) 10/23/2014  . Dysuria   . GERD (gastroesophageal reflux disease)   . Hiatal hernia   . Hyperlipidemia   . Malignant neoplasm of breast (female), unspecified site   . Migraine without aura, without mention of intractable migraine without mention of status migrainosus   . Other abnormal blood chemistry   . Pain in joint, pelvic region and thigh   . Shingles    in eyes  . Stricture and stenosis of esophagus   . Trigger finger (acquired)   . Unspecified cataract   . Unspecified glaucoma(365.9)    Past Surgical History:  Procedure Laterality Date  . APPENDECTOMY  1973   Dr. Linzie Collin  . BREAST LUMPECTOMY Right  08/29/2008   Dr. Erroll Luna  . ESOPHAGEAL DILATION    . NASAL POLYP EXCISION  10/2015  . POLYPECTOMY  11/20/14   Uterine Polyp Removed   Family History  Problem Relation Age of Onset  . Hypertension Mother   . Hyperlipidemia Mother   . Alzheimer's disease Mother   . Emphysema Father   . COPD Father    Social History   Socioeconomic History  . Marital status: Divorced    Spouse name: Not on file  . Number of children: 1  . Years of education: Not on file  . Highest education level: Some college, no  degree  Occupational History    Comment: work from home  Tobacco Use  . Smoking status: Never Smoker  . Smokeless tobacco: Never Used  Vaping Use  . Vaping Use: Never used  Substance and Sexual Activity  . Alcohol use: No  . Drug use: No  . Sexual activity: Not Currently  Other Topics Concern  . Not on file  Social History Narrative   Lives alone   Caffeine- coffee 2 c daily   Social Determinants of Health   Financial Resource Strain:   . Difficulty of Paying Living Expenses:   Food Insecurity:   . Worried About Charity fundraiser in the Last Year:   . Arboriculturist in the Last Year:   Transportation Needs:   . Film/video editor (Medical):   Marland Kitchen Lack of Transportation (Non-Medical):   Physical Activity:   . Days of Exercise per Week:   . Minutes of Exercise per Session:   Stress:   . Feeling of Stress :   Social Connections:   . Frequency of Communication with Friends and Family:   . Frequency of Social Gatherings with Friends and Family:   . Attends Religious Services:   . Active Member of Clubs or Organizations:   . Attends Archivist Meetings:   Marland Kitchen Marital Status:     Tobacco Counseling Counseling given: Not Answered   Clinical Intake:  Pre-visit preparation completed: No  Pain : 0-10 Pain Score: 5  Pain Type: Chronic pain Pain Location: Hip Pain Orientation: Left Pain Radiating Towards: leg Pain Descriptors / Indicators: Aching, Sharp Pain Onset: Other (comment) (several years) Pain Frequency: Constant Pain Relieving Factors: Hydrocodone Effect of Pain on Daily Activities: Yes.Walking,standing,sleeping  Pain Relieving Factors: Hydrocodone  BMI - recorded: 39.75 Nutritional Status: BMI > 30  Obese Nutritional Risks: None Diabetes: No  How often do you need to have someone help you when you read instructions, pamphlets, or other written materials from your doctor or pharmacy?: 1 - Never What is the last grade level you completed  in school?: [place] 2 years  Diabetic?no  Interpreter Needed?: No  Information entered by :: Greenbriar Shalom Mcguiness FNP-C   Activities of Daily Living In your present state of health, do you have any difficulty performing the following activities: 01/22/2020  Hearing? N  Vision? N  Difficulty concentrating or making decisions? N  Walking or climbing stairs? Y  Comment hip pain  Dressing or bathing? N  Doing errands, shopping? Y  Comment ordering online with home deliver  Preparing Food and eating ? Y  Comment son assit  Using the Toilet? N  In the past six months, have you accidently leaked urine? N  Do you have problems with loss of bowel control? N  Managing your Medications? N  Managing your Finances? N  Housekeeping or managing your Housekeeping? Darreld Mclean  Comment son assist  Some recent data might be hidden    Patient Care Team: Gayland Curry, DO as PCP - General (Geriatric Medicine) Magrinat, Virgie Dad, MD as Consulting Physician (Oncology) Erroll Luna, MD as Consulting Physician (General Surgery) Harvie Heck, MD as Physician Assistant (Internal Medicine) Chyrel Masson, DO as Anesthesiologist (Otolaryngology) Clinger, Chrystie Nose, MD (Otolaryngology) Turner Daniels, MD as Referring Physician (Internal Medicine)  Indicate any recent Medical Services you may have received from other than [facility] providers in the past year (date may be approximate).     Assessment:   This is a routine wellness examination for Annikah.  Hearing/Vision screen  Hearing Screening   125Hz  250Hz  500Hz  1000Hz  2000Hz  3000Hz  4000Hz  6000Hz  8000Hz   Right ear:           Left ear:           Comments: No Hearing Concerns.   Vision Screening Comments: No Vision Concerns. Patient wears reading glasses.  Dietary issues and exercise activities discussed: Current Exercise Habits: The patient does not participate in regular exercise at present, Exercise limited by: Other - see comments (hip pain)  Goals    .  Exercise 3x per week (30 min per time)     Starting 10/29/2016 I would like to start walking 3 days a week for 1-2 miles.      Depression Screen PHQ 2/9 Scores 01/22/2020 02/12/2019 01/24/2019 01/16/2019 10/09/2018 06/05/2018 04/07/2018  PHQ - 2 Score 0 0 0 0 0 0 0    Fall Risk Fall Risk  01/22/2020 11/23/2019 06/25/2019 05/28/2019 02/12/2019  Falls in the past year? 1 1 0 0 0  Number falls in past yr: 0 0 - 0 0  Injury with Fall? 1 1 0 - 0    Any stairs in or around the home? No  If so, are there any without handrails? No  Home free of loose throw rugs in walkways, pet beds, electrical cords, etc? Yes  has dog  Adequate lighting in your home to reduce risk of falls? Yes   ASSISTIVE DEVICES UTILIZED TO PREVENT FALLS:  Life alert? Yes  Use of a cane, walker or w/c? Yes  Grab bars in the bathroom? Yes  Shower chair or bench in shower? Yes  Elevated toilet seat or a handicapped toilet? Yes   TIMED UP AND GO:  Was the test performed? No .  Length of time to ambulate 10 feet: N/A  sec.    Cognitive Function: MMSE - Mini Mental State Exam 01/03/2018 10/29/2016 04/24/2014  Orientation to time 5 5 5   Orientation to Place 5 5 5   Registration 3 3 3   Attention/ Calculation 5 5 5   Recall 1 3 2   Language- name 2 objects 2 2 2   Language- repeat 1 1 1   Language- follow 3 step command 3 3 3   Language- read & follow direction 1 1 1   Write a sentence 1 1 1   Copy design 1 1 1   Total score 28 30 29      6CIT Screen 01/22/2020 01/16/2019  What Year? 0 points 0 points  What month? 0 points 0 points  What time? 0 points 0 points  Count back from 20 0 points 0 points  Months in reverse 0 points 0 points  Repeat phrase 0 points 0 points  Total Score 0 0    Immunizations Immunization History  Administered Date(s) Administered  . Influenza Whole 07/26/2010  . Influenza, High Dose Seasonal PF 05/03/2019  . Influenza,inj,Quad  PF,6+ Mos 04/24/2014, 04/01/2015, 05/05/2016  . Influenza-Unspecified  06/30/2010, 04/25/2013, 05/31/2018  . Pneumococcal Conjugate-13 10/23/2014  . Pneumococcal Polysaccharide-23 10/29/2016  . Td 06/24/2009  . Tdap 07/26/2008  . Zoster 10/17/2013    TDAP status: Due, Education has been provided regarding the importance of this vaccine. Advised may receive this vaccine at local pharmacy or Health Dept. Aware to provide a copy of the vaccination record if obtained from local pharmacy or Health Dept. Verbalized acceptance and understanding. Flu Vaccine status: Up to date Pneumococcal vaccine status: Up to date Covid-19 vaccine status: Declined, Education has been provided regarding the importance of this vaccine but patient still declined. Advised may receive this vaccine at local pharmacy or Health Dept.or vaccine clinic. Aware to provide a copy of the vaccination record if obtained from local pharmacy or Health Dept. Verbalized acceptance and understanding.  Qualifies for Shingles Vaccine? No   Zostavax completed No   Shingrix Completed?: No.    Education has been provided regarding the importance of this vaccine. Patient has been advised to call insurance company to determine out of pocket expense if they have not yet received this vaccine. Advised may also receive vaccine at local pharmacy or Health Dept. Verbalized acceptance and understanding.  Screening Tests Health Maintenance  Topic Date Due  . Hepatitis C Screening  Never done  . COVID-19 Vaccine (1) Never done  . COLONOSCOPY  03/26/2019  . TETANUS/TDAP  06/25/2019  . URINE MICROALBUMIN  10/09/2019  . INFLUENZA VACCINE  02/24/2020  . MAMMOGRAM  04/14/2020  . DEXA SCAN  Completed  . PNA vac Low Risk Adult  Completed    Health Maintenance  Health Maintenance Due  Topic Date Due  . Hepatitis C Screening  Never done  . COVID-19 Vaccine (1) Never done  . COLONOSCOPY  03/26/2019  . TETANUS/TDAP  06/25/2019  . URINE MICROALBUMIN  10/09/2019    Colorectal cancer screening: No longer required.    Mammogram status: Completed 04/14/2020 . Repeat every year Bone Density status: Completed 11/12/2016. Results reflect: Bone density results: NORMAL. Repeat every 2 years.  Lung Cancer Screening: (Low Dose CT Chest recommended if Age 37-80 years, 30 pack-year currently smoking OR have quit w/in 15years.) does not qualify.   Lung Cancer Screening Referral: No   Additional Screening:  Hepatitis C Screening: does not qualify; Completed No   Vision Screening: Recommended annual ophthalmology exams for early detection of glaucoma and other disorders of the eye. Is the patient up to date with their annual eye exam?  Yes  Who is the provider or what is the name of the office in which the patient attends annual eye exams? Yes  If pt is not established with a provider, would they like to be referred to a provider to establish care? Yes .   Dental Screening: Recommended annual dental exams for proper oral hygiene  Community Resource Referral / Chronic Care Management: CRR required this visit?  No   CCM required this visit?  No      Plan:   - Post pone on mammogram,Tdap,COVID-19,Hep C,MicroAlbumin  - Request Cologuard   I have personally reviewed and noted the following in the patient's chart:   . Medical and social history . Use of alcohol, tobacco or illicit drugs  . Current medications and supplements . Functional ability and status . Nutritional status . Physical activity . Advanced directives . List of other physicians . Hospitalizations, surgeries, and ER visits in previous 12 months . Vitals . Screenings to  include cognitive, depression, and falls . Referrals and appointments  In addition, I have reviewed and discussed with patient certain preventive protocols, quality metrics, and best practice recommendations. A written personalized care plan for preventive services as well as general preventive health recommendations were provided to patient.     Sandrea Hughs,  NP   01/22/2020   Nurse Notes:Request Cologuard

## 2020-01-23 ENCOUNTER — Other Ambulatory Visit: Payer: Self-pay | Admitting: Family

## 2020-01-23 DIAGNOSIS — Z471 Aftercare following joint replacement surgery: Secondary | ICD-10-CM | POA: Insufficient documentation

## 2020-01-23 DIAGNOSIS — Z79891 Long term (current) use of opiate analgesic: Secondary | ICD-10-CM | POA: Diagnosis not present

## 2020-01-23 DIAGNOSIS — M5431 Sciatica, right side: Secondary | ICD-10-CM

## 2020-01-23 DIAGNOSIS — Z96641 Presence of right artificial hip joint: Secondary | ICD-10-CM | POA: Diagnosis not present

## 2020-01-23 DIAGNOSIS — M353 Polymyalgia rheumatica: Secondary | ICD-10-CM | POA: Diagnosis not present

## 2020-01-23 DIAGNOSIS — Z7982 Long term (current) use of aspirin: Secondary | ICD-10-CM | POA: Diagnosis not present

## 2020-01-23 DIAGNOSIS — M1612 Unilateral primary osteoarthritis, left hip: Secondary | ICD-10-CM | POA: Diagnosis not present

## 2020-01-23 MED ORDER — HYDROCODONE-ACETAMINOPHEN 10-325 MG PO TABS: 1.0000 | ORAL_TABLET | Freq: Four times a day (QID) | ORAL | 0 refills | Status: DC | PRN

## 2020-01-25 DIAGNOSIS — Z7982 Long term (current) use of aspirin: Secondary | ICD-10-CM | POA: Diagnosis not present

## 2020-01-25 DIAGNOSIS — Z79891 Long term (current) use of opiate analgesic: Secondary | ICD-10-CM | POA: Diagnosis not present

## 2020-01-25 DIAGNOSIS — M1612 Unilateral primary osteoarthritis, left hip: Secondary | ICD-10-CM | POA: Diagnosis not present

## 2020-01-25 DIAGNOSIS — Z96641 Presence of right artificial hip joint: Secondary | ICD-10-CM | POA: Diagnosis not present

## 2020-01-25 DIAGNOSIS — Z471 Aftercare following joint replacement surgery: Secondary | ICD-10-CM | POA: Diagnosis not present

## 2020-01-25 DIAGNOSIS — M353 Polymyalgia rheumatica: Secondary | ICD-10-CM | POA: Diagnosis not present

## 2020-02-01 DIAGNOSIS — Z96641 Presence of right artificial hip joint: Secondary | ICD-10-CM | POA: Diagnosis not present

## 2020-02-01 DIAGNOSIS — Z79891 Long term (current) use of opiate analgesic: Secondary | ICD-10-CM | POA: Diagnosis not present

## 2020-02-01 DIAGNOSIS — M1612 Unilateral primary osteoarthritis, left hip: Secondary | ICD-10-CM | POA: Diagnosis not present

## 2020-02-01 DIAGNOSIS — M353 Polymyalgia rheumatica: Secondary | ICD-10-CM | POA: Diagnosis not present

## 2020-02-01 DIAGNOSIS — Z471 Aftercare following joint replacement surgery: Secondary | ICD-10-CM | POA: Diagnosis not present

## 2020-02-01 DIAGNOSIS — Z7982 Long term (current) use of aspirin: Secondary | ICD-10-CM | POA: Diagnosis not present

## 2020-02-20 ENCOUNTER — Other Ambulatory Visit: Payer: Self-pay | Admitting: *Deleted

## 2020-02-20 DIAGNOSIS — M5431 Sciatica, right side: Secondary | ICD-10-CM

## 2020-02-20 MED ORDER — HYDROCODONE-ACETAMINOPHEN 10-325 MG PO TABS: 1.0000 | ORAL_TABLET | Freq: Four times a day (QID) | ORAL | 0 refills | Status: DC | PRN

## 2020-02-20 NOTE — Telephone Encounter (Signed)
Patient requested refill Epic LR: 01/23/2020 Narcotic Contract updated Pended Rx and sent to Dr. Mariea Clonts for approval.

## 2020-02-28 ENCOUNTER — Encounter: Payer: Self-pay | Admitting: Internal Medicine

## 2020-02-28 DIAGNOSIS — Z1212 Encounter for screening for malignant neoplasm of rectum: Secondary | ICD-10-CM | POA: Diagnosis not present

## 2020-02-28 DIAGNOSIS — Z1211 Encounter for screening for malignant neoplasm of colon: Secondary | ICD-10-CM | POA: Diagnosis not present

## 2020-02-28 LAB — COLOGUARD: Cologuard: NEGATIVE

## 2020-03-06 LAB — COLOGUARD: COLOGUARD: NEGATIVE

## 2020-03-06 LAB — EXTERNAL GENERIC LAB PROCEDURE: COLOGUARD: NEGATIVE

## 2020-03-07 DIAGNOSIS — Z853 Personal history of malignant neoplasm of breast: Secondary | ICD-10-CM | POA: Diagnosis not present

## 2020-03-07 DIAGNOSIS — C50911 Malignant neoplasm of unspecified site of right female breast: Secondary | ICD-10-CM | POA: Diagnosis not present

## 2020-03-07 DIAGNOSIS — Z170 Estrogen receptor positive status [ER+]: Secondary | ICD-10-CM | POA: Diagnosis not present

## 2020-03-07 DIAGNOSIS — C4361 Malignant melanoma of right upper limb, including shoulder: Secondary | ICD-10-CM | POA: Diagnosis not present

## 2020-03-07 DIAGNOSIS — C7989 Secondary malignant neoplasm of other specified sites: Secondary | ICD-10-CM | POA: Diagnosis not present

## 2020-03-07 DIAGNOSIS — Z85820 Personal history of malignant melanoma of skin: Secondary | ICD-10-CM | POA: Diagnosis not present

## 2020-03-07 DIAGNOSIS — C50511 Malignant neoplasm of lower-outer quadrant of right female breast: Secondary | ICD-10-CM | POA: Diagnosis not present

## 2020-03-07 DIAGNOSIS — Z08 Encounter for follow-up examination after completed treatment for malignant neoplasm: Secondary | ICD-10-CM | POA: Diagnosis not present

## 2020-03-07 DIAGNOSIS — C799 Secondary malignant neoplasm of unspecified site: Secondary | ICD-10-CM | POA: Diagnosis not present

## 2020-03-07 DIAGNOSIS — Z9221 Personal history of antineoplastic chemotherapy: Secondary | ICD-10-CM | POA: Diagnosis not present

## 2020-03-07 DIAGNOSIS — Z9289 Personal history of other medical treatment: Secondary | ICD-10-CM | POA: Diagnosis not present

## 2020-03-07 DIAGNOSIS — C50411 Malignant neoplasm of upper-outer quadrant of right female breast: Secondary | ICD-10-CM | POA: Diagnosis not present

## 2020-03-13 ENCOUNTER — Ambulatory Visit (INDEPENDENT_AMBULATORY_CARE_PROVIDER_SITE_OTHER): Payer: Medicare Other | Admitting: Internal Medicine

## 2020-03-13 ENCOUNTER — Encounter: Payer: Self-pay | Admitting: Internal Medicine

## 2020-03-13 ENCOUNTER — Other Ambulatory Visit: Payer: Self-pay

## 2020-03-13 VITALS — BP 128/72 | HR 84 | Temp 97.9°F | Ht 64.0 in | Wt 248.1 lb

## 2020-03-13 DIAGNOSIS — E559 Vitamin D deficiency, unspecified: Secondary | ICD-10-CM

## 2020-03-13 DIAGNOSIS — Z01818 Encounter for other preprocedural examination: Secondary | ICD-10-CM

## 2020-03-13 DIAGNOSIS — M1612 Unilateral primary osteoarthritis, left hip: Secondary | ICD-10-CM

## 2020-03-13 DIAGNOSIS — M353 Polymyalgia rheumatica: Secondary | ICD-10-CM | POA: Diagnosis not present

## 2020-03-13 DIAGNOSIS — R739 Hyperglycemia, unspecified: Secondary | ICD-10-CM | POA: Diagnosis not present

## 2020-03-13 NOTE — Progress Notes (Signed)
Location:  [facility] clinic Provider:  Kaylub Detienne L. Mariea Clonts, D.O., C.M.D.  Code Status: full code Goals of Care:  Advanced Directives 03/13/2020  Does Patient Have a Medical Advance Directive? No  Would patient like information on creating a medical advance directive? No - Patient declined   Chief Complaint  Patient presents with  . Acute Visit    Clearance for 2nd knee surgery     HPI: Patient is a 74 y.o. 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   First hip surgery went well.  Says she was not aware of how bad that one hurt b/c of how bad the other one hurt.  She's not incontinent anymore after the surgery.  Has made it every time since.  No chest pain, sob.    She just saw Dr. Kendall Flack Friday.  She had labs and a chest xray.  She will then have an MRI around Dec (maybe later with her mobility after her hip).  She's to where she can only walk with a walker now.  She's also amazed at how quickly her hip joints deteriorated.  She has gained 17 lbs since the first hip was done.    She did her cologuard.    Left foot had lit up with PET scan.  MRI didn't show anything.  Dr. Kendall Flack had thought it was a false positive.  She's felt like a vein broke in her left foot.  The left foot is darker and reddish.  She now has a circle near the bottom of her shin about an inch round.  Discussed venous insufficiency.  She needs her toenails cut b/w her hip surgeries.    She is going to go get her covid vaccines after her surgery.  Past Medical History:  Diagnosis Date  . Asymptomatic varicose veins   . Cancer (HCC)    breast   . Diverticulosis   . DM type 2 (diabetes mellitus, type 2) ([place]) 10/23/2014  . Dysuria   . GERD (gastroesophageal reflux disease)   . Hiatal hernia   . Hyperlipidemia   . Malignant neoplasm of breast (female),  unspecified site   . Migraine without aura, without mention of intractable migraine without mention of status migrainosus   . Other abnormal blood chemistry   . Pain in joint, pelvic region and thigh   . Shingles    in eyes  . Stricture and stenosis of esophagus   . Trigger finger (acquired)   . Unspecified cataract   . Unspecified glaucoma(365.9)     Past Surgical History:  Procedure Laterality Date  . APPENDECTOMY  1973   Dr. Linzie Collin  . BREAST LUMPECTOMY Right 08/29/2008   Dr. Erroll Luna  . ESOPHAGEAL DILATION    . NASAL POLYP EXCISION  10/2015  . POLYPECTOMY  11/20/14   Uterine Polyp Removed    Allergies  Allergen Reactions  . Cymbalta [Duloxetine Hcl] Other (See Comments)    sleepiness  . Gabapentin Other (See Comments)    sleepiness  . Adhesive [Tape] Dermatitis  . Betimol [Timolol Maleate]     Allergic to preservatives   . Cosopt [Dorzolamide Hcl-Timolol Mal] Other (See Comments)    Turns eye red, increases eye pressure  . Penicillins   . Prednisolone Acetate Other (See Comments)    Caused increased eye pressure  . Tamoxifen Other (See Comments)  Vaginal bleeding  . Thimerosal     Makes pt eye red     Outpatient Encounter Medications as of 03/13/2020  Medication Sig  . Ascorbic Acid (VITAMIN C) 1000 MG tablet Take 1,000 mg by mouth daily.  . Calcium Citrate-Vitamin D 250-200 MG-UNIT TABS Take 500 mg by mouth daily.   . Cholecalciferol (VITAMIN D-3) 5000 units TABS Take by mouth daily.  . Cyanocobalamin 5000 MCG CAPS cyanocobalamin (vitamin B-12) 5,000 mcg capsule  Take 1 capsule every day by oral route.  . diclofenac (VOLTAREN) 75 MG EC tablet Take 75 mg by mouth 2 (two) times daily.  . diclofenac Sodium (VOLTAREN) 1 % GEL   . fluorometholone (FML) 0.1 % ophthalmic suspension INSTILL ONE DROP IN RIGHT EYE DAILY  . HYDROcodone-acetaminophen (NORCO) 10-325 MG tablet Take 1 tablet by mouth every 6 (six) hours as needed for severe pain.  Marland Kitchen LORazepam  (ATIVAN) 1 MG tablet Take 1 tablet (1 mg total) by mouth at bedtime as needed for anxiety.  . Multiple Vitamin (MULTIVITAMIN) capsule Take 1 capsule by mouth daily.  Marland Kitchen omeprazole (PRILOSEC) 20 MG capsule Take 20 mg by mouth every other day.   Marland Kitchen omeprazole (PRILOSEC) 40 MG capsule Take 40 mg by mouth every other day.   . predniSONE (DELTASONE) 1 MG tablet PLEASE WEAN DOWN ON YOUR PREDNISONE BY 1 MG EVERY 4 WEEKS AS TOLERATED  . timolol (TIMOPTIC) 0.5 % ophthalmic solution Place 1 drop into the right eye 2 times daily.  . valACYclovir (VALTREX) 1000 MG tablet Take 1,000 mg by mouth every other day.   . zinc gluconate 50 MG tablet Take by mouth.  . [DISCONTINUED] Magnesium 250 MG TABS Take 2 tablets by mouth daily.  . [DISCONTINUED] Turmeric (QC TUMERIC COMPLEX PO) Take 2,000 mg by mouth daily.    No facility-administered encounter medications on file as of 03/13/2020.    Review of Systems:  Review of Systems  Constitutional: Negative for chills, fever and malaise/fatigue.  HENT: Negative for congestion.   Eyes: Negative for blurred vision.  Respiratory: Negative for cough and shortness of breath.   Cardiovascular: Positive for leg swelling. Negative for chest pain and palpitations.  Gastrointestinal: Negative for abdominal pain, blood in stool, constipation, diarrhea and melena.  Genitourinary: Negative for dysuria.  Musculoskeletal: Positive for back pain and joint pain. Negative for falls.  Skin:       Red area on anterior left shin  Neurological: Negative for dizziness and loss of consciousness.  Psychiatric/Behavioral: Negative for memory loss. The patient is nervous/anxious.     Health Maintenance  Topic Date Due  . COLONOSCOPY  03/26/2019  . INFLUENZA VACCINE  02/24/2020  . URINE MICROALBUMIN  03/24/2020 (Originally 10/09/2019)  . TETANUS/TDAP  03/24/2020 (Originally 06/25/2019)  . Hepatitis C Screening  03/24/2020 (Originally 1946/06/27)  . COVID-19 Vaccine (1) 03/25/2020  (Originally 07/02/1958)  . MAMMOGRAM  04/14/2020  . DEXA SCAN  Completed  . PNA vac Low Risk Adult  Completed    Physical Exam: Vitals:   03/13/20 1306  BP: 128/72  Pulse: 95  Temp: 97.9 F (36.6 C)  TempSrc: Temporal  SpO2: (!) 84%  Weight: 248 lb 1.6 oz (112.5 kg)  Height: 5\' 4"  (1.626 m)   Body mass index is 42.59 kg/m. Physical Exam Vitals reviewed.  Constitutional:      General: She is not in acute distress.    Appearance: Normal appearance. She is obese. She is not ill-appearing or toxic-appearing.  HENT:  Head: Normocephalic and atraumatic.  Eyes:     Extraocular Movements: Extraocular movements intact.     Pupils: Pupils are equal, round, and reactive to light.  Cardiovascular:     Rate and Rhythm: Normal rate and regular rhythm.     Pulses: Normal pulses.     Heart sounds: Normal heart sounds.  Pulmonary:     Effort: Pulmonary effort is normal.     Breath sounds: Normal breath sounds. No wheezing, rhonchi or rales.  Abdominal:     General: Bowel sounds are normal.     Palpations: Abdomen is soft.     Tenderness: There is no abdominal tenderness.  Musculoskeletal:     Cervical back: Neck supple.     Right lower leg: Edema present.     Left lower leg: Edema present.     Comments: Ambulates with rolling walker slowly; nonpitting edema of both lower legs and feet  Skin:    Capillary Refill: Capillary refill takes less than 2 seconds.     Comments: Small red area less than one inch on left anterior shin surrounded by spider varicose veins and capillaries, mildly tender  Neurological:     General: No focal deficit present.     Mental Status: She is alert and oriented to person, place, and time.  Psychiatric:        Mood and Affect: Mood normal.        Behavior: Behavior normal.     Labs reviewed: Basic Metabolic Panel: Recent Labs    11/23/19 1424  NA 139  K 4.1  CL 102  CO2 27  GLUCOSE 112*  BUN 18  CREATININE 0.61  CALCIUM 9.9   Liver  Function Tests: Recent Labs    11/23/19 1424  AST 14  ALT 13  BILITOT 0.5  PROT 6.5   No results for input(s): LIPASE, AMYLASE in the last 8760 hours. No results for input(s): AMMONIA in the last 8760 hours. CBC: Recent Labs    11/23/19 1424  WBC 6.7  NEUTROABS 4,241  HGB 12.9  HCT 37.8  MCV 98.2  PLT 258   Lipid Panel: Recent Labs    11/23/19 1424  CHOL 227*  HDL 63  LDLCALC 133*  TRIG 171*  CHOLHDL 3.6   Lab Results  Component Value Date   HGBA1C 5.7 (H) 11/23/2019    Procedures since last visit: No results found.  Assessment/Plan 1. Primary osteoarthritis of left hip - is for left hip replacement - she is eager for pain control, right hip is much better - still uses hydrocodone for pain now for left hip   2. Preop testing - these were done today: - CBC with Differential/Platelet - Basic metabolic panel - Hemoglobin A1c - VITAMIN D 25 Hydroxy (Vit-D Deficiency, Fractures) - Urinalysis, Routine w reflex microscopic - had benign EKG in April, no new symptoms or changes  - she is low risk for her surgery outside of her obesity and steroid use  3. Vitamin D deficiency - f/u lab - VITAMIN D 25 Hydroxy (Vit-D Deficiency, Fractures)  4. Hyperglycemia - f/u lab - Hemoglobin A1c  5. Polymyalgia rheumatica (HCC) -remains on some prednisone  Labs/tests ordered:   Lab Orders     MICROSCOPIC MESSAGE     CBC with Differential/Platelet     Basic metabolic panel     Hemoglobin A1c     VITAMIN D 25 Hydroxy (Vit-D Deficiency, Fractures)     Urinalysis, Routine w reflex microscopic   Next  appt:  08/14/2020   Malan Werk L. Maralyn Witherell, D.O. [facility] [address], [place] [address] Cell Phone (Mon-Fri 8am-5pm):  [phone] On Call:  [phone] & follow prompts after 5pm & weekends Office Phone:  [phone] Office Fax:  [phone]

## 2020-03-14 LAB — BASIC METABOLIC PANEL
BUN: 19 mg/dL (ref 7–25)
CO2: 25 mmol/L (ref 20–32)
Calcium: 10 mg/dL (ref 8.6–10.4)
Chloride: 101 mmol/L (ref 98–110)
Creat: 0.63 mg/dL (ref 0.60–0.93)
Glucose, Bld: 116 mg/dL (ref 65–139)
Potassium: 4.5 mmol/L (ref 3.5–5.3)
Sodium: 138 mmol/L (ref 135–146)

## 2020-03-14 LAB — HEMOGLOBIN A1C
Hgb A1c MFr Bld: 5.9 % of total Hgb — ABNORMAL HIGH (ref <5.7)
Mean Plasma Glucose: 123 (calc)
eAG (mmol/L): 6.8 (calc)

## 2020-03-14 LAB — CBC WITH DIFFERENTIAL/PLATELET
Absolute Monocytes: 396 cells/uL (ref 200–950)
Basophils Absolute: 69 cells/uL (ref 0–200)
Basophils Relative: 0.8 %
Eosinophils Absolute: 129 cells/uL (ref 15–500)
Eosinophils Relative: 1.5 %
HCT: 38.7 % (ref 35.0–45.0)
Hemoglobin: 12.8 g/dL (ref 11.7–15.5)
Lymphs Abs: 843 cells/uL — ABNORMAL LOW (ref 850–3900)
MCH: 31 pg (ref 27.0–33.0)
MCHC: 33.1 g/dL (ref 32.0–36.0)
MCV: 93.7 fL (ref 80.0–100.0)
MPV: 10.5 fL (ref 7.5–12.5)
Monocytes Relative: 4.6 %
Neutro Abs: 7164 cells/uL (ref 1500–7800)
Neutrophils Relative %: 83.3 %
Platelets: 271 10*3/uL (ref 140–400)
RBC: 4.13 10*6/uL (ref 3.80–5.10)
RDW: 11.9 % (ref 11.0–15.0)
Total Lymphocyte: 9.8 %
WBC: 8.6 10*3/uL (ref 3.8–10.8)

## 2020-03-14 LAB — URINALYSIS, ROUTINE W REFLEX MICROSCOPIC
Bacteria, UA: NONE SEEN /HPF
Bilirubin Urine: NEGATIVE
Glucose, UA: NEGATIVE
Hgb urine dipstick: NEGATIVE
Hyaline Cast: NONE SEEN /LPF
Ketones, ur: NEGATIVE
Nitrite: NEGATIVE
Protein, ur: NEGATIVE
RBC / HPF: NONE SEEN /HPF (ref 0–2)
Specific Gravity, Urine: 1.008 (ref 1.001–1.03)
Squamous Epithelial / HPF: NONE SEEN /HPF (ref <=5)
pH: 6 (ref 5.0–8.0)

## 2020-03-14 LAB — VITAMIN D 25 HYDROXY (VIT D DEFICIENCY, FRACTURES): Vit D, 25-Hydroxy: 38 ng/mL (ref 30–100)

## 2020-03-14 NOTE — Progress Notes (Signed)
I had asked [name] to call [facility] to get her an expedited appt due to her unusual circumstances of needing her toenails trimmed b/w her hip surgeries.  I guess she did not get to that yesterday.  Can you please take care of the call?  Maybe one of their other locations could see her if the [place] one is booked up?

## 2020-03-14 NOTE — Progress Notes (Signed)
Blood counts were ok Kidney function and electrolytes were ok Sugar average has trended up from 5.7 to 5.9 (prediabetic) Vitamin D level is low at 38.  Ideally it should be 40-80 in older adults.  I recommend she get back on her D3 and calcium with D after her surgery (I know they'll have her hold her supplements prior to it)

## 2020-03-21 ENCOUNTER — Other Ambulatory Visit: Payer: Self-pay | Admitting: *Deleted

## 2020-03-21 DIAGNOSIS — M5431 Sciatica, right side: Secondary | ICD-10-CM

## 2020-03-21 MED ORDER — HYDROCODONE-ACETAMINOPHEN 10-325 MG PO TABS: 1.0000 | ORAL_TABLET | Freq: Four times a day (QID) | ORAL | 0 refills | Status: DC | PRN

## 2020-03-21 NOTE — Telephone Encounter (Signed)
Patient requested refill Epic LR: 02/20/20 Contract on file Pended Rx and sent to Dr. Mariea Clonts for approval.

## 2020-03-24 ENCOUNTER — Ambulatory Visit (INDEPENDENT_AMBULATORY_CARE_PROVIDER_SITE_OTHER): Payer: Medicare Other | Admitting: Podiatry

## 2020-03-24 ENCOUNTER — Other Ambulatory Visit: Payer: Self-pay

## 2020-03-24 ENCOUNTER — Encounter: Payer: Self-pay | Admitting: Podiatry

## 2020-03-24 DIAGNOSIS — B351 Tinea unguium: Secondary | ICD-10-CM

## 2020-03-24 DIAGNOSIS — M79674 Pain in right toe(s): Secondary | ICD-10-CM | POA: Diagnosis not present

## 2020-03-24 DIAGNOSIS — E1142 Type 2 diabetes mellitus with diabetic polyneuropathy: Secondary | ICD-10-CM | POA: Diagnosis not present

## 2020-03-24 DIAGNOSIS — M79675 Pain in left toe(s): Secondary | ICD-10-CM | POA: Diagnosis not present

## 2020-03-24 NOTE — Progress Notes (Signed)
Complaint:  Visit Type: Patient returns to my office for continued preventative foot care services. Complaint: Patient states" my third toenails both feet  have grown long and thick and become painful to walk and wear shoes"  The patient presents for preventative foot care services. No changes to ROS  Podiatric Exam: Vascular: dorsalis pedis and posterior tibial pulses are palpable bilateral. Capillary return is immediate. Temperature gradient is WNL. Skin turgor WNL  Sensorium: Normal Semmes Weinstein monofilament test. Normal tactile sensation bilaterally. Nail Exam: Pt has thick disfigured discolored nails with subungual debris noted third toenails both feet. Ulcer Exam: There is no evidence of ulcer or pre-ulcerative changes or infection. Orthopedic Exam: Muscle tone and strength are WNL. No limitations in general ROM. No crepitus or effusions noted. Foot type and digits show no abnormalities. Bony prominences are unremarkable. Skin: No Porokeratosis. No infection or ulcers  Diagnosis:  Onychomycosis, , Pain in right toe, pain in left toes  Treatment & Plan Procedures and Treatment: Consent by patient was obtained for treatment procedures.   Debridement of mycotic and hypertrophic toenails, 1 through 5 bilateral and clearing of subungual debris. No ulceration, no infection noted.  Patient is having foot problems and she is under the care of her medical doctors. Return Visit-Office Procedure: Patient instructed to return to the office for a follow up visit 3 months for continued evaluation and treatment.    Gardiner Barefoot DPM

## 2020-03-28 DIAGNOSIS — Z01810 Encounter for preprocedural cardiovascular examination: Secondary | ICD-10-CM | POA: Diagnosis not present

## 2020-03-28 DIAGNOSIS — Z01812 Encounter for preprocedural laboratory examination: Secondary | ICD-10-CM | POA: Diagnosis not present

## 2020-03-28 DIAGNOSIS — M1612 Unilateral primary osteoarthritis, left hip: Secondary | ICD-10-CM | POA: Diagnosis not present

## 2020-03-28 DIAGNOSIS — I517 Cardiomegaly: Secondary | ICD-10-CM | POA: Diagnosis not present

## 2020-04-04 ENCOUNTER — Ambulatory Visit: Payer: Medicare Other | Admitting: Podiatry

## 2020-04-21 ENCOUNTER — Other Ambulatory Visit: Payer: Self-pay | Admitting: *Deleted

## 2020-04-21 DIAGNOSIS — M5431 Sciatica, right side: Secondary | ICD-10-CM

## 2020-04-21 MED ORDER — HYDROCODONE-ACETAMINOPHEN 10-325 MG PO TABS: 1.0000 | ORAL_TABLET | Freq: Four times a day (QID) | ORAL | 0 refills | Status: DC | PRN

## 2020-04-21 NOTE — Telephone Encounter (Signed)
Patient requested refill Epic LR: 03/21/2020 Contract on File Pended Rx and sent to Dr. Mariea Clonts for approval.

## 2020-05-06 DIAGNOSIS — H3581 Retinal edema: Secondary | ICD-10-CM | POA: Diagnosis not present

## 2020-05-06 DIAGNOSIS — B0051 Herpesviral iridocyclitis: Secondary | ICD-10-CM | POA: Diagnosis not present

## 2020-05-06 DIAGNOSIS — Z961 Presence of intraocular lens: Secondary | ICD-10-CM | POA: Diagnosis not present

## 2020-05-06 DIAGNOSIS — D3131 Benign neoplasm of right choroid: Secondary | ICD-10-CM | POA: Diagnosis not present

## 2020-05-06 DIAGNOSIS — H25812 Combined forms of age-related cataract, left eye: Secondary | ICD-10-CM | POA: Diagnosis not present

## 2020-05-09 DIAGNOSIS — Z20822 Contact with and (suspected) exposure to covid-19: Secondary | ICD-10-CM | POA: Diagnosis not present

## 2020-05-09 DIAGNOSIS — M1612 Unilateral primary osteoarthritis, left hip: Secondary | ICD-10-CM | POA: Diagnosis not present

## 2020-05-09 DIAGNOSIS — Z01812 Encounter for preprocedural laboratory examination: Secondary | ICD-10-CM | POA: Diagnosis not present

## 2020-05-14 DIAGNOSIS — M1612 Unilateral primary osteoarthritis, left hip: Secondary | ICD-10-CM | POA: Diagnosis not present

## 2020-05-14 DIAGNOSIS — Z96641 Presence of right artificial hip joint: Secondary | ICD-10-CM | POA: Diagnosis not present

## 2020-05-14 DIAGNOSIS — E119 Type 2 diabetes mellitus without complications: Secondary | ICD-10-CM | POA: Diagnosis not present

## 2020-05-15 DIAGNOSIS — M1612 Unilateral primary osteoarthritis, left hip: Secondary | ICD-10-CM | POA: Diagnosis not present

## 2020-05-15 DIAGNOSIS — E119 Type 2 diabetes mellitus without complications: Secondary | ICD-10-CM | POA: Diagnosis not present

## 2020-05-15 DIAGNOSIS — Z96641 Presence of right artificial hip joint: Secondary | ICD-10-CM | POA: Diagnosis not present

## 2020-05-16 DIAGNOSIS — Z96641 Presence of right artificial hip joint: Secondary | ICD-10-CM | POA: Diagnosis not present

## 2020-05-16 DIAGNOSIS — M1612 Unilateral primary osteoarthritis, left hip: Secondary | ICD-10-CM | POA: Diagnosis not present

## 2020-05-16 DIAGNOSIS — E119 Type 2 diabetes mellitus without complications: Secondary | ICD-10-CM | POA: Diagnosis not present

## 2020-05-17 DIAGNOSIS — E000 Congenital iodine-deficiency syndrome, neurological type: Secondary | ICD-10-CM | POA: Diagnosis not present

## 2020-05-17 DIAGNOSIS — M1612 Unilateral primary osteoarthritis, left hip: Secondary | ICD-10-CM | POA: Diagnosis not present

## 2020-05-17 DIAGNOSIS — Z96642 Presence of left artificial hip joint: Secondary | ICD-10-CM | POA: Diagnosis not present

## 2020-05-17 DIAGNOSIS — K219 Gastro-esophageal reflux disease without esophagitis: Secondary | ICD-10-CM | POA: Diagnosis not present

## 2020-05-17 DIAGNOSIS — Z471 Aftercare following joint replacement surgery: Secondary | ICD-10-CM | POA: Diagnosis not present

## 2020-05-17 DIAGNOSIS — H401111 Primary open-angle glaucoma, right eye, mild stage: Secondary | ICD-10-CM | POA: Diagnosis not present

## 2020-05-20 DIAGNOSIS — K219 Gastro-esophageal reflux disease without esophagitis: Secondary | ICD-10-CM | POA: Diagnosis not present

## 2020-05-20 DIAGNOSIS — Z471 Aftercare following joint replacement surgery: Secondary | ICD-10-CM | POA: Diagnosis not present

## 2020-05-20 DIAGNOSIS — Z96642 Presence of left artificial hip joint: Secondary | ICD-10-CM | POA: Diagnosis not present

## 2020-05-20 DIAGNOSIS — M1612 Unilateral primary osteoarthritis, left hip: Secondary | ICD-10-CM | POA: Diagnosis not present

## 2020-05-20 DIAGNOSIS — H401111 Primary open-angle glaucoma, right eye, mild stage: Secondary | ICD-10-CM | POA: Diagnosis not present

## 2020-05-22 DIAGNOSIS — Z471 Aftercare following joint replacement surgery: Secondary | ICD-10-CM | POA: Diagnosis not present

## 2020-05-22 DIAGNOSIS — Z96642 Presence of left artificial hip joint: Secondary | ICD-10-CM | POA: Diagnosis not present

## 2020-05-22 DIAGNOSIS — M1612 Unilateral primary osteoarthritis, left hip: Secondary | ICD-10-CM | POA: Diagnosis not present

## 2020-05-22 DIAGNOSIS — K219 Gastro-esophageal reflux disease without esophagitis: Secondary | ICD-10-CM | POA: Diagnosis not present

## 2020-05-22 DIAGNOSIS — H401111 Primary open-angle glaucoma, right eye, mild stage: Secondary | ICD-10-CM | POA: Diagnosis not present

## 2020-05-23 ENCOUNTER — Other Ambulatory Visit: Payer: Self-pay

## 2020-05-23 DIAGNOSIS — M5431 Sciatica, right side: Secondary | ICD-10-CM

## 2020-05-23 MED ORDER — HYDROCODONE-ACETAMINOPHEN 10-325 MG PO TABS: 1.0000 | ORAL_TABLET | Freq: Four times a day (QID) | ORAL | 0 refills | Status: DC | PRN

## 2020-05-23 NOTE — Telephone Encounter (Signed)
Patient would like a refill  on her Hydrocodone she is recovering from surgery (hip replacement) and is still in pain. Sent to [facility] for approval.

## 2020-05-26 DIAGNOSIS — Z96642 Presence of left artificial hip joint: Secondary | ICD-10-CM | POA: Diagnosis not present

## 2020-05-26 DIAGNOSIS — Z471 Aftercare following joint replacement surgery: Secondary | ICD-10-CM | POA: Diagnosis not present

## 2020-05-27 DIAGNOSIS — K219 Gastro-esophageal reflux disease without esophagitis: Secondary | ICD-10-CM | POA: Diagnosis not present

## 2020-05-27 DIAGNOSIS — Z471 Aftercare following joint replacement surgery: Secondary | ICD-10-CM | POA: Diagnosis not present

## 2020-05-27 DIAGNOSIS — H401111 Primary open-angle glaucoma, right eye, mild stage: Secondary | ICD-10-CM | POA: Diagnosis not present

## 2020-05-27 DIAGNOSIS — M1612 Unilateral primary osteoarthritis, left hip: Secondary | ICD-10-CM | POA: Diagnosis not present

## 2020-05-27 DIAGNOSIS — Z96642 Presence of left artificial hip joint: Secondary | ICD-10-CM | POA: Diagnosis not present

## 2020-05-29 DIAGNOSIS — H401111 Primary open-angle glaucoma, right eye, mild stage: Secondary | ICD-10-CM | POA: Diagnosis not present

## 2020-05-29 DIAGNOSIS — Z471 Aftercare following joint replacement surgery: Secondary | ICD-10-CM | POA: Diagnosis not present

## 2020-05-29 DIAGNOSIS — M1612 Unilateral primary osteoarthritis, left hip: Secondary | ICD-10-CM | POA: Diagnosis not present

## 2020-05-29 DIAGNOSIS — K219 Gastro-esophageal reflux disease without esophagitis: Secondary | ICD-10-CM | POA: Diagnosis not present

## 2020-05-29 DIAGNOSIS — Z96642 Presence of left artificial hip joint: Secondary | ICD-10-CM | POA: Diagnosis not present

## 2020-06-03 DIAGNOSIS — Z96642 Presence of left artificial hip joint: Secondary | ICD-10-CM | POA: Diagnosis not present

## 2020-06-03 DIAGNOSIS — Z471 Aftercare following joint replacement surgery: Secondary | ICD-10-CM | POA: Diagnosis not present

## 2020-06-03 DIAGNOSIS — H401111 Primary open-angle glaucoma, right eye, mild stage: Secondary | ICD-10-CM | POA: Diagnosis not present

## 2020-06-03 DIAGNOSIS — K219 Gastro-esophageal reflux disease without esophagitis: Secondary | ICD-10-CM | POA: Diagnosis not present

## 2020-06-03 DIAGNOSIS — M1612 Unilateral primary osteoarthritis, left hip: Secondary | ICD-10-CM | POA: Diagnosis not present

## 2020-06-05 DIAGNOSIS — H401111 Primary open-angle glaucoma, right eye, mild stage: Secondary | ICD-10-CM | POA: Diagnosis not present

## 2020-06-05 DIAGNOSIS — M1612 Unilateral primary osteoarthritis, left hip: Secondary | ICD-10-CM | POA: Diagnosis not present

## 2020-06-05 DIAGNOSIS — Z96642 Presence of left artificial hip joint: Secondary | ICD-10-CM | POA: Diagnosis not present

## 2020-06-05 DIAGNOSIS — Z471 Aftercare following joint replacement surgery: Secondary | ICD-10-CM | POA: Diagnosis not present

## 2020-06-05 DIAGNOSIS — K219 Gastro-esophageal reflux disease without esophagitis: Secondary | ICD-10-CM | POA: Diagnosis not present

## 2020-06-10 ENCOUNTER — Ambulatory Visit: Payer: Medicare Other | Attending: Sports Medicine | Admitting: Physical Therapy

## 2020-06-10 ENCOUNTER — Encounter: Payer: Self-pay | Admitting: Physical Therapy

## 2020-06-10 ENCOUNTER — Other Ambulatory Visit: Payer: Self-pay

## 2020-06-10 DIAGNOSIS — M25552 Pain in left hip: Secondary | ICD-10-CM | POA: Diagnosis not present

## 2020-06-10 DIAGNOSIS — R262 Difficulty in walking, not elsewhere classified: Secondary | ICD-10-CM | POA: Diagnosis not present

## 2020-06-10 DIAGNOSIS — M25652 Stiffness of left hip, not elsewhere classified: Secondary | ICD-10-CM | POA: Insufficient documentation

## 2020-06-10 DIAGNOSIS — M6281 Muscle weakness (generalized): Secondary | ICD-10-CM

## 2020-06-10 DIAGNOSIS — M25551 Pain in right hip: Secondary | ICD-10-CM | POA: Diagnosis not present

## 2020-06-10 NOTE — Therapy (Signed)
[facility] [address] [place], [place], [address] Phone: [phone]   Fax:  [phone]  Physical Therapy Evaluation  Patient Details  Name: [name] MRN: [id number] Date of Birth: 1945/08/05 Referring Provider (PT): Dr    Neta Mends Date: 06/10/2020   PT End of Session - 06/10/20 1159    Visit Number 1    Number of Visits 12    Date for PT Re-Evaluation 07/22/20    PT Start Time 1145    PT Stop Time 1228    PT Time Calculation (min) 43 min    Activity Tolerance Patient limited by pain;Patient limited by fatigue    Behavior During Therapy [facility] for tasks assessed/performed           Past Medical History:  Diagnosis Date  . Asymptomatic varicose veins   . Cancer (HCC)    breast   . Diverticulosis   . DM type 2 (diabetes mellitus, type 2) (HCC) 10/23/2014  . Dysuria   . GERD (gastroesophageal reflux disease)   . Hiatal hernia   . Hyperlipidemia   . Malignant neoplasm of breast (female), unspecified site   . Migraine without aura, without mention of intractable migraine without mention of status migrainosus   . Other abnormal blood chemistry   . Pain in joint, pelvic region and thigh   . Shingles    in eyes  . Stricture and stenosis of esophagus   . Trigger finger (acquired)   . Unspecified cataract   . Unspecified glaucoma(365.9)     Past Surgical History:  Procedure Laterality Date  . APPENDECTOMY  1973   Dr. Wilber Bihari  . BREAST LUMPECTOMY Right 08/29/2008   Dr. Harriette Bouillon  . ESOPHAGEAL DILATION    . NASAL POLYP EXCISION  10/2015  . POLYPECTOMY  11/20/14   Uterine Polyp Removed    There were no vitals filed for this visit.    Subjective Assessment - 06/10/20 1152    Subjective Patient had a left hip hip replacement on 05/14/2020. She had home health therapy for 3 weeks. About 2 weeks ago she was getting out of a chair and she had a pull in her hip. Since that point she has had significant pain.  She had her right hip replaced 5 months ago. She had home health therapy and had no major issues. She has been using a walker since prior to the right hip surgery. She had a posterior replacement.    Pertinent History DM, glaucoma, reports a myriad of other issues with her health, GERD    How long can you stand comfortably? unable to stand to do ADL's for more then a few minutes    How long can you walk comfortably? limited sidtances    Currently in Pain? Yes    Pain Score 5     Pain Location Hip    Pain Orientation Left    Pain Descriptors / Indicators Aching    Pain Type Chronic pain    Pain Onset More than a month ago    Pain Frequency Constant    Aggravating Factors  Standing and walking    Pain Relieving Factors rest and painmeds    Effect of Pain on Daily Activities limits ability to perfrom ADL's              [facility] PT Assessment - 06/10/20 0001      Assessment   Medical Diagnosis Left THA posterior     Referring Provider (PT) Dr  Onset Date/Surgical Date 05/14/20    Hand Dominance Right    Next MD Visit 06/23/2020    Prior Therapy Had Home helath therapy. Also had therapy in outpatient over a year ago      Precautions   Precautions Posterior Hip    Precaution Comments reviewed posterior hip percautions       Restrictions   Weight Bearing Restrictions Yes    LLE Weight Bearing Weight bearing as tolerated      Balance Screen   Has the patient fallen in the past 6 months No    Has the patient had a decrease in activity level because of a fear of falling?  No    Is the patient reluctant to leave their home because of a fear of falling?  No      Home Environment   Additional Comments ramp into the house.       Prior Function   Level of Independence Independent with household mobility with device    Vocation Retired      Associate Professor   Overall Cognitive Status Within Functional Limits for tasks assessed    Memory Appears intact    Awareness Appears intact     Problem Solving Appears intact      Observation/Other Assessments   Focus on Therapeutic Outcomes (FOTO)  81% limitation       Sensation   Light Touch Appears Intact    Additional Comments foot tingling at times. Right foot numb at baseline.       Coordination   Gross Motor Movements are Fluid and Coordinated Yes    Fine Motor Movements are Fluid and Coordinated Yes      ROM / Strength   AROM / PROM / Strength PROM      AROM   Overall AROM Comments pain full flexion to 80 degrees of flexion in standing. Unable to get patient into supine position       PROM   Overall PROM Comments unable to assess 32nd to difficulty getting back to supine       Strength   Strength Assessment Site Hip;Knee    Right/Left Hip Left;Right    Right Hip Flexion 5/5    Right Hip ABduction 5/5    Right Hip ADduction 5/5    Left Hip Flexion 4/5    Left Hip ABduction 4/5    Left Hip ADduction 4/5    Right/Left Knee Right;Left    Right Knee Flexion 5/5    Right Knee Extension 5/5    Left Knee Flexion 4/5    Left Knee Extension 4/5      Palpation   Palpation comment tedner to palpation in the lateral hip       Bed Mobility   Bed Mobility --   Patient unable      Transfers   Comments Slow transfer sit to stand using her hands.       Ambulation/Gait   Gait Comments decreased ability to weight bear on the left LE. Flexed turnk position. Decreased left hip flexion                       Objective measurements completed on examination: See above findings.       [facility] Adult PT Treatment/Exercise - 06/10/20 0001      Knee/Hip Exercises: Seated   Long Arc Quad AROM;Both;2 sets;15 reps    Other Seated Knee/Hip Exercises seated glute set 2x10     Abd/Adduction Limitations hip  abdcution yellow 2x10                   PT Education - 06/10/20 1336    Education Details reviewed HEP, symptom mangement, activity progression    Person(s) Educated Patient    Methods  Explanation;Demonstration;Tactile cues;Verbal cues    Comprehension Verbalized understanding;Returned demonstration;Verbal cues required;Tactile cues required            PT Short Term Goals - 06/10/20 1556      PT SHORT TERM GOAL #1   Title Patientwill transfer supine to sit  and sit to supine with supervision    Time 4    Period Weeks    Status New    Target Date 07/08/20      PT SHORT TERM GOAL #2   Title Patient will dmeonstrate full active hip flexion to 90 degrees    Time 4    Period Weeks    Status New    Target Date 07/08/20      PT SHORT TERM GOAL #3   Title Patient will ambaulte 300' without increased pain with good posture without cuing with LRAD    Time 4    Period Weeks    Status New    Target Date 07/08/20      PT SHORT TERM GOAL #4   Title Therapy will review FOTO    Baseline this continues to be a challange when she has pain, this makes her feel better.    Time 4    Period Weeks    Status New             PT Long Term Goals - 06/10/20 1601      PT LONG TERM GOAL #1   Title Patient will ambualte 3000' with LRAD without pain    Time 8    Period Weeks    Status New    Target Date 08/05/20      PT LONG TERM GOAL #2   Title Patient will demonstrate a 54 % on FOTO to demonstrate improved fucntion    Time 8    Period Weeks    Status New    Target Date 08/05/20      PT LONG TERM GOAL #3   Title Patient will transfer sit to stand without use of the hands without increased pain    Time 8    Period Weeks    Status New    Target Date 08/05/20                  Plan - 06/10/20 1337    Clinical Impression Statement Patient is a88 year old female S/P left hip replacement (posterior) on 05/14/2020. She presents with left hip weakness, decreased hip flexion, and pain in weightbearing. She was doing well for the first 2 weeks but expierienced an increase in pain when transfering from a chair about 2 weeks ago. She reports the pain has been  constant. It has not gotten better or worse. She has an appointment in under 2 weeks with the MD and the MD is aware. She was advised that if the pain increases with activty we will hold until her appointment otherwise we will continue as tolerated. She would like to progress off the walker if able.    Personal Factors and Comorbidities Comorbidity 3+    Comorbidities GERD, DM, right hip replacement; bilateral LE edema    Examination-Activity Limitations Carry;Squat;Stairs;Dressing;Locomotion Level;Transfers;Bed Mobility    Examination-Participation Restrictions Meal Prep;Cleaning;Community  Activity;Laundry;Yard Work    [employer] officer Evolving/Moderate complexity    Clinical Decision Making Moderate    Rehab Potential Good    PT Frequency 2x / week    PT Duration 8 weeks    PT Treatment/Interventions ADLs/Self Care Home Management;Cryotherapy;Electrical Stimulation;Iontophoresis 4mg /ml Dexamethasone;Moist Heat;Therapeutic activities;Functional mobility training;Gait training;Therapeutic exercise;Balance training;Neuromuscular re-education;Patient/family education;Dry needling;Ultrasound;Manual techniques;Taping    PT Next Visit Plan If we can safely get her in supine/semi-reccembent position assess hip flexion ROM; begin seated exercises, begin standing exercises . Progress to gait training as able. Review FOTO    PT Home Exercise Plan has a good exercise program at home.           Patient will benefit from skilled therapeutic intervention in order to improve the following deficits and impairments:  Abnormal gait, Cardiopulmonary status limiting activity, Decreased activity tolerance, Decreased endurance, Decreased range of motion, Decreased strength, Difficulty walking, Increased edema, Decreased balance, Decreased mobility, Impaired flexibility, Obesity, Increased muscle spasms, Pain, Improper body mechanics, Decreased knowledge of use of DME  Visit Diagnosis: Pain in right  hip  Pain in left hip  Stiffness of left hip, not elsewhere classified  Difficulty in walking, not elsewhere classified  Muscle weakness (generalized)     Problem List Patient Active Problem List   Diagnosis Date Noted  . Neurogenic claudication due to lumbar spinal stenosis 05/28/2019  . Right groin pain 05/28/2019  . Polymyalgia rheumatica ([place]) 10/09/2018  . BMI 40.0-44.9, adult ([place]) 10/09/2018  . Foraminal stenosis of lumbar region 05/11/2018  . 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 On prednisone therapy 02/14/2018  . Herpes zoster 10/16/2017  . Chronic pain of both shoulders 08/10/2017  . Prediabetes 05/26/2017  . Primary osteoarthritis of both hips 03/15/2017  . Trochanteric bursitis of left hip 03/15/2017  . Recurrent epistaxis 12/09/2016  . Dysuria 11/03/2016  . Insomnia 11/03/2016  . Glaucoma 05/05/2016  . LPRD (laryngopharyngeal reflux disease) 01/26/2016  . Erosive esophagitis 11/27/2015  . Schatzki's ring of distal esophagus 11/27/2015  . Chronic sinusitis 10/16/2015  . Deviated nasal septum 10/16/2015  . Nasal polyp 10/16/2015  . Osteoporosis 08/15/2015  . Dysphagia, pharyngoesophageal phase 04/01/2015  . Endometrial polyp 10/24/2014  . Fatigue 07/09/2014  . Hyperlipidemia   . Breast cancer, right ([place]) 02/01/2014  . Malignant melanoma of arm ([place]) 11/23/2013  . Nasal septum ulceration 10/16/2013  . Seborrheic keratosis 10/16/2013  . Vitamin D deficiency 02/08/2013  . Sciatica of right side 01/24/2013  . Esophageal ulcer 01/24/2013  . Esophageal stenosis 01/24/2013  . Choroidal nevus of right eye 10/27/2012  . Impacted cerumen of right ear 10/11/2012  . Frequency 01/19/2012  . Gastroesophageal reflux disease 09/01/2011  . 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 H/O cataract extraction 04/30/2011  . Borderline glaucoma steroid responder 04/30/2011  . Class 2  obesity due to excess calories without serious comorbidity with body mass index (BMI) of 39.0 to 39.9 in adult 10/16/2008  . Breast cancer of lower-outer quadrant of right female breast ([place]) 08/29/2008    Carney Living PT DPT  06/10/2020, 4:09 PM  [facility] [address] [place], [place], [address] Phone: [phone]   Fax:  [phone]  Name: [name] MRN: [id number] Date of Birth: Aug 24, 1945

## 2020-06-12 ENCOUNTER — Other Ambulatory Visit: Payer: Self-pay

## 2020-06-12 ENCOUNTER — Ambulatory Visit: Payer: Medicare Other | Admitting: Physical Therapy

## 2020-06-12 DIAGNOSIS — M25551 Pain in right hip: Secondary | ICD-10-CM | POA: Diagnosis not present

## 2020-06-12 DIAGNOSIS — R262 Difficulty in walking, not elsewhere classified: Secondary | ICD-10-CM

## 2020-06-12 DIAGNOSIS — M25652 Stiffness of left hip, not elsewhere classified: Secondary | ICD-10-CM | POA: Diagnosis not present

## 2020-06-12 DIAGNOSIS — M25552 Pain in left hip: Secondary | ICD-10-CM

## 2020-06-12 DIAGNOSIS — M6281 Muscle weakness (generalized): Secondary | ICD-10-CM

## 2020-06-12 NOTE — Therapy (Signed)
[facility] [place], [place], [address] Phone: [phone]   Fax:  [phone]  Physical Therapy Treatment  Patient Details  Name: [name] MRN: [id number] Date of Birth: [birth year] Referring Provider (PT): Dr    Gareth Morgan Date: 06/12/2020   PT End of Session - 06/12/20 2030    Visit Number 2    Number of Visits 12    Date for PT Re-Evaluation 07/22/20    PT Start Time 1145    PT Stop Time 1226    PT Time Calculation (min) 41 min    Activity Tolerance Patient limited by pain;Patient limited by fatigue    Behavior During Therapy [facility] for tasks assessed/performed           Past Medical History:  Diagnosis Date  . Asymptomatic varicose veins   . Cancer (HCC)    breast   . Diverticulosis   . DM type 2 (diabetes mellitus, type 2) ([place]) 10/23/2014  . Dysuria   . GERD (gastroesophageal reflux disease)   . Hiatal hernia   . Hyperlipidemia   . Malignant neoplasm of breast (female), unspecified site   . Migraine without aura, without mention of intractable migraine without mention of status migrainosus   . Other abnormal blood chemistry   . Pain in joint, pelvic region and thigh   . Shingles    in eyes  . Stricture and stenosis of esophagus   . Trigger finger (acquired)   . Unspecified cataract   . Unspecified glaucoma(365.9)     Past Surgical History:  Procedure Laterality Date  . APPENDECTOMY  1973   Dr. Linzie Collin  . BREAST LUMPECTOMY Right 08/29/2008   Dr. Erroll Luna  . ESOPHAGEAL DILATION    . NASAL POLYP EXCISION  10/2015  . POLYPECTOMY  11/20/14   Uterine Polyp Removed    There were no vitals filed for this visit.   Subjective Assessment - 06/12/20 2001    Subjective Patient had been feeling good following the lat treatmnet. Today she was leaving to come to therapy and she twisted her hip trying to avoid a cat. She reports it is now sore. It is still better then it was last visit. She  continues to use her walker.    How long can you stand comfortably? unable to stand to do ADL's for more then a few minutes    How long can you walk comfortably? limited sidtances    Patient Stated Goals have less pain and walk better    Currently in Pain? Yes    Pain Score 5     Pain Orientation Left    Pain Descriptors / Indicators Aching    Pain Onset More than a month ago    Pain Frequency Constant    Aggravating Factors  standing and walking    Pain Relieving Factors rest and pain meds                             [facility] Adult PT Treatment/Exercise - 06/12/20 0001      Knee/Hip Exercises: Standing   Heel Raises Limitations x20     Hip Flexion Limitations standing march left leg 3x10     Functional Squat Limitations 2x10       Knee/Hip Exercises: Seated   Long Arc Quad AROM;Both;2 sets;15 reps    Other Seated Knee/Hip Exercises seated glute set 3x10     Hamstring Limitations 3x10 red  Abd/Adduction Limitations hip abdcution yellow 3x10 red      Manual Therapy   Soft tissue mobilization roller to anterior and lateral hip in sitting                   PT Education - 06/12/20 2030    Education Details HEP and symptom mangement    Person(s) Educated Patient    Methods Explanation;Tactile cues;Demonstration;Verbal cues    Comprehension Verbalized understanding;Returned demonstration;Verbal cues required;Tactile cues required            PT Short Term Goals - 06/10/20 1556      PT SHORT TERM GOAL #1   Title Patientwill transfer supine to sit  and sit to supine with supervision    Time 4    Period Weeks    Status New    Target Date 07/08/20      PT SHORT TERM GOAL #2   Title Patient will dmeonstrate full active hip flexion to 90 degrees    Time 4    Period Weeks    Status New    Target Date 07/08/20      PT SHORT TERM GOAL #3   Title Patient will ambaulte 300' without increased pain with good posture without cuing with LRAD    Time 4     Period Weeks    Status New    Target Date 07/08/20      PT SHORT TERM GOAL #4   Title Therapy will review FOTO    Baseline this continues to be a challange when she has pain, this makes her feel better.    Time 4    Period Weeks    Status New             PT Long Term Goals - 06/10/20 1601      PT LONG TERM GOAL #1   Title Patient will ambualte 3000' with LRAD without pain    Time 8    Period Weeks    Status New    Target Date 08/05/20      PT LONG TERM GOAL #2   Title Patient will demonstrate a 54 % on FOTO to demonstrate improved fucntion    Time 8    Period Weeks    Status New    Target Date 08/05/20      PT LONG TERM GOAL #3   Title Patient will transfer sit to stand without use of the hands without increased pain    Time 8    Period Weeks    Status New    Target Date 08/05/20                 Plan - 06/12/20 1639    Clinical Impression Statement Patients pain was better today then last visit. . She had soreness in her quad and lateral hip. She did not have much pain in her posteriro hip. Therapy focused on the rolleer to the lateral hip. She fatigued with standing exercises. She reported pain in her back when she stands up straight. Trunk flexion may be baseline for her. We will continue to progress as tolerated. Most of her pain was in her lateral hip today,    Comorbidities GERD, DM, right hip replacement; bilateral LE edema    Examination-Activity Limitations Carry;Squat;Stairs;Dressing;Locomotion Level;Transfers;Bed Mobility    Examination-Participation Restrictions Meal Prep;Cleaning;Community Activity;Laundry;Yard Work    Stability/Clinical Decision Making Evolving/Moderate complexity    Clinical Decision Making Moderate    Rehab Potential Good  PT Frequency 2x / week    PT Duration 8 weeks    PT Treatment/Interventions ADLs/Self Care Home Management;Cryotherapy;Electrical Stimulation;Iontophoresis 4mg /ml Dexamethasone;Moist Heat;Therapeutic  activities;Functional mobility training;Gait training;Therapeutic exercise;Balance training;Neuromuscular re-education;Patient/family education;Dry needling;Ultrasound;Manual techniques;Taping    PT Next Visit Plan If we can safely get her in supine/semi-reccembent position assess hip flexion ROM; begin seated exercises, begin standing exercises . Progress to gait training as able. Review FOTO    PT Home Exercise Plan has a good exercise program at home.    Consulted and Agree with Plan of Care Patient           Patient will benefit from skilled therapeutic intervention in order to improve the following deficits and impairments:  Abnormal gait, Cardiopulmonary status limiting activity, Decreased activity tolerance, Decreased endurance, Decreased range of motion, Decreased strength, Difficulty walking, Increased edema, Decreased balance, Decreased mobility, Impaired flexibility, Obesity, Increased muscle spasms, Pain, Improper body mechanics, Decreased knowledge of use of DME  Visit Diagnosis: Pain in left hip  Stiffness of left hip, not elsewhere classified  Difficulty in walking, not elsewhere classified  Muscle weakness (generalized)  Pain in right hip     Problem List Patient Active Problem List   Diagnosis Date Noted  . Neurogenic claudication due to lumbar spinal stenosis 05/28/2019  . Right groin pain 05/28/2019  . Polymyalgia rheumatica ([place]) 10/09/2018  . BMI 40.0-44.9, adult ([place]) 10/09/2018  . Foraminal stenosis of lumbar region 05/11/2018  . 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 On prednisone therapy 02/14/2018  . Herpes zoster 10/16/2017  . Chronic pain of both shoulders 08/10/2017  . Prediabetes 05/26/2017  . Primary osteoarthritis of both hips 03/15/2017  . Trochanteric bursitis of left hip 03/15/2017  . Recurrent epistaxis 12/09/2016  . Dysuria 11/03/2016  . Insomnia 11/03/2016  . Glaucoma 05/05/2016  . LPRD (laryngopharyngeal  reflux disease) 01/26/2016  . Erosive esophagitis 11/27/2015  . Schatzki's ring of distal esophagus 11/27/2015  . Chronic sinusitis 10/16/2015  . Deviated nasal septum 10/16/2015  . Nasal polyp 10/16/2015  . Osteoporosis 08/15/2015  . Dysphagia, pharyngoesophageal phase 04/01/2015  . Endometrial polyp 10/24/2014  . Fatigue 07/09/2014  . Hyperlipidemia   . Breast cancer, right ([place]) 02/01/2014  . Malignant melanoma of arm ([place]) 11/23/2013  . Nasal septum ulceration 10/16/2013  . Seborrheic keratosis 10/16/2013  . Vitamin D deficiency 02/08/2013  . Sciatica of right side 01/24/2013  . Esophageal ulcer 01/24/2013  . Esophageal stenosis 01/24/2013  . Choroidal nevus of right eye 10/27/2012  . Impacted cerumen of right ear 10/11/2012  . Frequency 01/19/2012  . Gastroesophageal reflux disease 09/01/2011  . 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 H/O cataract extraction 04/30/2011  . Borderline glaucoma steroid responder 04/30/2011  . Class 2 obesity due to excess calories without serious comorbidity with body mass index (BMI) of 39.0 to 39.9 in adult 10/16/2008  . Breast cancer of lower-outer quadrant of right female breast ([place]) 08/29/2008    Carney Living PT DPT  06/12/2020, 8:36 PM  [place], [place], [address] Phone: [phone]   Fax:  [phone]  Name: AMULYA QUINTIN MRN: [id number] Date of Birth: March 10, 1946

## 2020-06-16 ENCOUNTER — Ambulatory Visit: Payer: Medicare Other | Admitting: Physical Therapy

## 2020-06-16 DIAGNOSIS — E000 Congenital iodine-deficiency syndrome, neurological type: Secondary | ICD-10-CM | POA: Diagnosis not present

## 2020-06-17 ENCOUNTER — Encounter: Payer: Medicare Other | Admitting: Physical Therapy

## 2020-06-18 ENCOUNTER — Encounter: Payer: Self-pay | Admitting: Physical Therapy

## 2020-06-18 ENCOUNTER — Other Ambulatory Visit: Payer: Self-pay

## 2020-06-18 ENCOUNTER — Ambulatory Visit: Payer: Medicare Other | Admitting: Physical Therapy

## 2020-06-18 DIAGNOSIS — M25552 Pain in left hip: Secondary | ICD-10-CM

## 2020-06-18 DIAGNOSIS — M25551 Pain in right hip: Secondary | ICD-10-CM | POA: Diagnosis not present

## 2020-06-18 DIAGNOSIS — M6281 Muscle weakness (generalized): Secondary | ICD-10-CM | POA: Diagnosis not present

## 2020-06-18 DIAGNOSIS — R262 Difficulty in walking, not elsewhere classified: Secondary | ICD-10-CM | POA: Diagnosis not present

## 2020-06-18 DIAGNOSIS — M25652 Stiffness of left hip, not elsewhere classified: Secondary | ICD-10-CM | POA: Diagnosis not present

## 2020-06-18 NOTE — Therapy (Signed)
[place], [place], [address] Phone: [phone]   Fax:  [phone]  Physical Therapy Treatment  Patient Details  Name: [name] MRN: [id number] Date of Birth: 24-Sep-1945 Referring Provider (PT): Dr    Gareth Morgan Date: 06/18/2020   PT End of Session - 06/18/20 1558    Visit Number 3    Number of Visits 12    Date for PT Re-Evaluation 07/22/20    PT Start Time 4403   Patient 8 minutes late   PT Stop Time 1223   increased pain limited treatment time   PT Time Calculation (min) 30 min    Equipment Utilized During Treatment Right knee immobilizer    Activity Tolerance Patient limited by pain;Patient limited by fatigue    Behavior During Therapy [facility] for tasks assessed/performed           Past Medical History:  Diagnosis Date   Asymptomatic varicose veins    Cancer ([place])    breast    Diverticulosis    DM type 2 (diabetes mellitus, type 2) ([place]) 10/23/2014   Dysuria    GERD (gastroesophageal reflux disease)    Hiatal hernia    Hyperlipidemia    Malignant neoplasm of breast (female), unspecified site    Migraine without aura, without mention of intractable migraine without mention of status migrainosus    Other abnormal blood chemistry    Pain in joint, pelvic region and thigh    Shingles    in eyes   Stricture and stenosis of esophagus    Trigger finger (acquired)    Unspecified cataract    Unspecified glaucoma(365.9)     Past Surgical History:  Procedure Laterality Date   APPENDECTOMY  1973   Dr. Linzie Collin   BREAST LUMPECTOMY Right 08/29/2008   Dr. Erroll Luna   ESOPHAGEAL DILATION     NASAL POLYP EXCISION  10/2015   POLYPECTOMY  11/20/14   Uterine Polyp Removed    There were no vitals filed for this visit.   Subjective Assessment - 06/18/20 1209    Subjective Patient reports yesterday was a really goodday. The tow days beofre were reall bad. She can not  remeber a reason. She continues to have increased pain in weight bearing.    Pertinent History DM, glaucoma, reports a myriad of other issues with her health, GERD    Currently in Pain? Yes    Pain Score 5     Pain Location Hip    Pain Orientation Left    Pain Descriptors / Indicators Aching    Pain Type Surgical pain    Pain Radiating Towards groin pain    Pain Onset More than a month ago    Pain Frequency Constant    Aggravating Factors  standing and walking    Pain Relieving Factors rest and pain meds    Effect of Pain on Daily Activities limits ability to perfrom ADL's                             [facility] Adult PT Treatment/Exercise - 06/18/20 0001      Lumbar Exercises: Aerobic   Other Aerobic Exercise in limited range monitored hip flexion on nu-step       Lumbar Exercises: Standing   Heel Raises Limitations weight shifts forward with cuing for posture. Patient did 10 then reported increased pain in her hip       Lumbar Exercises:  Seated   LAQ on Chair Limitations 2x10     Other Seated Lumbar Exercises seated cl[place]ell 2x10       Manual Therapy   Soft tissue mobilization roller to anterior and lateral hip in sitting                   PT Education - 06/18/20 1558    Education Details reviewed HEP and symptom mangement    Person(s) Educated Patient    Methods Explanation;Tactile cues;Verbal cues;Demonstration    Comprehension Verbalized understanding;Verbal cues required;Returned demonstration            PT Short Term Goals - 06/10/20 1556      PT SHORT TERM GOAL #1   Title Patientwill transfer supine to sit  and sit to supine with supervision    Time 4    Period Weeks    Status New    Target Date 07/08/20      PT SHORT TERM GOAL #2   Title Patient will dmeonstrate full active hip flexion to 90 degrees    Time 4    Period Weeks    Status New    Target Date 07/08/20      PT SHORT TERM GOAL #3   Title Patient will ambaulte 300'  without increased pain with good posture without cuing with LRAD    Time 4    Period Weeks    Status New    Target Date 07/08/20      PT SHORT TERM GOAL #4   Title Therapy will review FOTO    Baseline this continues to be a challange when she has pain, this makes her feel better.    Time 4    Period Weeks    Status New             PT Long Term Goals - 06/10/20 1601      PT LONG TERM GOAL #1   Title Patient will ambualte 3000' with LRAD without pain    Time 8    Period Weeks    Status New    Target Date 08/05/20      PT LONG TERM GOAL #2   Title Patient will demonstrate a 54 % on FOTO to demonstrate improved fucntion    Time 8    Period Weeks    Status New    Target Date 08/05/20      PT LONG TERM GOAL #3   Title Patient will transfer sit to stand without use of the hands without increased pain    Time 8    Period Weeks    Status New    Target Date 08/05/20                 Plan - 06/18/20 1600    Clinical Impression Statement Patient was limited by pain today. We have been unable to asses her hip flexion 2nd to inabiulity to lie supine. Today we were able to get her on the nu-step. Her flexion was carefully monitored. She was able to flex to 70 degrees before pain. After she had nio significant increase in pain but she was still limited in her activity tolerance folllwing light stretching. She will have a follow up with the MD on Monday. She has flet like somewthing is not right sinvce she had an incent getting out of a chair. She was advised to let us know if he finds anything.    Personal Factors and Comorbidities Comorbidity 3+  Comorbidities GERD, DM, right hip replacement; bilateral LE edema    Stability/Clinical Decision Making Evolving/Moderate complexity    Clinical Decision Making Moderate    Rehab Potential Good    PT Frequency 2x / week    PT Duration 8 weeks    PT Treatment/Interventions ADLs/Self Care Home Management;Cryotherapy;Electrical  Stimulation;Iontophoresis 4mg /ml Dexamethasone;Moist Heat;Therapeutic activities;Functional mobility training;Gait training;Therapeutic exercise;Balance training;Neuromuscular re-education;Patient/family education;Dry needling;Ultrasound;Manual techniques;Taping    PT Next Visit Plan If we can safely get her in supine/semi-reccembent position assess hip flexion ROM; begin seated exercises, begin standing exercises . Progress to gait training as able. Review FOTO    PT Home Exercise Plan has a good exercise program at home.    Consulted and Agree with Plan of Care Patient           Patient will benefit from skilled therapeutic intervention in order to improve the following deficits and impairments:  Abnormal gait, Cardiopulmonary status limiting activity, Decreased activity tolerance, Decreased endurance, Decreased range of motion, Decreased strength, Difficulty walking, Increased edema, Decreased balance, Decreased mobility, Impaired flexibility, Obesity, Increased muscle spasms, Pain, Improper body mechanics, Decreased knowledge of use of DME  Visit Diagnosis: Pain in left hip  Stiffness of left hip, not elsewhere classified  Difficulty in walking, not elsewhere classified  Muscle weakness (generalized)  Pain in right hip     Problem List Patient Active Problem List   Diagnosis Date Noted   Neurogenic claudication due to lumbar spinal stenosis 05/28/2019   Right groin pain 05/28/2019   Polymyalgia rheumatica ([place]) 10/09/2018   BMI 40.0-44.9, adult ([place]) 10/09/2018   Foraminal stenosis of lumbar region 05/11/2018   Spinal stenosis of lumbar region at multiple levels 05/11/2018   On prednisone therapy 02/14/2018   Herpes zoster 10/16/2017   Chronic pain of both shoulders 08/10/2017   Prediabetes 05/26/2017   Primary osteoarthritis of both hips 03/15/2017   Trochanteric bursitis of left hip 03/15/2017   Recurrent epistaxis 12/09/2016   Dysuria 11/03/2016    Insomnia 11/03/2016   Glaucoma 05/05/2016   LPRD (laryngopharyngeal reflux disease) 01/26/2016   Erosive esophagitis 11/27/2015   Schatzki's ring of distal esophagus 11/27/2015   Chronic sinusitis 10/16/2015   Deviated nasal septum 10/16/2015   Nasal polyp 10/16/2015   Osteoporosis 08/15/2015   Dysphagia, pharyngoesophageal phase 04/01/2015   Endometrial polyp 10/24/2014   Fatigue 07/09/2014   Hyperlipidemia    Breast cancer, right ([place]) 02/01/2014   Malignant melanoma of arm ([place]) 11/23/2013   Nasal septum ulceration 10/16/2013   Seborrheic keratosis 10/16/2013   Vitamin D deficiency 02/08/2013   Sciatica of right side 01/24/2013   Esophageal ulcer 01/24/2013   Esophageal stenosis 01/24/2013   Choroidal nevus of right eye 10/27/2012   Impacted cerumen of right ear 10/11/2012   Frequency 01/19/2012   Gastroesophageal reflux disease 09/01/2011   Horseshoe tear of retina without detachment 08/03/2011   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 [place], [place], [address] Phone: [phone]   Fax:  [phone]  Name: [name] MRN: [id number] Date of Birth: 09/28/1945

## 2020-06-23 ENCOUNTER — Other Ambulatory Visit: Payer: Self-pay

## 2020-06-23 DIAGNOSIS — Z471 Aftercare following joint replacement surgery: Secondary | ICD-10-CM | POA: Diagnosis not present

## 2020-06-23 DIAGNOSIS — M5431 Sciatica, right side: Secondary | ICD-10-CM

## 2020-06-23 DIAGNOSIS — Z96642 Presence of left artificial hip joint: Secondary | ICD-10-CM | POA: Diagnosis not present

## 2020-06-23 MED ORDER — HYDROCODONE-ACETAMINOPHEN 10-325 MG PO TABS: 1.0000 | ORAL_TABLET | Freq: Four times a day (QID) | ORAL | 0 refills | Status: DC | PRN

## 2020-06-23 NOTE — Telephone Encounter (Signed)
Patient called requesting a refill. Verified pharmacy as [facility] [address]. She asked that [facility] [address] from her list, which was done.   Last refill 05/23/20.  Contract done 06/25/19.  Has an OV scheduled for 08/14/20.

## 2020-06-27 ENCOUNTER — Ambulatory Visit: Payer: Medicare Other | Attending: Sports Medicine | Admitting: Physical Therapy

## 2020-06-27 ENCOUNTER — Other Ambulatory Visit: Payer: Self-pay

## 2020-06-27 ENCOUNTER — Encounter: Payer: Self-pay | Admitting: Physical Therapy

## 2020-06-27 DIAGNOSIS — M25652 Stiffness of left hip, not elsewhere classified: Secondary | ICD-10-CM | POA: Diagnosis not present

## 2020-06-27 DIAGNOSIS — R262 Difficulty in walking, not elsewhere classified: Secondary | ICD-10-CM | POA: Diagnosis not present

## 2020-06-27 DIAGNOSIS — M25551 Pain in right hip: Secondary | ICD-10-CM | POA: Insufficient documentation

## 2020-06-27 DIAGNOSIS — M6281 Muscle weakness (generalized): Secondary | ICD-10-CM

## 2020-06-27 DIAGNOSIS — M25552 Pain in left hip: Secondary | ICD-10-CM | POA: Diagnosis not present

## 2020-06-27 NOTE — Therapy (Addendum)
[place], [place], [address] Phone: [phone]   Fax:  [phone]  Physical Therapy Treatment  Patient Details  Name: [name] MRN: [id number] Date of Birth: 1945/12/18 Referring Provider (PT): Dr    Gareth Morgan Date: 06/27/2020   PT End of Session - 06/27/20 1107    Visit Number 4    Number of Visits 12    Date for PT Re-Evaluation 07/22/20    PT Start Time 1058    PT Stop Time 1145    PT Time Calculation (min) 47 min    Activity Tolerance Patient limited by fatigue;Patient tolerated treatment well    Behavior During Therapy [facility] for tasks assessed/performed           Past Medical History:  Diagnosis Date  . Asymptomatic varicose veins   . Cancer (H[facility])    breast   . Diverticulosis   . DM type 2 (diabetes mellitus, type 2) ([place]) 10/23/2014  . Dysuria   . GERD (gastroesophageal reflux disease)   . Hiatal hernia   . Hyperlipidemia   . Malignant neoplasm of breast (female), unspecified site   . Migraine without aura, without mention of intractable migraine without mention of status migrainosus   . Other abnormal blood chemistry   . Pain in joint, pelvic region and thigh   . Shingles    in eyes  . Stricture and stenosis of esophagus   . Trigger finger (acquired)   . Unspecified cataract   . Unspecified glaucoma(365.9)     Past Surgical History:  Procedure Laterality Date  . APPENDECTOMY  1973   Dr. Linzie Collin  . BREAST LUMPECTOMY Right 08/29/2008   Dr. Erroll Luna  . ESOPHAGEAL DILATION    . NASAL POLYP EXCISION  10/2015  . POLYPECTOMY  11/20/14   Uterine Polyp Removed    There were no vitals filed for this visit.   Subjective Assessment - 06/27/20 1057    Subjective "MD said x-ray looked good, no damage, could have pulled some stiches. Right in the front part of my left hip there is some pain that takes my breath away. MD told me that pain could be sciatic or low back pain.  Feeling better overall, I've started taking some muscle relaxers since Monday and I've been sleeping better."    Pertinent History DM, glaucoma, reports a myriad of other issues with her health, GERD    Pain Onset More than a month ago                             [facility] Adult PT Treatment/Exercise - 06/27/20 0001      Self-Care   Self-Care Other Self-Care Comments    Other Self-Care Comments  Self-massage of tennis ball on left glute med for reduction of back pain      Lumbar Exercises: Aerobic   Other Aerobic Exercise in limited range monitored hip flexion on nu-step (no more than 65degrees)      Lumbar Exercises: Seated   Other Seated Lumbar Exercises seated clamshells 2x10 yellow TB      Knee/Hip Exercises: Standing   Hip Flexion AROM;Stengthening;Both;3 sets;10 reps    Hip Flexion Limitations sink counter HHA    Other Standing Knee Exercises Side-stepping at sink counter for HHA 6 passes back and forth      Knee/Hip Exercises: Seated   Long Arc Quad AROM;Both;15 reps;3 sets    Marching AROM;Both;1 set;5  sets;Limitations    Marching Limitations Pt reported some left adductor pain at the top of march (but monitored to keep below 90)      Modalities   Modalities Cryotherapy      Cryotherapy   Number Minutes Cryotherapy 10 Minutes    Cryotherapy Location Hip   left hip, seated   Type of Cryotherapy Ice pack      Manual Therapy   Soft tissue mobilization Left glute med at superior attachment to sacrum/ilium                  PT Education - 06/27/20 1107    Education Details Reviewed self-massage tennis ball; importance of strengthening to reduce back pain    Person(s) Educated Patient    Methods Explanation    Comprehension Verbalized understanding            PT Short Term Goals - 06/10/20 1556      PT SHORT TERM GOAL #1   Title Patientwill transfer supine to sit  and sit to supine with supervision    Time 4    Period Weeks    Status New     Target Date 07/08/20      PT SHORT TERM GOAL #2   Title Patient will dmeonstrate full active hip flexion to 90 degrees    Time 4    Period Weeks    Status New    Target Date 07/08/20      PT SHORT TERM GOAL #3   Title Patient will ambaulte 300' without increased pain with good posture without cuing with LRAD    Time 4    Period Weeks    Status New    Target Date 07/08/20      PT SHORT TERM GOAL #4   Title Therapy will review FOTO    Baseline this continues to be a challange when she has pain, this makes her feel better.    Time 4    Period Weeks    Status New             PT Long Term Goals - 06/10/20 1601      PT LONG TERM GOAL #1   Title Patient will ambualte 3000' with LRAD without pain    Time 8    Period Weeks    Status New    Target Date 08/05/20      PT LONG TERM GOAL #2   Title Patient will demonstrate a 54 % on FOTO to demonstrate improved fucntion    Time 8    Period Weeks    Status New    Target Date 08/05/20      PT LONG TERM GOAL #3   Title Patient will transfer sit to stand without use of the hands without increased pain    Time 8    Period Weeks    Status New    Target Date 08/05/20                 Plan - 06/27/20 1108    Clinical Impression Statement Pt reported left lowback and anterior thigh pain so some time was spent on manual therapy for pain relief and utilization of self-massage with tennis ball. Nu-step with UE only was utilized with monitoring for hip flexion ROM and pt reported that it 'felt really good.' Patient tolerated seated and standing exercises well with minimal cuing.    Personal Factors and Comorbidities Comorbidity 3+    Comorbidities GERD, DM, right  hip replacement; bilateral LE edema    Stability/Clinical Decision Making Evolving/Moderate complexity    Rehab Potential Good    PT Frequency 2x / week    PT Duration 8 weeks    PT Treatment/Interventions ADLs/Self Care Home Management;Cryotherapy;Electrical  Stimulation;Iontophoresis 4mg /ml Dexamethasone;Moist Heat;Therapeutic activities;Functional mobility training;Gait training;Therapeutic exercise;Balance training;Neuromuscular re-education;Patient/family education;Dry needling;Ultrasound;Manual techniques;Taping    PT Next Visit Plan If we can safely get her in supine/semi-reccembent position assess hip flexion ROM; begin seated exercises, begin standing exercises . Progress to gait training as able. Review FOTO    PT Home Exercise Plan has a good exercise program at home.    Consulted and Agree with Plan of Care Patient           Patient will benefit from skilled therapeutic intervention in order to improve the following deficits and impairments:  Abnormal gait, Cardiopulmonary status limiting activity, Decreased activity tolerance, Decreased endurance, Decreased range of motion, Decreased strength, Difficulty walking, Increased edema, Decreased balance, Decreased mobility, Impaired flexibility, Obesity, Increased muscle spasms, Pain, Improper body mechanics, Decreased knowledge of use of DME  Visit Diagnosis: Pain in left hip  Stiffness of left hip, not elsewhere classified  Difficulty in walking, not elsewhere classified  Muscle weakness (generalized)     Problem List Patient Active Problem List   Diagnosis Date Noted  . Neurogenic claudication due to lumbar spinal stenosis 05/28/2019  . Right groin pain 05/28/2019  . Polymyalgia rheumatica ([place]) 10/09/2018  . BMI 40.0-44.9, adult ([place]) 10/09/2018  . Foraminal stenosis of lumbar region 05/11/2018  . 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 On prednisone therapy 02/14/2018  . Herpes zoster 10/16/2017  . Chronic pain of both shoulders 08/10/2017  . Prediabetes 05/26/2017  . Primary osteoarthritis of both hips 03/15/2017  . Trochanteric bursitis of left hip 03/15/2017  . Recurrent epistaxis 12/09/2016  . Dysuria 11/03/2016  . Insomnia 11/03/2016  .  Glaucoma 05/05/2016  . LPRD (laryngopharyngeal reflux disease) 01/26/2016  . Erosive esophagitis 11/27/2015  . Schatzki's ring of distal esophagus 11/27/2015  . Chronic sinusitis 10/16/2015  . Deviated nasal septum 10/16/2015  . Nasal polyp 10/16/2015  . Osteoporosis 08/15/2015  . Dysphagia, pharyngoesophageal phase 04/01/2015  . Endometrial polyp 10/24/2014  . Fatigue 07/09/2014  . Hyperlipidemia   . Breast cancer, right ([place]) 02/01/2014  . Malignant melanoma of arm ([place]) 11/23/2013  . Nasal septum ulceration 10/16/2013  . Seborrheic keratosis 10/16/2013  . Vitamin D deficiency 02/08/2013  . Sciatica of right side 01/24/2013  . Esophageal ulcer 01/24/2013  . Esophageal stenosis 01/24/2013  . Choroidal nevus of right eye 10/27/2012  . Impacted cerumen of right ear 10/11/2012  . Frequency 01/19/2012  . Gastroesophageal reflux disease 09/01/2011  . 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 H/O cataract extraction 04/30/2011  . Borderline glaucoma steroid responder 04/30/2011  . Class 2 obesity due to excess calories without serious comorbidity with body mass index (BMI) of 39.0 to 39.9 in adult 10/16/2008  . Breast cancer of lower-outer quadrant of right female breast ([place]) 08/29/2008   Carolyne Littles PT DPT  06/27/2020   Dawayne Cirri, SPT 06/27/2020, 11:45 AM  [facility] [address] [place], [place], [address] Phone: [phone]   Fax:  [phone]  Name: [name] MRN: [id number] Date of Birth: 11-05-1945

## 2020-07-03 ENCOUNTER — Encounter: Payer: Self-pay | Admitting: Physical Therapy

## 2020-07-03 ENCOUNTER — Other Ambulatory Visit: Payer: Self-pay

## 2020-07-03 ENCOUNTER — Ambulatory Visit: Payer: Medicare Other | Admitting: Physical Therapy

## 2020-07-03 DIAGNOSIS — M25652 Stiffness of left hip, not elsewhere classified: Secondary | ICD-10-CM

## 2020-07-03 DIAGNOSIS — M25551 Pain in right hip: Secondary | ICD-10-CM | POA: Diagnosis not present

## 2020-07-03 DIAGNOSIS — M25552 Pain in left hip: Secondary | ICD-10-CM

## 2020-07-03 DIAGNOSIS — M6281 Muscle weakness (generalized): Secondary | ICD-10-CM

## 2020-07-03 DIAGNOSIS — R262 Difficulty in walking, not elsewhere classified: Secondary | ICD-10-CM | POA: Diagnosis not present

## 2020-07-04 NOTE — Therapy (Signed)
[place], [place], [address] Phone: [phone]   Fax:  [phone]  Physical Therapy Treatment  Patient Details  Name: [name] MRN: [id number] Date of Birth: 1945/10/31 Referring Provider (PT): Dr    Gareth Morgan Date: 07/03/2020   PT End of Session - 07/04/20 0915    Visit Number 5    Number of Visits 12    Date for PT Re-Evaluation 07/22/20    PT Start Time 5329    PT Stop Time 9242    PT Time Calculation (min) 50 min    Activity Tolerance Patient limited by fatigue;Patient tolerated treatment well    Behavior During Therapy [facility] for tasks assessed/performed           Past Medical History:  Diagnosis Date  . Asymptomatic varicose veins   . Cancer (HCC)    breast   . Diverticulosis   . DM type 2 (diabetes mellitus, type 2) ([place]) 10/23/2014  . Dysuria   . GERD (gastroesophageal reflux disease)   . Hiatal hernia   . Hyperlipidemia   . Malignant neoplasm of breast (female), unspecified site   . Migraine without aura, without mention of intractable migraine without mention of status migrainosus   . Other abnormal blood chemistry   . Pain in joint, pelvic region and thigh   . Shingles    in eyes  . Stricture and stenosis of esophagus   . Trigger finger (acquired)   . Unspecified cataract   . Unspecified glaucoma(365.9)     Past Surgical History:  Procedure Laterality Date  . APPENDECTOMY  1973   Dr. Linzie Collin  . BREAST LUMPECTOMY Right 08/29/2008   Dr. Erroll Luna  . ESOPHAGEAL DILATION    . NASAL POLYP EXCISION  10/2015  . POLYPECTOMY  11/20/14   Uterine Polyp Removed    There were no vitals filed for this visit.   Subjective Assessment - 07/03/20 1113    Subjective Patient took a pian pill before coming. She felt painful over the past few days>S he has been putting a hot pack on her back.    Pertinent History DM, glaucoma, reports a myriad of other issues with her health, GERD     How long can you stand comfortably? unable to stand to do ADL's for more then a few minutes    How long can you walk comfortably? limited sidtances    Patient Stated Goals have less pain and walk better    Currently in Pain? Yes    Pain Score 3     Pain Location Hip    Pain Orientation Left    Pain Descriptors / Indicators Aching    Pain Type Chronic pain    Pain Onset More than a month ago    Pain Frequency Constant    Aggravating Factors  standing and walking    Pain Relieving Factors resat and pain meds    Effect of Pain on Daily Activities imited ability to perfrom ADL's                             [facility] Adult PT Treatment/Exercise - 07/04/20 0001      Ambulation/Gait   Gait Comments Patient ambualted in II bars a 6 laps with a seated rest break using right UE support; Patient required a seated rest break. She reported increased pain and fatigue      Lumbar Exercises: Aerobic  Nustep 5 min    Other Aerobic Exercise in limited range monitored hip flexion on nu-step (no more than 70) less difficulty and tightness      Lumbar Exercises: Standing   Heel Raises Limitations weight shifts forward with cuing for posture. Patient did 10 then reported increased pain in her hip       Lumbar Exercises: Seated   LAQ on Chair Limitations x20    Other Seated Lumbar Exercises seated clamshells 2x10 yellow TB      Knee/Hip Exercises: Standing   Hip Flexion AROM;Stengthening;Both;3 sets;10 reps    Hip Flexion Limitations in II bars      Modalities   Modalities Moist Heat      Moist Heat Therapy   Number Minutes Moist Heat 10 Minutes    Moist Heat Location Hip   1 on hip 1 on quad     Manual Therapy   Soft tissue mobilization roller to anterior hip                  PT Education - 07/04/20 0915    Education Details HEP and symptom mangement    Person(s) Educated Patient    Methods Explanation;Tactile cues;Demonstration    Comprehension Verbalized  understanding;Returned demonstration;Verbal cues required;Tactile cues required            PT Short Term Goals - 06/10/20 1556      PT SHORT TERM GOAL #1   Title Patientwill transfer supine to sit  and sit to supine with supervision    Time 4    Period Weeks    Status New    Target Date 07/08/20      PT SHORT TERM GOAL #2   Title Patient will dmeonstrate full active hip flexion to 90 degrees    Time 4    Period Weeks    Status New    Target Date 07/08/20      PT SHORT TERM GOAL #3   Title Patient will ambaulte 300' without increased pain with good posture without cuing with LRAD    Time 4    Period Weeks    Status New    Target Date 07/08/20      PT SHORT TERM GOAL #4   Title Therapy will review FOTO    Baseline this continues to be a challange when she has pain, this makes her feel better.    Time 4    Period Weeks    Status New             PT Long Term Goals - 06/10/20 1601      PT LONG TERM GOAL #1   Title Patient will ambualte 3000' with LRAD without pain    Time 8    Period Weeks    Status New    Target Date 08/05/20      PT LONG TERM GOAL #2   Title Patient will demonstrate a 54 % on FOTO to demonstrate improved fucntion    Time 8    Period Weeks    Status New    Target Date 08/05/20      PT LONG TERM GOAL #3   Title Patient will transfer sit to stand without use of the hands without increased pain    Time 8    Period Weeks    Status New    Target Date 08/05/20                 Plan - 07/04/20  0916    Clinical Impression Statement Patient continues to be limited by fatigue and anteriro quad pain but oit is improving. She felt like this visit her hip flexion was better and less stressful. Patient focused on gsait training today. She is still unsafe to practice at home. She needs more strengthening and stability training. We will continue to practice gait training with single UE support.    Comorbidities GERD, DM, right hip replacement;  bilateral LE edema    Examination-Activity Limitations Carry;Squat;Stairs;Dressing;Locomotion Level;Transfers;Bed Mobility    Examination-Participation Restrictions Meal Prep;Cleaning;Community Activity;Laundry;Yard Work    [employer] officer Evolving/Moderate complexity    Clinical Decision Making Moderate    Rehab Potential Good    PT Frequency 2x / week    PT Duration 8 weeks    PT Treatment/Interventions ADLs/Self Care Home Management;Cryotherapy;Electrical Stimulation;Iontophoresis 17m/ml Dexamethasone;Moist Heat;Therapeutic activities;Functional mobility training;Gait training;Therapeutic exercise;Balance training;Neuromuscular re-education;Patient/family education;Dry needling;Ultrasound;Manual techniques;Taping    PT Next Visit Plan If we can safely get her in supine/semi-reccembent position assess hip flexion ROM; begin seated exercises, begin standing exercises . Progress to gait training as able. Review FOTO    PT Home Exercise Plan has a good exercise program at home.    Consulted and Agree with Plan of Care Patient           Patient will benefit from skilled therapeutic intervention in order to improve the following deficits and impairments:  Abnormal gait,Cardiopulmonary status limiting activity,Decreased activity tolerance,Decreased endurance,Decreased range of motion,Decreased strength,Difficulty walking,Increased edema,Decreased balance,Decreased mobility,Impaired flexibility,Obesity,Increased muscle spasms,Pain,Improper body mechanics,Decreased knowledge of use of DME  Visit Diagnosis: Pain in left hip  Stiffness of left hip, not elsewhere classified  Difficulty in walking, not elsewhere classified  Muscle weakness (generalized)  Pain in right hip     Problem List Patient Active Problem List   Diagnosis Date Noted  . Neurogenic claudication due to lumbar spinal stenosis 05/28/2019  . Right groin pain 05/28/2019  . [place] ([place]  10/09/2018  . BMI 40.0-44.9, adult ([place] 10/09/2018  . Foraminal stenosis of lumbar region 05/11/2018  . 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 On prednisone therapy 02/14/2018  . Herpes zoster 10/16/2017  . Chronic pain of both shoulders 08/10/2017  . Prediabetes 05/26/2017  . Primary osteoarthritis of both hips 03/15/2017  . Trochanteric bursitis of left hip 03/15/2017  . Recurrent epistaxis 12/09/2016  . Dysuria 11/03/2016  . Insomnia 11/03/2016  . Glaucoma 05/05/2016  . LPRD (laryngopharyngeal reflux disease) 01/26/2016  . Erosive esophagitis 11/27/2015  . Schatzki's ring of distal esophagus 11/27/2015  . Chronic sinusitis 10/16/2015  . Deviated nasal septum 10/16/2015  . Nasal polyp 10/16/2015  . Osteoporosis 08/15/2015  . Dysphagia, pharyngoesophageal phase 04/01/2015  . Endometrial polyp 10/24/2014  . Fatigue 07/09/2014  . Hyperlipidemia   . Breast cancer, right ([place] 02/01/2014  . Malignant melanoma of arm ([place] 11/23/2013  . Nasal septum ulceration 10/16/2013  . Seborrheic keratosis 10/16/2013  . Vitamin D deficiency 02/08/2013  . Sciatica of right side 01/24/2013  . Esophageal ulcer 01/24/2013  . Esophageal stenosis 01/24/2013  . Choroidal nevus of right eye 10/27/2012  . Impacted cerumen of right ear 10/11/2012  . Frequency 01/19/2012  . Gastroesophageal reflux disease 09/01/2011  . 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 H/O cataract extraction 04/30/2011  . Borderline glaucoma steroid responder 04/30/2011  . Class 2 obesity due to excess calories without serious comorbidity with body mass index (BMI) of 39.0  to 39.9 in adult 10/16/2008  . Breast cancer of lower-outer quadrant of right female breast ([place]) 08/29/2008    Carney Living PT DPT  07/04/2020, 9:20 AM  [facility] [address] [place], [place], [address] Phone:  [phone]   Fax:  [phone]  Name: TYASIA PACKARD MRN: [id number] Date of Birth: 02/21/1946

## 2020-07-07 ENCOUNTER — Encounter: Payer: Self-pay | Admitting: Physical Therapy

## 2020-07-07 ENCOUNTER — Ambulatory Visit: Payer: Medicare Other | Admitting: Physical Therapy

## 2020-07-07 ENCOUNTER — Other Ambulatory Visit: Payer: Self-pay

## 2020-07-07 DIAGNOSIS — R262 Difficulty in walking, not elsewhere classified: Secondary | ICD-10-CM

## 2020-07-07 DIAGNOSIS — M25552 Pain in left hip: Secondary | ICD-10-CM | POA: Diagnosis not present

## 2020-07-07 DIAGNOSIS — M6281 Muscle weakness (generalized): Secondary | ICD-10-CM | POA: Diagnosis not present

## 2020-07-07 DIAGNOSIS — M25551 Pain in right hip: Secondary | ICD-10-CM | POA: Diagnosis not present

## 2020-07-07 DIAGNOSIS — M25652 Stiffness of left hip, not elsewhere classified: Secondary | ICD-10-CM

## 2020-07-07 NOTE — Therapy (Signed)
[place], [place], [address] Phone: [phone]   Fax:  [phone]  Physical Therapy Treatment  Patient Details  Name: [name] MRN: [id number] Date of Birth: 1946-05-08 Referring Provider (PT): Dr    Gareth Morgan Date: 07/07/2020   PT End of Session - 07/07/20 1314    Visit Number 6    Number of Visits 12    Date for PT Re-Evaluation 07/22/20    PT Start Time 1147    PT Stop Time 1238    PT Time Calculation (min) 51 min    Activity Tolerance Patient limited by fatigue;Patient tolerated treatment well    Behavior During Therapy [facility] for tasks assessed/performed           Past Medical History:  Diagnosis Date  . Asymptomatic varicose veins   . Cancer (HCC)    breast   . Diverticulosis   . DM type 2 (diabetes mellitus, type 2) ([place]) 10/23/2014  . Dysuria   . GERD (gastroesophageal reflux disease)   . Hiatal hernia   . Hyperlipidemia   . Malignant neoplasm of breast (female), unspecified site   . Migraine without aura, without mention of intractable migraine without mention of status migrainosus   . Other abnormal blood chemistry   . Pain in joint, pelvic region and thigh   . Shingles    in eyes  . Stricture and stenosis of esophagus   . Trigger finger (acquired)   . Unspecified cataract   . Unspecified glaucoma(365.9)     Past Surgical History:  Procedure Laterality Date  . APPENDECTOMY  1973   Dr. Linzie Collin  . BREAST LUMPECTOMY Right 08/29/2008   Dr. Erroll Luna  . ESOPHAGEAL DILATION    . NASAL POLYP EXCISION  10/2015  . POLYPECTOMY  11/20/14   Uterine Polyp Removed    There were no vitals filed for this visit.   Subjective Assessment - 07/07/20 1212    Subjective Patient reports she was able to go out to eat over the weekend. She has been praciticing a little with her cane at home. She did not get overly sore after the last visit.    Pertinent History DM, glaucoma, reports a  myriad of other issues with her health, GERD    How long can you stand comfortably? unable to stand to do ADL's for more then a few minutes    How long can you walk comfortably? limited sidtances    Patient Stated Goals have less pain and walk better    Currently in Pain? Yes    Pain Score 5     Pain Location Hip    Pain Orientation Left    Pain Descriptors / Indicators Aching    Pain Type Chronic pain    Pain Onset More than a month ago    Pain Frequency Constant                             [facility] Adult PT Treatment/Exercise - 07/07/20 0001      Ambulation/Gait   Gait Comments Ambusalted with a cane 75'. She did very well the first 35 '. As she fatigued her hip flexion increased and weight bearing decreased on the left leg.      Lumbar Exercises: Aerobic   Nustep 6 min    Other Aerobic Exercise more moion noted today. Cued not to go past 90 degrees  Lumbar Exercises: Standing   Heel Raises Limitations weight shifts forward with cuing for posture. Patient did 10 then reported increased pain in her hip       Lumbar Exercises: Seated   LAQ on Chair Limitations x20    Other Seated Lumbar Exercises seated clamshells 2x10 yellow TB; bilateral LAQ x20; hamstring curl right x20;      Knee/Hip Exercises: Standing   Forward Step Up Limitations x7 and x8 2inch bilateral UE support                  PT Education - 07/07/20 1312    Education Details gait with a cane; step ups    Person(s) Educated Patient    Methods Explanation;Demonstration;Tactile cues;Verbal cues    Comprehension Returned demonstration;Verbal cues required;Tactile cues required;Verbalized understanding            PT Short Term Goals - 06/10/20 1556      PT SHORT TERM GOAL #1   Title Patientwill transfer supine to sit  and sit to supine with supervision    Time 4    Period Weeks    Status New    Target Date 07/08/20      PT SHORT TERM GOAL #2   Title Patient will dmeonstrate  full active hip flexion to 90 degrees    Time 4    Period Weeks    Status New    Target Date 07/08/20      PT SHORT TERM GOAL #3   Title Patient will ambaulte 300' without increased pain with good posture without cuing with LRAD    Time 4    Period Weeks    Status New    Target Date 07/08/20      PT SHORT TERM GOAL #4   Title Therapy will review FOTO    Baseline this continues to be a challange when she has pain, this makes her feel better.    Time 4    Period Weeks    Status New             PT Long Term Goals - 06/10/20 1601      PT LONG TERM GOAL #1   Title Patient will ambualte 3000' with LRAD without pain    Time 8    Period Weeks    Status New    Target Date 08/05/20      PT LONG TERM GOAL #2   Title Patient will demonstrate a 54 % on FOTO to demonstrate improved fucntion    Time 8    Period Weeks    Status New    Target Date 08/05/20      PT LONG TERM GOAL #3   Title Patient will transfer sit to stand without use of the hands without increased pain    Time 8    Period Weeks    Status New    Target Date 08/05/20                 Plan - 07/07/20 1315    Clinical Impression Statement Patient is making gprogreess. she continues to have limited tolerance but overall it is doing better. She is doing hader activity's. Her C/O continues to be pain in her quad. Therapy will continue to progress as tolerated. She ambualted with a cane 75'. She was fatigued by the en of gait training. She will continue working on her exercises at home.    Personal Factors and Comorbidities Comorbidity 3+  Comorbidities GERD, DM, right hip replacement; bilateral LE edema    Examination-Activity Limitations Carry;Squat;Stairs;Dressing;Locomotion Level;Transfers;Bed Mobility    Examination-Participation Restrictions Meal Prep;Cleaning;Community Activity;Laundry;Yard Work    [employer] officer Evolving/Moderate complexity    Clinical Decision Making Moderate     Rehab Potential Good    PT Frequency 2x / week    PT Duration 8 weeks    PT Treatment/Interventions ADLs/Self Care Home Management;Cryotherapy;Electrical Stimulation;Iontophoresis 4mg /ml Dexamethasone;Moist Heat;Therapeutic activities;Functional mobility training;Gait training;Therapeutic exercise;Balance training;Neuromuscular re-education;Patient/family education;Dry needling;Ultrasound;Manual techniques;Taping    PT Next Visit Plan If we can safely get her in supine/semi-reccembent position assess hip flexion ROM; begin seated exercises, begin standing exercises . Progress to gait training as able. Review FOTO    PT Home Exercise Plan has a good exercise program at home.    Consulted and Agree with Plan of Care Patient           Patient will benefit from skilled therapeutic intervention in order to improve the following deficits and impairments:  Abnormal gait,Cardiopulmonary status limiting activity,Decreased activity tolerance,Decreased endurance,Decreased range of motion,Decreased strength,Difficulty walking,Increased edema,Decreased balance,Decreased mobility,Impaired flexibility,Obesity,Increased muscle spasms,Pain,Improper body mechanics,Decreased knowledge of use of DME  Visit Diagnosis: Pain in left hip  Stiffness of left hip, not elsewhere classified  Difficulty in walking, not elsewhere classified  Muscle weakness (generalized)  Pain in right hip     Problem List Patient Active Problem List   Diagnosis Date Noted  . Neurogenic claudication due to lumbar spinal stenosis 05/28/2019  . Right groin pain 05/28/2019  . Polymyalgia rheumatica ([place]) 10/09/2018  . BMI 40.0-44.9, adult ([place]) 10/09/2018  . Foraminal stenosis of lumbar region 05/11/2018  . 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 On prednisone therapy 02/14/2018  . Herpes zoster 10/16/2017  . Chronic pain of both shoulders 08/10/2017  . Prediabetes 05/26/2017  . Primary  osteoarthritis of both hips 03/15/2017  . Trochanteric bursitis of left hip 03/15/2017  . Recurrent epistaxis 12/09/2016  . Dysuria 11/03/2016  . Insomnia 11/03/2016  . Glaucoma 05/05/2016  . LPRD (laryngopharyngeal reflux disease) 01/26/2016  . Erosive esophagitis 11/27/2015  . Schatzki's ring of distal esophagus 11/27/2015  . Chronic sinusitis 10/16/2015  . Deviated nasal septum 10/16/2015  . Nasal polyp 10/16/2015  . Osteoporosis 08/15/2015  . Dysphagia, pharyngoesophageal phase 04/01/2015  . Endometrial polyp 10/24/2014  . Fatigue 07/09/2014  . Hyperlipidemia   . Breast cancer, right ([place]) 02/01/2014  . Malignant melanoma of arm ([place]) 11/23/2013  . Nasal septum ulceration 10/16/2013  . Seborrheic keratosis 10/16/2013  . Vitamin D deficiency 02/08/2013  . Sciatica of right side 01/24/2013  . Esophageal ulcer 01/24/2013  . Esophageal stenosis 01/24/2013  . Choroidal nevus of right eye 10/27/2012  . Impacted cerumen of right ear 10/11/2012  . Frequency 01/19/2012  . Gastroesophageal reflux disease 09/01/2011  . 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 H/O cataract extraction 04/30/2011  . Borderline glaucoma steroid responder 04/30/2011  . Class 2 obesity due to excess calories without serious comorbidity with body mass index (BMI) of 39.0 to 39.9 in adult 10/16/2008  . Breast cancer of lower-outer quadrant of right female breast ([place]) 08/29/2008    Carney Living PT DPT  07/07/2020, 1:23 PM  [facility] [address] [place], [place], [address] Phone: [phone]   Fax:  [phone]  Name: [name] MRN: [id number] Date of Birth: 07/18/1946

## 2020-07-09 ENCOUNTER — Other Ambulatory Visit: Payer: Self-pay

## 2020-07-09 ENCOUNTER — Ambulatory Visit: Payer: Medicare Other | Admitting: Physical Therapy

## 2020-07-09 DIAGNOSIS — M25652 Stiffness of left hip, not elsewhere classified: Secondary | ICD-10-CM | POA: Diagnosis not present

## 2020-07-09 DIAGNOSIS — R262 Difficulty in walking, not elsewhere classified: Secondary | ICD-10-CM

## 2020-07-09 DIAGNOSIS — M6281 Muscle weakness (generalized): Secondary | ICD-10-CM | POA: Diagnosis not present

## 2020-07-09 DIAGNOSIS — M25552 Pain in left hip: Secondary | ICD-10-CM | POA: Diagnosis not present

## 2020-07-09 DIAGNOSIS — M25551 Pain in right hip: Secondary | ICD-10-CM | POA: Diagnosis not present

## 2020-07-10 NOTE — Therapy (Signed)
[place], [place], [address] Phone: [phone]   Fax:  [phone]  Physical Therapy Treatment  Patient Details  Name: [name] MRN: [id number] Date of Birth: Nov 08, 1945 Referring Provider (PT): Dr    Gareth Morgan Date: 07/09/2020   PT End of Session - 07/10/20 0838    Visit Number 7    Number of Visits 12    Date for PT Re-Evaluation 07/22/20    PT Start Time 1150    PT Stop Time 1254    PT Time Calculation (min) 64 min    Activity Tolerance Patient limited by fatigue;Patient tolerated treatment well    Behavior During Therapy [facility] for tasks assessed/performed           Past Medical History:  Diagnosis Date  . Asymptomatic varicose veins   . Cancer (HCC)    breast   . Diverticulosis   . DM type 2 (diabetes mellitus, type 2) ([place]) 10/23/2014  . Dysuria   . GERD (gastroesophageal reflux disease)   . Hiatal hernia   . Hyperlipidemia   . Malignant neoplasm of breast (female), unspecified site   . Migraine without aura, without mention of intractable migraine without mention of status migrainosus   . Other abnormal blood chemistry   . Pain in joint, pelvic region and thigh   . Shingles    in eyes  . Stricture and stenosis of esophagus   . Trigger finger (acquired)   . Unspecified cataract   . Unspecified glaucoma(365.9)     Past Surgical History:  Procedure Laterality Date  . APPENDECTOMY  1973   Dr. Linzie Collin  . BREAST LUMPECTOMY Right 08/29/2008   Dr. Erroll Luna  . ESOPHAGEAL DILATION    . NASAL POLYP EXCISION  10/2015  . POLYPECTOMY  11/20/14   Uterine Polyp Removed    There were no vitals filed for this visit.   Subjective Assessment - 07/10/20 0833    Subjective Patient reports today has been more sore. She did a lot of walking over the past few days. She comes in with a noticable antalgic gait entering the clinic. Her pain is focused mostly in her mid thigh area. Today it  is also in her lateral hip.    Pertinent History DM, glaucoma, reports a myriad of other issues with her health, GERD    How long can you walk comfortably? limited sidtances    Patient Stated Goals have less pain and walk better    Currently in Pain? Yes    Pain Score 5     Pain Location Hip    Pain Orientation Left    Pain Descriptors / Indicators Aching    Pain Type Chronic pain    Pain Onset More than a month ago    Pain Frequency Constant    Aggravating Factors  standing and walking    Pain Relieving Factors rest and pain meds    Effect of Pain on Daily Activities limited ability to perfrom ADL's                             [facility] Adult PT Treatment/Exercise - 07/10/20 0001      Lumbar Exercises: Aerobic   Nustep 6 min    Other Aerobic Exercise while monitoring motion      Lumbar Exercises: Seated   LAQ on Chair Limitations x20    Other Seated Lumbar Exercises seated clamshells 3x10 yellow  TB; bilateral ; hamstring curl right x20;      Knee/Hip Exercises: Standing   Hip Flexion AROM;Stengthening;Both;3 sets;10 reps    Hip Flexion Limitations in II bars    Other Standing Knee Exercises standing slow march 2x10; heel raise 2x10      Modalities   Modalities Moist Heat      Moist Heat Therapy   Number Minutes Moist Heat 15 Minutes    Moist Heat Location Hip   to hip and back     Manual Therapy   Soft tissue mobilization roller to anterior hip; with pattint leaning on ball (hip flexion <90 trigger poitn reelase to posterior hip;                  PT Education - 07/10/20 0837    Education Details reveiwed self soft ttissue mobilization with tennis ball and to her anterior qaud area    Person(s) Educated Patient    Methods Explanation;Demonstration;Verbal cues;Tactile cues    Comprehension Returned demonstration;Verbal cues required;Tactile cues required;Verbalized understanding            PT Short Term Goals - 06/10/20 1556      PT SHORT  TERM GOAL #1   Title Patientwill transfer supine to sit  and sit to supine with supervision    Time 4    Period Weeks    Status New    Target Date 07/08/20      PT SHORT TERM GOAL #2   Title Patient will dmeonstrate full active hip flexion to 90 degrees    Time 4    Period Weeks    Status New    Target Date 07/08/20      PT SHORT TERM GOAL #3   Title Patient will ambaulte 300' without increased pain with good posture without cuing with LRAD    Time 4    Period Weeks    Status New    Target Date 07/08/20      PT SHORT TERM GOAL #4   Title Therapy will review FOTO    Baseline this continues to be a challange when she has pain, this makes her feel better.    Time 4    Period Weeks    Status New             PT Long Term Goals - 06/10/20 1601      PT LONG TERM GOAL #1   Title Patient will ambualte 3000' with LRAD without pain    Time 8    Period Weeks    Status New    Target Date 08/05/20      PT LONG TERM GOAL #2   Title Patient will demonstrate a 54 % on FOTO to demonstrate improved fucntion    Time 8    Period Weeks    Status New    Target Date 08/05/20      PT LONG TERM GOAL #3   Title Patient will transfer sit to stand without use of the hands without increased pain    Time 8    Period Weeks    Status New    Target Date 08/05/20                 Plan - 07/09/20 1203    Clinical Impression Statement Patient had more baseline pain today. She continues to have pain in a focal part of her quad and her left lateral hip. Therapy perfromed manual therapy to her anterior and lateral  hip. He rhip flexion is improving on the nu-step. Therap alos performed manual therapy to her lumbar spine. She had a large trigger point in her sacrla area. She continues to have a low tolerance to standing activity. Her lumbar spine is likley effecting her progress. Therapy will continue to progress as tolerated.    Personal Factors and Comorbidities Comorbidity 3+     Comorbidities GERD, DM, right hip replacement; bilateral LE edema    Examination-Activity Limitations Carry;Squat;Stairs;Dressing;Locomotion Level;Transfers;Bed Mobility    Examination-Participation Restrictions Meal Prep;Cleaning;Community Activity;Laundry;[employer] Work    PT Treatment/Interventions ADLs/Self Care Home Management;Cryotherapy;Electrical Stimulation;Iontophoresis 4mg /ml Dexamethasone;Moist Heat;Therapeutic activities;Functional mobility training;Gait training;Therapeutic exercise;Balance training;Neuromuscular re-education;Patient/family education;Dry needling;Ultrasound;Manual techniques;Taping    PT Next Visit Plan we hope to continue with stair and gait training but some days hse is limited. Assess respose to soft tisseu mobilziation ogf her back    PT Home Exercise Plan has a good exercise program at home.    Consulted and Agree with Plan of Care Patient           Patient will benefit from skilled therapeutic intervention in order to improve the following deficits and impairments:  Abnormal gait,Cardiopulmonary status limiting activity,Decreased activity tolerance,Decreased endurance,Decreased range of motion,Decreased strength,Difficulty walking,Increased edema,Decreased balance,Decreased mobility,Impaired flexibility,Obesity,Increased muscle spasms,Pain,Improper body mechanics,Decreased knowledge of use of DME  Visit Diagnosis: Pain in left hip  Stiffness of left hip, not elsewhere classified  Difficulty in walking, not elsewhere classified  Muscle weakness (generalized)  Pain in right hip     Problem List Patient Active Problem List   Diagnosis Date Noted  . Neurogenic claudication due to lumbar spinal stenosis 05/28/2019  . Right groin pain 05/28/2019  . Polymyalgia rheumatica ([place]) 10/09/2018  . BMI 40.0-44.9, adult ([place]) 10/09/2018  . Foraminal stenosis of lumbar region 05/11/2018  . 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 On  prednisone therapy 02/14/2018  . Herpes zoster 10/16/2017  . Chronic pain of both shoulders 08/10/2017  . Prediabetes 05/26/2017  . Primary osteoarthritis of both hips 03/15/2017  . Trochanteric bursitis of left hip 03/15/2017  . Recurrent epistaxis 12/09/2016  . Dysuria 11/03/2016  . Insomnia 11/03/2016  . Glaucoma 05/05/2016  . LPRD (laryngopharyngeal reflux disease) 01/26/2016  . Erosive esophagitis 11/27/2015  . Schatzki's ring of distal esophagus 11/27/2015  . Chronic sinusitis 10/16/2015  . Deviated nasal septum 10/16/2015  . Nasal polyp 10/16/2015  . Osteoporosis 08/15/2015  . Dysphagia, pharyngoesophageal phase 04/01/2015  . Endometrial polyp 10/24/2014  . Fatigue 07/09/2014  . Hyperlipidemia   . Breast cancer, right ([place]) 02/01/2014  . Malignant melanoma of arm ([place]) 11/23/2013  . Nasal septum ulceration 10/16/2013  . Seborrheic keratosis 10/16/2013  . Vitamin D deficiency 02/08/2013  . Sciatica of right side 01/24/2013  . Esophageal ulcer 01/24/2013  . Esophageal stenosis 01/24/2013  . Choroidal nevus of right eye 10/27/2012  . Impacted cerumen of right ear 10/11/2012  . Frequency 01/19/2012  . Gastroesophageal reflux disease 09/01/2011  . 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 H/O cataract extraction 04/30/2011  . Borderline glaucoma steroid responder 04/30/2011  . Class 2 obesity due to excess calories without serious comorbidity with body mass index (BMI) of 39.0 to 39.9 in adult 10/16/2008  . Breast cancer of lower-outer quadrant of right female breast ([place]) 08/29/2008    Carney Living PT DPT  07/10/2020, 1:15 PM  [facility] [address] [place], [place], [address] Phone:  [phone]   Fax:  [phone]  Name: [name] MRN: [id number] Date of Birth: 14-Jun-1946

## 2020-07-14 ENCOUNTER — Encounter: Payer: Self-pay | Admitting: Physical Therapy

## 2020-07-14 ENCOUNTER — Ambulatory Visit: Payer: Medicare Other | Admitting: Physical Therapy

## 2020-07-14 ENCOUNTER — Other Ambulatory Visit: Payer: Self-pay

## 2020-07-14 DIAGNOSIS — R262 Difficulty in walking, not elsewhere classified: Secondary | ICD-10-CM

## 2020-07-14 DIAGNOSIS — M25652 Stiffness of left hip, not elsewhere classified: Secondary | ICD-10-CM | POA: Diagnosis not present

## 2020-07-14 DIAGNOSIS — M25551 Pain in right hip: Secondary | ICD-10-CM

## 2020-07-14 DIAGNOSIS — M25552 Pain in left hip: Secondary | ICD-10-CM

## 2020-07-14 DIAGNOSIS — M6281 Muscle weakness (generalized): Secondary | ICD-10-CM

## 2020-07-14 NOTE — Therapy (Signed)
[facility] [address] [place], [place], [address] Phone: [phone]   Fax:  [phone]  Physical Therapy Treatment  Patient Details  Name: [name] MRN: [id number] Date of Birth: [birth year] Referring Provider (PT): Dr    Neta Mends Date: 07/14/2020   PT End of Session - 07/14/20 1117    Visit Number 8    Number of Visits 12    Date for PT Re-Evaluation 07/22/20    PT Start Time 1110   Patient 10 minutes late   PT Stop Time 1155    PT Time Calculation (min) 45 min    Activity Tolerance Patient limited by fatigue;Patient tolerated treatment well    Behavior During Therapy [facility] for tasks assessed/performed           Past Medical History:  Diagnosis Date  . Asymptomatic varicose veins   . Cancer (HCC)    breast   . Diverticulosis   . DM type 2 (diabetes mellitus, type 2) (HCC) 10/23/2014  . Dysuria   . GERD (gastroesophageal reflux disease)   . Hiatal hernia   . Hyperlipidemia   . Malignant neoplasm of breast (female), unspecified site   . Migraine without aura, without mention of intractable migraine without mention of status migrainosus   . Other abnormal blood chemistry   . Pain in joint, pelvic region and thigh   . Shingles    in eyes  . Stricture and stenosis of esophagus   . Trigger finger (acquired)   . Unspecified cataract   . Unspecified glaucoma(365.9)     Past Surgical History:  Procedure Laterality Date  . APPENDECTOMY  1973   Dr. Wilber Bihari  . BREAST LUMPECTOMY Right 08/29/2008   Dr. Harriette Bouillon  . ESOPHAGEAL DILATION    . NASAL POLYP EXCISION  10/2015  . POLYPECTOMY  11/20/14   Uterine Polyp Removed    There were no vitals filed for this visit.   Subjective Assessment - 07/14/20 1115    Subjective Patient reports the pain in her thigh and lower back improved. She had very little pain over the weekend and slept better.    Pertinent History DM, glaucoma, reports a myriad of other issues  with her health, GERD    How long can you stand comfortably? unable to stand to do ADL's for more then a few minutes    How long can you walk comfortably? limited sidtances    Patient Stated Goals have less pain and walk better    Currently in Pain? Yes    Pain Score 1     Pain Location Hip    Pain Orientation Left    Pain Descriptors / Indicators Aching    Pain Type Chronic pain    Pain Onset More than a month ago    Pain Frequency Constant    Aggravating Factors  standing and walking    Pain Relieving Factors rest and pain meds    Effect of Pain on Daily Activities limited ability to perfrom ADL's                             [facility] Adult PT Treatment/Exercise - 07/14/20 0001      Lumbar Exercises: Aerobic   Nustep 6 min    Other Aerobic Exercise while monitoring motion      Lumbar Exercises: Standing   Heel Raises Limitations x20      Lumbar Exercises: Seated   LAQ  on Chair Limitations x20    Other Seated Lumbar Exercises seated clamshells 3x10 red TB; bilateral ; hamstring curl ried x20;      Knee/Hip Exercises: Standing   Hip Flexion AROM;Stengthening;Both;3 sets;10 reps    Hip Flexion Limitations in II bars    Other Standing Knee Exercises standing slow march 2x10; heel raise 2x10      Modalities   Modalities Moist Heat      Moist Heat Therapy   Number Minutes Moist Heat 10 Minutes    Moist Heat Location Hip   to hip and back     Manual Therapy   Manual therapy comments trigger point release to lumbar spine, sacral area, and gluteal.    Soft tissue mobilization roller to anterior hip; with pattint leaning on ball (hip flexion <90 trigger poitn reelase to posterior hip;                  PT Education - 07/14/20 1159    Education Details reviewed progression of activity    Person(s) Educated Patient    Methods Explanation;Demonstration;Tactile cues;Verbal cues    Comprehension Verbalized understanding;Returned demonstration;Verbal cues  required;Tactile cues required            PT Short Term Goals - 06/10/20 1556      PT SHORT TERM GOAL #1   Title Patientwill transfer supine to sit  and sit to supine with supervision    Time 4    Period Weeks    Status New    Target Date 07/08/20      PT SHORT TERM GOAL #2   Title Patient will dmeonstrate full active hip flexion to 90 degrees    Time 4    Period Weeks    Status New    Target Date 07/08/20      PT SHORT TERM GOAL #3   Title Patient will ambaulte 300' without increased pain with good posture without cuing with LRAD    Time 4    Period Weeks    Status New    Target Date 07/08/20      PT SHORT TERM GOAL #4   Title Therapy will review FOTO    Baseline this continues to be a challange when she has pain, this makes her feel better.    Time 4    Period Weeks    Status New             PT Long Term Goals - 06/10/20 1601      PT LONG TERM GOAL #1   Title Patient will ambualte 3000' with LRAD without pain    Time 8    Period Weeks    Status New    Target Date 08/05/20      PT LONG TERM GOAL #2   Title Patient will demonstrate a 54 % on FOTO to demonstrate improved fucntion    Time 8    Period Weeks    Status New    Target Date 08/05/20      PT LONG TERM GOAL #3   Title Patient will transfer sit to stand without use of the hands without increased pain    Time 8    Period Weeks    Status New    Target Date 08/05/20                 Plan - 07/14/20 1204    Clinical Impression Statement Patient did well today. She had some pain standing. She had  less spasming in her hip and sacral area with manual therapy. sh eisalso using muscle relaxers which may be helping. Therapy will progress with cane training over the nextfew visits if weight bearing pain levels remain low. she has increased pain when she triesto stad up straighter. This may be exacerbating her stenosis.    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 Assess respose to soft tisseu mobilziation ogf her back    PT Home Exercise Plan has a good exercise program at home.    Consulted and Agree with Plan of Care Patient           Patient will benefit from skilled therapeutic intervention in order to improve the following deficits and impairments:  Abnormal gait,Cardiopulmonary status limiting activity,Decreased activity tolerance,Decreased endurance,Decreased range of motion,Decreased strength,Difficulty walking,Increased edema,Decreased balance,Decreased mobility,Impaired flexibility,Obesity,Increased muscle spasms,Pain,Improper body mechanics,Decreased knowledge of use of DME  Visit Diagnosis: Pain in left hip  Stiffness of left hip, not elsewhere classified  Difficulty in walking, not elsewhere classified  Muscle weakness (generalized)  Pain in right hip     Problem List Patient Active Problem List   Diagnosis Date Noted  . Neurogenic claudication due to lumbar spinal stenosis 05/28/2019  . Right groin pain 05/28/2019  . Polymyalgia rheumatica ([place]) 10/09/2018  . BMI  40.0-44.9, adult ([place]) 10/09/2018  . Foraminal stenosis of lumbar region 05/11/2018  . 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 On prednisone therapy 02/14/2018  . Herpes zoster 10/16/2017  . Chronic pain of both shoulders 08/10/2017  . Prediabetes 05/26/2017  . Primary osteoarthritis of both hips 03/15/2017  . Trochanteric bursitis of left hip 03/15/2017  . Recurrent epistaxis 12/09/2016  . Dysuria 11/03/2016  . Insomnia 11/03/2016  . Glaucoma 05/05/2016  . LPRD (laryngopharyngeal reflux disease) 01/26/2016  . Erosive esophagitis 11/27/2015  . Schatzki's ring of distal esophagus 11/27/2015  . Chronic sinusitis 10/16/2015  . Deviated nasal septum 10/16/2015  . Nasal polyp 10/16/2015  . Osteoporosis 08/15/2015  . Dysphagia, pharyngoesophageal phase 04/01/2015  . Endometrial polyp 10/24/2014  . Fatigue 07/09/2014  . Hyperlipidemia   . Breast cancer, right ([place]) 02/01/2014  . Malignant melanoma of arm ([place]) 11/23/2013  . Nasal septum ulceration 10/16/2013  . Seborrheic keratosis 10/16/2013  . Vitamin D deficiency 02/08/2013  . Sciatica of right side 01/24/2013  . Esophageal ulcer 01/24/2013  . Esophageal stenosis 01/24/2013  . Choroidal nevus of right eye 10/27/2012  . Impacted cerumen of right ear 10/11/2012  . Frequency 01/19/2012  . Gastroesophageal reflux disease 09/01/2011  . 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 H/O cataract extraction 04/30/2011  . Borderline glaucoma steroid responder 04/30/2011  . Class 2 obesity due to excess calories without serious comorbidity with body mass index (BMI) of 39.0 to 39.9 in adult 10/16/2008  . Breast cancer  of lower-outer quadrant of right female breast ([place]) 08/29/2008    Carney Living PT DPT  07/14/2020, 12:10 PM  [place], [place], [address] Phone: [phone]    Fax:  [phone]  Name: ERISHA PAUGH MRN: [id number] Date of Birth: 10-01-1945

## 2020-07-16 ENCOUNTER — Ambulatory Visit: Payer: Medicare Other | Admitting: Physical Therapy

## 2020-07-16 ENCOUNTER — Ambulatory Visit: Payer: Medicare Other | Admitting: Podiatry

## 2020-07-16 ENCOUNTER — Encounter: Payer: Self-pay | Admitting: Physical Therapy

## 2020-07-16 ENCOUNTER — Other Ambulatory Visit: Payer: Self-pay

## 2020-07-16 DIAGNOSIS — M25552 Pain in left hip: Secondary | ICD-10-CM

## 2020-07-16 DIAGNOSIS — M6281 Muscle weakness (generalized): Secondary | ICD-10-CM

## 2020-07-16 DIAGNOSIS — R262 Difficulty in walking, not elsewhere classified: Secondary | ICD-10-CM

## 2020-07-16 DIAGNOSIS — M25652 Stiffness of left hip, not elsewhere classified: Secondary | ICD-10-CM

## 2020-07-16 DIAGNOSIS — M25551 Pain in right hip: Secondary | ICD-10-CM | POA: Diagnosis not present

## 2020-07-17 ENCOUNTER — Ambulatory Visit
Admission: EM | Admit: 2020-07-17 | Discharge: 2020-07-17 | Disposition: A | Discharge status: Home / Self Care | Payer: Medicare Other

## 2020-07-17 DIAGNOSIS — Z85820 Personal history of malignant melanoma of skin: Secondary | ICD-10-CM | POA: Diagnosis not present

## 2020-07-17 DIAGNOSIS — Z23 Encounter for immunization: Secondary | ICD-10-CM | POA: Diagnosis not present

## 2020-07-17 DIAGNOSIS — L539 Erythematous condition, unspecified: Secondary | ICD-10-CM | POA: Diagnosis not present

## 2020-07-17 DIAGNOSIS — Z9889 Other specified postprocedural states: Secondary | ICD-10-CM | POA: Diagnosis not present

## 2020-07-17 DIAGNOSIS — Z9229 Personal history of other drug therapy: Secondary | ICD-10-CM | POA: Diagnosis not present

## 2020-07-17 DIAGNOSIS — R600 Localized edema: Secondary | ICD-10-CM | POA: Diagnosis not present

## 2020-07-17 DIAGNOSIS — Z853 Personal history of malignant neoplasm of breast: Secondary | ICD-10-CM | POA: Diagnosis not present

## 2020-07-17 DIAGNOSIS — M79662 Pain in left lower leg: Secondary | ICD-10-CM | POA: Diagnosis not present

## 2020-07-17 DIAGNOSIS — R921 Mammographic calcification found on diagnostic imaging of breast: Secondary | ICD-10-CM | POA: Diagnosis not present

## 2020-07-17 DIAGNOSIS — Z8739 Personal history of other diseases of the musculoskeletal system and connective tissue: Secondary | ICD-10-CM | POA: Diagnosis not present

## 2020-07-17 DIAGNOSIS — Z923 Personal history of irradiation: Secondary | ICD-10-CM | POA: Diagnosis not present

## 2020-07-17 DIAGNOSIS — N641 Fat necrosis of breast: Secondary | ICD-10-CM | POA: Diagnosis not present

## 2020-07-17 DIAGNOSIS — Z08 Encounter for follow-up examination after completed treatment for malignant neoplasm: Secondary | ICD-10-CM | POA: Diagnosis not present

## 2020-07-17 NOTE — Therapy (Signed)
[place], [place], [address] Phone: [phone]   Fax:  [phone]  Physical Therapy Treatment  Patient Details  Name: [name] MRN: [id number] Date of Birth: [birth year] Referring Provider (PT): Dr    Gareth Morgan Date: 07/16/2020   PT End of Session - 07/16/20 1202    Visit Number 9    Number of Visits 12    Date for PT Re-Evaluation 07/22/20    PT Start Time 7628    PT Stop Time 1227    PT Time Calculation (min) 39 min    Activity Tolerance Patient limited by fatigue;Patient tolerated treatment well    Behavior During Therapy [facility] for tasks assessed/performed           Past Medical History:  Diagnosis Date  . Asymptomatic varicose veins   . Cancer (HCC)    breast   . Diverticulosis   . DM type 2 (diabetes mellitus, type 2) ([place]) 10/23/2014  . Dysuria   . GERD (gastroesophageal reflux disease)   . Hiatal hernia   . Hyperlipidemia   . Malignant neoplasm of breast (female), unspecified site   . Migraine without aura, without mention of intractable migraine without mention of status migrainosus   . Other abnormal blood chemistry   . Pain in joint, pelvic region and thigh   . Shingles    in eyes  . Stricture and stenosis of esophagus   . Trigger finger (acquired)   . Unspecified cataract   . Unspecified glaucoma(365.9)     Past Surgical History:  Procedure Laterality Date  . APPENDECTOMY  1973   Dr. Linzie Collin  . BREAST LUMPECTOMY Right 08/29/2008   Dr. Erroll Luna  . ESOPHAGEAL DILATION    . NASAL POLYP EXCISION  10/2015  . POLYPECTOMY  11/20/14   Uterine Polyp Removed    There were no vitals filed for this visit.   Subjective Assessment - 07/16/20 1156    Subjective Patient reports her hip has been much better. She is having very little pain, but she is sleeping and moving muxh better.    Pertinent History DM, glaucoma, reports a myriad of other issues with her health, GERD     How long can you stand comfortably? unable to stand to do ADL's for more then a few minutes    How long can you walk comfortably? limited sidtances    Patient Stated Goals have less pain and walk better    Currently in Pain? Yes    Pain Score 1     Pain Location Hip    Pain Orientation Left    Pain Descriptors / Indicators Aching    Pain Type Chronic pain    Pain Onset More than a month ago    Pain Frequency Constant    Aggravating Factors  standing and walking    Pain Relieving Factors rest and pain meds    Effect of Pain on Daily Activities limited abiulity to perfrom ADL's                             [facility] Adult PT Treatment/Exercise - 07/17/20 0001      Ambulation/Gait   Ambulation/Gait Yes    Ambulation Distance (Feet) --   20'x1 40'x1   Assistive device Straight cane    Gait Pattern Step-to pattern    Gait Comments good stability noted on the left. As she fatigued she had more lateral  movement and less weight bearing on the left but reported no pain.      Lumbar Exercises: Aerobic   Nustep 6 min    Other Aerobic Exercise while monitoring motion      Lumbar Exercises: Standing   Heel Raises Limitations x20      Lumbar Exercises: Seated   LAQ on Chair Limitations x20   2lb   Other Seated Lumbar Exercises seated clamshells 3x10 red TB; bilateral ; hamstring curl red x20;      Knee/Hip Exercises: Standing   Hip Flexion AROM;Stengthening;Both;3 sets;10 reps    Hip Flexion Limitations in II bars      Modalities   Modalities Moist Heat      Moist Heat Therapy   Number Minutes Moist Heat 10 Minutes    Moist Heat Location Hip      Manual Therapy   Manual therapy comments trigger point release to lumbar spine, sacral area, and gluteal.                  PT Education - 07/16/20 1201    Education Details reviewed HEP and symptom management    Person(s) Educated Patient    Methods Explanation;Tactile cues;Verbal cues;Demonstration     Comprehension Verbalized understanding;Tactile cues required;Verbal cues required;Returned demonstration            PT Short Term Goals - 06/10/20 1556      PT SHORT TERM GOAL #1   Title Patientwill transfer supine to sit  and sit to supine with supervision    Time 4    Period Weeks    Status New    Target Date 07/08/20      PT SHORT TERM GOAL #2   Title Patient will dmeonstrate full active hip flexion to 90 degrees    Time 4    Period Weeks    Status New    Target Date 07/08/20      PT SHORT TERM GOAL #3   Title Patient will ambaulte 300' without increased pain with good posture without cuing with LRAD    Time 4    Period Weeks    Status New    Target Date 07/08/20      PT SHORT TERM GOAL #4   Title Therapy will review FOTO    Baseline this continues to be a challange when she has pain, this makes her feel better.    Time 4    Period Weeks    Status New             PT Long Term Goals - 06/10/20 1601      PT LONG TERM GOAL #1   Title Patient will ambualte 3000' with LRAD without pain    Time 8    Period Weeks    Status New    Target Date 08/05/20      PT LONG TERM GOAL #2   Title Patient will demonstrate a 54 % on FOTO to demonstrate improved fucntion    Time 8    Period Weeks    Status New    Target Date 08/05/20      PT LONG TERM GOAL #3   Title Patient will transfer sit to stand without use of the hands without increased pain    Time 8    Period Weeks    Status New    Target Date 08/05/20                 Plan -  07/17/20 0600    Clinical Impression Statement Significant improvement in weight bearing on the left side with gait. Patient ambualted with the cane 2x in the clinic. She reported no increase in pain. Her antalgic gait increases as she ambualtes. She still walks with a flexed trunk. When she tries to straighten she has the pain in her sacrum. Her gflexed trunk with gait may be baseline.    Comorbidities GERD, DM, right hip  replacement; bilateral LE edema    Examination-Activity Limitations Carry;Squat;Stairs;Dressing;Locomotion Level;Transfers;Bed Mobility    Examination-Participation Restrictions Meal Prep;Cleaning;Community Activity;Laundry;Yard Work    [employer] Evolving/Moderate complexity    Clinical Decision Making Moderate    Rehab Potential Good    PT Frequency 2x / week    PT Duration 8 weeks    PT Treatment/Interventions ADLs/Self Care Home Management;Cryotherapy;Electrical Stimulation;Iontophoresis 4mg /ml Dexamethasone;Moist Heat;Therapeutic activities;Functional mobility training;Gait training;Therapeutic exercise;Balance training;Neuromuscular re-education;Patient/family education;Dry needling;Ultrasound;Manual techniques;Taping    PT Next Visit Plan progress standing activity; contoinue with manual therapy if needed    PT Home Exercise Plan has a good exercise program at home.    Consulted and Agree with Plan of Care Patient           Patient will benefit from skilled therapeutic intervention in order to improve the following deficits and impairments:  Abnormal gait,Cardiopulmonary status limiting activity,Decreased activity tolerance,Decreased endurance,Decreased range of motion,Decreased strength,Difficulty walking,Increased edema,Decreased balance,Decreased mobility,Impaired flexibility,Obesity,Increased muscle spasms,Pain,Improper body mechanics,Decreased knowledge of use of DME  Visit Diagnosis: Pain in left hip  Stiffness of left hip, not elsewhere classified  Difficulty in walking, not elsewhere classified  Muscle weakness (generalized)     Problem List Patient Active Problem List   Diagnosis Date Noted  . Neurogenic claudication due to lumbar spinal stenosis 05/28/2019  . Right groin pain 05/28/2019  . Polymyalgia rheumatica ([place]) 10/09/2018  . BMI 40.0-44.9, adult ([place]) 10/09/2018  . Foraminal stenosis of lumbar region 05/11/2018  . Spinal stenosis  of lumbar region at multiple levels 05/11/2018  . On prednisone therapy 02/14/2018  . Herpes zoster 10/16/2017  . Chronic pain of both shoulders 08/10/2017  . Prediabetes 05/26/2017  . Primary osteoarthritis of both hips 03/15/2017  . Trochanteric bursitis of left hip 03/15/2017  . Recurrent epistaxis 12/09/2016  . Dysuria 11/03/2016  . Insomnia 11/03/2016  . Glaucoma 05/05/2016  . LPRD (laryngopharyngeal reflux disease) 01/26/2016  . Erosive esophagitis 11/27/2015  . Schatzki's ring of distal esophagus 11/27/2015  . Chronic sinusitis 10/16/2015  . Deviated nasal septum 10/16/2015  . Nasal polyp 10/16/2015  . Osteoporosis 08/15/2015  . Dysphagia, pharyngoesophageal phase 04/01/2015  . Endometrial polyp 10/24/2014  . Fatigue 07/09/2014  . Hyperlipidemia   . Breast cancer, right ([place]) 02/01/2014  . Malignant melanoma of arm ([place]) 11/23/2013  . Nasal septum ulceration 10/16/2013  . Seborrheic keratosis 10/16/2013  . Vitamin D deficiency 02/08/2013  . Sciatica of right side 01/24/2013  . Esophageal ulcer 01/24/2013  . Esophageal stenosis 01/24/2013  . Choroidal nevus of right eye 10/27/2012  . Impacted cerumen of right ear 10/11/2012  . Frequency 01/19/2012  . Gastroesophageal reflux disease 09/01/2011  . 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 H/O cataract extraction 04/30/2011  . Borderline glaucoma steroid responder 04/30/2011  . Class 2 obesity due to excess calories without serious comorbidity with body mass index (BMI) of 39.0 to 39.9 in adult 10/16/2008  . Breast cancer of lower-outer quadrant of right female breast ([place]) 08/29/2008    Grayling Congress  Kayleen Memos PT DPT 07/17/2020, 6:09 AM  [facility] [address] [place], [place], [address] Phone: [phone]   Fax:  [phone]  Name: [name] MRN: [id number] Date of Birth: [birth year]

## 2020-07-18 DIAGNOSIS — R2242 Localized swelling, mass and lump, left lower limb: Secondary | ICD-10-CM | POA: Diagnosis not present

## 2020-07-18 DIAGNOSIS — M791 Myalgia, unspecified site: Secondary | ICD-10-CM | POA: Diagnosis not present

## 2020-07-18 DIAGNOSIS — R7989 Other specified abnormal findings of blood chemistry: Secondary | ICD-10-CM | POA: Diagnosis not present

## 2020-07-21 ENCOUNTER — Telehealth: Payer: Self-pay | Admitting: *Deleted

## 2020-07-21 ENCOUNTER — Ambulatory Visit: Payer: Medicare Other | Admitting: Physical Therapy

## 2020-07-21 ENCOUNTER — Other Ambulatory Visit: Payer: Self-pay

## 2020-07-21 ENCOUNTER — Ambulatory Visit (HOSPITAL_COMMUNITY)
Admission: RE | Admit: 2020-07-21 | Discharge: 2020-07-21 | Disposition: A | Discharge status: Home / Self Care | Payer: Medicare Other | Source: Ambulatory Visit | Attending: Internal Medicine | Admitting: Internal Medicine

## 2020-07-21 ENCOUNTER — Ambulatory Visit (INDEPENDENT_AMBULATORY_CARE_PROVIDER_SITE_OTHER): Payer: Medicare Other | Admitting: Family

## 2020-07-21 ENCOUNTER — Encounter: Payer: Self-pay | Admitting: Physical Therapy

## 2020-07-21 ENCOUNTER — Encounter: Payer: Self-pay | Admitting: Family

## 2020-07-21 VITALS — BP 138/80 | HR 91 | Temp 97.1°F | Resp 18 | Ht 64.0 in | Wt 260.0 lb

## 2020-07-21 DIAGNOSIS — M79662 Pain in left lower leg: Secondary | ICD-10-CM | POA: Diagnosis not present

## 2020-07-21 DIAGNOSIS — I83813 Varicose veins of bilateral lower extremities with pain: Secondary | ICD-10-CM | POA: Diagnosis not present

## 2020-07-21 DIAGNOSIS — M7989 Other specified soft tissue disorders: Secondary | ICD-10-CM

## 2020-07-21 DIAGNOSIS — M25552 Pain in left hip: Secondary | ICD-10-CM

## 2020-07-21 DIAGNOSIS — M25652 Stiffness of left hip, not elsewhere classified: Secondary | ICD-10-CM

## 2020-07-21 DIAGNOSIS — R262 Difficulty in walking, not elsewhere classified: Secondary | ICD-10-CM

## 2020-07-21 DIAGNOSIS — M79661 Pain in right lower leg: Secondary | ICD-10-CM | POA: Insufficient documentation

## 2020-07-21 NOTE — Telephone Encounter (Signed)
Darl Pikes with [facility] called with Preliminary Report to R/O DVT Left Leg Results was NEGATIVE.   Will be faxing Final Results.

## 2020-07-21 NOTE — Progress Notes (Signed)
Provider: Donnae Michels FNP-C  Gayland Curry, DO  Patient Care Team: Gayland Curry, DO as PCP - General (Geriatric Medicine) Magrinat, Virgie Dad, MD as Consulting Physician (Oncology) Erroll Luna, MD as Consulting Physician (General Surgery) Harvie Heck, MD as Physician Assistant (Internal Medicine) Chyrel Masson, DO as Anesthesiologist (Otolaryngology) Clinger, Chrystie Nose, MD (Otolaryngology) Turner Daniels, MD as Referring Physician (Internal Medicine)  Extended Emergency Contact Information Primary Emergency Contact: Pe,Terry Address: [place], [place] Phone: [phone] Work Phone: [phone] Mobile Phone: [phone] Relation: Son  Code Status: Full Code  Goals of care: Advanced Directive information Advanced Directives 07/21/2020  Does Patient Have a Medical Advance Directive? No  Would patient like information on creating a medical advance directive? No - Patient declined     Chief Complaint  Patient presents with   Acute Visit    Left leg swelling possible clot.    HPI:  Pt is a 74 y.o. female seen today for an acute visit for evaluation of left leg swelling and pain.States went for her follow up check up appointment on 07/17/2020 at [facility] for breast cancer  left leg was swollen tech pressed on the leg.she had pain when leg was pressed.She was advised to go to ED for possible DVT. She went to ED 07/17/20 and 07/18/2020 but waiting time was 12 hours so she went home.States could not stay until late due to her vision problems cannot see at night to drive her self home.she Also went for Physical Therapy but was advised to see Provider for left leg swelling and pain prior to continuing with Therapy.  Has had redness on the top of the foot.Also states leg was more swollen yesterday.she denies any cough,chest tightness,chest pain,palpitation or shortness of breath. Has had no fever or chills.Has had  no abrupt weight gain.  Past Medical History:  Diagnosis Date   Asymptomatic varicose veins    Cancer ([place])    breast    Diverticulosis    DM type 2 (diabetes mellitus, type 2) ([place]) 10/23/2014   Dysuria    GERD (gastroesophageal reflux disease)    Hiatal hernia    Hyperlipidemia    Malignant neoplasm of breast (female), unspecified site    Migraine without aura, without mention of intractable migraine without mention of status migrainosus    Other abnormal blood chemistry    Pain in joint, pelvic region and thigh    Shingles    in eyes   Stricture and stenosis of esophagus    Trigger finger (acquired)    Unspecified cataract    Unspecified glaucoma(365.9)    Past Surgical History:  Procedure Laterality Date   APPENDECTOMY  1973   Dr. Linzie Collin   BREAST LUMPECTOMY Right 08/29/2008   Dr. Marcello Moores [place]   ESOPHAGEAL DILATION     NASAL POLYP EXCISION  10/2015   POLYPECTOMY  11/20/14   Uterine Polyp Removed    Allergies  Allergen Reactions   Cymbalta [Duloxetine Hcl] Other (See Comments)    sleepiness   Gabapentin Other (See Comments)    sleepiness   Adhesive [Tape] Dermatitis   Betimol [Timolol Maleate]     Allergic to preservatives    Cosopt [Dorzolamide Hcl-Timolol Mal] Other (See Comments)    Turns eye red, increases eye pressure   Penicillins    Prednisolone Acetate Other (See Comments)    Caused increased eye pressure   Tamoxifen Other (  See Comments)    Vaginal bleeding   Thimerosal     Makes pt eye red    Iodine-131 Rash    Dye from PET scan only, states has had since and did ok    Outpatient Encounter Medications as of 07/21/2020  Medication Sig   Ascorbic Acid (VITAMIN C) 1000 MG tablet Take 1,000 mg by mouth daily.   Calcium Citrate-Vitamin D 250-200 MG-UNIT TABS Take 500 mg by mouth daily.    Cholecalciferol (VITAMIN D-3) 5000 units TABS Take by mouth daily.   Cyanocobalamin 5000 MCG CAPS cyanocobalamin (vitamin  B-12) 5,000 mcg capsule  Take 1 capsule every day by oral route.   fluorometholone (FML) 0.1 % ophthalmic suspension INSTILL ONE DROP IN RIGHT EYE DAILY   HYDROcodone-acetaminophen (NORCO) 10-325 MG tablet Take 1 tablet by mouth every 6 (six) hours as needed for severe pain.   omeprazole (PRILOSEC) 20 MG capsule Take 20 mg by mouth every other day.    omeprazole (PRILOSEC) 40 MG capsule Take 40 mg by mouth every other day.    OVER THE COUNTER MEDICATION 1 capsule 3 (three) times daily. Balance of [employer] and Fruit.   predniSONE (DELTASONE) 5 MG tablet Take 5 mg by mouth in the morning and at bedtime.   timolol (TIMOPTIC) 0.5 % ophthalmic solution Place 1 drop into the right eye 2 times daily.   valACYclovir (VALTREX) 1000 MG tablet Take 1,000 mg by mouth every other day.    zinc gluconate 50 MG tablet Take by mouth.   [DISCONTINUED] diclofenac (VOLTAREN) 75 MG EC tablet Take 75 mg by mouth 2 (two) times daily.   [DISCONTINUED] diclofenac Sodium (VOLTAREN) 1 % GEL    [DISCONTINUED] LORazepam (ATIVAN) 1 MG tablet Take 1 tablet (1 mg total) by mouth at bedtime as needed for anxiety.   [DISCONTINUED] Multiple Vitamin (MULTIVITAMIN) capsule Take 1 capsule by mouth daily.   No facility-administered encounter medications on file as of 07/21/2020.    Review of Systems  Constitutional: Negative for chills, fatigue and fever.  Respiratory: Negative for cough, chest tightness, shortness of breath and wheezing.   Cardiovascular: Positive for leg swelling. Negative for chest pain and palpitations.  Gastrointestinal: Negative for abdominal distention, abdominal pain, nausea and vomiting.  Musculoskeletal: Positive for arthralgias and gait problem.  Skin: Negative for color change, pallor and rash.  Neurological: Negative for dizziness, speech difficulty, weakness, light-headedness, numbness and headaches.    Immunization History  Administered Date(s) Administered   Influenza  Whole 07/26/2010   Influenza, High Dose Seasonal PF 05/03/2019, 07/17/2020   Influenza,inj,Quad PF,6+ Mos 04/24/2014, 04/01/2015, 05/05/2016   Influenza-Unspecified 06/30/2010, 04/25/2013, 05/31/2018   Pneumococcal Conjugate-13 10/23/2014   Pneumococcal Polysaccharide-23 10/29/2016   Td 06/24/2009   Tdap 07/26/2008   Zoster 10/17/2013   Pertinent  Health Maintenance Due  Topic Date Due   COLONOSCOPY (Pts 45-25yrs Insurance coverage will need to be confirmed)  03/26/2019   URINE MICROALBUMIN  10/09/2019   INFLUENZA VACCINE  Completed   DEXA SCAN  Completed   PNA vac Low Risk Adult  Completed   Fall Risk  07/21/2020 03/13/2020 01/22/2020 11/23/2019 06/25/2019  Falls in the past year? 0 1 1 1  0  Number falls in past yr: 0 1 0 0 -  Injury with Fall? 0 1 1 1  0  Comment - hurt tail bone - - -   Functional Status Survey:    Vitals:   07/21/20 1311  BP: 138/80  Pulse: 91  Resp: 18  Temp: (!) 97.1 F (36.2 C)  SpO2: 97%  Weight: 260 lb (117.9 kg)  Height: 5\' 4"  (1.626 m)   Body mass index is 44.63 kg/m. Physical Exam Vitals reviewed.  Constitutional:      General: She is not in acute distress.    Appearance: She is not ill-appearing.  HENT:     Head: Normocephalic.  Cardiovascular:     Rate and Rhythm: Normal rate and regular rhythm.     Pulses: Normal pulses.     Heart sounds: Normal heart sounds. No murmur heard. No friction rub. No gallop.      Comments: Negative calf tenderness.left lateral leg tenderness.multiple varicose vein noted.  Pulmonary:     Effort: Pulmonary effort is normal. No respiratory distress.     Breath sounds: Normal breath sounds. No wheezing, rhonchi or rales.  Chest:     Chest wall: No tenderness.  Abdominal:     General: Bowel sounds are normal. There is no distension.     Palpations: Abdomen is soft. There is no mass.     Tenderness: There is no abdominal tenderness. There is no right CVA tenderness, left CVA tenderness,  guarding or rebound.  Musculoskeletal:        General: No swelling.     Right lower leg: Tenderness present. Edema present.     Left lower leg: Edema present.     Comments: Unsteady gait ambulates with walker.   Skin:    General: Skin is warm and dry.     Coloration: Skin is not pale.     Findings: No bruising or erythema.  Neurological:     Mental Status: She is alert and oriented to person, place, and time.     Cranial Nerves: No cranial nerve deficit.     Motor: No weakness.     Gait: Gait abnormal.  Psychiatric:        Mood and Affect: Mood normal.        Behavior: Behavior normal.        Thought Content: Thought content normal.        Judgment: Judgment normal.     Labs reviewed: Recent Labs    11/23/19 1424 03/13/20 1405  NA 139 138  K 4.1 4.5  CL 102 101  CO2 27 25  GLUCOSE 112* 116  BUN 18 19  CREATININE 0.61 0.63  CALCIUM 9.9 10.0   Recent Labs    11/23/19 1424  AST 14  ALT 13  BILITOT 0.5  PROT 6.5   Recent Labs    11/23/19 1424 03/13/20 1405  WBC 6.7 8.6  NEUTROABS 4,241 7,164  HGB 12.9 12.8  HCT 37.8 38.7  MCV 98.2 93.7  PLT 258 271   No results found for: TSH Lab Results  Component Value Date   HGBA1C 5.9 (H) 03/13/2020   Lab Results  Component Value Date   CHOL 227 (H) 11/23/2019   HDL 63 11/23/2019   LDLCALC 133 (H) 11/23/2019   TRIG 171 (H) 11/23/2019   CHOLHDL 3.6 11/23/2019    Significant Diagnostic Results in last 30 days:  No results found.  Assessment/Plan   Pain and swelling of left lower leg Reports swelling on left leg worst yesterday with and red but swelling has improved today.Left leg shin and later leg tenderness to palpation. Exam finding negative for cough,chest pain,palpitation,N/V or shortness of breath. - VAS [place] LOWER EXTREMITY VENOUS (DVT); Future  Family/ staff Communication: Reviewed plan of care with patient verbalized understanding.  Labs/tests ordered: - VAS [place] LOWER EXTREMITY VENOUS (DVT);  Future  Next Appointment: As needed if symptoms worsen or fail to improve .  Sandrea Hughs, NP

## 2020-07-21 NOTE — Patient Instructions (Signed)
-   Please get left leg Vascular Ultrasound done then will call you with results. - Notify provider or go to ED  for any cough,chest tightness,chest pain or shortness of breath. Any redness or leg feels warmer than right leg.

## 2020-07-22 NOTE — Therapy (Signed)
[place], [place], [address] Phone: [phone]   Fax:  [phone]  Physical Therapy Treatment  Patient Details  Name: [name] MRN: [id number] Date of Birth: August 08, 1945 Referring Provider (PT): Dr    Gareth Morgan Date: 07/21/2020   PT End of Session - 07/21/20 1528    Visit Number 10    Number of Visits 12    Date for PT Re-Evaluation 07/22/20    PT Start Time 7858    PT Stop Time 1155    PT Time Calculation (min) 10 min    Activity Tolerance Patient limited by fatigue;Patient tolerated treatment well    Behavior During Therapy [facility] for tasks assessed/performed           Past Medical History:  Diagnosis Date  . Asymptomatic varicose veins   . Cancer (HCC)    breast   . Diverticulosis   . DM type 2 (diabetes mellitus, type 2) ([place]) 10/23/2014  . Dysuria   . GERD (gastroesophageal reflux disease)   . Hiatal hernia   . Hyperlipidemia   . Malignant neoplasm of breast (female), unspecified site   . Migraine without aura, without mention of intractable migraine without mention of status migrainosus   . Other abnormal blood chemistry   . Pain in joint, pelvic region and thigh   . Shingles    in eyes  . Stricture and stenosis of esophagus   . Trigger finger (acquired)   . Unspecified cataract   . Unspecified glaucoma(365.9)     Past Surgical History:  Procedure Laterality Date  . APPENDECTOMY  1973   Dr. Linzie Collin  . BREAST LUMPECTOMY Right 08/29/2008   Dr. Erroll Luna  . ESOPHAGEAL DILATION    . NASAL POLYP EXCISION  10/2015  . POLYPECTOMY  11/20/14   Uterine Polyp Removed    There were no vitals filed for this visit.   Subjective Assessment - 07/21/20 1200    Subjective Patient comes in today reporteing that she has been having some medial calf swelling that has gone into her naterior calf. She has tenderness in her anterior lower leg. She was advised to get a doppler. She has had  difficulty finiding a place to get to a doppler. Unfortunelty if she has been advised to have a doppler then we will need a doppler to resume.    Pertinent History DM, glaucoma, reports a myriad of other issues with her health, GERD    How long can you stand comfortably? unable to stand to do ADL's for more then a few minutes    How long can you walk comfortably? limited sidtances    Patient Stated Goals have less pain and walk better    Currently in Pain? No/denies   no hip pain today.                                    PT Education - 07/21/20 1528    Education Details potential complaications of exercising with a DVT    Person(s) Educated Patient    Methods Explanation;Demonstration;Tactile cues;Verbal cues    Comprehension Returned demonstration;Verbal cues required;Verbalized understanding;Tactile cues required            PT Short Term Goals - 06/10/20 1556      PT SHORT TERM GOAL #1   Title Patientwill transfer supine to sit  and sit to supine with supervision  Time 4    Period Weeks    Status New    Target Date 07/08/20      PT SHORT TERM GOAL #2   Title Patient will dmeonstrate full active hip flexion to 90 degrees    Time 4    Period Weeks    Status New    Target Date 07/08/20      PT SHORT TERM GOAL #3   Title Patient will ambaulte 300' without increased pain with good posture without cuing with LRAD    Time 4    Period Weeks    Status New    Target Date 07/08/20      PT SHORT TERM GOAL #4   Title Therapy will review FOTO    Baseline this continues to be a challange when she has pain, this makes her feel better.    Time 4    Period Weeks    Status New             PT Long Term Goals - 06/10/20 1601      PT LONG TERM GOAL #1   Title Patient will ambualte 3000' with LRAD without pain    Time 8    Period Weeks    Status New    Target Date 08/05/20      PT LONG TERM GOAL #2   Title Patient will demonstrate a 54 % on FOTO  to demonstrate improved fucntion    Time 8    Period Weeks    Status New    Target Date 08/05/20      PT LONG TERM GOAL #3   Title Patient will transfer sit to stand without use of the hands without increased pain    Time 8    Period Weeks    Status New    Target Date 08/05/20                 Plan - 07/21/20 1206    Clinical Impression Statement See above. Treatment deffered toiday 2nd to a potential DVT.    Comorbidities GERD, DM, right hip replacement; bilateral LE edema    Examination-Activity Limitations Carry;Squat;Stairs;Dressing;Locomotion Level;Transfers;Bed Mobility    Examination-Participation Restrictions Meal Prep;Cleaning;Community Activity;Laundry;Yard Work    [employer] Evolving/Moderate complexity    Clinical Decision Making Moderate    Rehab Potential Good    PT Frequency 2x / week    PT Duration 8 weeks    PT Treatment/Interventions ADLs/Self Care Home Management;Cryotherapy;Electrical Stimulation;Iontophoresis 4mg /ml Dexamethasone;Moist Heat;Therapeutic activities;Functional mobility training;Gait training;Therapeutic exercise;Balance training;Neuromuscular re-education;Patient/family education;Dry needling;Ultrasound;Manual techniques;Taping    PT Next Visit Plan progress standing activity; contoinue with manual therapy if needed    PT Home Exercise Plan has a good exercise program at home.    Consulted and Agree with Plan of Care Patient           Patient will benefit from skilled therapeutic intervention in order to improve the following deficits and impairments:  Abnormal gait,Cardiopulmonary status limiting activity,Decreased activity tolerance,Decreased endurance,Decreased range of motion,Decreased strength,Difficulty walking,Increased edema,Decreased balance,Decreased mobility,Impaired flexibility,Obesity,Increased muscle spasms,Pain,Improper body mechanics,Decreased knowledge of use of DME  Visit Diagnosis: Pain in left  hip  Stiffness of left hip, not elsewhere classified  Difficulty in walking, not elsewhere classified     Problem List Patient Active Problem List   Diagnosis Date Noted  . Pain and swelling of right lower leg 07/21/2020  . Neurogenic claudication due to lumbar spinal stenosis 05/28/2019  . Right groin pain 05/28/2019  .  Polymyalgia rheumatica ([place]) 10/09/2018  . BMI 40.0-44.9, adult ([place]) 10/09/2018  . Foraminal stenosis of lumbar region 05/11/2018  . 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 On prednisone therapy 02/14/2018  . Herpes zoster 10/16/2017  . Chronic pain of both shoulders 08/10/2017  . Prediabetes 05/26/2017  . Primary osteoarthritis of both hips 03/15/2017  . Trochanteric bursitis of left hip 03/15/2017  . Recurrent epistaxis 12/09/2016  . Dysuria 11/03/2016  . Insomnia 11/03/2016  . Glaucoma 05/05/2016  . LPRD (laryngopharyngeal reflux disease) 01/26/2016  . Erosive esophagitis 11/27/2015  . Schatzki's ring of distal esophagus 11/27/2015  . Chronic sinusitis 10/16/2015  . Deviated nasal septum 10/16/2015  . Nasal polyp 10/16/2015  . Osteoporosis 08/15/2015  . Dysphagia, pharyngoesophageal phase 04/01/2015  . Endometrial polyp 10/24/2014  . Fatigue 07/09/2014  . Hyperlipidemia   . Breast cancer, right ([place]) 02/01/2014  . Malignant melanoma of arm ([place]) 11/23/2013  . Nasal septum ulceration 10/16/2013  . Seborrheic keratosis 10/16/2013  . Vitamin D deficiency 02/08/2013  . Sciatica of right side 01/24/2013  . Esophageal ulcer 01/24/2013  . Esophageal stenosis 01/24/2013  . Choroidal nevus of right eye 10/27/2012  . Impacted cerumen of right ear 10/11/2012  . Frequency 01/19/2012  . Gastroesophageal reflux disease 09/01/2011  . 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 H/O cataract extraction 04/30/2011  . Borderline glaucoma steroid responder 04/30/2011  . Class 2  obesity due to excess calories without serious comorbidity with body mass index (BMI) of 39.0 to 39.9 in adult 10/16/2008  . Breast cancer of lower-outer quadrant of right female breast ([place]) 08/29/2008    Carney Living PT DPT  07/22/2020, 7:58 AM  [facility] [address] [place], [place], [address] Phone: [phone]   Fax:  [phone]  Name: [name] MRN: [id number] Date of Birth: [birth year]

## 2020-07-23 ENCOUNTER — Emergency Department (HOSPITAL_COMMUNITY)
Admission: EM | Admit: 2020-07-23 | Discharge: 2020-07-23 | Disposition: A | Discharge status: Home / Self Care | Payer: Medicare Other | Attending: Emergency Medicine | Admitting: Emergency Medicine

## 2020-07-23 ENCOUNTER — Encounter (HOSPITAL_COMMUNITY): Payer: Self-pay | Admitting: Emergency Medicine

## 2020-07-23 ENCOUNTER — Ambulatory Visit: Payer: Medicare Other | Admitting: Physical Therapy

## 2020-07-23 ENCOUNTER — Emergency Department (HOSPITAL_COMMUNITY): Payer: Medicare Other

## 2020-07-23 ENCOUNTER — Other Ambulatory Visit: Payer: Self-pay

## 2020-07-23 DIAGNOSIS — E119 Type 2 diabetes mellitus without complications: Secondary | ICD-10-CM | POA: Insufficient documentation

## 2020-07-23 DIAGNOSIS — Z853 Personal history of malignant neoplasm of breast: Secondary | ICD-10-CM | POA: Diagnosis not present

## 2020-07-23 DIAGNOSIS — R079 Chest pain, unspecified: Secondary | ICD-10-CM | POA: Diagnosis not present

## 2020-07-23 DIAGNOSIS — R072 Precordial pain: Secondary | ICD-10-CM

## 2020-07-23 DIAGNOSIS — I2510 Atherosclerotic heart disease of native coronary artery without angina pectoris: Secondary | ICD-10-CM | POA: Diagnosis not present

## 2020-07-23 DIAGNOSIS — R110 Nausea: Secondary | ICD-10-CM | POA: Diagnosis not present

## 2020-07-23 DIAGNOSIS — R1310 Dysphagia, unspecified: Secondary | ICD-10-CM | POA: Diagnosis not present

## 2020-07-23 DIAGNOSIS — K449 Diaphragmatic hernia without obstruction or gangrene: Secondary | ICD-10-CM | POA: Diagnosis not present

## 2020-07-23 DIAGNOSIS — R000 Tachycardia, unspecified: Secondary | ICD-10-CM | POA: Diagnosis not present

## 2020-07-23 DIAGNOSIS — R0789 Other chest pain: Secondary | ICD-10-CM | POA: Diagnosis not present

## 2020-07-23 DIAGNOSIS — K5730 Diverticulosis of large intestine without perforation or abscess without bleeding: Secondary | ICD-10-CM | POA: Diagnosis not present

## 2020-07-23 DIAGNOSIS — I10 Essential (primary) hypertension: Secondary | ICD-10-CM | POA: Diagnosis not present

## 2020-07-23 LAB — CBC
HCT: 42.9 % (ref 36.0–46.0)
Hemoglobin: 13.1 g/dL (ref 12.0–15.0)
MCH: 29.7 pg (ref 26.0–34.0)
MCHC: 30.5 g/dL (ref 30.0–36.0)
MCV: 97.3 fL (ref 80.0–100.0)
Platelets: 294 10*3/uL (ref 150–400)
RBC: 4.41 MIL/uL (ref 3.87–5.11)
RDW: 12.9 % (ref 11.5–15.5)
WBC: 9.9 10*3/uL (ref 4.0–10.5)
nRBC: 0 % (ref 0.0–0.2)

## 2020-07-23 LAB — TROPONIN I (HIGH SENSITIVITY)
Troponin I (High Sensitivity): 9 ng/L (ref <18)
Troponin I (High Sensitivity): 10 ng/L (ref <18)

## 2020-07-23 LAB — BASIC METABOLIC PANEL
Anion gap: 11 (ref 5–15)
BUN: 12 mg/dL (ref 8–23)
CO2: 28 mmol/L (ref 22–32)
Calcium: 9.8 mg/dL (ref 8.9–10.3)
Chloride: 98 mmol/L (ref 98–111)
Creatinine, Ser: 0.63 mg/dL (ref 0.44–1.00)
GFR, Estimated: >60 mL/min (ref >60)
Glucose, Bld: 120 mg/dL — ABNORMAL HIGH (ref 70–99)
Potassium: 3.8 mmol/L (ref 3.5–5.1)
Sodium: 137 mmol/L (ref 135–145)

## 2020-07-23 MED ORDER — ONDANSETRON HCL 4 MG/2ML IJ SOLN
4.0000 mg | Freq: Once | INTRAMUSCULAR | Status: AC
  Administered 2020-07-23: 4 mg via INTRAVENOUS
  Filled 2020-07-23: qty 2

## 2020-07-23 MED ORDER — ONDANSETRON 4 MG PO TBDP
4.0000 mg | ORAL_TABLET | Freq: Once | ORAL | Status: DC | PRN
  Filled 2020-07-23: qty 1

## 2020-07-23 MED ORDER — HYDROMORPHONE HCL 1 MG/ML IJ SOLN
0.5000 mg | Freq: Once | INTRAMUSCULAR | Status: AC
  Administered 2020-07-23: 0.5 mg via INTRAVENOUS
  Filled 2020-07-23: qty 1

## 2020-07-23 MED ORDER — IOHEXOL 350 MG/ML SOLN
100.0000 mL | Freq: Once | INTRAVENOUS | Status: AC | PRN
  Administered 2020-07-23: 100 mL via INTRAVENOUS

## 2020-07-23 NOTE — ED Triage Notes (Signed)
Pt BIB [facility] from home, states that she began to feel like she was having trouble swallowing when she developed chest pain, hx having to have her esophagus stretched, but states this pain feels different. C/o nausea as well. Given 324mg  asa pta.

## 2020-07-23 NOTE — ED Notes (Signed)
Called patient for vital update patient didn't answer will call later

## 2020-07-23 NOTE — ED Notes (Signed)
Patient verbalizes understanding of discharge instructions. Opportunity for questioning and answers were provided. Armband removed by staff, pt discharged from ED via wheelchair to the lobby to wait on a ride.

## 2020-07-23 NOTE — Discharge Instructions (Addendum)
It was our pleasure to provide your ER care today - we hope that you feel better.  For chest discomfort, follow up with cardiologist in the coming week - call office to arrange appointment.   For gi symptoms, follow up with your gi doctor in the next 1-2 weeks - call office to arrange appointment.   Return to ER right away if worse, new symptoms, fevers, esophageal foreign body sensation or unable to swallow, persistent vomiting, recurrent or persistent chest pain, trouble breathing, or other concern.   You were given pain medication in the ER - no driving for the next 6 hours.

## 2020-07-23 NOTE — ED Provider Notes (Addendum)
[facility] EMERGENCY DEPARTMENT Provider Note   CSN: [id number] Arrival date & time: 07/23/20  0058     History Chief Complaint  Patient presents with  . Chest Pain    [name] is a 74 y.o. female.  Patient c/o mid chest pain onset last pm. C/o acute onset, mid chest pain, at rest, was dull/severe at onset, at times felt radiating to back. +nausea. No sob. No diaphoresis. Denies hx cad. Does indicate worried as family hx aortic disease ?aneurysm or dissection. Pt also notes hx esophageal stricture ?web, but says this felt different, and that she never had severe chest pain with that issue. Pt indicates last night, was having sensation as if trouble swallowing, and did spit out a small amount of liquid when trying to swallow - pt indicates since this AM is able to swallow liquids and saliva without difficulty. Pt denies any exertional cp or discomfort. No unusual doe or fatigue. No pleuritic pain. States recently had u/s LLE negative for dvt. ?hx gerd, but states this chest pain is entirely different. Denies cough or uri symptoms. No fever or chills. No abd pain.   The history is provided by the patient and the EMS personnel.  Chest Pain Associated symptoms: nausea   Associated symptoms: no abdominal pain, no cough, no fever, no headache, no palpitations and no shortness of breath        Past Medical History:  Diagnosis Date  . Asymptomatic varicose veins   . Cancer (HCC)    breast   . Diverticulosis   . DM type 2 (diabetes mellitus, type 2) ([place]) 10/23/2014  . Dysuria   . GERD (gastroesophageal reflux disease)   . Hiatal hernia   . Hyperlipidemia   . Malignant neoplasm of breast (female), unspecified site   . Migraine without aura, without mention of intractable migraine without mention of status migrainosus   . Other abnormal blood chemistry   . Pain in joint, pelvic region and thigh   . Shingles    in eyes  . Stricture and stenosis of esophagus    . Trigger finger (acquired)   . Unspecified cataract   . Unspecified glaucoma(365.9)     Patient Active Problem List   Diagnosis Date Noted  . Pain and swelling of right lower leg 07/21/2020  . Neurogenic claudication due to lumbar spinal stenosis 05/28/2019  . Right groin pain 05/28/2019  . Polymyalgia rheumatica ([place]) 10/09/2018  . BMI 40.0-44.9, adult ([place]) 10/09/2018  . Foraminal stenosis of lumbar region 05/11/2018  . 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 On prednisone therapy 02/14/2018  . Herpes zoster 10/16/2017  . Chronic pain of both shoulders 08/10/2017  . Prediabetes 05/26/2017  . Primary osteoarthritis of both hips 03/15/2017  . Trochanteric bursitis of left hip 03/15/2017  . Recurrent epistaxis 12/09/2016  . Dysuria 11/03/2016  . Insomnia 11/03/2016  . Glaucoma 05/05/2016  . LPRD (laryngopharyngeal reflux disease) 01/26/2016  . Erosive esophagitis 11/27/2015  . Schatzki's ring of distal esophagus 11/27/2015  . Chronic sinusitis 10/16/2015  . Deviated nasal septum 10/16/2015  . Nasal polyp 10/16/2015  . Osteoporosis 08/15/2015  . Dysphagia, pharyngoesophageal phase 04/01/2015  . Endometrial polyp 10/24/2014  . Fatigue 07/09/2014  . Hyperlipidemia   . Breast cancer, right ([place]) 02/01/2014  . Malignant melanoma of arm ([place]) 11/23/2013  . Nasal septum ulceration 10/16/2013  . Seborrheic keratosis 10/16/2013  . Vitamin D deficiency 02/08/2013  . Sciatica of right side 01/24/2013  .  Esophageal ulcer 01/24/2013  . Esophageal stenosis 01/24/2013  . Choroidal nevus of right eye 10/27/2012  . Impacted cerumen of right ear 10/11/2012  . Frequency 01/19/2012  . Gastroesophageal reflux disease 09/01/2011  . 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 H/O cataract extraction 04/30/2011  . Borderline glaucoma steroid responder 04/30/2011  . Class 2 obesity due to excess calories  without serious comorbidity with body mass index (BMI) of 39.0 to 39.9 in adult 10/16/2008  . Breast cancer of lower-outer quadrant of right female breast ([place]) 08/29/2008    Past Surgical History:  Procedure Laterality Date  . APPENDECTOMY  1973   Dr. Linzie Collin  . BREAST LUMPECTOMY Right 08/29/2008   Dr. Erroll Luna  . ESOPHAGEAL DILATION    . NASAL POLYP EXCISION  10/2015  . POLYPECTOMY  11/20/14   Uterine Polyp Removed     OB History   No obstetric history on file.     Family History  Problem Relation Age of Onset  . Hypertension Mother   . Hyperlipidemia Mother   . Alzheimer's disease Mother   . Emphysema Father   . COPD Father     Social History   Tobacco Use  . Smoking status: Never Smoker  . Smokeless tobacco: Never Used  Vaping Use  . Vaping Use: Never used  Substance Use Topics  . Alcohol use: No  . Drug use: No    Home Medications Prior to Admission medications   Medication Sig Start Date End Date Taking? Authorizing Provider  Ascorbic Acid (VITAMIN C) 1000 MG tablet Take 1,000 mg by mouth daily.    [provider]  Calcium Citrate-Vitamin D 250-200 MG-UNIT TABS Take 500 mg by mouth daily.     [provider]  Cholecalciferol (VITAMIN D-3) 5000 units TABS Take by mouth daily.    [provider]  Cyanocobalamin 5000 MCG CAPS cyanocobalamin (vitamin B-12) 5,000 mcg capsule  Take 1 capsule every day by oral route.    [provider]  fluorometholone (FML) 0.1 % ophthalmic suspension INSTILL ONE DROP IN RIGHT EYE DAILY 10/08/19   [provider]  HYDROcodone-acetaminophen (NORCO) 10-325 MG tablet Take 1 tablet by mouth every 6 (six) hours as needed for severe pain. 06/23/20   Reed, Tiffany L, DO  omeprazole (PRILOSEC) 20 MG capsule Take 20 mg by mouth every other day.  08/10/19   [provider]  omeprazole (PRILOSEC) 40 MG capsule Take 40 mg by mouth every other day.     [provider]  OVER THE  COUNTER MEDICATION 1 capsule 3 (three) times daily. Balance of [employer] and Fruit.    [provider]  predniSONE (DELTASONE) 5 MG tablet Take 5 mg by mouth in the morning and at bedtime. 03/20/20   [provider]  timolol (TIMOPTIC) 0.5 % ophthalmic solution Place 1 drop into the right eye 2 times daily. 10/05/19   [provider]  valACYclovir (VALTREX) 1000 MG tablet Take 1,000 mg by mouth every other day.  10/05/19 10/04/20  [provider]  zinc gluconate 50 MG tablet Take by mouth.    [provider]    Allergies    Cymbalta [duloxetine hcl], Gabapentin, Adhesive [tape], Betimol [timolol maleate], Cosopt [dorzolamide hcl-timolol mal], Penicillins, Prednisolone acetate, Tamoxifen, Thimerosal, and Iodine-131  Review of Systems   Review of Systems  Constitutional: Negative for fever.  HENT: Negative for sore throat.   Eyes: Negative for redness.  Respiratory:  Negative for cough and shortness of breath.   Cardiovascular: Positive for chest pain. Negative for palpitations and leg swelling.  Gastrointestinal: Positive for nausea. Negative for abdominal pain.  Genitourinary: Negative for flank pain.  Musculoskeletal: Negative for neck pain.  Skin: Negative for rash.  Neurological: Negative for headaches.  Hematological: Does not bruise/bleed easily.  Psychiatric/Behavioral: Negative for confusion.    Physical Exam Updated Vital Signs BP 140/84 (BP Location: Left Arm)   Pulse (!) 103   Temp 98.2 F (36.8 C) (Oral)   Resp 17   SpO2 98%   Physical Exam Vitals and nursing note reviewed.  Constitutional:      Appearance: Normal appearance. She is well-developed.  HENT:     Head: Atraumatic.     Nose: Nose normal.     Mouth/Throat:     Mouth: Mucous membranes are moist.  Eyes:     General: No scleral icterus.    Conjunctiva/sclera: Conjunctivae normal.  Neck:     Trachea: No tracheal deviation.  Cardiovascular:     Rate and  Rhythm: Normal rate and regular rhythm.     Pulses: Normal pulses.     Heart sounds: Normal heart sounds. No murmur heard. No friction rub. No gallop.   Pulmonary:     Effort: Pulmonary effort is normal. No respiratory distress.     Breath sounds: Normal breath sounds.  Chest:     Chest wall: No tenderness.  Abdominal:     General: Bowel sounds are normal. There is no distension.     Palpations: Abdomen is soft. There is no mass.     Tenderness: There is no abdominal tenderness. There is no guarding.  Genitourinary:    Comments: No cva tenderness.  Musculoskeletal:        General: No swelling or tenderness.     Cervical back: Normal range of motion and neck supple. No rigidity. No muscular tenderness.  Skin:    General: Skin is warm and dry.     Findings: No rash.  Neurological:     Mental Status: She is alert.     Comments: Alert, speech normal.   Psychiatric:        Mood and Affect: Mood normal.     ED Results / Procedures / Treatments   Labs (all labs ordered are listed, but only abnormal results are displayed) Results for orders placed or performed during the hospital encounter of 0000000  Basic metabolic panel  Result Value Ref Range   Sodium 137 135 - 145 mmol/L   Potassium 3.8 3.5 - 5.1 mmol/L   Chloride 98 98 - 111 mmol/L   CO2 28 22 - 32 mmol/L   Glucose, Bld 120 (H) 70 - 99 mg/dL   BUN 12 8 - 23 mg/dL   Creatinine, Ser 0.63 0.44 - 1.00 mg/dL   Calcium 9.8 8.9 - 10.3 mg/dL   GFR, Estimated >60 >60 mL/min   Anion gap 11 5 - 15  CBC  Result Value Ref Range   WBC 9.9 4.0 - 10.5 K/uL   RBC 4.41 3.87 - 5.11 MIL/uL   Hemoglobin 13.1 12.0 - 15.0 g/dL   HCT 42.9 36.0 - 46.0 %   MCV 97.3 80.0 - 100.0 fL   MCH 29.7 26.0 - 34.0 pg   MCHC 30.5 30.0 - 36.0 g/dL   RDW 12.9 11.5 - 15.5 %   Platelets 294 150 - 400 K/uL   nRBC 0.0 0.0 - 0.2 %  Troponin I (High Sensitivity)  Result Value Ref Range   Troponin I (High Sensitivity) 9 <18 ng/L  Troponin I (High  Sensitivity)  Result Value Ref Range   Troponin I (High Sensitivity) 10 <18 ng/L   DG Chest 2 View  Result Date: 07/23/2020 CLINICAL DATA:  Chest pain EXAM: CHEST - 2 VIEW COMPARISON:  None. FINDINGS: The heart size and mediastinal contours are mildly enlarged. Aortic knob calcifications are seen. Both lungs are clear. The visualized skeletal structures are unremarkable. IMPRESSION: No active cardiopulmonary disease. Electronically Signed   By: Prudencio Pair M.D.   On: 07/23/2020 01:33   CT Angio Chest/Abd/Pel for Dissection W and/or W/WO  Result Date: 07/23/2020 CLINICAL DATA:  Difficulty swallowing, chest pain, previous esophageal dilatation, nausea EXAM: CT ANGIOGRAPHY CHEST, ABDOMEN AND PELVIS TECHNIQUE: Non-contrast CT of the chest was initially obtained. Multidetector CT imaging through the chest, abdomen and pelvis was performed using the standard protocol during bolus administration of intravenous contrast. Multiplanar reconstructed images and MIPs were obtained and reviewed to evaluate the vascular anatomy. CONTRAST:  129mL OMNIPAQUE IOHEXOL 350 MG/ML SOLN COMPARISON:  07/23/2020, 07/14/2010 FINDINGS: CTA CHEST FINDINGS Cardiovascular: The thoracic aorta is unremarkable without aneurysm or dissection. Minimal atherosclerosis. The heart is unremarkable without pericardial effusion. While not optimized for the opacification of the pulmonary vasculature, there is sufficient contrast enhancement to exclude pulmonary emboli. Mediastinum/Nodes: No enlarged mediastinal, hilar, or axillary lymph nodes. Thyroid gland, trachea, and esophagus demonstrate no significant findings. Small hiatal hernia is noted. Lungs/Pleura: No airspace disease, effusion, or pneumothorax. Central airways are patent. Musculoskeletal: No acute or destructive bony lesions. Reconstructed images demonstrate no additional findings. Review of the MIP images confirms the above findings. CTA ABDOMEN AND PELVIS FINDINGS VASCULAR Aorta:  Normal caliber aorta without aneurysm, dissection, vasculitis or significant stenosis. Celiac: Patent without evidence of aneurysm, dissection, vasculitis or significant stenosis. SMA: Patent without evidence of aneurysm, dissection, vasculitis or significant stenosis. Renals: Both renal arteries are patent without evidence of aneurysm, dissection, vasculitis, fibromuscular dysplasia or significant stenosis. Mild atherosclerosis at the bilateral renal ostia. IMA: Patent without evidence of aneurysm, dissection, vasculitis or significant stenosis. Inflow: Patent without evidence of aneurysm, dissection, vasculitis or significant stenosis. Veins: No obvious venous abnormality within the limitations of this arterial phase study. Review of the MIP images confirms the above findings. NON-VASCULAR Hepatobiliary: No focal liver abnormality is seen. No gallstones, gallbladder wall thickening, or biliary dilatation. Pancreas: Unremarkable. No pancreatic ductal dilatation or surrounding inflammatory changes. Spleen: Normal in size without focal abnormality. Adrenals/Urinary Tract: Adrenal glands are unremarkable. Kidneys are normal, without renal calculi, focal lesion, or hydronephrosis. Bladder is unremarkable. Stomach/Bowel: No bowel obstruction or ileus. Diverticulosis of the distal colon without diverticulitis. The appendix is surgically absent. No bowel wall thickening or inflammatory change. Lymphatic: No pathologic adenopathy within the abdomen or pelvis. Reproductive: Uterus and bilateral adnexa are unremarkable. Other: No free fluid or free gas. Fat containing umbilical hernia. Musculoskeletal: No acute or destructive bony lesions. Bilateral hip arthroplasties appear unremarkable. Multilevel spondylosis in the lumbar spine greatest from L3-4 through L5-S1. Reconstructed images demonstrate no additional findings. Review of the MIP images confirms the above findings. IMPRESSION: 1. Unremarkable thoracoabdominal aorta.  2. No evidence of pulmonary embolus. 3. No acute intrathoracic, intra-abdominal, or intrapelvic process. 4. Small hiatal hernia. 5. Diverticulosis of the distal colon without diverticulitis. 6.  Aortic Atherosclerosis (ICD10-I70.0). Electronically Signed   By: Randa Ngo M.D.   On: 07/23/2020 15:02   VAS [place] LOWER EXTREMITY VENOUS (DVT)  Result Date: 07/23/2020  Lower Venous DVT Study Indications: Swelling left lower extremity. Other Indications: Hip replacement 05/14/2020. Risk Factors: Obesity. Performing Technologist: Alvia Grove RVT  Examination Guidelines: A complete evaluation includes B-mode imaging, spectral Doppler, color Doppler, and power Doppler as needed of all accessible portions of each vessel. Bilateral testing is considered an integral part of a complete examination. Limited examinations for reoccurring indications may be performed as noted. The reflux portion of the exam is performed with the patient in reverse Trendelenburg.  +-----+---------------+---------+-----------+----------+--------------+ RIGHTCompressibilityPhasicitySpontaneityPropertiesThrombus Aging +-----+---------------+---------+-----------+----------+--------------+ CFV  Full           Yes      Yes                                 +-----+---------------+---------+-----------+----------+--------------+   +---------+---------------+---------+-----------+----------+--------------+ LEFT     CompressibilityPhasicitySpontaneityPropertiesThrombus Aging +---------+---------------+---------+-----------+----------+--------------+ CFV      Full           Yes      Yes                                 +---------+---------------+---------+-----------+----------+--------------+ SFJ      Full           Yes      Yes                                 +---------+---------------+---------+-----------+----------+--------------+ FV Prox  Full           Yes      Yes                                  +---------+---------------+---------+-----------+----------+--------------+ FV Mid   Full           Yes      Yes                                 +---------+---------------+---------+-----------+----------+--------------+ FV DistalFull           Yes      Yes                                 +---------+---------------+---------+-----------+----------+--------------+ PFV      Full           Yes      Yes                                 +---------+---------------+---------+-----------+----------+--------------+ POP      Full           Yes      Yes                                 +---------+---------------+---------+-----------+----------+--------------+ PTV      Full           Yes      Yes                                 +---------+---------------+---------+-----------+----------+--------------+ PERO  Full           Yes      Yes                                 +---------+---------------+---------+-----------+----------+--------------+ Gastroc  Full           Yes      Yes                                 +---------+---------------+---------+-----------+----------+--------------+ GSV      Full           Yes      Yes                                 +---------+---------------+---------+-----------+----------+--------------+ SSV      Full           Yes      Yes                                 +---------+---------------+---------+-----------+----------+--------------+    Findings reported to [facility] at 3:40.  Summary: LEFT: - There is no evidence of deep or superficial vein thrombosis in the lower extremity.  *See table(s) above for measurements and observations. Electronically signed by Ruta Hinds MD on 07/23/2020 at 9:18:01 AM.    Final      EKG EKG Interpretation  Date/Time:  Wednesday July 23 2020 00:56:32 EST Ventricular Rate:  109 PR Interval:  148 QRS Duration: 142 QT Interval:  384 QTC Calculation: 517 R Axis:   19 Text  Interpretation: Sinus tachycardia Left bundle branch block Abnormal ECG When compared with ECG of 08/27/2008, HEART RATE has-i Left bundle branch block is now present Confirmed by Delora Fuel ([id number]) on 07/23/2020 1:06:53 AM   Radiology DG Chest 2 View  Result Date: 07/23/2020 CLINICAL DATA:  Chest pain EXAM: CHEST - 2 VIEW COMPARISON:  None. FINDINGS: The heart size and mediastinal contours are mildly enlarged. Aortic knob calcifications are seen. Both lungs are clear. The visualized skeletal structures are unremarkable. IMPRESSION: No active cardiopulmonary disease. Electronically Signed   By: Prudencio Pair M.D.   On: 07/23/2020 01:33   VAS [place] LOWER EXTREMITY VENOUS (DVT)  Result Date: 07/23/2020  Lower Venous DVT Study Indications: Swelling left lower extremity. Other Indications: Hip replacement 05/14/2020. Risk Factors: Obesity. Performing Technologist: Alvia Grove RVT  Examination Guidelines: A complete evaluation includes B-mode imaging, spectral Doppler, color Doppler, and power Doppler as needed of all accessible portions of each vessel. Bilateral testing is considered an integral part of a complete examination. Limited examinations for reoccurring indications may be performed as noted. The reflux portion of the exam is performed with the patient in reverse Trendelenburg.  +-----+---------------+---------+-----------+----------+--------------+ RIGHTCompressibilityPhasicitySpontaneityPropertiesThrombus Aging +-----+---------------+---------+-----------+----------+--------------+ CFV  Full           Yes      Yes                                 +-----+---------------+---------+-----------+----------+--------------+   +---------+---------------+---------+-----------+----------+--------------+ LEFT     CompressibilityPhasicitySpontaneityPropertiesThrombus Aging +---------+---------------+---------+-----------+----------+--------------+ CFV      Full           Yes      Yes                                  +---------+---------------+---------+-----------+----------+--------------+  SFJ      Full           Yes      Yes                                 +---------+---------------+---------+-----------+----------+--------------+ FV Prox  Full           Yes      Yes                                 +---------+---------------+---------+-----------+----------+--------------+ FV Mid   Full           Yes      Yes                                 +---------+---------------+---------+-----------+----------+--------------+ FV DistalFull           Yes      Yes                                 +---------+---------------+---------+-----------+----------+--------------+ PFV      Full           Yes      Yes                                 +---------+---------------+---------+-----------+----------+--------------+ POP      Full           Yes      Yes                                 +---------+---------------+---------+-----------+----------+--------------+ PTV      Full           Yes      Yes                                 +---------+---------------+---------+-----------+----------+--------------+ PERO     Full           Yes      Yes                                 +---------+---------------+---------+-----------+----------+--------------+ Gastroc  Full           Yes      Yes                                 +---------+---------------+---------+-----------+----------+--------------+ GSV      Full           Yes      Yes                                 +---------+---------------+---------+-----------+----------+--------------+ SSV      Full           Yes      Yes                                 +---------+---------------+---------+-----------+----------+--------------+  Findings reported to [place] at 3:40.  Summary: LEFT: - There is no evidence of deep or superficial vein thrombosis in the lower extremity.  *See table(s) above for  measurements and observations. Electronically signed by Ruta Hinds MD on 07/23/2020 at 9:18:01 AM.    Final     Procedures Procedures (including critical care time)  Medications Ordered in ED Medications - No data to display  ED Course  I have reviewed the triage vital signs and the nursing notes.  Pertinent labs & imaging results that were available during my care of the patient were reviewed by me and considered in my medical decision making (see chart for details).    MDM Rules/Calculators/A&P                         Iv ns. Continuous pulse ox and cardiac monitoring. Stat labs. Ecg. Cxr.   Reviewed nursing notes and prior charts for additional history.   CXR reviewed/interpreted by me - no pna.   Labs reviewed/interpreted by me - initial and delta trop both normal, not increasing, felt not c/w ACS.   Pt requests pain med - states take hydrocodone on regular basis for chronic pain, and hasnt taken in past day. Dilaudid .5 mg iv. zofran iv.   CT reviewed/interpreted by me - no dissection, aneurysm, or PE.   Recheck pt, symptoms resolved. No cp or sob. Pt is able to tolerate fluids, no emesis or fb sensation.   Pt is symptom free and currently appears stable for d/c.   Rec close outpt cardiology, gi f/u (pt indicates has seen [facility] in past).   Return precautions provided.     Final Clinical Impression(s) / ED Diagnoses Final diagnoses:  None    Rx / DC Orders ED Discharge Orders    None           Lajean Saver, MD 07/23/20 [phone]

## 2020-07-24 ENCOUNTER — Other Ambulatory Visit: Payer: Self-pay | Admitting: *Deleted

## 2020-07-24 ENCOUNTER — Ambulatory Visit: Payer: Medicare Other | Admitting: Podiatry

## 2020-07-24 DIAGNOSIS — M5431 Sciatica, right side: Secondary | ICD-10-CM

## 2020-07-24 MED ORDER — HYDROCODONE-ACETAMINOPHEN 10-325 MG PO TABS: 1.0000 | ORAL_TABLET | Freq: Four times a day (QID) | ORAL | 0 refills | Status: DC | PRN

## 2020-07-24 NOTE — Telephone Encounter (Signed)
Patient requested refill Epic LR: 06/23/2020 Contract on file. Note added to upcoming appointment to update Pended Rx and sent to Dr. Renato Gails for approval.

## 2020-07-28 ENCOUNTER — Ambulatory Visit: Payer: Medicare Other | Admitting: Physical Therapy

## 2020-07-30 ENCOUNTER — Encounter: Payer: Self-pay | Admitting: Physical Therapy

## 2020-07-30 ENCOUNTER — Other Ambulatory Visit: Payer: Self-pay

## 2020-07-30 ENCOUNTER — Ambulatory Visit: Payer: Medicare Other | Attending: Sports Medicine | Admitting: Physical Therapy

## 2020-07-30 DIAGNOSIS — M25652 Stiffness of left hip, not elsewhere classified: Secondary | ICD-10-CM | POA: Diagnosis not present

## 2020-07-30 DIAGNOSIS — R262 Difficulty in walking, not elsewhere classified: Secondary | ICD-10-CM | POA: Diagnosis not present

## 2020-07-30 DIAGNOSIS — M25552 Pain in left hip: Secondary | ICD-10-CM | POA: Diagnosis not present

## 2020-07-30 DIAGNOSIS — M25551 Pain in right hip: Secondary | ICD-10-CM | POA: Diagnosis not present

## 2020-07-30 DIAGNOSIS — M6281 Muscle weakness (generalized): Secondary | ICD-10-CM | POA: Diagnosis not present

## 2020-07-30 NOTE — Therapy (Signed)
[place], [place], [address] Phone: [phone]   Fax:  [phone]  Physical Therapy Treatment/[facility] Note  Patient Details  Name: [name] MRN: [id number] Date of Birth: May 17, 1946 Referring Provider (PT): Dr   Progress Note Reporting Period 06/10/2021  to 07/30/2020  See note below for Objective Data and Assessment of Progress/Goals.       Encounter Date: 07/30/2020   PT End of Session - 07/30/20 1615    Visit Number 10    Number of Visits 22    Date for PT Re-Evaluation 09/10/20    PT Start Time 6073    PT Stop Time 1225    PT Time Calculation (min) 40 min    Activity Tolerance Patient limited by fatigue;Patient tolerated treatment well    Behavior During Therapy [facility] for tasks assessed/performed           Past Medical History:  Diagnosis Date  . Asymptomatic varicose veins   . Cancer (HCC)    breast   . Diverticulosis   . DM type 2 (diabetes mellitus, type 2) ([place]) 10/23/2014  . Dysuria   . GERD (gastroesophageal reflux disease)   . Hiatal hernia   . Hyperlipidemia   . Malignant neoplasm of breast (female), unspecified site   . Migraine without aura, without mention of intractable migraine without mention of status migrainosus   . Other abnormal blood chemistry   . Pain in joint, pelvic region and thigh   . Shingles    in eyes  . Stricture and stenosis of esophagus   . Trigger finger (acquired)   . Unspecified cataract   . Unspecified glaucoma(365.9)     Past Surgical History:  Procedure Laterality Date  . APPENDECTOMY  1973   Dr. Linzie Collin  . BREAST LUMPECTOMY Right 08/29/2008   Dr. Erroll Luna  . ESOPHAGEAL DILATION    . NASAL POLYP EXCISION  10/2015  . POLYPECTOMY  11/20/14   Uterine Polyp Removed    There were no vitals filed for this visit.   Subjective Assessment - 07/30/20 1151    Subjective Patient had chest pain ast week then was unable to come 2nd to  the snow on Monday. She has been cleared by cardisac but will follow up. Her hip is doing well.    Pertinent History DM, glaucoma, reports a myriad of other issues with her health, GERD    How long can you stand comfortably? unable to stand to do ADL's for more then a few minutes    How long can you walk comfortably? limited sidtances    Patient Stated Goals have less pain and walk better    Currently in Pain? No/denies              [facility] PT Assessment - 07/30/20 0001      AROM   Overall AROM Comments activehip flexion to 90 without pain      Strength   Right Hip ABduction 5/5    Right Hip ADduction 5/5    Left Hip Flexion 5/5    Left Knee Flexion 5/5    Left Knee Extension 5/5            FOTO score 38% ability goal 41% ability              [facility] Adult PT Treatment/Exercise - 07/30/20 0001      Ambulation/Gait   Gait Comments ambualted 2x50' with single point cane. Patient reported fatigue and pain  in the back. She continues to walked with flexed trunk but this could be baseline.      Self-Care   Other Self-Care Comments  reviewed FOTOT scoreand functional goals; also reviewed measurements and POC going forward      Lumbar Exercises: Aerobic   Nustep 6 min    Other Aerobic Exercise while monitoring motion      Lumbar Exercises: Seated   LAQ on Chair Limitations x20 2lb weight    Other Seated Lumbar Exercises seated clamshells 3x10 red TB; bilateral ; hamstring curl red x20;      Knee/Hip Exercises: Standing   Hip Flexion AROM;Stengthening;Both;3 sets;10 reps    Hip Flexion Limitations slow march in standing                  PT Education - 07/30/20 1208    Education Details reviewed core strengthening principals    Person(s) Educated Patient    Methods Explanation;Demonstration;Verbal cues;Tactile cues    Comprehension Verbalized understanding;Returned demonstration;Verbal cues required            PT Short Term Goals - 07/30/20 1523       PT SHORT TERM GOAL #1   Title Patientwill transfer supine to sit  and sit to supine with supervision    Baseline able to perform    Time 4    Period Weeks    Status Achieved    Target Date 07/08/20      PT SHORT TERM GOAL #2   Title Patient will dmeonstrate full active hip flexion to 90 degrees    Baseline able to do in the nu-step    Time 4    Period Weeks    Status Achieved      PT SHORT TERM GOAL #3   Title Patient will ambaulte 300' without increased pain with good posture without cuing with LRAD    Baseline walking with cane but still postural deficits    Time 4    Period Weeks    Status Partially Met    Target Date 07/08/20      PT SHORT TERM GOAL #4   Title Therapy will review FOTO    Baseline reviewed    Time 4    Period Weeks    Status Achieved             PT Long Term Goals - 07/30/20 1531      PT LONG TERM GOAL #1   Title Patient will ambualte 3000' with LRAD without pain    Baseline continues to work on endruance    Time 8    Period Weeks    Status New      PT LONG TERM GOAL #2   Title Patient will demonstrate a 54 % on FOTO to demonstrate improved fucntion    Baseline 39 % ability 41 % expected    Time 8    Period Weeks    Status On-going      PT LONG TERM GOAL #3   Title Patient will transfer sit to stand without use of the hands without increased pain    Baseline still needs a small boost    Time 8    Period Weeks    Status On-going                 Plan - 07/30/20 1512    Clinical Impression Statement Patient is making good progress. We have been working on progressing her ambualtion with a cane. She has  good stability butr limited endurance. She mostly feels it in her back. She walks with a flexed position. She has baseline stenois. We gave her some core strengthening to work on at home today. Therapy reviewed FOTO outcome scores and expected functional outcomes. We will continue to advance exercises and progress strengthening as  tolerated. She would benefit from further therapy 2W6.    Personal Factors and Comorbidities Comorbidity 3+    Examination-Activity Limitations Carry;Squat;Stairs;Dressing;Locomotion Level;Transfers;Bed Mobility    Stability/Clinical Decision Making Evolving/Moderate complexity    Clinical Decision Making Moderate    Rehab Potential Good    PT Frequency 2x / week    PT Duration 6 weeks    PT Treatment/Interventions ADLs/Self Care Home Management;Cryotherapy;Electrical Stimulation;Iontophoresis 4mg /ml Dexamethasone;Moist Heat;Therapeutic activities;Functional mobility training;Gait training;Therapeutic exercise;Balance training;Neuromuscular re-education;Patient/family education;Dry needling;Ultrasound;Manual techniques;Taping    PT Next Visit Plan progress standing activity; contoinue with manual therapy if needed    PT Home Exercise Plan has a good exercise program at home.    Consulted and Agree with Plan of Care Patient           Patient will benefit from skilled therapeutic intervention in order to improve the following deficits and impairments:  Abnormal gait,Cardiopulmonary status limiting activity,Decreased activity tolerance,Decreased endurance,Decreased range of motion,Decreased strength,Difficulty walking,Increased edema,Decreased balance,Decreased mobility,Impaired flexibility,Obesity,Increased muscle spasms,Pain,Improper body mechanics,Decreased knowledge of use of DME  Visit Diagnosis: Pain in left hip - Plan: PT plan of care cert/re-cert  Stiffness of left hip, not elsewhere classified - Plan: PT plan of care cert/re-cert  Difficulty in walking, not elsewhere classified - Plan: PT plan of care cert/re-cert  Muscle weakness (generalized) - Plan: PT plan of care cert/re-cert  Pain in right hip - Plan: PT plan of care cert/re-cert     Problem List Patient Active Problem List   Diagnosis Date Noted  . Pain and swelling of right lower leg 07/21/2020  . Neurogenic  claudication due to lumbar spinal stenosis 05/28/2019  . Right groin pain 05/28/2019  . Polymyalgia rheumatica ([place]) 10/09/2018  . BMI 40.0-44.9, adult ([place]) 10/09/2018  . Foraminal stenosis of lumbar region 05/11/2018  . 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 On prednisone therapy 02/14/2018  . Herpes zoster 10/16/2017  . Chronic pain of both shoulders 08/10/2017  . Prediabetes 05/26/2017  . Primary osteoarthritis of both hips 03/15/2017  . Trochanteric bursitis of left hip 03/15/2017  . Recurrent epistaxis 12/09/2016  . Dysuria 11/03/2016  . Insomnia 11/03/2016  . Glaucoma 05/05/2016  . LPRD (laryngopharyngeal reflux disease) 01/26/2016  . Erosive esophagitis 11/27/2015  . Schatzki's ring of distal esophagus 11/27/2015  . Chronic sinusitis 10/16/2015  . Deviated nasal septum 10/16/2015  . Nasal polyp 10/16/2015  . Osteoporosis 08/15/2015  . Dysphagia, pharyngoesophageal phase 04/01/2015  . Endometrial polyp 10/24/2014  . Fatigue 07/09/2014  . Hyperlipidemia   . Breast cancer, right ([place]) 02/01/2014  . Malignant melanoma of arm ([place]) 11/23/2013  . Nasal septum ulceration 10/16/2013  . Seborrheic keratosis 10/16/2013  . Vitamin D deficiency 02/08/2013  . Sciatica of right side 01/24/2013  . Esophageal ulcer 01/24/2013  . Esophageal stenosis 01/24/2013  . Choroidal nevus of right eye 10/27/2012  . Impacted cerumen of right ear 10/11/2012  . Frequency 01/19/2012  . Gastroesophageal reflux disease 09/01/2011  . 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 H/O cataract extraction 04/30/2011  . Borderline glaucoma steroid responder 04/30/2011  . Class 2 obesity due to excess calories without serious  comorbidity with body mass index (BMI) of 39.0 to 39.9 in adult 10/16/2008  . Breast cancer of lower-outer quadrant of right female breast ([place]) 08/29/2008    Carney Living 07/30/2020, 4:43  PM  [facility] [address] [place], [place], [address] Phone: [phone]   Fax:  [phone]  Name: [name] MRN: [id number] Date of Birth: 04-Oct-1945

## 2020-08-01 DIAGNOSIS — H4041X2 Glaucoma secondary to eye inflammation, right eye, moderate stage: Secondary | ICD-10-CM | POA: Diagnosis not present

## 2020-08-01 DIAGNOSIS — H209 Unspecified iridocyclitis: Secondary | ICD-10-CM | POA: Diagnosis not present

## 2020-08-05 ENCOUNTER — Encounter: Payer: Self-pay | Admitting: Physical Therapy

## 2020-08-05 ENCOUNTER — Other Ambulatory Visit: Payer: Self-pay

## 2020-08-05 ENCOUNTER — Ambulatory Visit: Payer: Medicare Other | Admitting: Physical Therapy

## 2020-08-05 DIAGNOSIS — M6281 Muscle weakness (generalized): Secondary | ICD-10-CM | POA: Diagnosis not present

## 2020-08-05 DIAGNOSIS — R262 Difficulty in walking, not elsewhere classified: Secondary | ICD-10-CM

## 2020-08-05 DIAGNOSIS — M25551 Pain in right hip: Secondary | ICD-10-CM

## 2020-08-05 DIAGNOSIS — M25652 Stiffness of left hip, not elsewhere classified: Secondary | ICD-10-CM | POA: Diagnosis not present

## 2020-08-05 DIAGNOSIS — M25552 Pain in left hip: Secondary | ICD-10-CM | POA: Diagnosis not present

## 2020-08-06 ENCOUNTER — Encounter: Payer: Self-pay | Admitting: Physical Therapy

## 2020-08-06 NOTE — Therapy (Signed)
[place], [place], [address] Phone: [phone]   Fax:  [phone]  Physical Therapy Treatment  Patient Details  Name: [name] MRN: [id number] Date of Birth: 03-26-1946 Referring Provider (PT): Dr    Gareth Morgan Date: 08/05/2020   PT End of Session - 08/06/20 0928    Visit Number 11    Number of Visits 22    Date for PT Re-Evaluation 09/10/20    PT Start Time 1245    PT Stop Time 1226    PT Time Calculation (min) 41 min    Activity Tolerance Patient limited by fatigue;Patient tolerated treatment well    Behavior During Therapy [facility] for tasks assessed/performed           Past Medical History:  Diagnosis Date  . Asymptomatic varicose veins   . Cancer (HCC)    breast   . Diverticulosis   . DM type 2 (diabetes mellitus, type 2) ([place]) 10/23/2014  . Dysuria   . GERD (gastroesophageal reflux disease)   . Hiatal hernia   . Hyperlipidemia   . Malignant neoplasm of breast (female), unspecified site   . Migraine without aura, without mention of intractable migraine without mention of status migrainosus   . Other abnormal blood chemistry   . Pain in joint, pelvic region and thigh   . Shingles    in eyes  . Stricture and stenosis of esophagus   . Trigger finger (acquired)   . Unspecified cataract   . Unspecified glaucoma(365.9)     Past Surgical History:  Procedure Laterality Date  . APPENDECTOMY  1973   Dr. Linzie Collin  . BREAST LUMPECTOMY Right 08/29/2008   Dr. Erroll Luna  . ESOPHAGEAL DILATION    . NASAL POLYP EXCISION  10/2015  . POLYPECTOMY  11/20/14   Uterine Polyp Removed    There were no vitals filed for this visit.   Subjective Assessment - 08/05/20 1152    Subjective Patient has been using her cane more. She feels like it ihas flaired up her lower back a little.    Pertinent History DM, glaucoma, reports a myriad of other issues with her health, GERD    How long can you stand  comfortably? unable to stand to do ADL's for more then a few minutes    How long can you walk comfortably? limited sidtances    Patient Stated Goals have less pain and walk better    Currently in Pain? Yes    Pain Score 2     Pain Location Back    Pain Orientation Right    Pain Descriptors / Indicators Aching    Pain Type Acute pain    Pain Onset More than a month ago    Pain Frequency Constant    Aggravating Factors  standing and walking    Pain Relieving Factors rest and pain meds    Effect of Pain on Daily Activities pain with ambualtion                             [facility] Adult PT Treatment/Exercise - 08/06/20 0001      Knee/Hip Exercises: Standing   Heel Raises Limitations x20    Hip Flexion AROM;Stengthening;Both;3 sets;10 reps    Hip Flexion Limitations slow march in standing    Other Standing Knee Exercises standing slow march 2x10; heel raise 2x10      Knee/Hip Exercises: Seated   Hamstring  Limitations 3x10 red     Abd/Adduction Limitations hip abdcution yellow 3x10 red      Moist Heat Therapy   Number Minutes Moist Heat 10 Minutes    Moist Heat Location Lumbar Spine      Manual Therapy   Manual therapy comments trigger point release to lumbar spine, sacral area, and gluteal. Improved pain and stiffness after trigger point release                  PT Education - 08/06/20 0927    Education Details HEP and symptom management    Person(s) Educated Patient    Methods Explanation;Demonstration;Tactile cues;Verbal cues    Comprehension Verbalized understanding;Returned demonstration;Verbal cues required;Tactile cues required            PT Short Term Goals - 08/06/20 1004      PT SHORT TERM GOAL #1   Title Patientwill transfer supine to sit  and sit to supine with supervision    Baseline able to perform    Time 4    Period Weeks    Status Achieved    Target Date 07/08/20      PT SHORT TERM GOAL #2   Title Patient will dmeonstrate  full active hip flexion to 90 degrees    Baseline able to do in the nu-step    Period Weeks    Status Achieved    Target Date 07/08/20      PT SHORT TERM GOAL #3   Title Patient will ambaulte 300' without increased pain with good posture without cuing with LRAD    Baseline walking with cane but still postural deficits    Time 4    Period Weeks    Status On-going      PT SHORT TERM GOAL #4   Title Therapy will review FOTO    Baseline reviewed    Period Weeks    Status Achieved             PT Long Term Goals - 07/30/20 1531      PT LONG TERM GOAL #1   Title Patient will ambualte 3000' with LRAD without pain    Baseline continues to work on endruance    Time 8    Period Weeks    Status New      PT LONG TERM GOAL #2   Title Patient will demonstrate a 54 % on FOTO to demonstrate improved fucntion    Baseline 39 % ability 41 % expected    Time 8    Period Weeks    Status On-going      PT LONG TERM GOAL #3   Title Patient will transfer sit to stand without use of the hands without increased pain    Baseline still needs a small boost    Time 8    Period Weeks    Status On-going                 Plan - 08/06/20 6606    Clinical Impression Statement Patient had a spasm in her SI, lower lumbar area. She reported decreased pain and improved stiffness with manual therapy. Therapy perfromed trigger point releease to those areas. After manual therapy she was able to complete standing exercises. She has been using her cane more at home. She was advised to continue doing so but remmeber if her back flairs up to use her tennis ball.    Personal Factors and Comorbidities Comorbidity 3+    Comorbidities GERD,  DM, right hip replacement; bilateral LE edema    Examination-Activity Limitations Carry;Squat;Stairs;Dressing;Locomotion Level;Transfers;Bed Mobility    Examination-Participation Restrictions Meal Prep;Cleaning;Community Activity;Laundry;Yard Work    Copy Evolving/Moderate complexity    Clinical Decision Making Moderate    Rehab Potential Good    PT Frequency 2x / week    PT Duration 6 weeks    PT Treatment/Interventions ADLs/Self Care Home Management;Cryotherapy;Electrical Stimulation;Iontophoresis 4mg /ml Dexamethasone;Moist Heat;Therapeutic activities;Functional mobility training;Gait training;Therapeutic exercise;Balance training;Neuromuscular re-education;Patient/family education;Dry needling;Ultrasound;Manual techniques;Taping    PT Next Visit Plan progress standing activity; contoinue with manual therapy if needed    PT Home Exercise Plan has a good exercise program at home.    Consulted and Agree with Plan of Care Patient           Patient will benefit from skilled therapeutic intervention in order to improve the following deficits and impairments:  Abnormal gait,Cardiopulmonary status limiting activity,Decreased activity tolerance,Decreased endurance,Decreased range of motion,Decreased strength,Difficulty walking,Increased edema,Decreased balance,Decreased mobility,Impaired flexibility,Obesity,Increased muscle spasms,Pain,Improper body mechanics,Decreased knowledge of use of DME  Visit Diagnosis: Pain in left hip  Stiffness of left hip, not elsewhere classified  Difficulty in walking, not elsewhere classified  Muscle weakness (generalized)  Pain in right hip     Problem List Patient Active Problem List   Diagnosis Date Noted  . Pain and swelling of right lower leg 07/21/2020  . Neurogenic claudication due to lumbar spinal stenosis 05/28/2019  . Right groin pain 05/28/2019  . Polymyalgia rheumatica ([place]) 10/09/2018  . BMI 40.0-44.9, adult ([place]) 10/09/2018  . Foraminal stenosis of lumbar region 05/11/2018  . 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 On prednisone therapy 02/14/2018  . Herpes zoster 10/16/2017  . Chronic pain of both shoulders 08/10/2017  . Prediabetes 05/26/2017   . Primary osteoarthritis of both hips 03/15/2017  . Trochanteric bursitis of left hip 03/15/2017  . Recurrent epistaxis 12/09/2016  . Dysuria 11/03/2016  . Insomnia 11/03/2016  . Glaucoma 05/05/2016  . LPRD (laryngopharyngeal reflux disease) 01/26/2016  . Erosive esophagitis 11/27/2015  . Schatzki's ring of distal esophagus 11/27/2015  . Chronic sinusitis 10/16/2015  . Deviated nasal septum 10/16/2015  . Nasal polyp 10/16/2015  . Osteoporosis 08/15/2015  . Dysphagia, pharyngoesophageal phase 04/01/2015  . Endometrial polyp 10/24/2014  . Fatigue 07/09/2014  . Hyperlipidemia   . Breast cancer, right ([place]) 02/01/2014  . Malignant melanoma of arm ([place]) 11/23/2013  . Nasal septum ulceration 10/16/2013  . Seborrheic keratosis 10/16/2013  . Vitamin D deficiency 02/08/2013  . Sciatica of right side 01/24/2013  . Esophageal ulcer 01/24/2013  . Esophageal stenosis 01/24/2013  . Choroidal nevus of right eye 10/27/2012  . Impacted cerumen of right ear 10/11/2012  . Frequency 01/19/2012  . Gastroesophageal reflux disease 09/01/2011  . 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 H/O cataract extraction 04/30/2011  . Borderline glaucoma steroid responder 04/30/2011  . Class 2 obesity due to excess calories without serious comorbidity with body mass index (BMI) of 39.0 to 39.9 in adult 10/16/2008  . Breast cancer of lower-outer quadrant of right female breast ([place]) 08/29/2008    Carney Living PT DPT  08/06/2020, 10:06 AM  [facility] [address] [place], [place], [address] Phone: [phone]   Fax:  [phone]  Name: [name] MRN: [id number] Date of Birth: April 09, 1946

## 2020-08-08 DIAGNOSIS — H209 Unspecified iridocyclitis: Secondary | ICD-10-CM | POA: Insufficient documentation

## 2020-08-11 ENCOUNTER — Other Ambulatory Visit: Payer: Medicare Other

## 2020-08-12 ENCOUNTER — Ambulatory Visit: Payer: Medicare Other | Admitting: Physical Therapy

## 2020-08-14 ENCOUNTER — Ambulatory Visit: Payer: Medicare Other | Admitting: Internal Medicine

## 2020-08-14 ENCOUNTER — Other Ambulatory Visit: Payer: Medicare Other

## 2020-08-15 ENCOUNTER — Ambulatory Visit: Payer: Medicare Other | Admitting: Physical Therapy

## 2020-08-18 ENCOUNTER — Ambulatory Visit: Payer: Medicare Other | Admitting: Podiatry

## 2020-08-20 ENCOUNTER — Encounter: Payer: Self-pay | Admitting: Physical Therapy

## 2020-08-20 ENCOUNTER — Ambulatory Visit: Payer: Medicare Other | Admitting: Physical Therapy

## 2020-08-20 ENCOUNTER — Other Ambulatory Visit: Payer: Self-pay

## 2020-08-20 DIAGNOSIS — M25652 Stiffness of left hip, not elsewhere classified: Secondary | ICD-10-CM

## 2020-08-20 DIAGNOSIS — M6281 Muscle weakness (generalized): Secondary | ICD-10-CM

## 2020-08-20 DIAGNOSIS — M25552 Pain in left hip: Secondary | ICD-10-CM | POA: Diagnosis not present

## 2020-08-20 DIAGNOSIS — R262 Difficulty in walking, not elsewhere classified: Secondary | ICD-10-CM | POA: Diagnosis not present

## 2020-08-20 DIAGNOSIS — M25551 Pain in right hip: Secondary | ICD-10-CM | POA: Diagnosis not present

## 2020-08-21 ENCOUNTER — Encounter: Payer: Self-pay | Admitting: Physical Therapy

## 2020-08-21 NOTE — Therapy (Signed)
[place], [place], [address] Phone: [phone]   Fax:  [phone]  Physical Therapy Treatment  Patient Details  Name: [name] MRN: [id number] Date of Birth: 09-20-1945 Referring Provider (PT): Dr    Gareth Morgan Date: 08/20/2020   PT End of Session - 08/21/20 0959    Visit Number 12    Number of Visits 22    Date for PT Re-Evaluation 09/10/20    Authorization Type MCR BCBS    PT Start Time 4270    PT Stop Time 1228    PT Time Calculation (min) 43 min    Activity Tolerance Patient limited by fatigue;Patient tolerated treatment well    Behavior During Therapy [facility] for tasks assessed/performed           Past Medical History:  Diagnosis Date  . Asymptomatic varicose veins   . Cancer (HCC)    breast   . Diverticulosis   . DM type 2 (diabetes mellitus, type 2) ([place]) 10/23/2014  . Dysuria   . GERD (gastroesophageal reflux disease)   . Hiatal hernia   . Hyperlipidemia   . Malignant neoplasm of breast (female), unspecified site   . Migraine without aura, without mention of intractable migraine without mention of status migrainosus   . Other abnormal blood chemistry   . Pain in joint, pelvic region and thigh   . Shingles    in eyes  . Stricture and stenosis of esophagus   . Trigger finger (acquired)   . Unspecified cataract   . Unspecified glaucoma(365.9)     Past Surgical History:  Procedure Laterality Date  . APPENDECTOMY  1973   Dr. Linzie Collin  . BREAST LUMPECTOMY Right 08/29/2008   Dr. Erroll Luna  . ESOPHAGEAL DILATION    . NASAL POLYP EXCISION  10/2015  . POLYPECTOMY  11/20/14   Uterine Polyp Removed    There were no vitals filed for this visit.   Subjective Assessment - 08/20/20 1220    Subjective Patient has not been able to come in for a few visits because of the ice. She has been using her cane more and comes in with the cane today. She has been having pain in her right SI  joint    Pertinent History DM, glaucoma, reports a myriad of other issues with her health, GERD    How long can you stand comfortably? unable to stand to do ADL's for more then a few minutes    How long can you walk comfortably? limited sidtances    Patient Stated Goals have less pain and walk better    Currently in Pain? Yes    Pain Score 5     Pain Location Back    Pain Orientation Right    Pain Descriptors / Indicators Aching    Pain Type Chronic pain    Pain Radiating Towards around the right SI joint    Pain Onset More than a month ago    Pain Frequency Constant    Aggravating Factors  standing and walking    Pain Relieving Factors rest and pain meds    Effect of Pain on Daily Activities pain with mabualtion                             [facility] Adult PT Treatment/Exercise - 08/21/20 0001      Lumbar Exercises: Aerobic   Nustep 6 min    Other Aerobic  Exercise while monitoring motion      Lumbar Exercises: Seated   LAQ on Chair Limitations x20 2lb weight    Other Seated Lumbar Exercises seated clamshells 3x10 green  TB; bilateral ; hamstring curl red x20;      Knee/Hip Exercises: Standing   Heel Raises Limitations x20    Hip Flexion AROM;Stengthening;Both;3 sets;10 reps    Hip Flexion Limitations slow march in standing    Other Standing Knee Exercises standing slow march 2x10; heel raise 2x10      Knee/Hip Exercises: Seated   Hamstring Limitations 3x10 red     Abd/Adduction Limitations hip abdcution yellow 3x10 red      Moist Heat Therapy   Moist Heat Location Lumbar Spine      Manual Therapy   Manual therapy comments trigger point release to lumbar spine, sacral area, and gluteal. Improved pain and stiffness after trigger point release                  PT Education - 08/21/20 0958    Education Details reviewed improtance of standing exercises for gait    Person(s) Educated Patient    Methods Explanation;Demonstration;Tactile cues;Verbal  cues    Comprehension Verbalized understanding;Returned demonstration;Verbal cues required;Tactile cues required            PT Short Term Goals - 08/06/20 1004      PT SHORT TERM GOAL #1   Title Patientwill transfer supine to sit  and sit to supine with supervision    Baseline able to perform    Time 4    Period Weeks    Status Achieved    Target Date 07/08/20      PT SHORT TERM GOAL #2   Title Patient will dmeonstrate full active hip flexion to 90 degrees    Baseline able to do in the nu-step    Period Weeks    Status Achieved    Target Date 07/08/20      PT SHORT TERM GOAL #3   Title Patient will ambaulte 300' without increased pain with good posture without cuing with LRAD    Baseline walking with cane but still postural deficits    Time 4    Period Weeks    Status On-going      PT SHORT TERM GOAL #4   Title Therapy will review FOTO    Baseline reviewed    Period Weeks    Status Achieved             PT Long Term Goals - 07/30/20 1531      PT LONG TERM GOAL #1   Title Patient will ambualte 3000' with LRAD without pain    Baseline continues to work on endruance    Time 8    Period Weeks    Status New      PT LONG TERM GOAL #2   Title Patient will demonstrate a 54 % on FOTO to demonstrate improved fucntion    Baseline 39 % ability 41 % expected    Time 8    Period Weeks    Status On-going      PT LONG TERM GOAL #3   Title Patient will transfer sit to stand without use of the hands without increased pain    Baseline still needs a small boost    Time 8    Period Weeks    Status On-going  Plan - 08/20/20 1554    Clinical Impression Statement Therapy continues to work on manual therapy to the sacral area. It is her biggest probelm sincce she has switched to the cane. therapy reviewed how lateral movement can effect that area. We worked on forward weight shifting and forward hip flexion at the sink. She was advised to work on this  at home. She will contoinue to try to listent to her back and use her walker vs cane acordingly.    Personal Factors and Comorbidities Comorbidity 3+    Comorbidities GERD, DM, right hip replacement; bilateral LE edema    Examination-Activity Limitations Carry;Squat;Stairs;Dressing;Locomotion Level;Transfers;Bed Mobility    Examination-Participation Restrictions Meal Prep;Cleaning;Community Activity;Laundry;Yard Work    [employer] officer Evolving/Moderate complexity    Clinical Decision Making Moderate    Rehab Potential Good    PT Frequency 2x / week    PT Duration 6 weeks    PT Treatment/Interventions ADLs/Self Care Home Management;Cryotherapy;Electrical Stimulation;Iontophoresis 35m/ml Dexamethasone;Moist Heat;Therapeutic activities;Functional mobility training;Gait training;Therapeutic exercise;Balance training;Neuromuscular re-education;Patient/family education;Dry needling;Ultrasound;Manual techniques;Taping    PT Next Visit Plan progress standing activity; contoinue with manual therapy if needed    PT Home Exercise Plan has a good exercise program at home.    Consulted and Agree with Plan of Care Patient           Patient will benefit from skilled therapeutic intervention in order to improve the following deficits and impairments:  Abnormal gait,Cardiopulmonary status limiting activity,Decreased activity tolerance,Decreased endurance,Decreased range of motion,Decreased strength,Difficulty walking,Increased edema,Decreased balance,Decreased mobility,Impaired flexibility,Obesity,Increased muscle spasms,Pain,Improper body mechanics,Decreased knowledge of use of DME  Visit Diagnosis: Pain in left hip  Stiffness of left hip, not elsewhere classified  Difficulty in walking, not elsewhere classified  Muscle weakness (generalized)     Problem List Patient Active Problem List   Diagnosis Date Noted  . Uveitic glaucoma of right eye, moderate stage 08/08/2020  . Pain  and swelling of right lower leg 07/21/2020  . Aftercare following joint replacement surgery 01/23/2020  . Neurogenic claudication due to lumbar spinal stenosis 05/28/2019  . Right groin pain 05/28/2019  . [place]umatica ([place] 10/09/2018  . BMI 40.0-44.9, adult ([place] 10/09/2018  . Foraminal stenosis of lumbar region 05/11/2018  . 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 On prednisone therapy 02/14/2018  . Herpes zoster 10/16/2017  . Chronic pain of both shoulders 08/10/2017  . Prediabetes 05/26/2017  . Primary osteoarthritis of both hips 03/15/2017  . Trochanteric bursitis of left hip 03/15/2017  . Recurrent epistaxis 12/09/2016  . Dysuria 11/03/2016  . Insomnia 11/03/2016  . Glaucoma 05/05/2016  . LPRD (laryngopharyngeal reflux disease) 01/26/2016  . Erosive esophagitis 11/27/2015  . Schatzki's ring of distal esophagus 11/27/2015  . Chronic sinusitis 10/16/2015  . Deviated nasal septum 10/16/2015  . Nasal polyp 10/16/2015  . Osteoporosis 08/15/2015  . Dysphagia, pharyngoesophageal phase 04/01/2015  . Endometrial polyp 10/24/2014  . Fatigue 07/09/2014  . Hyperlipidemia   . [place] cancer[place] right [place] 02/01/2014  . Malignant melanoma of arm ([place] 11/23/2013  . Nasal septum ulceration 10/16/2013  . Seborrheic keratosis 10/16/2013  . Vitamin D deficiency 02/08/2013  . Sciatica of right side 01/24/2013  . Esophageal ulcer 01/24/2013  . Esophageal stenosis 01/24/2013  . Choroidal nevus of right eye 10/27/2012  . Impacted cerumen of right ear 10/11/2012  . Frequency 01/19/2012  . Gastroesophageal reflux disease 09/01/2011  . 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 Horseshoe retinal tear of right eye 08/03/2011  .  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 H/O cataract extraction 04/30/2011  . Borderline glaucoma steroid responder 04/30/2011  . Class 2 obesity due to excess calories without serious comorbidity with body mass index  (BMI) of 39.0 to 39.9 in adult 10/16/2008  . Breast cancer of lower-outer quadrant of right female breast ([place]) 08/29/2008    Carney Living  PT DPT  08/21/2020, 10:08 AM  [facility] [address] [place], [place], [address] Phone: [phone]   Fax:  [phone]  Name: TENISHA FLEECE MRN: [id number] Date of Birth: 12/20/1945

## 2020-08-22 ENCOUNTER — Encounter: Payer: Self-pay | Admitting: Physical Therapy

## 2020-08-22 ENCOUNTER — Other Ambulatory Visit: Payer: Self-pay

## 2020-08-22 ENCOUNTER — Ambulatory Visit: Payer: Medicare Other | Admitting: Physical Therapy

## 2020-08-22 DIAGNOSIS — M6281 Muscle weakness (generalized): Secondary | ICD-10-CM

## 2020-08-22 DIAGNOSIS — M25652 Stiffness of left hip, not elsewhere classified: Secondary | ICD-10-CM

## 2020-08-22 DIAGNOSIS — R262 Difficulty in walking, not elsewhere classified: Secondary | ICD-10-CM

## 2020-08-22 DIAGNOSIS — M25552 Pain in left hip: Secondary | ICD-10-CM | POA: Diagnosis not present

## 2020-08-22 DIAGNOSIS — M25551 Pain in right hip: Secondary | ICD-10-CM | POA: Diagnosis not present

## 2020-08-22 NOTE — Therapy (Signed)
[place], [place], [address] Phone: [phone]   Fax:  [phone]  Physical Therapy Treatment  Patient Details  Name: [name] MRN: [id number] Date of Birth: 1946/03/24 Referring Provider (PT): Dr    Gareth Morgan Date: 08/22/2020   PT End of Session - 08/22/20 1126    Visit Number 13    Number of Visits 22    Date for PT Re-Evaluation 09/10/20    Authorization Type MCR BCBS    PT Start Time 1100    PT Stop Time 1143    PT Time Calculation (min) 43 min    Equipment Utilized During Treatment Right knee immobilizer    Activity Tolerance Patient limited by fatigue;Patient tolerated treatment well           Past Medical History:  Diagnosis Date  . Asymptomatic varicose veins   . Cancer (HCC)    breast   . Diverticulosis   . DM type 2 (diabetes mellitus, type 2) ([place]) 10/23/2014  . Dysuria   . GERD (gastroesophageal reflux disease)   . Hiatal hernia   . Hyperlipidemia   . Malignant neoplasm of breast (female), unspecified site   . Migraine without aura, without mention of intractable migraine without mention of status migrainosus   . Other abnormal blood chemistry   . Pain in joint, pelvic region and thigh   . Shingles    in eyes  . Stricture and stenosis of esophagus   . Trigger finger (acquired)   . Unspecified cataract   . Unspecified glaucoma(365.9)     Past Surgical History:  Procedure Laterality Date  . APPENDECTOMY  1973   Dr. Linzie Collin  . BREAST LUMPECTOMY Right 08/29/2008   Dr. Erroll Luna  . ESOPHAGEAL DILATION    . NASAL POLYP EXCISION  10/2015  . POLYPECTOMY  11/20/14   Uterine Polyp Removed    There were no vitals filed for this visit.   Subjective Assessment - 08/22/20 1107    Subjective Patient has been a little more sore over the past few days. She has been trying to walk straighter.    Pertinent History DM, glaucoma, reports a myriad of other issues with her  health, GERD    How long can you stand comfortably? unable to stand to do ADL's for more then a few minutes    How long can you walk comfortably? limited sidtances    Patient Stated Goals have less pain and walk better    Currently in Pain? Yes    Pain Score 5     Pain Location Back    Pain Orientation Right    Pain Descriptors / Indicators Aching    Pain Type Chronic pain    Pain Radiating Towards around the SI joint    Pain Onset More than a month ago    Pain Frequency Constant    Aggravating Factors  standing and walking    Pain Relieving Factors rest and pain meds    Effect of Pain on Daily Activities pain walking                             [facility] Adult PT Treatment/Exercise - 08/22/20 0001      Ambulation/Gait   Gait Comments patient is trying to force her toe straight. She was advised to to walk naturally.      Lumbar Exercises: Aerobic   Nustep 6 min  Other Aerobic Exercise while monitoring motion      Lumbar Exercises: Seated   LAQ on Chair Limitations x20 2lb weight    Other Seated Lumbar Exercises seated clamshells 3x10 green  TB; bilateral ; hamstring curl red x20; Seated boilatera ER for posture 3x10; seated heel raise x20      Knee/Hip Exercises: Seated   Hamstring Limitations 3x10 red     Abd/Adduction Limitations hip abdcution yellow 3x10 red      Moist Heat Therapy   Moist Heat Location Lumbar Spine      Manual Therapy   Manual therapy comments trigger point release to lumbar spine, sacral area, and gluteal. Improved pain and stiffness after trigger point release                  PT Education - 08/22/20 1111    Education Details reviewed ggait pattern    Person(s) Educated Patient    Methods Explanation;Demonstration;Tactile cues;Verbal cues    Comprehension Verbalized understanding;Returned demonstration;Verbal cues required;Tactile cues required            PT Short Term Goals - 08/06/20 1004      PT SHORT TERM GOAL  #1   Title Patientwill transfer supine to sit  and sit to supine with supervision    Baseline able to perform    Time 4    Period Weeks    Status Achieved    Target Date 07/08/20      PT SHORT TERM GOAL #2   Title Patient will dmeonstrate full active hip flexion to 90 degrees    Baseline able to do in the nu-step    Period Weeks    Status Achieved    Target Date 07/08/20      PT SHORT TERM GOAL #3   Title Patient will ambaulte 300' without increased pain with good posture without cuing with LRAD    Baseline walking with cane but still postural deficits    Time 4    Period Weeks    Status On-going      PT SHORT TERM GOAL #4   Title Therapy will review FOTO    Baseline reviewed    Period Weeks    Status Achieved             PT Long Term Goals - 07/30/20 1531      PT LONG TERM GOAL #1   Title Patient will ambualte 3000' with LRAD without pain    Baseline continues to work on endruance    Time 8    Period Weeks    Status New      PT LONG TERM GOAL #2   Title Patient will demonstrate a 54 % on FOTO to demonstrate improved fucntion    Baseline 39 % ability 41 % expected    Time 8    Period Weeks    Status On-going      PT LONG TERM GOAL #3   Title Patient will transfer sit to stand without use of the hands without increased pain    Baseline still needs a small boost    Time 8    Period Weeks    Status On-going                 Plan - 08/22/20 1126    Clinical Impression Statement Patient had more diffuse pain today but less intesnity in the main spot ogf her pain. We worked on more stretching. She tolerated LE strengthening well. We  held on standing ewxericses 2nd to sorenmess. We will continue to progress as tolerated.    Personal Factors and Comorbidities Comorbidity 3+    Comorbidities GERD, DM, right hip replacement; bilateral LE edema    Examination-Activity Limitations Carry;Squat;Stairs;Dressing;Locomotion Level;Transfers;Bed Mobility     Examination-Participation Restrictions Meal Prep;Cleaning;Community Activity;Laundry;Yard Work    [employer]chant [employer] officer Evolving/Moderate complexity    Clinical Decision Making Moderate    Rehab Potential Good    PT Frequency 2x / week    PT Duration 6 weeks    PT Treatment/Interventions ADLs/Self Care Home Management;Cryotherapy;Electrical Stimulation;Iontophoresis 4mg /ml Dexamethasone;Moist Heat;Therapeutic activities;Functional mobility training;Gait training;Therapeutic exercise;Balance training;Neuromuscular re-education;Patient/family education;Dry needling;Ultrasound;Manual techniques;Taping    PT Next Visit Plan progress standing activity; contoinue with manual therapy if needed    PT Home Exercise Plan has a good exercise program at home.    Consulted and Agree with Plan of Care Patient           Patient will benefit from skilled therapeutic intervention in order to improve the following deficits and impairments:  Abnormal gait,Cardiopulmonary status limiting activity,Decreased activity tolerance,Decreased endurance,Decreased range of motion,Decreased strength,Difficulty walking,Increased edema,Decreased balance,Decreased mobility,Impaired flexibility,Obesity,Increased muscle spasms,Pain,Improper body mechanics,Decreased knowledge of use of DME  Visit Diagnosis: Pain in left hip  Stiffness of left hip, not elsewhere classified  Difficulty in walking, not elsewhere classified  Muscle weakness (generalized)     Problem List Patient Active Problem List   Diagnosis Date Noted  . Uveitic glaucoma of right eye, moderate stage 08/08/2020  . Pain and swelling of right lower leg 07/21/2020  . Aftercare following joint replacement surgery 01/23/2020  . Neurogenic claudication due to lumbar spinal stenosis 05/28/2019  . Right groin pain 05/28/2019  . Polymyalgia rheumatica ([place]) 10/09/2018  . BMI 40.0-44.9, adult ([place]) 10/09/2018  . Foraminal stenosis of lumbar  region 05/11/2018  . 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 On prednisone therapy 02/14/2018  . Herpes zoster 10/16/2017  . Chronic pain of both shoulders 08/10/2017  . Prediabetes 05/26/2017  . Primary osteoarthritis of both hips 03/15/2017  . Trochanteric bursitis of left hip 03/15/2017  . Recurrent epistaxis 12/09/2016  . Dysuria 11/03/2016  . Insomnia 11/03/2016  . Glaucoma 05/05/2016  . LPRD (laryngopharyngeal reflux disease) 01/26/2016  . Erosive esophagitis 11/27/2015  . Schatzki's ring of distal esophagus 11/27/2015  . Chronic sinusitis 10/16/2015  . Deviated nasal septum 10/16/2015  . Nasal polyp 10/16/2015  . Osteoporosis 08/15/2015  . Dysphagia, pharyngoesophageal phase 04/01/2015  . Endometrial polyp 10/24/2014  . Fatigue 07/09/2014  . Hyperlipidemia   . Breast cancer, right ([place]) 02/01/2014  . Malignant melanoma of arm ([place]) 11/23/2013  . Nasal septum ulceration 10/16/2013  . Seborrheic keratosis 10/16/2013  . Vitamin D deficiency 02/08/2013  . Sciatica of right side 01/24/2013  . Esophageal ulcer 01/24/2013  . Esophageal stenosis 01/24/2013  . Choroidal nevus of right eye 10/27/2012  . Impacted cerumen of right ear 10/11/2012  . Frequency 01/19/2012  . Gastroesophageal reflux disease 09/01/2011  . 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 Horseshoe retinal tear of right eye 08/03/2011  . 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 H/O cataract extraction 04/30/2011  . Borderline glaucoma steroid responder 04/30/2011  . Class 2 obesity due to excess calories without serious comorbidity with body mass index (BMI) of 39.0 to 39.9 in adult 10/16/2008  . Breast cancer of lower-outer quadrant of right female breast ([place]) 08/29/2008    Carney Living PT DPT  08/22/2020, 1:01 PM  [place], [place], [address] Phone: [phone]   Fax:   [phone]  Name: [name] MRN: [id number] Date of Birth: [birth year]

## 2020-08-25 ENCOUNTER — Encounter: Payer: Self-pay | Admitting: Physical Therapy

## 2020-08-25 ENCOUNTER — Ambulatory Visit (INDEPENDENT_AMBULATORY_CARE_PROVIDER_SITE_OTHER): Payer: Medicare Other | Admitting: Podiatry

## 2020-08-25 ENCOUNTER — Ambulatory Visit: Payer: Medicare Other | Admitting: Physical Therapy

## 2020-08-25 ENCOUNTER — Other Ambulatory Visit: Payer: Self-pay

## 2020-08-25 ENCOUNTER — Encounter: Payer: Self-pay | Admitting: Podiatry

## 2020-08-25 DIAGNOSIS — B351 Tinea unguium: Secondary | ICD-10-CM | POA: Diagnosis not present

## 2020-08-25 DIAGNOSIS — M25551 Pain in right hip: Secondary | ICD-10-CM | POA: Diagnosis not present

## 2020-08-25 DIAGNOSIS — M25652 Stiffness of left hip, not elsewhere classified: Secondary | ICD-10-CM

## 2020-08-25 DIAGNOSIS — M6281 Muscle weakness (generalized): Secondary | ICD-10-CM

## 2020-08-25 DIAGNOSIS — M25552 Pain in left hip: Secondary | ICD-10-CM

## 2020-08-25 DIAGNOSIS — R262 Difficulty in walking, not elsewhere classified: Secondary | ICD-10-CM

## 2020-08-25 DIAGNOSIS — M79674 Pain in right toe(s): Secondary | ICD-10-CM | POA: Diagnosis not present

## 2020-08-25 DIAGNOSIS — E1142 Type 2 diabetes mellitus with diabetic polyneuropathy: Secondary | ICD-10-CM

## 2020-08-25 DIAGNOSIS — M79675 Pain in left toe(s): Secondary | ICD-10-CM

## 2020-08-25 NOTE — Progress Notes (Signed)
Complaint:  Visit Type: Patient returns to my office for continued preventative foot care services. Complaint: Patient states" my third toenails both feet  have grown long and thick and become painful to walk and wear shoes"  The patient presents for preventative foot care services. No changes to ROS  Podiatric Exam: Vascular: dorsalis pedis and posterior tibial pulses are palpable bilateral. Capillary return is immediate. Temperature gradient is WNL. Skin turgor WNL  Sensorium: Normal Semmes Weinstein monofilament test. Normal tactile sensation bilaterally. Nail Exam: Pt has thick disfigured discolored nails with subungual debris noted third toenails both feet. Ulcer Exam: There is no evidence of ulcer or pre-ulcerative changes or infection. Orthopedic Exam: Muscle tone and strength are WNL. No limitations in general ROM. No crepitus or effusions noted. Foot type and digits show no abnormalities. Bony prominences are unremarkable. Skin: No Porokeratosis. No infection or ulcers  Diagnosis:  Onychomycosis, , Pain in right toe, pain in left toes  Treatment & Plan Procedures and Treatment: Consent by patient was obtained for treatment procedures.   Debridement of mycotic and hypertrophic toenails, 1 through 5 bilateral and clearing of subungual debris. No ulceration, no infection noted.  Return Visit-Office Procedure: Patient instructed to return to the office for a follow up visit 3 months for continued evaluation and treatment.    Maddyson Keil DPM 

## 2020-08-26 ENCOUNTER — Encounter: Payer: Self-pay | Admitting: Physical Therapy

## 2020-08-26 ENCOUNTER — Other Ambulatory Visit: Payer: Self-pay | Admitting: *Deleted

## 2020-08-26 DIAGNOSIS — Z23 Encounter for immunization: Secondary | ICD-10-CM | POA: Diagnosis not present

## 2020-08-26 DIAGNOSIS — M5431 Sciatica, right side: Secondary | ICD-10-CM

## 2020-08-26 MED ORDER — HYDROCODONE-ACETAMINOPHEN 10-325 MG PO TABS
1.0000 | ORAL_TABLET | Freq: Four times a day (QID) | ORAL | 0 refills | Status: DC | PRN
Start: 2020-08-26 — End: 2020-09-22

## 2020-08-26 NOTE — Therapy (Signed)
[place], [place], [address] Phone: [phone]   Fax:  [phone]  Physical Therapy Treatment  Patient Details  Name: [name] MRN: [id number] Date of Birth: [birth year] Referring Provider (PT): Dr    Gareth Morgan Date: 08/25/2020   PT End of Session - 08/25/20 1526    Visit Number 14    Number of Visits 22    Date for PT Re-Evaluation 09/10/20    Authorization Type MCR BCBS    PT Start Time 1147    PT Stop Time 1227    PT Time Calculation (min) 40 min    Activity Tolerance Patient tolerated treatment well    Behavior During Therapy [facility] for tasks assessed/performed           Past Medical History:  Diagnosis Date  . Asymptomatic varicose veins   . Cancer (HCC)    breast   . Diverticulosis   . DM type 2 (diabetes mellitus, type 2) ([place]) 10/23/2014  . Dysuria   . GERD (gastroesophageal reflux disease)   . Hiatal hernia   . Hyperlipidemia   . Malignant neoplasm of breast (female), unspecified site   . Migraine without aura, without mention of intractable migraine without mention of status migrainosus   . Other abnormal blood chemistry   . Pain in joint, pelvic region and thigh   . Shingles    in eyes  . Stricture and stenosis of esophagus   . Trigger finger (acquired)   . Unspecified cataract   . Unspecified glaucoma(365.9)     Past Surgical History:  Procedure Laterality Date  . APPENDECTOMY  1973   Dr. Linzie Collin  . BREAST LUMPECTOMY Right 08/29/2008   Dr. Erroll Luna  . ESOPHAGEAL DILATION    . NASAL POLYP EXCISION  10/2015  . POLYPECTOMY  11/20/14   Uterine Polyp Removed    There were no vitals filed for this visit.   Subjective Assessment - 08/25/20 1222    Subjective Back is feeling a little better but still painful. She is using her cane more. Her main problem is the pain in her lower back.    Pertinent History DM, glaucoma, reports a myriad of other issues with her  health, GERD    How long can you stand comfortably? unable to stand to do ADL's for more then a few minutes    How long can you walk comfortably? limited sidtances    Patient Stated Goals have less pain and walk better    Currently in Pain? Yes    Pain Score 3     Pain Location Back    Pain Orientation Right;Left    Pain Descriptors / Indicators Aching    Pain Onset More than a month ago    Pain Frequency Constant    Aggravating Factors  standing and walking    Pain Relieving Factors rest and pain meds    Effect of Pain on Daily Activities pain walking                             [facility] Adult PT Treatment/Exercise - 08/26/20 0001      Knee/Hip Exercises: Aerobic   Nustep 8 min L3      Knee/Hip Exercises: Standing   Heel Raises Limitations x20    Hip Flexion Limitations slow march in standing 2x15    Lateral Step Up Limitations 2x10 4 inch    Forward Step Up  Limitations 2x10 4 inch    Functional Squat Limitations 2x10       Knee/Hip Exercises: Seated   Long Arc Quad Limitations 3x10 red    Hamstring Limitations 3x10 red     Abd/Adduction Limitations hip abdcution yellow 3x10 red      Manual Therapy   Manual therapy comments trigger point release to lumbar spine, sacral area, and gluteal. Improved pain and stiffness after trigger point release                  PT Education - 08/25/20 1227    Education Details reviewed tehcnique with steps    Person(s) Educated Patient    Methods Explanation;Demonstration;Tactile cues;Verbal cues    Comprehension Verbalized understanding;Returned demonstration;Tactile cues required;Verbal cues required            PT Short Term Goals - 08/06/20 1004      PT SHORT TERM GOAL #1   Title Patientwill transfer supine to sit  and sit to supine with supervision    Baseline able to perform    Time 4    Period Weeks    Status Achieved    Target Date 07/08/20      PT SHORT TERM GOAL #2   Title Patient will  dmeonstrate full active hip flexion to 90 degrees    Baseline able to do in the nu-step    Period Weeks    Status Achieved    Target Date 07/08/20      PT SHORT TERM GOAL #3   Title Patient will ambaulte 300' without increased pain with good posture without cuing with LRAD    Baseline walking with cane but still postural deficits    Time 4    Period Weeks    Status On-going      PT SHORT TERM GOAL #4   Title Therapy will review FOTO    Baseline reviewed    Period Weeks    Status Achieved             PT Long Term Goals - 07/30/20 1531      PT LONG TERM GOAL #1   Title Patient will ambualte 3000' with LRAD without pain    Baseline continues to work on endruance    Time 8    Period Weeks    Status New      PT LONG TERM GOAL #2   Title Patient will demonstrate a 54 % on FOTO to demonstrate improved fucntion    Baseline 39 % ability 41 % expected    Time 8    Period Weeks    Status On-going      PT LONG TERM GOAL #3   Title Patient will transfer sit to stand without use of the hands without increased pain    Baseline still needs a small boost    Time 8    Period Weeks    Status On-going                 Plan - 08/25/20 1527    Clinical Impression Statement Patient was able to perfrom stair training without pain. Overall she is making good progress. She is having less back pain today so we focused more on standing exercises. we were able to perfrom less manual therapy on the spot in her lower back. We increased her time on the nu-step to continue toowkr on endurance.    Personal Factors and Comorbidities Comorbidity 3+    Comorbidities GERD, DM, right  hip replacement; bilateral LE edema    Examination-Activity Limitations Carry;Squat;Stairs;Dressing;Locomotion Level;Transfers;Bed Mobility    Examination-Participation Restrictions Meal Prep;Cleaning;Community Activity;Laundry;Yard Work    [employer]chant [employer] officer Evolving/Moderate complexity     Clinical Decision Making Moderate    Rehab Potential Good    PT Frequency 2x / week    PT Duration 6 weeks    PT Treatment/Interventions ADLs/Self Care Home Management;Cryotherapy;Electrical Stimulation;Iontophoresis 4mg /ml Dexamethasone;Moist Heat;Therapeutic activities;Functional mobility training;Gait training;Therapeutic exercise;Balance training;Neuromuscular re-education;Patient/family education;Dry needling;Ultrasound;Manual techniques;Taping    PT Next Visit Plan progress standing activity; contoinue with manual therapy if needed    PT Home Exercise Plan has a good exercise program at home.    Consulted and Agree with Plan of Care Patient           Patient will benefit from skilled therapeutic intervention in order to improve the following deficits and impairments:  Abnormal gait,Cardiopulmonary status limiting activity,Decreased activity tolerance,Decreased endurance,Decreased range of motion,Decreased strength,Difficulty walking,Increased edema,Decreased balance,Decreased mobility,Impaired flexibility,Obesity,Increased muscle spasms,Pain,Improper body mechanics,Decreased knowledge of use of DME  Visit Diagnosis: Pain in left hip  Stiffness of left hip, not elsewhere classified  Difficulty in walking, not elsewhere classified  Muscle weakness (generalized)  Pain in right hip     Problem List Patient Active Problem List   Diagnosis Date Noted  . Uveitic glaucoma of right eye, moderate stage 08/08/2020  . Pain and swelling of right lower leg 07/21/2020  . Aftercare following joint replacement surgery 01/23/2020  . Neurogenic claudication due to lumbar spinal stenosis 05/28/2019  . Right groin pain 05/28/2019  . Polymyalgia rheumatica ([place]) 10/09/2018  . BMI 40.0-44.9, adult ([place]) 10/09/2018  . Foraminal stenosis of lumbar region 05/11/2018  . 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 On prednisone therapy 02/14/2018  . Herpes zoster 10/16/2017   . Chronic pain of both shoulders 08/10/2017  . Prediabetes 05/26/2017  . Primary osteoarthritis of both hips 03/15/2017  . Trochanteric bursitis of left hip 03/15/2017  . Recurrent epistaxis 12/09/2016  . Dysuria 11/03/2016  . Insomnia 11/03/2016  . Glaucoma 05/05/2016  . LPRD (laryngopharyngeal reflux disease) 01/26/2016  . Erosive esophagitis 11/27/2015  . Schatzki's ring of distal esophagus 11/27/2015  . Chronic sinusitis 10/16/2015  . Deviated nasal septum 10/16/2015  . Nasal polyp 10/16/2015  . Osteoporosis 08/15/2015  . Dysphagia, pharyngoesophageal phase 04/01/2015  . Endometrial polyp 10/24/2014  . Fatigue 07/09/2014  . Hyperlipidemia   . Breast cancer, right ([place]) 02/01/2014  . Malignant melanoma of arm ([place]) 11/23/2013  . Nasal septum ulceration 10/16/2013  . Seborrheic keratosis 10/16/2013  . Vitamin D deficiency 02/08/2013  . Sciatica of right side 01/24/2013  . Esophageal ulcer 01/24/2013  . Esophageal stenosis 01/24/2013  . Choroidal nevus of right eye 10/27/2012  . Impacted cerumen of right ear 10/11/2012  . Frequency 01/19/2012  . Gastroesophageal reflux disease 09/01/2011  . 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 Horseshoe retinal tear of right eye 08/03/2011  . 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 H/O cataract extraction 04/30/2011  . Borderline glaucoma steroid responder 04/30/2011  . Class 2 obesity due to excess calories without serious comorbidity with body mass index (BMI) of 39.0 to 39.9 in adult 10/16/2008  . Breast cancer of lower-outer quadrant of right female breast ([place]) 08/29/2008    Carney Living PT DPT  08/26/2020, 9:29 AM  [facility] [address] [place], [place], [address] Phone: [phone]   Fax:  [phone]  Name: [name]  MRN: [id number] Date of Birth: [birth year]

## 2020-08-26 NOTE — Telephone Encounter (Signed)
Patient called requesting refill Epic LR: 07/24/2020 Contract needs to be updated, added to future appointment Pended Rx and sent to Dr. Mariea Clonts for approval.

## 2020-08-27 ENCOUNTER — Encounter: Payer: Self-pay | Admitting: Physical Therapy

## 2020-08-27 ENCOUNTER — Other Ambulatory Visit: Payer: Self-pay

## 2020-08-27 ENCOUNTER — Ambulatory Visit: Payer: Medicare Other | Attending: Sports Medicine | Admitting: Physical Therapy

## 2020-08-27 DIAGNOSIS — M6281 Muscle weakness (generalized): Secondary | ICD-10-CM | POA: Diagnosis not present

## 2020-08-27 DIAGNOSIS — M25551 Pain in right hip: Secondary | ICD-10-CM | POA: Diagnosis not present

## 2020-08-27 DIAGNOSIS — M25652 Stiffness of left hip, not elsewhere classified: Secondary | ICD-10-CM | POA: Diagnosis not present

## 2020-08-27 DIAGNOSIS — R262 Difficulty in walking, not elsewhere classified: Secondary | ICD-10-CM | POA: Diagnosis not present

## 2020-08-27 DIAGNOSIS — M25552 Pain in left hip: Secondary | ICD-10-CM | POA: Diagnosis not present

## 2020-08-28 ENCOUNTER — Encounter: Payer: Self-pay | Admitting: Physical Therapy

## 2020-08-28 DIAGNOSIS — Z20822 Contact with and (suspected) exposure to covid-19: Secondary | ICD-10-CM | POA: Diagnosis not present

## 2020-08-28 DIAGNOSIS — C799 Secondary malignant neoplasm of unspecified site: Secondary | ICD-10-CM | POA: Diagnosis not present

## 2020-08-28 DIAGNOSIS — Z471 Aftercare following joint replacement surgery: Secondary | ICD-10-CM | POA: Diagnosis not present

## 2020-08-28 DIAGNOSIS — Z170 Estrogen receptor positive status [ER+]: Secondary | ICD-10-CM | POA: Diagnosis not present

## 2020-08-28 DIAGNOSIS — C50411 Malignant neoplasm of upper-outer quadrant of right female breast: Secondary | ICD-10-CM | POA: Diagnosis not present

## 2020-08-28 DIAGNOSIS — Z01812 Encounter for preprocedural laboratory examination: Secondary | ICD-10-CM | POA: Diagnosis not present

## 2020-08-28 DIAGNOSIS — Z96642 Presence of left artificial hip joint: Secondary | ICD-10-CM | POA: Diagnosis not present

## 2020-08-28 NOTE — Therapy (Signed)
[place], [place], [address] Phone: [phone]   Fax:  [phone]  Physical Therapy Treatment  Patient Details  Name: [name] MRN: [id number] Date of Birth: [birth year] Referring Provider (PT): Dr    Gareth Morgan Date: 08/27/2020   PT End of Session - 08/27/20 1157    Visit Number 15    Number of Visits 22    Date for PT Re-Evaluation 09/10/20    Authorization Type MCR BCBS    PT Start Time 3817    PT Stop Time 1227    PT Time Calculation (min) 42 min    Activity Tolerance Patient tolerated treatment well    Behavior During Therapy [facility] for tasks assessed/performed           Past Medical History:  Diagnosis Date  . Asymptomatic varicose veins   . Cancer (HCC)    breast   . Diverticulosis   . DM type 2 (diabetes mellitus, type 2) ([place]) 10/23/2014  . Dysuria   . GERD (gastroesophageal reflux disease)   . Hiatal hernia   . Hyperlipidemia   . Malignant neoplasm of breast (female), unspecified site   . Migraine without aura, without mention of intractable migraine without mention of status migrainosus   . Other abnormal blood chemistry   . Pain in joint, pelvic region and thigh   . Shingles    in eyes  . Stricture and stenosis of esophagus   . Trigger finger (acquired)   . Unspecified cataract   . Unspecified glaucoma(365.9)     Past Surgical History:  Procedure Laterality Date  . APPENDECTOMY  1973   Dr. Linzie Collin  . BREAST LUMPECTOMY Right 08/29/2008   Dr. Erroll Luna  . ESOPHAGEAL DILATION    . NASAL POLYP EXCISION  10/2015  . POLYPECTOMY  11/20/14   Uterine Polyp Removed    There were no vitals filed for this visit.   Subjective Assessment - 08/27/20 1155    Subjective Patient had her first covid vaccine yesterday. She feels achy all over. She felt good after the last visit. She continues to have minor soreness in her SI joint.    Pertinent History DM, glaucoma, reports a  myriad of other issues with her health, GERD    How long can you stand comfortably? unable to stand to do ADL's for more then a few minutes    How long can you walk comfortably? limited sidtances    Patient Stated Goals have less pain and walk better    Currently in Pain? Yes    Pain Score 3     Pain Location Back    Pain Orientation Right;Left    Pain Descriptors / Indicators Aching    Pain Type Chronic pain    Pain Radiating Towards SI joint    Pain Onset More than a month ago    Pain Frequency Constant    Aggravating Factors  standing and walking    Pain Relieving Factors rest and pain meds    Effect of Pain on Daily Activities pain whilewalking                             [facility] Adult PT Treatment/Exercise - 08/28/20 0001      Neuro Re-ed    Neuro Re-ed Details  Narrow base on air-ex 3x30 sec; narrow base with eyes closed required min a to CGA with eyes cloased  Lumbar Exercises: Aerobic   Nustep 8 min L3      Lumbar Exercises: Seated   LAQ on Chair Limitations x20 2lb weight    Other Seated Lumbar Exercises seated clamshells 3x10 green  TB; bilateral ; hamstring curl red x20; Seated boilatera ER for posture 3x10; seated heel raise x20      Knee/Hip Exercises: Standing   Heel Raises Limitations x20    Hip Flexion Limitations slow march in standing 2x15      Knee/Hip Exercises: Seated   Long Arc Quad Limitations 3x10 red    Other Seated Knee/Hip Exercises seated ball roll and stretch to the left 5 x 5 sec hold    Hamstring Limitations 3x10 green bilateral      Manual Therapy   Manual therapy comments trigger point release to lumbar spine, sacral area, and gluteal. Improved pain and stiffness after trigger point release                  PT Education - 08/27/20 1156    Education Details HEP and symptom management    Person(s) Educated Patient    Methods Explanation;Demonstration;Tactile cues;Verbal cues    Comprehension Verbalized  understanding;Verbal cues required;Returned demonstration;Tactile cues required            PT Short Term Goals - 08/06/20 1004      PT SHORT TERM GOAL #1   Title Patientwill transfer supine to sit  and sit to supine with supervision    Baseline able to perform    Time 4    Period Weeks    Status Achieved    Target Date 07/08/20      PT SHORT TERM GOAL #2   Title Patient will dmeonstrate full active hip flexion to 90 degrees    Baseline able to do in the nu-step    Period Weeks    Status Achieved    Target Date 07/08/20      PT SHORT TERM GOAL #3   Title Patient will ambaulte 300' without increased pain with good posture without cuing with LRAD    Baseline walking with cane but still postural deficits    Time 4    Period Weeks    Status On-going      PT SHORT TERM GOAL #4   Title Therapy will review FOTO    Baseline reviewed    Period Weeks    Status Achieved             PT Long Term Goals - 08/28/20 1028      PT LONG TERM GOAL #1   Title Patient will ambualte 3000' with LRAD without pain    Baseline continues to work on endruance    Period Weeks    Status On-going      PT [place] #2   Title Patient will demonstrate a 54 % on FOTO to demonstrate improved fucntion    Baseline 39 % ability 41 % expected    Time 8    Period Weeks    Status On-going      PT LONG TERM GOAL #3   Title Patient will transfer sit to stand without use of the hands without increased pain    Baseline still needs a small boost    Time 8    Period Weeks    Status On-going      PT LONG TERM GOAL #4   Title Patient to be able to stand and ambulate without increase in pain  for at least 60 minutes in order to improve QOL and assist in return to prior level of activity     Time 8    Period Weeks    Status On-going                 Plan - 08/28/20 1022    Clinical Impression Statement Patient was fatigued and sore today 2nd to her covid shot. She is walking much better  with her cane at the proper height. He spot in her right scaral area is getting smaller and harder to find. SWe focused more on stability today using the air-ex mat. She did well. She tolerated all other ther-ex well. She continues to make good progress. She hopes to get off the cane, but for now with her back it may be best to continue using her cane.    Personal Factors and Comorbidities Comorbidity 3+    Comorbidities GERD, DM, right hip replacement; bilateral LE edema    Examination-Activity Limitations Carry;Squat;Stairs;Dressing;Locomotion Level;Transfers;Bed Mobility    Examination-Participation Restrictions Meal Prep;Cleaning;Community Activity;Laundry;Yard Work    [employer] officer Evolving/Moderate complexity    Clinical Decision Making Moderate    Rehab Potential Good    PT Frequency 2x / week    PT Duration 6 weeks    PT Treatment/Interventions ADLs/Self Care Home Management;Cryotherapy;Electrical Stimulation;Iontophoresis 15m/ml Dexamethasone;Moist Heat;Therapeutic activities;Functional mobility training;Gait training;Therapeutic exercise;Balance training;Neuromuscular re-education;Patient/family education;Dry needling;Ultrasound;Manual techniques;Taping    PT Next Visit Plan progress standing activity; contoinue with manual therapy if needed    PT Home Exercise Plan has a good exercise program at home.    Consulted and Agree with Plan of Care Patient           Patient will benefit from skilled therapeutic intervention in order to improve the following deficits and impairments:  Abnormal gait,Cardiopulmonary status limiting activity,Decreased activity tolerance,Decreased endurance,Decreased range of motion,Decreased strength,Difficulty walking,Increased edema,Decreased balance,Decreased mobility,Impaired flexibility,Obesity,Increased muscle spasms,Pain,Improper body mechanics,Decreased knowledge of use of DME  Visit Diagnosis: Pain in left hip  Stiffness of left  hip, not elsewhere classified  Difficulty in walking, not elsewhere classified  Muscle weakness (generalized)     Problem List Patient Active Problem List   Diagnosis Date Noted  . Uveitic glaucoma of right eye, moderate stage 08/08/2020  . Pain and swelling of right lower leg 07/21/2020  . Aftercare following joint replacement surgery 01/23/2020  . Neurogenic claudication due to lumbar spinal stenosis 05/28/2019  . Right groin pain 05/28/2019  . Pol[place]yalgia [place]umatica ([place] 10/09/2018  . BMI 40.[place]-44.9, [place] 10/09/2018  . Foraminal stenosis of lumbar region 05/11/2018  . 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 On prednisone therapy 02/14/2018  . Herpes zoster 10/16/2017  . Chronic pain of both shoulders 08/10/2017  . Prediabetes 05/26/2017  . Primary osteoarthritis of both hips 03/15/2017  . Trochanteric bursitis of left hip 03/15/2017  . Recurrent epistaxis 12/09/2016  . Dysuria 11/03/2016  . Insomnia 11/03/2016  . Glaucoma 05/05/2016  . LPRD (laryngopharyngeal reflux disease) 01/26/2016  . Erosive esophagitis 11/27/2015  . Schatzki's ring of distal esophagus 11/27/2015  . Chronic sinusitis 10/16/2015  . Deviated nasal septum 10/16/2015  . Nasal polyp 10/16/2015  . Osteoporosis 08/15/2015  . Dysphagia, pharyngoesophageal phase 04/01/2015  . Endometrial polyp 10/24/2014  . Fatigue 07/09/2014  . Hyperlipidemia   . Breast cancer, right ([place] 02/01/2014  . Malignant [place] of [place] ([place] 11/23/2013  . Nasal septum ulceration 10/16/2013  . Seborrheic keratosis 10/16/2013  . Vitamin D  deficiency 02/08/2013  . Sciatica of right side 01/24/2013  . Esophageal ulcer 01/24/2013  . Esophageal stenosis 01/24/2013  . Choroidal nevus of right eye 10/27/2012  . Impacted cerumen of right ear 10/11/2012  . Frequency 01/19/2012  . Gastroesophageal reflux disease 09/01/2011  . 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 Horseshoe  retinal tear of right eye 08/03/2011  . 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 H/O cataract extraction 04/30/2011  . Borderline glaucoma steroid responder 04/30/2011  . Class 2 obesity due to excess calories without serious comorbidity with body mass index (BMI) of 39.0 to 39.9 in adult 10/16/2008  . Breast cancer of lower-outer quadrant of right female breast ([place]) 08/29/2008    Carney Living 08/28/2020, 10:30 AM  [facility] [address] [place], [place], [address] Phone: [phone]   Fax:  [phone]  Name: [name] MRN: [id number] Date of Birth: August 23, 1945

## 2020-09-04 DIAGNOSIS — C792 Secondary malignant neoplasm of skin: Secondary | ICD-10-CM | POA: Diagnosis not present

## 2020-09-04 DIAGNOSIS — C4361 Malignant melanoma of right upper limb, including shoulder: Secondary | ICD-10-CM | POA: Diagnosis not present

## 2020-09-04 DIAGNOSIS — Z85820 Personal history of malignant melanoma of skin: Secondary | ICD-10-CM | POA: Diagnosis not present

## 2020-09-04 DIAGNOSIS — E119 Type 2 diabetes mellitus without complications: Secondary | ICD-10-CM | POA: Diagnosis not present

## 2020-09-04 DIAGNOSIS — Z170 Estrogen receptor positive status [ER+]: Secondary | ICD-10-CM | POA: Diagnosis not present

## 2020-09-04 DIAGNOSIS — C50411 Malignant neoplasm of upper-outer quadrant of right female breast: Secondary | ICD-10-CM | POA: Diagnosis not present

## 2020-09-05 DIAGNOSIS — R936 Abnormal findings on diagnostic imaging of limbs: Secondary | ICD-10-CM | POA: Diagnosis not present

## 2020-09-05 DIAGNOSIS — Z08 Encounter for follow-up examination after completed treatment for malignant neoplasm: Secondary | ICD-10-CM | POA: Diagnosis not present

## 2020-09-05 DIAGNOSIS — Z9889 Other specified postprocedural states: Secondary | ICD-10-CM | POA: Diagnosis not present

## 2020-09-05 DIAGNOSIS — G4700 Insomnia, unspecified: Secondary | ICD-10-CM | POA: Diagnosis not present

## 2020-09-05 DIAGNOSIS — M545 Low back pain, unspecified: Secondary | ICD-10-CM | POA: Diagnosis not present

## 2020-09-05 DIAGNOSIS — Z170 Estrogen receptor positive status [ER+]: Secondary | ICD-10-CM | POA: Diagnosis not present

## 2020-09-05 DIAGNOSIS — Z8669 Personal history of other diseases of the nervous system and sense organs: Secondary | ICD-10-CM | POA: Diagnosis not present

## 2020-09-05 DIAGNOSIS — C7989 Secondary malignant neoplasm of other specified sites: Secondary | ICD-10-CM | POA: Diagnosis not present

## 2020-09-05 DIAGNOSIS — Z9221 Personal history of antineoplastic chemotherapy: Secondary | ICD-10-CM | POA: Diagnosis not present

## 2020-09-05 DIAGNOSIS — Z85820 Personal history of malignant melanoma of skin: Secondary | ICD-10-CM | POA: Diagnosis not present

## 2020-09-05 DIAGNOSIS — R600 Localized edema: Secondary | ICD-10-CM | POA: Diagnosis not present

## 2020-09-05 DIAGNOSIS — Z9289 Personal history of other medical treatment: Secondary | ICD-10-CM | POA: Diagnosis not present

## 2020-09-05 DIAGNOSIS — C50411 Malignant neoplasm of upper-outer quadrant of right female breast: Secondary | ICD-10-CM | POA: Diagnosis not present

## 2020-09-05 DIAGNOSIS — C50511 Malignant neoplasm of lower-outer quadrant of right female breast: Secondary | ICD-10-CM | POA: Diagnosis not present

## 2020-09-05 DIAGNOSIS — Z853 Personal history of malignant neoplasm of breast: Secondary | ICD-10-CM | POA: Diagnosis not present

## 2020-09-05 DIAGNOSIS — C4361 Malignant melanoma of right upper limb, including shoulder: Secondary | ICD-10-CM | POA: Diagnosis not present

## 2020-09-05 DIAGNOSIS — Z92241 Personal history of systemic steroid therapy: Secondary | ICD-10-CM | POA: Diagnosis not present

## 2020-09-05 DIAGNOSIS — C799 Secondary malignant neoplasm of unspecified site: Secondary | ICD-10-CM | POA: Diagnosis not present

## 2020-09-05 DIAGNOSIS — C50911 Malignant neoplasm of unspecified site of right female breast: Secondary | ICD-10-CM | POA: Diagnosis not present

## 2020-09-05 DIAGNOSIS — R102 Pelvic and perineal pain: Secondary | ICD-10-CM | POA: Diagnosis not present

## 2020-09-05 DIAGNOSIS — M25519 Pain in unspecified shoulder: Secondary | ICD-10-CM | POA: Diagnosis not present

## 2020-09-05 DIAGNOSIS — Z9229 Personal history of other drug therapy: Secondary | ICD-10-CM | POA: Diagnosis not present

## 2020-09-05 DIAGNOSIS — R7309 Other abnormal glucose: Secondary | ICD-10-CM | POA: Diagnosis not present

## 2020-09-11 DIAGNOSIS — K222 Esophageal obstruction: Secondary | ICD-10-CM | POA: Diagnosis not present

## 2020-09-11 DIAGNOSIS — C799 Secondary malignant neoplasm of unspecified site: Secondary | ICD-10-CM | POA: Diagnosis not present

## 2020-09-11 DIAGNOSIS — R0789 Other chest pain: Secondary | ICD-10-CM | POA: Diagnosis not present

## 2020-09-11 DIAGNOSIS — R1319 Other dysphagia: Secondary | ICD-10-CM | POA: Diagnosis not present

## 2020-09-12 ENCOUNTER — Encounter: Payer: Self-pay | Admitting: Physical Therapy

## 2020-09-12 ENCOUNTER — Other Ambulatory Visit: Payer: Self-pay

## 2020-09-12 ENCOUNTER — Ambulatory Visit: Payer: Medicare Other | Admitting: Physical Therapy

## 2020-09-12 DIAGNOSIS — M25551 Pain in right hip: Secondary | ICD-10-CM | POA: Diagnosis not present

## 2020-09-12 DIAGNOSIS — R262 Difficulty in walking, not elsewhere classified: Secondary | ICD-10-CM

## 2020-09-12 DIAGNOSIS — M25552 Pain in left hip: Secondary | ICD-10-CM

## 2020-09-12 DIAGNOSIS — M6281 Muscle weakness (generalized): Secondary | ICD-10-CM

## 2020-09-12 DIAGNOSIS — M25652 Stiffness of left hip, not elsewhere classified: Secondary | ICD-10-CM

## 2020-09-13 NOTE — Therapy (Signed)
[place], [place], [address] Phone: [phone]   Fax:  [phone]  Physical Therapy Treatment/Recert   Patient Details  Name: [name] MRN: [id number] Date of Birth: [birth year] Referring Provider (PT): Dr    Gareth Morgan Date: 09/12/2020   PT End of Session - 09/12/20 1123    Visit Number 16    Number of Visits 22    Date for PT Re-Evaluation 10/25/20    Authorization Type MCR BCBS    PT Start Time 1100    PT Stop Time 1139    PT Time Calculation (min) 39 min    Activity Tolerance Patient tolerated treatment well    Behavior During Therapy [facility] for tasks assessed/performed           Past Medical History:  Diagnosis Date  . Asymptomatic varicose veins   . Cancer (HCC)    breast   . Diverticulosis   . DM type 2 (diabetes mellitus, type 2) ([place]) 10/23/2014  . Dysuria   . GERD (gastroesophageal reflux disease)   . Hiatal hernia   . Hyperlipidemia   . Malignant neoplasm of breast (female), unspecified site   . Migraine without aura, without mention of intractable migraine without mention of status migrainosus   . Other abnormal blood chemistry   . Pain in joint, pelvic region and thigh   . Shingles    in eyes  . Stricture and stenosis of esophagus   . Trigger finger (acquired)   . Unspecified cataract   . Unspecified glaucoma(365.9)     Past Surgical History:  Procedure Laterality Date  . APPENDECTOMY  1973   Dr. Linzie Collin  . BREAST LUMPECTOMY Right 08/29/2008   Dr. Erroll Luna  . ESOPHAGEAL DILATION    . NASAL POLYP EXCISION  10/2015  . POLYPECTOMY  11/20/14   Uterine Polyp Removed    There were no vitals filed for this visit.   Subjective Assessment - 09/12/20 1108    Subjective Patient continues to have low back pain when she is walking. She is using her cane at this time. She has had an MRI on her wrist. She is unsure what is going on with it.    Pertinent History DM,  glaucoma, reports a myriad of other issues with her health, GERD    How long can you stand comfortably? unable to stand to do ADL's for more then a few minutes    How long can you walk comfortably? limited sidtances    Patient Stated Goals have less pain and walk better    Currently in Pain? Yes    Pain Score 3     Pain Location Back    Pain Orientation Right;Mid    Pain Descriptors / Indicators Aching    Pain Type Chronic pain    Pain Onset More than a month ago    Pain Frequency Constant    Aggravating Factors  standign and walking    Pain Relieving Factors resat and pain meds    Effect of Pain on Daily Activities pain with walking    Multiple Pain Sites No              [facility] PT Assessment - 09/13/20 0001      Functional Tests   Functional tests Step up      Step Up   Comments difficulty with step up 4 inch needs to use UE to push up      ROM / Strength  AROM / PROM / Strength AROM      AROM   Overall AROM Comments can activtl hip to 90 degrees      Strength   Right Hip Flexion 5/5    Right Hip ABduction 5/5    Right Hip ADduction 5/5    Left Hip Flexion 5/5    Right Knee Flexion 5/5    Right Knee Extension 5/5    Left Knee Flexion 5/5    Left Knee Extension 5/5                         [facility] Adult PT Treatment/Exercise - 09/13/20 0001      Self-Care   Other Self-Care Comments  reviewed functional goals and FOTO score going forwar. Reviewed POC going forward.      Knee/Hip Exercises: Aerobic   Nustep 6 min L3      Knee/Hip Exercises: Standing   Heel Raises Limitations x20    Hip Flexion AROM;Stengthening;Both;3 sets;10 reps    Forward Step Up Limitations 2x10 4 inch      Knee/Hip Exercises: Seated   Long Arc Quad Limitations 3x10 red    Other Seated Knee/Hip Exercises seated ball roll and stretch to the left 5 x 5 sec hold    Hamstring Limitations 3x10 green bilateral      Manual Therapy   Manual therapy comments trigger point release  to lumbar spine, sacral area, and gluteal. Improved pain and stiffness after trigger point release                  PT Education - 09/12/20 1111    Education Details reviewed HEp and symptom management    Person(s) Educated Patient    Methods Explanation;Demonstration;Tactile cues;Verbal cues    Comprehension Verbalized understanding;Returned demonstration;Verbal cues required;Tactile cues required            PT Short Term Goals - 08/06/20 1004      PT SHORT TERM GOAL #1   Title Patientwill transfer supine to sit  and sit to supine with supervision    Baseline able to perform    Time 4    Period Weeks    Status Achieved    Target Date 07/08/20      PT SHORT TERM GOAL #2   Title Patient will dmeonstrate full active hip flexion to 90 degrees    Baseline able to do in the nu-step    Period Weeks    Status Achieved    Target Date 07/08/20      PT SHORT TERM GOAL #3   Title Patient will ambaulte 300' without increased pain with good posture without cuing with LRAD    Baseline walking with cane but still postural deficits    Time 4    Period Weeks    Status On-going      PT SHORT TERM GOAL #4   Title Therapy will review FOTO    Baseline reviewed    Period Weeks    Status Achieved             PT Long Term Goals - 08/28/20 1028      PT LONG TERM GOAL #1   Title Patient will ambualte 3000' with LRAD without pain    Baseline continues to work on endruance    Period Weeks    Status On-going      PT [place] #2   Title Patient will demonstrate a 54 % on  FOTO to demonstrate improved fucntion    Baseline 39 % ability 41 % expected    Time 8    Period Weeks    Status On-going      PT LONG TERM GOAL #3   Title Patient will transfer sit to stand without use of the hands without increased pain    Baseline still needs a small boost    Time 8    Period Weeks    Status On-going      PT LONG TERM GOAL #4   Title Patient to be able to stand and ambulate  without increase in pain for at least 60 minutes in order to improve QOL and assist in return to prior level of activity     Time 8    Period Weeks    Status On-going                 Plan - 09/12/20 1123    Clinical Impression Statement Patients C/O continues to be an area of pain in the right side of her lower back. Her hip movement has improved significantly. She has increased pin in her back when she stands and walk.s. We have not seen her for a few weeks 2nd to scheduling issues with the therapist. In that times he has cointinued her exercises at home. We were able to work on standing exercises today. We will continue 1W6 to continue to work on standing exercises and hipefull decrease the pain in her lower back with walking. Her main goal that we will focus on will be going up and down steps.    Comorbidities GERD, DM, right hip replacement; bilateral LE edema    Examination-Activity Limitations Carry;Squat;Stairs;Dressing;Locomotion Level;Transfers;Bed Mobility    Examination-Participation Restrictions Meal Prep;Cleaning;Community Activity;Laundry;Yard Work    Stability/Clinical Decision Making Evolving/Moderate complexity    Clinical Decision Making Moderate    PT Frequency 1x / week    PT Duration 6 weeks    PT Treatment/Interventions ADLs/Self Care Home Management;Cryotherapy;Electrical Stimulation;Iontophoresis 4mg /ml Dexamethasone;Moist Heat;Therapeutic activities;Functional mobility training;Gait training;Therapeutic exercise;Balance training;Neuromuscular re-education;Patient/family education;Dry needling;Ultrasound;Manual techniques;Taping    PT Next Visit Plan continue to progress steps and standing exercsies    PT Home Exercise Plan has a good exercise program at home.    Consulted and Agree with Plan of Care Patient           Patient will benefit from skilled therapeutic intervention in order to improve the following deficits and impairments:  Abnormal  gait,Cardiopulmonary status limiting activity,Decreased activity tolerance,Decreased endurance,Decreased range of motion,Decreased strength,Difficulty walking,Increased edema,Decreased balance,Decreased mobility,Impaired flexibility,Obesity,Increased muscle spasms,Pain,Improper body mechanics,Decreased knowledge of use of DME  Visit Diagnosis: Pain in left hip - Plan: PT plan of care cert/re-cert  Stiffness of left hip, not elsewhere classified - Plan: PT plan of care cert/re-cert  Difficulty in walking, not elsewhere classified - Plan: PT plan of care cert/re-cert  Muscle weakness (generalized) - Plan: PT plan of care cert/re-cert     Problem List Patient Active Problem List   Diagnosis Date Noted  . Uveitic glaucoma of right eye, moderate stage 08/08/2020  . Pain and swelling of right lower leg 07/21/2020  . Aftercare following joint replacement surgery 01/23/2020  . Neurogenic claudication due to lumbar spinal stenosis 05/28/2019  . Right groin pain 05/28/2019  . Polymyalgia rheumatica ([place]) 10/09/2018  . BMI 40.0-44.9, adult ([place]) 10/09/2018  . Foraminal stenosis of lumbar region 05/11/2018  . 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 On prednisone therapy  02/14/2018  . Herpes zoster 10/16/2017  . Chronic pain of both shoulders 08/10/2017  . Prediabetes 05/26/2017  . Primary osteoarthritis of both hips 03/15/2017  . Trochanteric bursitis of left hip 03/15/2017  . Recurrent epistaxis 12/09/2016  . Dysuria 11/03/2016  . Insomnia 11/03/2016  . Glaucoma 05/05/2016  . LPRD (laryngopharyngeal reflux disease) 01/26/2016  . Erosive esophagitis 11/27/2015  . Schatzki's ring of distal esophagus 11/27/2015  . Chronic sinusitis 10/16/2015  . Deviated nasal septum 10/16/2015  . Nasal polyp 10/16/2015  . Osteoporosis 08/15/2015  . Dysphagia, pharyngoesophageal phase 04/01/2015  . Endometrial polyp 10/24/2014  . Fatigue 07/09/2014  . Hyperlipidemia   .  Breast cancer, right ([place]) 02/01/2014  . Malignant melanoma of arm ([place]) 11/23/2013  . Nasal septum ulceration 10/16/2013  . Seborrheic keratosis 10/16/2013  . Vitamin D deficiency 02/08/2013  . Sciatica of right side 01/24/2013  . Esophageal ulcer 01/24/2013  . Esophageal stenosis 01/24/2013  . Choroidal nevus of right eye 10/27/2012  . Impacted cerumen of right ear 10/11/2012  . Frequency 01/19/2012  . Gastroesophageal reflux disease 09/01/2011  . 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 Horseshoe retinal tear of right eye 08/03/2011  . 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 H/O cataract extraction 04/30/2011  . Borderline glaucoma steroid responder 04/30/2011  . Class 2 obesity due to excess calories without serious comorbidity with body mass index (BMI) of 39.0 to 39.9 in adult 10/16/2008  . Breast cancer of lower-outer quadrant of right female breast ([place]) 08/29/2008    Carney Living 09/13/2020, 10:35 AM  [facility] [address] [place] [place], [place], [address] Phone: [phone]   Fax:  [phone]  Name: [name] MRN: [id number] Date of Birth: 1946/02/13

## 2020-09-15 ENCOUNTER — Encounter: Payer: Self-pay | Admitting: Internal Medicine

## 2020-09-17 ENCOUNTER — Other Ambulatory Visit: Payer: Self-pay

## 2020-09-17 ENCOUNTER — Other Ambulatory Visit: Payer: Self-pay | Admitting: Internal Medicine

## 2020-09-17 ENCOUNTER — Ambulatory Visit: Payer: Medicare Other | Admitting: Physical Therapy

## 2020-09-17 DIAGNOSIS — M6281 Muscle weakness (generalized): Secondary | ICD-10-CM

## 2020-09-17 DIAGNOSIS — M25652 Stiffness of left hip, not elsewhere classified: Secondary | ICD-10-CM

## 2020-09-17 DIAGNOSIS — M25551 Pain in right hip: Secondary | ICD-10-CM | POA: Diagnosis not present

## 2020-09-17 DIAGNOSIS — R262 Difficulty in walking, not elsewhere classified: Secondary | ICD-10-CM

## 2020-09-17 DIAGNOSIS — E7849 Other hyperlipidemia: Secondary | ICD-10-CM

## 2020-09-17 DIAGNOSIS — M25552 Pain in left hip: Secondary | ICD-10-CM | POA: Diagnosis not present

## 2020-09-17 DIAGNOSIS — R7303 Prediabetes: Secondary | ICD-10-CM

## 2020-09-18 ENCOUNTER — Encounter: Payer: Self-pay | Admitting: Physical Therapy

## 2020-09-18 NOTE — Therapy (Signed)
[facility] [address] [place], [place], [address] Phone: [phone]   Fax:  [phone]  Physical Therapy Treatment  Patient Details  Name: [name] MRN: [id number] Date of Birth: November 22, 1945 Referring Provider (PT): Dr    Neta Mends Date: 09/17/2020   PT End of Session - 09/18/20 0617    Visit Number 17    Number of Visits 22    Date for PT Re-Evaluation 10/25/20    Authorization Type MCR BCBS    PT Start Time 1145    PT Stop Time 1226    PT Time Calculation (min) 41 min    Activity Tolerance Patient tolerated treatment well    Behavior During Therapy [facility] for tasks assessed/performed           Past Medical History:  Diagnosis Date  . Asymptomatic varicose veins   . Cancer (HCC)    breast   . Diverticulosis   . DM type 2 (diabetes mellitus, type 2) (HCC) 10/23/2014  . Dysuria   . GERD (gastroesophageal reflux disease)   . Hiatal hernia   . Hyperlipidemia   . Malignant neoplasm of breast (female), unspecified site   . Migraine without aura, without mention of intractable migraine without mention of status migrainosus   . Other abnormal blood chemistry   . Pain in joint, pelvic region and thigh   . Shingles    in eyes  . Stricture and stenosis of esophagus   . Trigger finger (acquired)   . Unspecified cataract   . Unspecified glaucoma(365.9)     Past Surgical History:  Procedure Laterality Date  . APPENDECTOMY  1973   Dr. Wilber Bihari  . BREAST LUMPECTOMY Right 08/29/2008   Dr. Harriette Bouillon  . ESOPHAGEAL DILATION    . NASAL POLYP EXCISION  10/2015  . POLYPECTOMY  11/20/14   Uterine Polyp Removed    There were no vitals filed for this visit.   Subjective Assessment - 09/18/20 0612    Subjective Patient reports she is stiff today. She woke up feeling like oth his and her back are stiff. Her low back has been feeling a little better. She continues to have some days when she doesn't have lower back pain.  She has minor pain today.    Pertinent History DM, glaucoma, reports a myriad of other issues with her health, GERD    How long can you stand comfortably? unable to stand to do ADL's for more then a few minutes    How long can you walk comfortably? limited sidtances    Patient Stated Goals have less pain and walk better    Currently in Pain? Yes    Pain Score 3     Pain Location Back    Pain Orientation Right;Mid    Pain Descriptors / Indicators Aching    Pain Type Chronic pain    Pain Radiating Towards SI joint    Pain Onset More than a month ago    Pain Frequency Constant    Aggravating Factors  standing and walking    Pain Relieving Factors rest and pain meds    Effect of Pain on Daily Activities pain when walking                             [facility] Adult PT Treatment/Exercise - 09/18/20 0001      Knee/Hip Exercises: Standing   Heel Raises Limitations x20    Hip Flexion  AROM;Stengthening;Both;3 sets;10 reps    Lateral Step Up Limitations 2x10 4 inch with left    Forward Step Up Limitations 2x10 4 inch    Other Standing Knee Exercises slow march x20 with min UE hand gold      Knee/Hip Exercises: Seated   Other Seated Knee/Hip Exercises seated clamshell green 3x10    Other Seated Knee/Hip Exercises seated ball roll and stretch to the left 5 x 5 sec hold      Manual Therapy   Manual therapy comments trigger point release to lumbar spine, sacral area, and gluteal. Improved pain and stiffness after trigger point release                  PT Education - 09/18/20 0616    Education Details reviewed steps and progression onf activity    Person(s) Educated Patient    Methods Explanation;Demonstration;Tactile cues;Verbal cues    Comprehension Verbalized understanding;Returned demonstration;Verbal cues required;Tactile cues required            PT Short Term Goals - 08/06/20 1004      PT SHORT TERM GOAL #1   Title Patientwill transfer supine to sit  and  sit to supine with supervision    Baseline able to perform    Time 4    Period Weeks    Status Achieved    Target Date 07/08/20      PT SHORT TERM GOAL #2   Title Patient will dmeonstrate full active hip flexion to 90 degrees    Baseline able to do in the nu-step    Period Weeks    Status Achieved    Target Date 07/08/20      PT SHORT TERM GOAL #3   Title Patient will ambaulte 300' without increased pain with good posture without cuing with LRAD    Baseline walking with cane but still postural deficits    Time 4    Period Weeks    Status On-going      PT SHORT TERM GOAL #4   Title Therapy will review FOTO    Baseline reviewed    Period Weeks    Status Achieved             PT Long Term Goals - 08/28/20 1028      PT LONG TERM GOAL #1   Title Patient will ambualte 3000' with LRAD without pain    Baseline continues to work on endruance    Period Weeks    Status On-going      PT [place] #2   Title Patient will demonstrate a 54 % on FOTO to demonstrate improved fucntion    Baseline 39 % ability 41 % expected    Time 8    Period Weeks    Status On-going      PT LONG TERM GOAL #3   Title Patient will transfer sit to stand without use of the hands without increased pain    Baseline still needs a small boost    Time 8    Period Weeks    Status On-going      PT LONG TERM GOAL #4   Title Patient to be able to stand and ambulate without increase in pain for at least 60 minutes in order to improve QOL and assist in return to prior level of activity     Time 8    Period Weeks    Status On-going  Plan - 09/18/20 0617    Clinical Impression Statement Depsite stiffness thepatientwas able to perfrom standing exercises. We will continue to progress stair height over the next fewvisits as tolerated. She tolerated ther-ex well. Her trigger point in her back is improved today.    Personal Factors and Comorbidities Comorbidity 3+    Comorbidities  GERD, DM, right hip replacement; bilateral LE edema    Examination-Activity Limitations Carry;Squat;Stairs;Dressing;Locomotion Level;Transfers;Bed Mobility    Examination-Participation Restrictions Meal Prep;Cleaning;Community Activity;Laundry;Yard Work    [employer] Evolving/Moderate complexity    Clinical Decision Making Moderate    Rehab Potential Good    PT Frequency 1x / week    PT Duration 6 weeks    PT Treatment/Interventions ADLs/Self Care Home Management;Cryotherapy;Electrical Stimulation;Iontophoresis 4mg /ml Dexamethasone;Moist Heat;Therapeutic activities;Functional mobility training;Gait training;Therapeutic exercise;Balance training;Neuromuscular re-education;Patient/family education;Dry needling;Ultrasound;Manual techniques;Taping    PT Next Visit Plan continue to progress steps and standing exercsies    PT Home Exercise Plan has a good exercise program at home.    Consulted and Agree with Plan of Care Patient           Patient will benefit from skilled therapeutic intervention in order to improve the following deficits and impairments:  Abnormal gait,Cardiopulmonary status limiting activity,Decreased activity tolerance,Decreased endurance,Decreased range of motion,Decreased strength,Difficulty walking,Increased edema,Decreased balance,Decreased mobility,Impaired flexibility,Obesity,Increased muscle spasms,Pain,Improper body mechanics,Decreased knowledge of use of DME  Visit Diagnosis: Pain in left hip  Stiffness of left hip, not elsewhere classified  Difficulty in walking, not elsewhere classified  Muscle weakness (generalized)  Pain in right hip     Problem List Patient Active Problem List   Diagnosis Date Noted  . Uveitic glaucoma of right eye, moderate stage 08/08/2020  . Pain and swelling of right lower leg 07/21/2020  . Aftercare following joint replacement surgery 01/23/2020  . Neurogenic claudication due to lumbar spinal stenosis  05/28/2019  . Right groin pain 05/28/2019  . Polymyalgia rheumatica ([place]) 10/09/2018  . BMI 40.0-44.9, adult ([place]) 10/09/2018  . Foraminal stenosis of lumbar region 05/11/2018  . 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 On prednisone therapy 02/14/2018  . Herpes zoster 10/16/2017  . Chronic pain of both shoulders 08/10/2017  . Prediabetes 05/26/2017  . Primary osteoarthritis of both hips 03/15/2017  . Trochanteric bursitis of left hip 03/15/2017  . Recurrent epistaxis 12/09/2016  . Dysuria 11/03/2016  . Insomnia 11/03/2016  . Glaucoma 05/05/2016  . LPRD (laryngopharyngeal reflux disease) 01/26/2016  . Erosive esophagitis 11/27/2015  . Schatzki's ring of distal esophagus 11/27/2015  . Chronic sinusitis 10/16/2015  . Deviated nasal septum 10/16/2015  . Nasal polyp 10/16/2015  . Osteoporosis 08/15/2015  . Dysphagia, pharyngoesophageal phase 04/01/2015  . Endometrial polyp 10/24/2014  . Fatigue 07/09/2014  . Hyperlipidemia   . Breast cancer, right ([place]) 02/01/2014  . Malignant melanoma of arm ([place]) 11/23/2013  . Nasal septum ulceration 10/16/2013  . Seborrheic keratosis 10/16/2013  . Vitamin D deficiency 02/08/2013  . Sciatica of right side 01/24/2013  . Esophageal ulcer 01/24/2013  . Esophageal stenosis 01/24/2013  . Choroidal nevus of right eye 10/27/2012  . Impacted cerumen of right ear 10/11/2012  . Frequency 01/19/2012  . Gastroesophageal reflux disease 09/01/2011  . 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 Horseshoe retinal tear of right eye 08/03/2011  . 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 H/O cataract extraction 04/30/2011  . Borderline glaucoma steroid responder 04/30/2011  . Class 2 obesity due to excess calories without serious comorbidity with body mass index (BMI)  of 39.0 to 39.9 in adult 10/16/2008  . Breast cancer of lower-outer quadrant of right female breast ([place]) 08/29/2008    Carney Living PT  DPT 09/18/2020, 6:25 AM  [facility] [address] [place], [place], [address] Phone: [phone]   Fax:  [phone]  Name: [name] MRN: [id number] Date of Birth: 11/05/1945

## 2020-09-22 ENCOUNTER — Other Ambulatory Visit: Payer: Medicare Other

## 2020-09-22 ENCOUNTER — Other Ambulatory Visit: Payer: Self-pay

## 2020-09-22 ENCOUNTER — Other Ambulatory Visit: Payer: Self-pay | Admitting: *Deleted

## 2020-09-22 DIAGNOSIS — R7303 Prediabetes: Secondary | ICD-10-CM | POA: Diagnosis not present

## 2020-09-22 DIAGNOSIS — M5431 Sciatica, right side: Secondary | ICD-10-CM

## 2020-09-22 DIAGNOSIS — E7849 Other hyperlipidemia: Secondary | ICD-10-CM

## 2020-09-22 MED ORDER — HYDROCODONE-ACETAMINOPHEN 10-325 MG PO TABS: 1.0000 | ORAL_TABLET | Freq: Four times a day (QID) | ORAL | 0 refills | Status: DC | PRN

## 2020-09-22 NOTE — Telephone Encounter (Signed)
Patient requested refill Needs contract updated, note added to upcoming appointment Epic LR: 08/26/2020 Pended Rx and sent to Dr. Mariea Clonts for approval.

## 2020-09-23 LAB — LIPID PANEL
Cholesterol: 265 mg/dL — ABNORMAL HIGH (ref <200)
HDL: 67 mg/dL (ref >=50)
LDL Cholesterol (Calc): 159 mg/dL (calc) — ABNORMAL HIGH
Non-HDL Cholesterol (Calc): 198 mg/dL (calc) — ABNORMAL HIGH (ref <130)
Total CHOL/HDL Ratio: 4 (calc) (ref <5.0)
Triglycerides: 217 mg/dL — ABNORMAL HIGH (ref <150)

## 2020-09-23 LAB — CBC WITH DIFFERENTIAL/PLATELET
Absolute Monocytes: 512 cells/uL (ref 200–950)
Basophils Absolute: 72 cells/uL (ref 0–200)
Basophils Relative: 0.9 %
Eosinophils Absolute: 112 cells/uL (ref 15–500)
Eosinophils Relative: 1.4 %
HCT: 39.5 % (ref 35.0–45.0)
Hemoglobin: 13.2 g/dL (ref 11.7–15.5)
Lymphs Abs: 1128 cells/uL (ref 850–3900)
MCH: 29.9 pg (ref 27.0–33.0)
MCHC: 33.4 g/dL (ref 32.0–36.0)
MCV: 89.4 fL (ref 80.0–100.0)
MPV: 10.4 fL (ref 7.5–12.5)
Monocytes Relative: 6.4 %
Neutro Abs: 6176 cells/uL (ref 1500–7800)
Neutrophils Relative %: 77.2 %
Platelets: 308 10*3/uL (ref 140–400)
RBC: 4.42 10*6/uL (ref 3.80–5.10)
RDW: 13.7 % (ref 11.0–15.0)
Total Lymphocyte: 14.1 %
WBC: 8 10*3/uL (ref 3.8–10.8)

## 2020-09-23 LAB — COMPLETE METABOLIC PANEL WITH GFR
AG Ratio: 1.3 (calc) (ref 1.0–2.5)
ALT: 13 U/L (ref 6–29)
AST: 16 U/L (ref 10–35)
Albumin: 4 g/dL (ref 3.6–5.1)
Alkaline phosphatase (APISO): 85 U/L (ref 37–153)
BUN/Creatinine Ratio: 22 (calc) (ref 6–22)
BUN: 12 mg/dL (ref 7–25)
CO2: 29 mmol/L (ref 20–32)
Calcium: 9.7 mg/dL (ref 8.6–10.4)
Chloride: 98 mmol/L (ref 98–110)
Creat: 0.55 mg/dL — ABNORMAL LOW (ref 0.60–0.93)
GFR, Est African American: 106 mL/min/{1.73_m2} (ref >=60)
GFR, Est Non African American: 92 mL/min/{1.73_m2} (ref >=60)
Globulin: 3 g/dL (calc) (ref 1.9–3.7)
Glucose, Bld: 116 mg/dL — ABNORMAL HIGH (ref 65–99)
Potassium: 4.3 mmol/L (ref 3.5–5.3)
Sodium: 139 mmol/L (ref 135–146)
Total Bilirubin: 0.5 mg/dL (ref 0.2–1.2)
Total Protein: 7 g/dL (ref 6.1–8.1)

## 2020-09-23 LAB — HEMOGLOBIN A1C
Hgb A1c MFr Bld: 6.3 % of total Hgb — ABNORMAL HIGH (ref <5.7)
Mean Plasma Glucose: 134 mg/dL
eAG (mmol/L): 7.4 mmol/L

## 2020-09-23 NOTE — Progress Notes (Signed)
Bad cholesterol and sugar have trended up.  Other labs ok.  We'll discuss when I see her.

## 2020-09-24 ENCOUNTER — Encounter: Payer: Self-pay | Admitting: Physical Therapy

## 2020-09-24 ENCOUNTER — Ambulatory Visit: Payer: Medicare Other | Attending: Sports Medicine | Admitting: Physical Therapy

## 2020-09-24 ENCOUNTER — Other Ambulatory Visit: Payer: Self-pay

## 2020-09-24 DIAGNOSIS — M25652 Stiffness of left hip, not elsewhere classified: Secondary | ICD-10-CM | POA: Diagnosis not present

## 2020-09-24 DIAGNOSIS — R262 Difficulty in walking, not elsewhere classified: Secondary | ICD-10-CM

## 2020-09-24 DIAGNOSIS — M25552 Pain in left hip: Secondary | ICD-10-CM | POA: Diagnosis not present

## 2020-09-24 DIAGNOSIS — M25551 Pain in right hip: Secondary | ICD-10-CM | POA: Insufficient documentation

## 2020-09-24 DIAGNOSIS — M6281 Muscle weakness (generalized): Secondary | ICD-10-CM | POA: Insufficient documentation

## 2020-09-25 ENCOUNTER — Ambulatory Visit (INDEPENDENT_AMBULATORY_CARE_PROVIDER_SITE_OTHER): Payer: Medicare Other | Admitting: Internal Medicine

## 2020-09-25 ENCOUNTER — Other Ambulatory Visit: Payer: Self-pay

## 2020-09-25 ENCOUNTER — Encounter: Payer: Self-pay | Admitting: Internal Medicine

## 2020-09-25 VITALS — BP 144/82 | HR 91 | Temp 96.9°F | Ht 64.0 in | Wt 269.0 lb

## 2020-09-25 DIAGNOSIS — R7303 Prediabetes: Secondary | ICD-10-CM

## 2020-09-25 DIAGNOSIS — E7849 Other hyperlipidemia: Secondary | ICD-10-CM | POA: Diagnosis not present

## 2020-09-25 DIAGNOSIS — M545 Low back pain, unspecified: Secondary | ICD-10-CM | POA: Diagnosis not present

## 2020-09-25 DIAGNOSIS — Z1159 Encounter for screening for other viral diseases: Secondary | ICD-10-CM | POA: Diagnosis not present

## 2020-09-25 DIAGNOSIS — R1314 Dysphagia, pharyngoesophageal phase: Secondary | ICD-10-CM

## 2020-09-25 DIAGNOSIS — G8929 Other chronic pain: Secondary | ICD-10-CM

## 2020-09-25 DIAGNOSIS — M353 Polymyalgia rheumatica: Secondary | ICD-10-CM | POA: Diagnosis not present

## 2020-09-25 NOTE — Progress Notes (Signed)
Location:  [facility] clinic Provider:  Elisama Thissen L. Mariea Clonts, D.O., C.M.D.   Goals of Care:  Advanced Directives 09/25/2020  Does Patient Have a Medical Advance Directive? No  Does patient want to make changes to medical advance directive? No - Patient declined  Would patient like information on creating a medical advance directive? -     Chief Complaint  Patient presents with  . Medical Management of Chronic Issues    5 month follow-up. Discuss need for Hep c screening, colonoscopy, Malb, and TD/tdap    HPI: Patient is a 75 y.o. female seen today for medical management of chronic diseases.    She's had her esophaugs stretched twice, ut over the hoolidays, it felt like maybe it was starting up. Has had schatzki's ring.  Was eating pork and it was dry.  She thought it was going to stick.  She started getting a pain b/w her breasts and under her right breast.  She'd not had that before.  It had been she'd have trouble swallowing and would vomit, but this was different.  She called EMS and BP was 220/120.  EKG was "sort of nuts".  She'd isolated herself.  Enzymes ok.  She kept hurting and was vomiting up saliva.  Finally saw doctor and she shared her family history of aortic aneurysm.  They finally gave her pain medicine.  She also was withdrawing from her norco.   13 hrs later.  Still no explanation.  Made appt with GI and saw a female physician.  Needs endoscopy and now will have cardiac clearance visit first next week.  Has been taking omeprazole. She's had no trouble eating though since.  Had MRI of her wrist two weeks ago.  melanoma is ok, but there's an are of dead bone--Dr. Mylo Red looked at it.  She thought maybe it came from fosamax for 6 yrs or steroids.      She's had both hip surgeries done.  She was talking about how her hips went bad and if she had   Having cramps in her legs--pickles, mustard, etc, not helping.  Muscle relaxers not helping.  Swelling in legs and feet is now constant.   Says it gets even worse than it currently is.  Since the left hip, the left leg has gotten much worse.  Right has swollen off and on forever.  diuretic didn't seem to do a lot before.  Says she gets 2 good pees out of it and that's it.    Hips are better.  She hs more trouble now in her back.  Using a cane--PT says she rocks out when walking.  Had gotten to where she could go as long as 12 hrs with norco.  Now she's back to nearly 6-8 hrs b/w b/c of her back.  They tried to do the skier poles instead of single cane yesterday.     Did have echo with [facility] a couple of years ago.  Cannot get current compression hose  On.  Going to do zipper ones.  She is down to 40m on prednisone.   Last ESR was 19 4/21.      bp ranges from low at times to 1025-852systolic.  Dr. AElder Loveis GI doctor.    Reviewed that cholesterol and sugar have trended up.  She was trying to eat a more keto diet on her own, but has had too much cheese.    Past Medical History:  Diagnosis Date  . Asymptomatic varicose  veins   . Cancer (HCC)    breast   . Diverticulosis   . DM type 2 (diabetes mellitus, type 2) ([place]) 10/23/2014  . Dysuria   . GERD (gastroesophageal reflux disease)   . Hiatal hernia   . Hyperlipidemia   . Malignant neoplasm of breast (female), unspecified site   . Migraine without aura, without mention of intractable migraine without mention of status migrainosus   . Other abnormal blood chemistry   . Pain in joint, pelvic region and thigh   . Shingles    in eyes  . Stricture and stenosis of esophagus   . Trigger finger (acquired)   . Unspecified cataract   . Unspecified glaucoma(365.9)     Past Surgical History:  Procedure Laterality Date  . APPENDECTOMY  1973   Dr. Linzie Collin  . BREAST LUMPECTOMY Right 08/29/2008   Dr. Erroll Luna  . ESOPHAGEAL DILATION    . NASAL POLYP EXCISION  10/2015  . POLYPECTOMY  11/20/14   Uterine Polyp Removed    Allergies  Allergen Reactions  .  Cymbalta [Duloxetine Hcl] Other (See Comments)    sleepiness  . Gabapentin Other (See Comments)    sleepiness  . Adhesive [Tape] Dermatitis  . Betimol [Timolol Maleate]     Allergic to preservatives   . Cosopt [Dorzolamide Hcl-Timolol Mal] Other (See Comments)    Turns eye red, increases eye pressure  . Penicillins   . Prednisolone Acetate Other (See Comments)    Caused increased eye pressure  . Tamoxifen Other (See Comments)    Vaginal bleeding  . Thimerosal     Makes pt eye red   . Iodine-131 Rash    Dye from PET scan only, states has had since and did ok    Outpatient Encounter Medications as of 09/25/2020  Medication Sig  . Ascorbic Acid (VITAMIN C) 1000 MG tablet Take 1,000 mg by mouth daily.  . Calcium Citrate-Vitamin D 250-200 MG-UNIT TABS Take 500 mg by mouth daily.   . Cholecalciferol (VITAMIN D-3) 5000 units TABS Take by mouth daily.  . Cyanocobalamin 5000 MCG CAPS cyanocobalamin (vitamin B-12) 5,000 mcg capsule  Take 1 capsule every day by oral route.  . cyclobenzaprine (FLEXERIL) 10 MG tablet   . fluorometholone (FML) 0.1 % ophthalmic suspension INSTILL ONE DROP IN RIGHT EYE DAILY  . HYDROcodone-acetaminophen (NORCO) 10-325 MG tablet Take 1 tablet by mouth every 6 (six) hours as needed for severe pain.  Marland Kitchen omeprazole (PRILOSEC) 20 MG capsule Take 20 mg by mouth every other day.   Marland Kitchen omeprazole (PRILOSEC) 40 MG capsule Take 40 mg by mouth every other day.   Marland Kitchen OVER THE COUNTER MEDICATION 1 capsule 3 (three) times daily. Balance of [employer] and Fruit.  . predniSONE (DELTASONE) 5 MG tablet Take 5 mg by mouth daily.  . timolol (TIMOPTIC) 0.5 % ophthalmic solution Place 1 drop into the right eye 2 times daily.  . valACYclovir (VALTREX) 1000 MG tablet Take 1,000 mg by mouth every other day.   . zinc gluconate 50 MG tablet Take by mouth.   No facility-administered encounter medications on file as of 09/25/2020.    Review of Systems:  Review of Systems  Constitutional:  Negative for chills and fever.  HENT: Negative for congestion and sore throat.   Eyes: Negative for blurred vision.  Respiratory: Negative for cough and shortness of breath.   Cardiovascular: Positive for chest pain. Negative for palpitations and leg swelling.  Gastrointestinal: Negative for abdominal pain, blood in  stool, constipation, diarrhea and melena.       Dysphagia spell  Genitourinary: Negative for dysuria.  Musculoskeletal: Negative for falls and joint pain.  Skin: Negative for itching and rash.  Neurological: Negative for dizziness and loss of consciousness.  Psychiatric/Behavioral: Negative for depression and memory loss. The patient is nervous/anxious.     Health Maintenance  Topic Date Due  . Hepatitis C Screening  Never done  . COLONOSCOPY (Pts 45-88yr Insurance coverage will need to be confirmed)  03/26/2019  . TETANUS/TDAP  06/25/2019  . URINE MICROALBUMIN  10/09/2019  . COVID-19 Vaccine (2 - Pfizer risk 4-dose series) 09/16/2020  . INFLUENZA VACCINE  Completed  . DEXA SCAN  Completed  . PNA vac Low Risk Adult  Completed  . HPV VACCINES  Aged Out    Physical Exam: Vitals:   09/25/20 1306  BP: (!) 144/82  Pulse: 91  Temp: (!) 96.9 F (36.1 C)  TempSrc: Temporal  SpO2: 95%  Weight: 269 lb (122 kg)  Height: _0  (1.626 m)   Body mass index is 46.17 kg/m. Physical Exam Vitals reviewed.  Constitutional:      Appearance: Normal appearance. She is obese.  Eyes:     Extraocular Movements: Extraocular movements intact.     Pupils: Pupils are equal, round, and reactive to light.  Cardiovascular:     Rate and Rhythm: Normal rate and regular rhythm.     Pulses: Normal pulses.     Heart sounds: Normal heart sounds.  Pulmonary:     Effort: Pulmonary effort is normal.     Breath sounds: Normal breath sounds. No wheezing, rhonchi or rales.  Abdominal:     General: Bowel sounds are normal.  Musculoskeletal:        General: Normal range of motion.      Cervical back: Neck supple.     Right lower leg: Edema present.     Left lower leg: Edema present.  Neurological:     General: No focal deficit present.     Mental Status: She is alert and oriented to person, place, and time.     Gait: Gait abnormal.     Comments: Walks with cane; tender over right SI region and into buttocks  Psychiatric:        Mood and Affect: Mood normal.        Behavior: Behavior normal.    Labs reviewed: Basic Metabolic Panel: Recent Labs    03/13/20 1405 07/23/20 0110 09/22/20 1132  NA 138 137 139  K 4.5 3.8 4.3  CL 101 98 98  CO2 _1 GLUCOSE 116 120* 116*  BUN _2 CREATININE 0.63 0.63 0.55*  CALCIUM 10.0 9.8 9.7   Liver Function Tests: Recent Labs    11/23/19 1424 09/22/20 1132  AST 14 16  ALT 13 13  BILITOT 0.5 0.5  PROT 6.5 7.0   No results for input(s): LIPASE, AMYLASE in the last 8760 hours. No results for input(s): AMMONIA in the last 8760 hours. CBC: Recent Labs    11/23/19 1424 03/13/20 1405 07/23/20 0110 09/22/20 1132  WBC 6.7 8.6 9.9 8.0  NEUTROABS 4,241 7,164  --  6,176  HGB 12.9 12.8 13.1 13.2  HCT 37.8 38.7 42.9 39.5  MCV 98.2 93.7 97.3 89.4  PLT 258 271 294 308   Lipid Panel: Recent Labs    11/23/19 1424 09/22/20 1132  CHOL 227* 265*  HDL 63 67  LDLCALC 133* 159*  TRIG 171*  217*  CHOLHDL 3.6 4.0   Lab Results  Component Value Date   HGBA1C 6.3 (H) 09/22/2020    Procedures since last visit: No results found.  Assessment/Plan 1. Dysphagia, pharyngoesophageal phase -is for EGD after cardiac clearance  2. Chronic right-sided low back pain without sciatica -newly bothersome after hip surgery and hip pains resolved -cont norco, but ideally needs to wean off of this eventually and had been using less -working with PT on ideal assistive device  3. Prediabetes - hba1c trended up with less mobility and attempts at keto diet - Microalbumin / creatinine urine ratio  4. Polymyalgia rheumatica  ([place]) - she was wanting rheum locally instead of in [place] - Ambulatory referral to Rheumatology - Sedimentation rate -gradually weaning prednisone  5. Encounter for hepatitis C screening test for low risk patient  - Hepatitis C antibody  6. Other hyperlipidemia -continue to work on dietary improvements and increasing mobility   Labs/tests ordered:   Lab Orders     Microalbumin / creatinine urine ratio     Hepatitis C antibody     Sedimentation rate Next appt:   4 mos med mgt   Saretta Dahlem L. Marbella Markgraf, D.O. [facility] [address], [place] [address] Cell Phone (Mon-Fri 8am-5pm):  [phone] On Call:  [phone] & follow prompts after 5pm & weekends Office Phone:  [phone] Office Fax:  [phone]

## 2020-09-25 NOTE — Patient Instructions (Signed)
Please get your second covid vaccine asap

## 2020-09-25 NOTE — Therapy (Signed)
[place], [place], [address] Phone: [phone]   Fax:  [phone]  Physical Therapy Treatment  Patient Details  Name: [name] MRN: [id number] Date of Birth: [birth year] Referring Provider (PT): Dr    Gareth Morgan Date: 09/24/2020   PT End of Session - 09/25/20 0810    Visit Number 18    Number of Visits 22    Date for PT Re-Evaluation 10/25/20    Authorization Type MCR [employer] Progress note at visit 20    PT Start Time 1145    PT Stop Time 1228    PT Time Calculation (min) 43 min    Activity Tolerance Patient tolerated treatment well    Behavior During Therapy [facility] for tasks assessed/performed           Past Medical History:  Diagnosis Date  . Asymptomatic varicose veins   . Cancer (HCC)    breast   . Diverticulosis   . DM type 2 (diabetes mellitus, type 2) ([place]) 10/23/2014  . Dysuria   . GERD (gastroesophageal reflux disease)   . Hiatal hernia   . Hyperlipidemia   . Malignant neoplasm of breast (female), unspecified site   . Migraine without aura, without mention of intractable migraine without mention of status migrainosus   . Other abnormal blood chemistry   . Pain in joint, pelvic region and thigh   . Shingles    in eyes  . Stricture and stenosis of esophagus   . Trigger finger (acquired)   . Unspecified cataract   . Unspecified glaucoma(365.9)     Past Surgical History:  Procedure Laterality Date  . APPENDECTOMY  1973   Dr. Linzie Collin  . BREAST LUMPECTOMY Right 08/29/2008   Dr. Erroll Luna  . ESOPHAGEAL DILATION    . NASAL POLYP EXCISION  10/2015  . POLYPECTOMY  11/20/14   Uterine Polyp Removed    There were no vitals filed for this visit.   Subjective Assessment - 09/24/20 1153    Subjective Patient reports she woke up this morning with pain in all her joits. that has reolved. she continues to have pain in her lower back. She reports if she uses two canes it takes pressure  off her back.    Pertinent History DM, glaucoma, reports a myriad of other issues with her health, GERD    How long can you stand comfortably? unable to stand to do ADL's for more then a few minutes    How long can you walk comfortably? limited sidtances    Patient Stated Goals have less pain and walk better    Currently in Pain? Yes    Pain Score 3     Pain Location Back    Pain Orientation Right;Mid    Pain Descriptors / Indicators Aching    Pain Type Chronic pain    Pain Radiating Towards SI joint    Pain Onset More than a month ago    Pain Frequency Constant    Aggravating Factors  standing and walking    Pain Relieving Factors rest and pain meds    Effect of Pain on Daily Activities pain while walking                             [facility] Adult PT Treatment/Exercise - 09/25/20 0001      Ambulation/Gait   Gait Comments reviewed use of trecking poles for gair. she is using two  canes at home bt trecking poles would allow her to stand up straight. She was shown 2 and 3 point pattenr.      Knee/Hip Exercises: Standing   Heel Raises Limitations x20    Hip Flexion AROM;Stengthening;Both;3 sets;10 reps    Lateral Step Up Limitations 2x10 4 inch with left    Forward Step Up Limitations 2x10 6 inch    Other Standing Knee Exercises slow march x20 with min UE hand gold      Manual Therapy   Manual therapy comments trigger point release to lumbar spine, sacral area, and gluteal. Improved pain and stiffness after trigger point release                  PT Education - 09/25/20 0810    Education Details reviewed HEP and symptom management    Person(s) Educated Patient    Methods Explanation    Comprehension Verbalized understanding;Returned demonstration;Verbal cues required;Tactile cues required            PT Short Term Goals - 08/06/20 1004      PT SHORT TERM GOAL #1   Title Patientwill transfer supine to sit  and sit to supine with supervision     Baseline able to perform    Time 4    Period Weeks    Status Achieved    Target Date 07/08/20      PT SHORT TERM GOAL #2   Title Patient will dmeonstrate full active hip flexion to 90 degrees    Baseline able to do in the nu-step    Period Weeks    Status Achieved    Target Date 07/08/20      PT SHORT TERM GOAL #3   Title Patient will ambaulte 300' without increased pain with good posture without cuing with LRAD    Baseline walking with cane but still postural deficits    Time 4    Period Weeks    Status On-going      PT SHORT TERM GOAL #4   Title Therapy will review FOTO    Baseline reviewed    Period Weeks    Status Achieved             PT Long Term Goals - 08/28/20 1028      PT LONG TERM GOAL #1   Title Patient will ambualte 3000' with LRAD without pain    Baseline continues to work on endruance    Period Weeks    Status On-going      PT [place] #2   Title Patient will demonstrate a 54 % on FOTO to demonstrate improved fucntion    Baseline 39 % ability 41 % expected    Time 8    Period Weeks    Status On-going      PT LONG TERM GOAL #3   Title Patient will transfer sit to stand without use of the hands without increased pain    Baseline still needs a small boost    Time 8    Period Weeks    Status On-going      PT LONG TERM GOAL #4   Title Patient to be able to stand and ambulate without increase in pain for at least 60 minutes in order to improve QOL and assist in return to prior level of activity     Time 8    Period Weeks    Status On-going  Plan - 09/24/20 1708    Clinical Impression Statement Patient ambaulted better with trecking poles. She was able to stand more uprtight. She is using two canes at home which can worm too but it is not much different then using a walker therapy advised. She continues to have significant pain in her right SI area as she ambaulted. We will continue to work on strengthening in standing.  Sh eisnearing max potential for PT. her oal is to get off the cane compellty but at this time 2nd to her back it is questionable if tis is a realistic goal within the next few visits. She may have to continue with her exercises at home for a period of time.    Comorbidities GERD, DM, right hip replacement; bilateral LE edema    Examination-Activity Limitations Carry;Squat;Stairs;Dressing;Locomotion Level;Transfers;Bed Mobility    Examination-Participation Restrictions Meal Prep;Cleaning;Community Activity;Laundry;Yard Work    [employer] officer Evolving/Moderate complexity    Clinical Decision Making Moderate    Rehab Potential Good    PT Frequency 1x / week    PT Duration 6 weeks    PT Treatment/Interventions ADLs/Self Care Home Management;Cryotherapy;Electrical Stimulation;Iontophoresis 45m/ml Dexamethasone;Moist Heat;Therapeutic activities;Functional mobility training;Gait training;Therapeutic exercise;Balance training;Neuromuscular re-education;Patient/family education;Dry needling;Ultrasound;Manual techniques;Taping    PT Next Visit Plan continue to progress steps and standing exercsies    PT Home Exercise Plan has a good exercise program at home.    Consulted and Agree with Plan of Care Patient           Patient will benefit from skilled therapeutic intervention in order to improve the following deficits and impairments:  Abnormal gait,Cardiopulmonary status limiting activity,Decreased activity tolerance,Decreased endurance,Decreased range of motion,Decreased strength,Difficulty walking,Increased edema,Decreased balance,Decreased mobility,Impaired flexibility,Obesity,Increased muscle spasms,Pain,Improper body mechanics,Decreased knowledge of use of DME  Visit Diagnosis: Pain in left hip  Stiffness of left hip, not elsewhere classified  Difficulty in walking, not elsewhere classified  Muscle weakness (generalized)  Pain in right hip     Problem List Patient  Active Problem List   Diagnosis Date Noted  . Uveitic glaucoma of right eye, moderate stage 08/08/2020  . Pain and swelling of right lower leg 07/21/2020  . Aftercare following joint replacement surgery 01/23/2020  . Neurogenic claudication due to lumbar spinal stenosis 05/28/2019  . Right groin pain 05/28/2019  . Polymyalgia rheumatica ([place] 10/09/2018  . BMI 40.0-44.9, adult ([place] 10/09/2018  . Foraminal stenosis of lumbar region 05/11/2018  . 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 On prednisone therapy 02/14/2018  . Herpes zoster 10/16/2017  . Chronic pain of both shoulders 08/10/2017  . Prediabetes 05/26/2017  . Primary osteoarthritis of both hips 03/15/2017  . Trochanteric bursitis of left hip 03/15/2017  . Recurrent epistaxis 12/09/2016  . Dysuria 11/03/2016  . Insomnia 11/03/2016  . Glaucoma 05/05/2016  . LPRD (laryngopharyngeal reflux disease) 01/26/2016  . Erosive esophagitis 11/27/2015  . Schatzki's ring of distal esophagus 11/27/2015  . Chronic sinusitis 10/16/2015  . Deviated nasal septum 10/16/2015  . Nasal polyp 10/16/2015  . Osteoporosis 08/15/2015  . Dysphagia, pharyngoesophageal phase 04/01/2015  . Endometrial polyp 10/24/2014  . Fatigue 07/09/2014  . Hyperlipidemia   . Breast cancer, right ([place] 02/01/2014  . Malignant melanoma of arm ([place] 11/23/2013  . Nasal septum ulceration 10/16/2013  . Seborrheic keratosis 10/16/2013  . Vitamin D deficiency 02/08/2013  . Sciatica of right side 01/24/2013  . Esophageal ulcer 01/24/2013  . Esophageal stenosis 01/24/2013  . Choroidal nevus of right eye 10/27/2012  . Impacted  cerumen of right ear 10/11/2012  . Frequency 01/19/2012  . Gastroesophageal reflux disease 09/01/2011  . 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 Horseshoe retinal tear of right eye 08/03/2011  . 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 H/O cataract extraction 04/30/2011  . Borderline glaucoma steroid  responder 04/30/2011  . Class 2 obesity due to excess calories without serious comorbidity with body mass index (BMI) of 39.0 to 39.9 in adult 10/16/2008  . Breast cancer of lower-outer quadrant of right female breast ([place]) 08/29/2008    Carney Living PT DPT  09/25/2020, 8:16 AM  [facility] [address] [place], [place], [address] Phone: [phone]   Fax:  [phone]  Name: [name] MRN: [id number] Date of Birth: 19-Oct-1945

## 2020-09-26 LAB — SEDIMENTATION RATE: Sed Rate: 38 mm/h — ABNORMAL HIGH (ref 0–30)

## 2020-09-26 LAB — MICROALBUMIN / CREATININE URINE RATIO
Creatinine, Urine: 104 mg/dL (ref 20–275)
Microalb Creat Ratio: 9 mcg/mg creat (ref <30)
Microalb, Ur: 0.9 mg/dL

## 2020-09-26 LAB — HEPATITIS C ANTIBODY
Hepatitis C Ab: NONREACTIVE
SIGNAL TO CUT-OFF: 0 (ref <1.00)

## 2020-09-28 NOTE — Progress Notes (Signed)
ESR is back up a little bit right now.  This could be due to other inflammation in her body like her spine if her hips and shoulders are ok. No significant protein in the urine Hepatitis C screen was negative.

## 2020-09-29 ENCOUNTER — Encounter: Payer: Self-pay | Admitting: Interventional Cardiology

## 2020-09-29 ENCOUNTER — Ambulatory Visit: Payer: Medicare Other | Admitting: Cardiology

## 2020-09-29 ENCOUNTER — Other Ambulatory Visit: Payer: Self-pay

## 2020-09-29 ENCOUNTER — Ambulatory Visit (INDEPENDENT_AMBULATORY_CARE_PROVIDER_SITE_OTHER): Payer: Medicare Other | Admitting: Interventional Cardiology

## 2020-09-29 VITALS — BP 144/80 | HR 92 | Ht 64.0 in | Wt 267.6 lb

## 2020-09-29 DIAGNOSIS — R030 Elevated blood-pressure reading, without diagnosis of hypertension: Secondary | ICD-10-CM

## 2020-09-29 DIAGNOSIS — Z01810 Encounter for preprocedural cardiovascular examination: Secondary | ICD-10-CM

## 2020-09-29 DIAGNOSIS — R600 Localized edema: Secondary | ICD-10-CM | POA: Diagnosis not present

## 2020-09-29 DIAGNOSIS — R072 Precordial pain: Secondary | ICD-10-CM | POA: Diagnosis not present

## 2020-09-29 NOTE — Progress Notes (Signed)
Cardiology Office Note   Date:  09/29/2020   ID:  [name] 1945/08/27, MRN [id number]  PCP:  Gayland Curry, DO    No chief complaint on file.  Chest pain  Wt Readings from Last 3 Encounters:  09/29/20 267 lb 9.6 oz (121.4 kg)  09/25/20 269 lb (122 kg)  07/21/20 260 lb (117.9 kg)       History of Present Illness: [name] is a 75 y.o. female who is being seen today for the evaluation of chest pain at the request of Ronnette Juniper, MD.  Had an episode of chest pain after eating.  It lasted 9 hours and then she called EMS.  Troponins were negative when she went to the ER.  Her BP was very high.  Chest CT was negative for dissection.: "The thoracic aorta is unremarkable without aneurysm or dissection. Minimal atherosclerosis.  The heart is unremarkable without pericardial effusion. While not optimized for the opacification of the pulmonary vasculature, there is sufficient contrast enhancement to exclude pulmonary emboli."  SHe is being considered for EGD with stretching.  She needs cardiac clearance prior to EGD.    She had general anesthesia twice in 2021 for hip surgery.  SHe has stage 4 melanoma.    No further chest pain since December. No sx in her chest when she walks.  She is deconditioned as she is still increasing stamina  Form her hip surgery.  SHe gained 40 lbs in 2021.   No family h/o CAD. More cancer in the family.   Never smoked.  Past Medical History:  Diagnosis Date  . Asymptomatic varicose veins   . Cancer (HCC)    breast   . Diverticulosis   . DM type 2 (diabetes mellitus, type 2) ([place]) 10/23/2014  . Dysuria   . GERD (gastroesophageal reflux disease)   . Hiatal hernia   . Hyperlipidemia   . Malignant neoplasm of breast (female), unspecified site   . Migraine without aura, without mention of intractable migraine without mention of status migrainosus   . Other abnormal blood chemistry   . Pain in joint, pelvic region and thigh   .  Shingles    in eyes  . Stricture and stenosis of esophagus   . Trigger finger (acquired)   . Unspecified cataract   . Unspecified glaucoma(365.9)     Past Surgical History:  Procedure Laterality Date  . APPENDECTOMY  1973   Dr. Linzie Collin  . BREAST LUMPECTOMY Right 08/29/2008   Dr. Erroll Luna  . ESOPHAGEAL DILATION    . NASAL POLYP EXCISION  10/2015  . POLYPECTOMY  11/20/14   Uterine Polyp Removed     Current Outpatient Medications  Medication Sig Dispense Refill  . Ascorbic Acid (VITAMIN C) 1000 MG tablet Take 1,000 mg by mouth daily.    . Calcium Citrate-Vitamin D 250-200 MG-UNIT TABS Take 500 mg by mouth daily.     . Cholecalciferol (VITAMIN D-3) 5000 units TABS Take by mouth daily.    . Cyanocobalamin 5000 MCG CAPS cyanocobalamin (vitamin B-12) 5,000 mcg capsule  Take 1 capsule every day by oral route.    . fluorometholone (FML) 0.1 % ophthalmic suspension INSTILL ONE DROP IN RIGHT EYE DAILY    . HYDROcodone-acetaminophen (NORCO) 10-325 MG tablet Take 1 tablet by mouth every 6 (six) hours as needed for severe pain. 120 tablet 0  . omeprazole (PRILOSEC) 20 MG capsule Take 20 mg by mouth every other day.     Marland Kitchen  omeprazole (PRILOSEC) 40 MG capsule Take 40 mg by mouth every other day.     Marland Kitchen OVER THE COUNTER MEDICATION 1 capsule 3 (three) times daily. Balance of [employer] and Fruit.    . predniSONE (DELTASONE) 5 MG tablet Take 5 mg by mouth daily.    . timolol (TIMOPTIC) 0.5 % ophthalmic solution Place 1 drop into the right eye 2 times daily.    . valACYclovir (VALTREX) 1000 MG tablet Take 1,000 mg by mouth every other day.     . zinc gluconate 50 MG tablet Take by mouth.     No current facility-administered medications for this visit.    Allergies:   Cymbalta [duloxetine hcl], Gabapentin, Adhesive [tape], Betimol [timolol maleate], Cosopt [dorzolamide hcl-timolol mal], Penicillins, Prednisolone acetate, Tamoxifen, Thimerosal, and Iodine-131    Social History:  The  patient  reports that she has never smoked. She has never used smokeless tobacco. She reports that she does not drink alcohol and does not use drugs.   Family History:  The patient's family history includes Alzheimer's disease in her mother; COPD in her father; Emphysema in her father; Hyperlipidemia in her mother; Hypertension in her mother.    ROS:  Please see the history of present illness.   Otherwise, review of systems are positive for leg swelling.   All other systems are reviewed and negative.    PHYSICAL EXAM: VS:  BP (!) 144/80   Pulse 92   Ht 5\' 4"  (1.626 m)   Wt 267 lb 9.6 oz (121.4 kg)   SpO2 97%   BMI 45.93 kg/m  , BMI Body mass index is 45.93 kg/m. GEN: Well nourished, well developed, in no acute distress  HEENT: normal  Neck: no JVD, carotid bruits, or masses Cardiac: RRR; no murmurs, rubs, or gallops,bilateral ankle edema  Respiratory:  clear to auscultation bilaterally, normal work of breathing GI: soft, nontender, nondistended, + BS, obse MS: right ankle deformity Skin: warm and dry, no rash Neuro:  Strength and sensation are intact Psych: euthymic mood, full affect   EKG:   The ekg ordered 07/23/20 demonstrates sinus tach, LBBB  ECG today showed NSR, QRS has narrowed   Recent Labs: 09/22/2020: ALT 13; BUN 12; Creat 0.55; Hemoglobin 13.2; Platelets 308; Potassium 4.3; Sodium 139   Lipid Panel    Component Value Date/Time   CHOL 265 (H) 09/22/2020 1132   CHOL 229 (H) 03/27/2015 1041   TRIG 217 (H) 09/22/2020 1132   HDL 67 09/22/2020 1132   HDL 64 03/27/2015 1041   CHOLHDL 4.0 09/22/2020 1132   VLDL 30 10/29/2016 1049   LDLCALC 159 (H) 09/22/2020 1132     Other studies Reviewed: Additional studies/ records that were reviewed today with results demonstrating: ER records reviewed.   ASSESSMENT AND PLAN:  1. Chest pain/peop cardiac exam: Episode from December 2021 reviewed.  9 hours of pain and negative troponins.  ECG today shows normalized QRS  and continued normal sinus rhythm.  Minimal atherosclerosis noted on CT scan of her chest.  No symptoms since that day in December.  She has been more active as she recovers from bilateral hip surgery.  Given that it seems like that was a one-time episode, no further cardiac testing needed before EGD. 2. Elevated blood pressure: Very high at the episode in December.  Mildly increased today.  Hopefully, as she gets more active, blood pressure will come down.  She has also gained significant weight since her surgery.  Weight loss will help.  3. Lower extremity edema: Elevate legs.  She can try compression stockings.  I think this is likely from venous insufficiency.   Current medicines are reviewed at length with the patient today.  The patient concerns regarding her medicines were addressed.  The following changes have been made:  No change  Labs/ tests ordered today include:  No orders of the defined types were placed in this encounter.   Recommend 150 minutes/week of aerobic exercise Low fat, low carb, high fiber diet recommended  Disposition:   FU as needed   Signed, Larae Grooms, MD  09/29/2020 2:33 PM    [facility] [address], [place], [place]  [address] Phone: [phone]; Fax: [phone]

## 2020-09-29 NOTE — Patient Instructions (Signed)
Medication Instructions:  Your physician recommends that you continue on your current medications as directed. Please refer to the Current Medication list given to you today.  *If you need a refill on your cardiac medications before your next appointment, please call your pharmacy*   Lab Work: none If you have labs (blood work) drawn today and your tests are completely normal, you will receive your results only by: MyChart Message (if you have MyChart) OR A paper copy in the mail If you have any lab test that is abnormal or we need to change your treatment, we will call you to review the results.   Testing/Procedures: none   Follow-Up: At [facility], you and your health needs are our priority.  As part of our continuing mission to provide you with exceptional heart care, we have created designated Provider Care Teams.  These Care Teams include your primary Cardiologist (physician) and Advanced Practice Providers (APPs -  Physician Assistants and Nurse Practitioners) who all work together to provide you with the care you need, when you need it.  We recommend signing up for the patient portal called "MyChart".  Sign up information is provided on this After Visit Summary.  MyChart is used to connect with patients for Virtual Visits (Telemedicine).  Patients are able to view lab/test results, encounter notes, upcoming appointments, etc.  Non-urgent messages can be sent to your provider as well.   To learn more about what you can do with MyChart, go to [email or web address].    Your next appointment:   As needed  The format for your next appointment:   In Person  Provider:   You may see Jayadeep Varanasi, MD or one of the following Advanced Practice Providers on your designated Care Team:   Dayna Dunn, PA-C Michele Lenze, PA-C   Other Instructions   

## 2020-10-01 ENCOUNTER — Other Ambulatory Visit: Payer: Self-pay

## 2020-10-01 ENCOUNTER — Encounter: Payer: Self-pay | Admitting: Physical Therapy

## 2020-10-01 ENCOUNTER — Ambulatory Visit: Payer: Medicare Other | Admitting: Physical Therapy

## 2020-10-01 DIAGNOSIS — M25552 Pain in left hip: Secondary | ICD-10-CM

## 2020-10-01 DIAGNOSIS — M6281 Muscle weakness (generalized): Secondary | ICD-10-CM | POA: Diagnosis not present

## 2020-10-01 DIAGNOSIS — M25551 Pain in right hip: Secondary | ICD-10-CM

## 2020-10-01 DIAGNOSIS — R262 Difficulty in walking, not elsewhere classified: Secondary | ICD-10-CM

## 2020-10-01 DIAGNOSIS — M25652 Stiffness of left hip, not elsewhere classified: Secondary | ICD-10-CM | POA: Diagnosis not present

## 2020-10-02 ENCOUNTER — Encounter: Payer: Self-pay | Admitting: Physical Therapy

## 2020-10-02 NOTE — Therapy (Signed)
[place], [place], [address] Phone: [phone]   Fax:  [phone]  Physical Therapy Treatment  Patient Details  Name: [name] MRN: [id number] Date of Birth: Dec 04, 1945 Referring Provider (PT): Dr    Gareth Morgan Date: 10/01/2020   PT End of Session - 10/01/20 1608    Visit Number 19    Number of Visits 22    Date for PT Re-Evaluation 10/25/20    Authorization Type MCR [employer] Progress note at visit 20    PT Start Time 1545    PT Stop Time 1625    PT Time Calculation (min) 40 min    Equipment Utilized During Treatment Right knee immobilizer    Activity Tolerance Patient tolerated treatment well    Behavior During Therapy [facility] for tasks assessed/performed           Past Medical History:  Diagnosis Date  . Asymptomatic varicose veins   . Cancer (HCC)    breast   . Diverticulosis   . DM type 2 (diabetes mellitus, type 2) ([place]) 10/23/2014  . Dysuria   . GERD (gastroesophageal reflux disease)   . Hiatal hernia   . Hyperlipidemia   . Malignant neoplasm of breast (female), unspecified site   . Migraine without aura, without mention of intractable migraine without mention of status migrainosus   . Other abnormal blood chemistry   . Pain in joint, pelvic region and thigh   . Shingles    in eyes  . Stricture and stenosis of esophagus   . Trigger finger (acquired)   . Unspecified cataract   . Unspecified glaucoma(365.9)     Past Surgical History:  Procedure Laterality Date  . APPENDECTOMY  1973   Dr. Linzie Collin  . BREAST LUMPECTOMY Right 08/29/2008   Dr. Erroll Luna  . ESOPHAGEAL DILATION    . NASAL POLYP EXCISION  10/2015  . POLYPECTOMY  11/20/14   Uterine Polyp Removed    There were no vitals filed for this visit.   Subjective Assessment - 10/01/20 1555    Subjective Patient did a lot of standing making a soup yesterday. overall she reports it has been doing well. She has had some pain  standing. She has tried to go without the cane some at home.    Pertinent History DM, glaucoma, reports a myriad of other issues with her health, GERD    How long can you stand comfortably? unable to stand to do ADL's for more then a few minutes    How long can you walk comfortably? limited sidtances    Patient Stated Goals have less pain and walk better    Currently in Pain? Yes    Pain Score 3     Pain Location Back    Pain Orientation Right    Pain Descriptors / Indicators Aching    Pain Type Chronic pain    Pain Onset More than a month ago    Pain Frequency Constant    Aggravating Factors  standing and walking    Pain Relieving Factors rest and pain meds    Effect of Pain on Daily Activities pain                             [facility] Adult PT Treatment/Exercise - 10/02/20 0001      Lumbar Exercises: Aerobic   Nustep 8 min L3      Lumbar Exercises: Seated  LAQ on Chair Limitations x20 2lb weight    Other Seated Lumbar Exercises seated clamshells 3x10 green  TB; bilateral ; hamstring curl red x20;      Knee/Hip Exercises: Standing   Heel Raises Limitations x20    Hip Flexion AROM;Stengthening;Both;3 sets;10 reps    Lateral Step Up Limitations 2x10 4 inch with left    Forward Step Up Limitations 2x10 6 inch    Other Standing Knee Exercises slow march x20 with min UE hand gold      Manual Therapy   Manual therapy comments trigger point release to lumbar spine, sacral area, and gluteal. Improved pain and stiffness after trigger point release                  PT Education - 10/01/20 1607    Education Details reviewed standing exercises    Person(s) Educated Patient    Methods Explanation;Demonstration;Tactile cues;Verbal cues    Comprehension Verbalized understanding;Returned demonstration;Verbal cues required;Tactile cues required            PT Short Term Goals - 08/06/20 1004      PT SHORT TERM GOAL #1   Title Patientwill transfer supine to  sit  and sit to supine with supervision    Baseline able to perform    Time 4    Period Weeks    Status Achieved    Target Date 07/08/20      PT SHORT TERM GOAL #2   Title Patient will dmeonstrate full active hip flexion to 90 degrees    Baseline able to do in the nu-step    Period Weeks    Status Achieved    Target Date 07/08/20      PT SHORT TERM GOAL #3   Title Patient will ambaulte 300' without increased pain with good posture without cuing with LRAD    Baseline walking with cane but still postural deficits    Time 4    Period Weeks    Status On-going      PT SHORT TERM GOAL #4   Title Therapy will review FOTO    Baseline reviewed    Period Weeks    Status Achieved             PT Long Term Goals - 08/28/20 1028      PT LONG TERM GOAL #1   Title Patient will ambualte 3000' with LRAD without pain    Baseline continues to work on endruance    Period Weeks    Status On-going      PT [place] #2   Title Patient will demonstrate a 54 % on FOTO to demonstrate improved fucntion    Baseline 39 % ability 41 % expected    Time 8    Period Weeks    Status On-going      PT LONG TERM GOAL #3   Title Patient will transfer sit to stand without use of the hands without increased pain    Baseline still needs a small boost    Time 8    Period Weeks    Status On-going      PT LONG TERM GOAL #4   Title Patient to be able to stand and ambulate without increase in pain for at least 60 minutes in order to improve QOL and assist in return to prior level of activity     Time 8    Period Weeks    Status On-going  Plan - 10/01/20 1609    Clinical Impression Statement Patient tolerated standing exercises well. She had no significant increase in pain. She continues to have a spot in her scarum that is painful but that is unchange over the past few visits. We continues to work on standing exercises and progressing steps.    Comorbidities GERD, DM,  right hip replacement; bilateral LE edema    Examination-Activity Limitations Carry;Squat;Stairs;Dressing;Locomotion Level;Transfers;Bed Mobility    Examination-Participation Restrictions Meal Prep;Cleaning;Community Activity;Laundry;Yard Work    [employer] Evolving/Moderate complexity    Clinical Decision Making Moderate    Rehab Potential Good    PT Frequency 1x / week    PT Duration 6 weeks    PT Treatment/Interventions ADLs/Self Care Home Management;Cryotherapy;Electrical Stimulation;Iontophoresis 4mg /ml Dexamethasone;Moist Heat;Therapeutic activities;Functional mobility training;Gait training;Therapeutic exercise;Balance training;Neuromuscular re-education;Patient/family education;Dry needling;Ultrasound;Manual techniques;Taping    PT Next Visit Plan continue to progress steps and standing exercsies    PT Home Exercise Plan has a good exercise program at home.    Consulted and Agree with Plan of Care Patient           Patient will benefit from skilled therapeutic intervention in order to improve the following deficits and impairments:  Abnormal gait,Cardiopulmonary status limiting activity,Decreased activity tolerance,Decreased endurance,Decreased range of motion,Decreased strength,Difficulty walking,Increased edema,Decreased balance,Decreased mobility,Impaired flexibility,Obesity,Increased muscle spasms,Pain,Improper body mechanics,Decreased knowledge of use of DME  Visit Diagnosis: Pain in left hip  Stiffness of left hip, not elsewhere classified  Difficulty in walking, not elsewhere classified  Muscle weakness (generalized)  Pain in right hip     Problem List Patient Active Problem List   Diagnosis Date Noted  . Uveitic glaucoma of right eye, moderate stage 08/08/2020  . Pain and swelling of right lower leg 07/21/2020  . Aftercare following joint replacement surgery 01/23/2020  . Neurogenic claudication due to lumbar spinal stenosis 05/28/2019   . Right groin pain 05/28/2019  . Polymyalgia rheumatica ([place]) 10/09/2018  . BMI 40.0-44.9, adult ([place]) 10/09/2018  . Foraminal stenosis of lumbar region 05/11/2018  . 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 On prednisone therapy 02/14/2018  . Herpes zoster 10/16/2017  . Chronic pain of both shoulders 08/10/2017  . Prediabetes 05/26/2017  . Primary osteoarthritis of both hips 03/15/2017  . Trochanteric bursitis of left hip 03/15/2017  . Recurrent epistaxis 12/09/2016  . Dysuria 11/03/2016  . Insomnia 11/03/2016  . Glaucoma 05/05/2016  . LPRD (laryngopharyngeal reflux disease) 01/26/2016  . Erosive esophagitis 11/27/2015  . Schatzki's ring of distal esophagus 11/27/2015  . Chronic sinusitis 10/16/2015  . Deviated nasal septum 10/16/2015  . Nasal polyp 10/16/2015  . Osteoporosis 08/15/2015  . Dysphagia, pharyngoesophageal phase 04/01/2015  . Endometrial polyp 10/24/2014  . Fatigue 07/09/2014  . Hyperlipidemia   . Breast cancer, right ([place]) 02/01/2014  . Malignant melanoma of arm ([place]) 11/23/2013  . Nasal septum ulceration 10/16/2013  . Seborrheic keratosis 10/16/2013  . Vitamin D deficiency 02/08/2013  . Sciatica of right side 01/24/2013  . Esophageal ulcer 01/24/2013  . Esophageal stenosis 01/24/2013  . Choroidal nevus of right eye 10/27/2012  . Impacted cerumen of right ear 10/11/2012  . Frequency 01/19/2012  . Gastroesophageal reflux disease 09/01/2011  . 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 Horseshoe retinal tear of right eye 08/03/2011  . 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 H/O cataract extraction 04/30/2011  . Borderline glaucoma steroid responder 04/30/2011  . Class 2 obesity due to excess calories without serious comorbidity with body mass index (BMI)  of 39.0 to 39.9 in adult 10/16/2008  . Breast cancer of lower-outer quadrant of right female breast ([place]) 08/29/2008    Carney Living PT DPT  10/02/2020, 12:26  PM  [facility] [address] [place], [place], [address] Phone: [phone]   Fax:  [phone]  Name: [name] MRN: [id number] Date of Birth: 1946-04-04

## 2020-10-08 ENCOUNTER — Ambulatory Visit: Payer: Medicare Other | Admitting: Physical Therapy

## 2020-10-15 ENCOUNTER — Ambulatory Visit: Payer: Medicare Other | Admitting: Physical Therapy

## 2020-10-15 ENCOUNTER — Other Ambulatory Visit: Payer: Self-pay

## 2020-10-15 DIAGNOSIS — M25551 Pain in right hip: Secondary | ICD-10-CM | POA: Diagnosis not present

## 2020-10-15 DIAGNOSIS — R262 Difficulty in walking, not elsewhere classified: Secondary | ICD-10-CM

## 2020-10-15 DIAGNOSIS — M25652 Stiffness of left hip, not elsewhere classified: Secondary | ICD-10-CM | POA: Diagnosis not present

## 2020-10-15 DIAGNOSIS — M25552 Pain in left hip: Secondary | ICD-10-CM | POA: Diagnosis not present

## 2020-10-15 DIAGNOSIS — M6281 Muscle weakness (generalized): Secondary | ICD-10-CM

## 2020-10-16 ENCOUNTER — Encounter: Payer: Self-pay | Admitting: Physical Therapy

## 2020-10-16 NOTE — Therapy (Signed)
[place], [place], [address] Phone: [phone]   Fax:  [phone]  Physical Therapy Treatment  Patient Details  Name: [name] MRN: [id number] Date of Birth: 06-26-1946 Referring Provider (PT): Dr    Gareth Morgan Date: 10/15/2020   Progress Note Reporting Period 07/30/2020 to 10/16/2020  See note below for Objective Data and Assessment of Progress/Goals.        PT End of Session - 10/15/20 1155    Visit Number 20    Number of Visits 22    Date for PT Re-Evaluation 10/25/20    Authorization Type MCR BCBS Progress note at visit 57    PT Start Time 1147    PT Stop Time 1230    PT Time Calculation (min) 43 min    Activity Tolerance Patient tolerated treatment well    Behavior During Therapy WFL for tasks assessed/performed           Past Medical History:  Diagnosis Date   Asymptomatic varicose veins    Cancer ([facility])    breast    Diverticulosis    DM type 2 (diabetes mellitus, type 2) ([facility]) 10/23/2014   Dysuria    GERD (gastroesophageal reflux disease)    Hiatal hernia    Hyperlipidemia    Malignant neoplasm of breast (female), unspecified site    Migraine without aura, without mention of intractable migraine without mention of status migrainosus    Other abnormal blood chemistry    Pain in joint, pelvic region and thigh    Shingles    in eyes   Stricture and stenosis of esophagus    Trigger finger (acquired)    Unspecified cataract    Unspecified glaucoma(365.9)     Past Surgical History:  Procedure Laterality Date   APPENDECTOMY  1973   Dr. Linzie Collin   BREAST LUMPECTOMY Right 08/29/2008   Dr. Erroll Luna   ESOPHAGEAL DILATION     NASAL POLYP EXCISION  10/2015   POLYPECTOMY  11/20/14   Uterine Polyp Removed    There were no vitals filed for this visit.   Subjective Assessment - 10/16/20 1104    Subjective Patient missed last week becuase of the rain.  It is raining again today and she is very stiff. She has been working on her exercises at home.    Pertinent History DM, glaucoma, reports a myriad of other issues with her health, GERD    How long can you stand comfortably? unable to stand to do ADL's for more then a few minutes    How long can you walk comfortably? limited sidtances    Patient Stated Goals have less pain and walk better    Currently in Pain? Yes    Pain Score 4     Pain Location Back    Pain Orientation Right    Pain Descriptors / Indicators Aching    Pain Type Chronic pain    Pain Onset More than a month ago    Pain Frequency Constant    Aggravating Factors  standing and walking    Pain Relieving Factors rest and pain meds    Effect of Pain on Daily Activities pain    Multiple Pain Sites No                             [facility] Adult PT Treatment/Exercise - 10/16/20 0001      Lumbar Exercises: Aerobic  Nustep 8 min L3      Knee/Hip Exercises: Standing   Heel Raises Limitations x20    Hip Flexion AROM;Stengthening;Both;3 sets;10 reps    Lateral Step Up Limitations 2x10 46inch with left    Forward Step Up Limitations 2x10 6 inch    Other Standing Knee Exercises slow march x20 with min UE hand gold      Knee/Hip Exercises: Seated   Other Seated Knee/Hip Exercises seated clamshell green 3x10    Other Seated Knee/Hip Exercises seated ball roll and stretch to the left 5 x 5 sec hold    Marching AROM;Both;1 set;5 sets;Limitations    Hamstring Limitations 3x10 green bilateral    Abd/Adduction Limitations hip abdcution yellow 3x10 red                  PT Education - 10/16/20 1106    Education Details gait ttraining with the cane    Person(s) Educated Patient    Methods Explanation;Demonstration;Tactile cues;Verbal cues    Comprehension Verbalized understanding;Returned demonstration;Verbal cues required;Tactile cues required            PT Short Term Goals - 10/16/20 1237      PT  SHORT TERM GOAL #1   Title Patientwill transfer supine to sit  and sit to supine with supervision    Baseline able to perform    Time 4    Period Weeks    Target Date 07/08/20      PT SHORT TERM GOAL #2   Title Patient will dmeonstrate full active hip flexion to 90 degrees    Baseline able to do in the nu-step    Time 4    Period Weeks    Status Achieved    Target Date 07/08/20      PT SHORT TERM GOAL #3   Title Patient will ambaulte 300' without increased pain with good posture without cuing with LRAD    Baseline cane do with 2 canes without pain    Time 4    Period Weeks    Status Achieved    Target Date 07/08/20      PT SHORT TERM GOAL #4   Title Therapy will review FOTO    Time 4    Period Weeks    Status Achieved    Target Date 11/15/17             PT Long Term Goals - 10/16/20 1238      PT LONG TERM GOAL #1   Title Patient will ambualte 3000' with LRAD without pain    Baseline has pain with standing and walking    Time 8    Period Weeks    Status On-going      PT LONG TERM GOAL #2   Title Patient will demonstrate a 54 % on FOTO to demonstrate improved fucntion    Baseline 44% ability    Time 8    Period Weeks    Status On-going      PT LONG TERM GOAL #3   Title Patient will transfer sit to stand without use of the hands without increased pain    Baseline still needs a small boost    Time 8    Period Weeks    Status On-going    Target Date 12/11/20      PT LONG TERM GOAL #4   Title Patient to be able to stand and ambulate without increase in pain for at least 60 minutes in order to  improve QOL and assist in return to prior level of activity     Baseline can stsand for about a half hour    Time 8    Period Weeks    Status On-going    Target Date 12/11/20                 Plan - 10/16/20 1106    Clinical Impression Statement Patient perfromed progress note on the patient. She has better active motion on herleft leg then her right. Her  FOTO score has not improved in 5 visits. Her main prolem at this time is pain in her back. She has all her exercises at home. We have been working on stair training in therapy. At this timeher back is better when she uses the walker and the cane. The back has not improved depsite strengthening and manual therapy since she went to the cane. She was advised she will likley need to use the walker or tow canes for the time being or go back to the MD for further follow up regarding the back. The patient has 1 more visit left. We will likely D/C to HEP at that time.    Personal Factors and Comorbidities Comorbidity 3+    Comorbidities GERD, DM, right hip replacement; bilateral LE edema    Stability/Clinical Decision Making Evolving/Moderate complexity    Clinical Decision Making Moderate    Rehab Potential Good    PT Frequency 1x / week    PT Duration 6 weeks    PT Treatment/Interventions ADLs/Self Care Home Management;Cryotherapy;Electrical Stimulation;Iontophoresis 62m/ml Dexamethasone;Moist Heat;Therapeutic activities;Functional mobility training;Gait training;Therapeutic exercise;Balance training;Neuromuscular re-education;Patient/family education;Dry needling;Ultrasound;Manual techniques;Taping    PT Next Visit Plan continue to progress steps and standing exercsies    PT Home Exercise Plan has a good exercise program at home.    Consulted and Agree with Plan of Care Patient           Patient will benefit from skilled therapeutic intervention in order to improve the following deficits and impairments:  Abnormal gait,Cardiopulmonary status limiting activity,Decreased activity tolerance,Decreased endurance,Decreased range of motion,Decreased strength,Difficulty walking,Increased edema,Decreased balance,Decreased mobility,Impaired flexibility,Obesity,Increased muscle spasms,Pain,Improper body mechanics,Decreased knowledge of use of DME  Visit Diagnosis: Pain in left hip  Stiffness of left hip, not  elsewhere classified  Difficulty in walking, not elsewhere classified  Muscle weakness (generalized)  Pain in right hip     Problem List Patient Active Problem List   Diagnosis Date Noted   Uveitic glaucoma of right eye, moderate stage 08/08/2020   Pain and swelling of right lower leg 07/21/2020   Aftercare following joint replacement surgery 01/23/2020   Neurogenic claudication due to lumbar spinal stenosis 05/28/2019   Right groin pain 05/28/2019   [place]umatica [place] 10/09/2018   BMI 40.0-44.9, adult ([place] 10/09/2018   Foraminal stenosis of lumbar region 05/11/2018   Spinal stenosis of lumbar region at multiple levels 05/11/2018   On prednisone therapy 02/14/2018   Herpes zoster 10/16/2017   Chronic pain of both shoulders 08/10/2017   Prediabetes 05/26/2017   Primary osteoarthritis of both hips 03/15/2017   Trochanteric bursitis of left hip 03/15/2017   Recurrent epistaxis 12/09/2016   Dysuria 11/03/2016   Insomnia 11/03/2016   Glaucoma 05/05/2016   LPRD (laryngopharyngeal reflux disease) 01/26/2016   Erosive esophagitis 11/27/2015   Schatzki's ring of distal esophagus 11/27/2015   Chronic sinusitis 10/16/2015   Deviated nasal septum 10/16/2015   Nasal polyp 10/16/2015   Osteoporosis 08/15/2015   Dysphagia, pharyngoesophageal phase 04/01/2015  Endometrial polyp 10/24/2014   Fatigue 07/09/2014   Hyperlipidemia    Breast cancer, right ([place]) 02/01/2014   Malignant melanoma of arm ([place]) 11/23/2013   Nasal septum ulceration 10/16/2013   Seborrheic keratosis 10/16/2013   Vitamin D deficiency 02/08/2013   Sciatica of right side 01/24/2013   Esophageal ulcer 01/24/2013   Esophageal stenosis 01/24/2013   Choroidal nevus of right eye 10/27/2012   Impacted cerumen of right ear 10/11/2012   Frequency 01/19/2012   Gastroesophageal reflux disease 09/01/2011   Horseshoe tear of retina without detachment 08/03/2011    Horseshoe retinal tear of right eye 08/03/2011   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 [place], [place], [address] Phone: [phone]   Fax:  [phone]  Name: [name] MRN: [id number] Date of Birth: [birth year]

## 2020-10-22 ENCOUNTER — Other Ambulatory Visit: Payer: Self-pay | Admitting: *Deleted

## 2020-10-22 ENCOUNTER — Ambulatory Visit: Payer: Medicare Other | Admitting: Physical Therapy

## 2020-10-22 ENCOUNTER — Encounter: Payer: Self-pay | Admitting: Physical Therapy

## 2020-10-22 ENCOUNTER — Other Ambulatory Visit: Payer: Self-pay

## 2020-10-22 DIAGNOSIS — M25551 Pain in right hip: Secondary | ICD-10-CM

## 2020-10-22 DIAGNOSIS — M6281 Muscle weakness (generalized): Secondary | ICD-10-CM

## 2020-10-22 DIAGNOSIS — M25652 Stiffness of left hip, not elsewhere classified: Secondary | ICD-10-CM | POA: Diagnosis not present

## 2020-10-22 DIAGNOSIS — R262 Difficulty in walking, not elsewhere classified: Secondary | ICD-10-CM | POA: Diagnosis not present

## 2020-10-22 DIAGNOSIS — M25552 Pain in left hip: Secondary | ICD-10-CM

## 2020-10-22 DIAGNOSIS — M5431 Sciatica, right side: Secondary | ICD-10-CM

## 2020-10-22 NOTE — Telephone Encounter (Signed)
Patient requested refill Epic LR: 09/22/2020 Contract on File Pended Rx and sent to Dr. Sabra Heck for approval.

## 2020-10-23 MED ORDER — HYDROCODONE-ACETAMINOPHEN 10-325 MG PO TABS: 1.0000 | ORAL_TABLET | Freq: Four times a day (QID) | ORAL | 0 refills | Status: DC | PRN

## 2020-10-23 NOTE — Telephone Encounter (Signed)
Patient called to check the status of her refill as shse has not heard anything.  Dr.Miller is only in office on Tuesday's and Wednesday's  I will send to in office provider to review and advise

## 2020-10-23 NOTE — Therapy (Signed)
[facility] [address] [place], [place], [address] Phone: [phone]   Fax:  [phone]  Physical Therapy Treatment/Discharge   Patient Details  Name: [name] MRN: [id number] Date of Birth: [birth year] Referring Provider (PT): Dr    Neta Mends Date: 10/22/2020   PT End of Session - 10/22/20 1606    Visit Number 21    Number of Visits 22    Date for PT Re-Evaluation 10/25/20    Authorization Type MCR BCBS Progress note at visit 20    PT Start Time 1546    PT Stop Time 1630    PT Time Calculation (min) 44 min    Activity Tolerance Patient tolerated treatment well    Behavior During Therapy [facility] for tasks assessed/performed           Past Medical History:  Diagnosis Date  . Asymptomatic varicose veins   . Cancer ([facility])    breast   . Diverticulosis   . DM type 2 (diabetes mellitus, type 2) ([facility]) 10/23/2014  . Dysuria   . GERD (gastroesophageal reflux disease)   . Hiatal hernia   . Hyperlipidemia   . Malignant neoplasm of breast (female), unspecified site   . Migraine without aura, without mention of intractable migraine without mention of status migrainosus   . Other abnormal blood chemistry   . Pain in joint, pelvic region and thigh   . Shingles    in eyes  . Stricture and stenosis of esophagus   . Trigger finger (acquired)   . Unspecified cataract   . Unspecified glaucoma(365.9)     Past Surgical History:  Procedure Laterality Date  . APPENDECTOMY  1973   Dr. Wilber Bihari  . BREAST LUMPECTOMY Right 08/29/2008   Dr. Harriette Bouillon  . ESOPHAGEAL DILATION    . NASAL POLYP EXCISION  10/2015  . POLYPECTOMY  11/20/14   Uterine Polyp Removed    There were no vitals filed for this visit.   Subjective Assessment - 10/22/20 1558    Subjective Patient reports her back pain comes and goes. Her whole right side is hurtingher today. She continues to have her back flair up when she walks.    Pertinent History DM,  glaucoma, reports a myriad of other issues with her health, GERD    How long can you stand comfortably? unable to stand to do ADL's for more then a few minutes    How long can you walk comfortably? limited sidtances    Patient Stated Goals have less pain and walk better    Currently in Pain? Yes    Pain Score 6     Pain Location Back    Pain Orientation Right    Pain Descriptors / Indicators Aching    Pain Type Chronic pain    Pain Onset More than a month ago    Pain Frequency Constant    Aggravating Factors  standing and walking    Pain Relieving Factors rest    Effect of Pain on Daily Activities pain with activity              [facility] PT Assessment - 10/23/20 0001      Strength   Right Hip Flexion 5/5    Right Hip ABduction 5/5    Right Hip ADduction 5/5    Left Hip Flexion 5/5    Left Hip Extension 0/5    Left Hip ABduction 4+/5    Left Hip ADduction 4+/5    Right Knee  Flexion 5/5    Right Knee Extension 5/5    Left Knee Flexion 5/5    Left Knee Extension 5/5      Flexibility   Soft Tissue Assessment /Muscle Length yes      Palpation   Palpation comment continued tenderness to palpation in the right sacral area      Ambulation/Gait   Gait Comments continues to walk with a flexed posture                         [facility] Adult PT Treatment/Exercise - 10/23/20 0001      Self-Care   Other Self-Care Comments  reviewed how to progress activity at home; reviewed progression of activity      Lumbar Exercises: Aerobic   Nustep 8 min L3      Lumbar Exercises: Standing   Heel Raises Limitations x20      Lumbar Exercises: Seated   LAQ on Chair Limitations x20 2lb weight    Other Seated Lumbar Exercises seated clamshells 3x10 green  TB; bilateral ; hamstring curl red x20; reviewed how to do low back flexion stretch at a table fowd and lateral      Knee/Hip Exercises: Standing   Heel Raises Limitations x20    Hip Flexion AROM;Stengthening;Both;3 sets;10  reps    Lateral Step Up Limitations 2x10 46inch with left    Forward Step Up Limitations 2x10 6 inch    Functional Squat Limitations 2x10     Other Standing Knee Exercises slow march x20 with min UE hand gold                  PT Education - 10/22/20 1601    Education Details reviewed final HEP    Person(s) Educated Patient    Methods Explanation;Demonstration;Verbal cues;Tactile cues    Comprehension Verbalized understanding;Returned demonstration;Verbal cues required;Tactile cues required            PT Short Term Goals - 10/23/20 1322      PT SHORT TERM GOAL #1   Title Patientwill transfer supine to sit  and sit to supine with supervision    Baseline able to perform    Period Weeks    Status Achieved    Target Date 07/08/20      PT SHORT TERM GOAL #2   Title Patient will dmeonstrate full active hip flexion to 90 degrees    Baseline able to do in the nu-step    Time 4    Period Weeks    Status Achieved    Target Date 07/08/20      PT SHORT TERM GOAL #3   Title Patient will ambaulte 300' without increased pain with good posture without cuing with LRAD    Baseline conbtinues to have pain with a crutch but none walking with walker or 2 canes    Time 4    Period Weeks    Status Achieved      PT SHORT TERM GOAL #4   Title Therapy will review FOTO    Baseline reviewed    Time 4    Period Weeks    Status Achieved    Target Date 11/15/17             PT Long Term Goals - 10/23/20 1323      PT LONG TERM GOAL #1   Title Patient will ambualte 3000' with LRAD without pain    Baseline can do with a walker but  not with 1 cane without pain    Time 8    Period Weeks    Status Partially Met      PT LONG TERM GOAL #2   Title Patient will demonstrate a 54 % on FOTO to demonstrate improved fucntion    Baseline 44%    Time 8    Period Weeks    Status On-going      PT LONG TERM GOAL #3   Title Patient will transfer sit to stand without use of the hands without  increased pain    Baseline still needs a small boost    Time 8    Period Weeks    Status On-going      PT LONG TERM GOAL #4   Title Patient to be able to stand and ambulate without increase in pain for at least 60 minutes in order to improve QOL and assist in return to prior level of activity     Baseline can stsand for about a half hour    Time 8    Period Weeks    Status On-going                 Plan - 10/22/20 1621    Clinical Impression Statement Patient advised to return for follow up of her back. She continues to have lower back pain that increases when she stands and walks.  She has a walker. She was advised if the pain gets to bad to switch back to her walker. she has a full exercise program that she can perfrom at home. Therapy reviewed some stretching for the lower back and hip today for her to continue at home. She hasmade good fucntional gains. She has been able to reach down to the floor. She is standing and walking with her ADL's although too much causes pain.    Personal Factors and Comorbidities Comorbidity 3+    Comorbidities GERD, DM, right hip replacement; bilateral LE edema    Examination-Activity Limitations Carry;Squat;Stairs;Dressing;Locomotion Level;Transfers;Bed Mobility    Examination-Participation Restrictions Meal Prep;Cleaning;Community Activity;Laundry;Yard Work    [employer] Evolving/Moderate complexity    Clinical Decision Making Moderate    Rehab Potential Good    PT Frequency 1x / week    PT Duration 6 weeks    PT Treatment/Interventions ADLs/Self Care Home Management;Cryotherapy;Electrical Stimulation;Iontophoresis 4mg /ml Dexamethasone;Moist Heat;Therapeutic activities;Functional mobility training;Gait training;Therapeutic exercise;Balance training;Neuromuscular re-education;Patient/family education;Dry needling;Ultrasound;Manual techniques;Taping    PT Next Visit Plan continue to progress steps and standing exercsies    PT  Home Exercise Plan has a good exercise program at home.    Consulted and Agree with Plan of Care Patient           Patient will benefit from skilled therapeutic intervention in order to improve the following deficits and impairments:  Abnormal gait,Cardiopulmonary status limiting activity,Decreased activity tolerance,Decreased endurance,Decreased range of motion,Decreased strength,Difficulty walking,Increased edema,Decreased balance,Decreased mobility,Impaired flexibility,Obesity,Increased muscle spasms,Pain,Improper body mechanics,Decreased knowledge of use of DME  Visit Diagnosis: Pain in left hip  Stiffness of left hip, not elsewhere classified  Difficulty in walking, not elsewhere classified  Muscle weakness (generalized)  Pain in right hip  PHYSICAL THERAPY DISCHARGE SUMMARY  Visits from Start of Care: 21  Current functional level related to goals / functional outcomes: Significant improvement in general function and movemnet   Remaining deficits: Pain in back with activity   Education / Equipment: HEP   Plan: Patient agrees to discharge.  Patient goals were not met. Patient is being discharged due  to being pleased with the current functional level.  ?????       Problem List Patient Active Problem List   Diagnosis Date Noted  . Uveitic glaucoma of right eye, moderate stage 08/08/2020  . Pain and swelling of right lower leg 07/21/2020  . Aftercare following joint replacement surgery 01/23/2020  . Neurogenic claudication due to lumbar spinal stenosis 05/28/2019  . Right groin pain 05/28/2019  . Polymyalgia rheumatica ([place]) 10/09/2018  . BMI 40.0-44.9, adult ([place]) 10/09/2018  . Foraminal stenosis of lumbar region 05/11/2018  . 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 On prednisone therapy 02/14/2018  . Herpes zoster 10/16/2017  . Chronic pain of both shoulders 08/10/2017  . Prediabetes 05/26/2017  . Primary osteoarthritis of both hips  03/15/2017  . Trochanteric bursitis of left hip 03/15/2017  . Recurrent epistaxis 12/09/2016  . Dysuria 11/03/2016  . Insomnia 11/03/2016  . Glaucoma 05/05/2016  . LPRD (laryngopharyngeal reflux disease) 01/26/2016  . Erosive esophagitis 11/27/2015  . Schatzki's ring of distal esophagus 11/27/2015  . Chronic sinusitis 10/16/2015  . Deviated nasal septum 10/16/2015  . Nasal polyp 10/16/2015  . Osteoporosis 08/15/2015  . Dysphagia, pharyngoesophageal phase 04/01/2015  . Endometrial polyp 10/24/2014  . Fatigue 07/09/2014  . Hyperlipidemia   . Breast cancer, right ([place]) 02/01/2014  . Malignant melanoma of arm ([place]) 11/23/2013  . Nasal septum ulceration 10/16/2013  . Seborrheic keratosis 10/16/2013  . Vitamin D deficiency 02/08/2013  . Sciatica of right side 01/24/2013  . Esophageal ulcer 01/24/2013  . Esophageal stenosis 01/24/2013  . Choroidal nevus of right eye 10/27/2012  . Impacted cerumen of right ear 10/11/2012  . Frequency 01/19/2012  . Gastroesophageal reflux disease 09/01/2011  . 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 Horseshoe retinal tear of right eye 08/03/2011  . 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 H/O cataract extraction 04/30/2011  . Borderline glaucoma steroid responder 04/30/2011  . Class 2 obesity due to excess calories without serious comorbidity with body mass index (BMI) of 39.0 to 39.9 in adult 10/16/2008  . Breast cancer of lower-outer quadrant of right female breast ([place]) 08/29/2008    Carney Living PT DPT  10/23/2020, 1:26 PM  [facility] [address] [place], [place], [address] Phone: [phone]   Fax:  [phone]  Name: [name] MRN: [id number] Date of Birth: 1945/12/01

## 2020-10-28 DIAGNOSIS — R5383 Other fatigue: Secondary | ICD-10-CM | POA: Diagnosis not present

## 2020-10-28 DIAGNOSIS — Z6841 Body Mass Index (BMI) 40.0 and over, adult: Secondary | ICD-10-CM | POA: Diagnosis not present

## 2020-10-28 DIAGNOSIS — D039 Melanoma in situ, unspecified: Secondary | ICD-10-CM | POA: Diagnosis not present

## 2020-10-28 DIAGNOSIS — M2550 Pain in unspecified joint: Secondary | ICD-10-CM | POA: Diagnosis not present

## 2020-10-28 DIAGNOSIS — M353 Polymyalgia rheumatica: Secondary | ICD-10-CM | POA: Diagnosis not present

## 2020-10-28 DIAGNOSIS — M150 Primary generalized (osteo)arthritis: Secondary | ICD-10-CM | POA: Diagnosis not present

## 2020-11-20 ENCOUNTER — Other Ambulatory Visit: Payer: Self-pay | Admitting: *Deleted

## 2020-11-20 DIAGNOSIS — M5431 Sciatica, right side: Secondary | ICD-10-CM

## 2020-11-20 NOTE — Telephone Encounter (Signed)
Patient called requesting refill Epic LR: 10/23/2020 Contract on [employer] Rx and sent to [place] for approval. (Dr. Sabra Heck out of office)

## 2020-11-21 MED ORDER — HYDROCODONE-ACETAMINOPHEN 10-325 MG PO TABS
1.0000 | ORAL_TABLET | Freq: Four times a day (QID) | ORAL | 0 refills | Status: DC | PRN
Start: 2020-11-21 — End: 2020-12-23

## 2020-11-25 DIAGNOSIS — Z471 Aftercare following joint replacement surgery: Secondary | ICD-10-CM | POA: Diagnosis not present

## 2020-11-25 DIAGNOSIS — Z96642 Presence of left artificial hip joint: Secondary | ICD-10-CM | POA: Diagnosis not present

## 2020-12-02 DIAGNOSIS — Z6841 Body Mass Index (BMI) 40.0 and over, adult: Secondary | ICD-10-CM | POA: Diagnosis not present

## 2020-12-02 DIAGNOSIS — D039 Melanoma in situ, unspecified: Secondary | ICD-10-CM | POA: Diagnosis not present

## 2020-12-02 DIAGNOSIS — M2550 Pain in unspecified joint: Secondary | ICD-10-CM | POA: Diagnosis not present

## 2020-12-02 DIAGNOSIS — M353 Polymyalgia rheumatica: Secondary | ICD-10-CM | POA: Diagnosis not present

## 2020-12-02 DIAGNOSIS — M150 Primary generalized (osteo)arthritis: Secondary | ICD-10-CM | POA: Diagnosis not present

## 2020-12-17 DIAGNOSIS — M5416 Radiculopathy, lumbar region: Secondary | ICD-10-CM | POA: Diagnosis not present

## 2020-12-18 DIAGNOSIS — K317 Polyp of stomach and duodenum: Secondary | ICD-10-CM | POA: Diagnosis not present

## 2020-12-18 DIAGNOSIS — K222 Esophageal obstruction: Secondary | ICD-10-CM | POA: Diagnosis not present

## 2020-12-18 DIAGNOSIS — R1310 Dysphagia, unspecified: Secondary | ICD-10-CM | POA: Diagnosis not present

## 2020-12-18 DIAGNOSIS — K3189 Other diseases of stomach and duodenum: Secondary | ICD-10-CM | POA: Diagnosis not present

## 2020-12-18 DIAGNOSIS — K2930 Chronic superficial gastritis without bleeding: Secondary | ICD-10-CM | POA: Diagnosis not present

## 2020-12-23 ENCOUNTER — Other Ambulatory Visit: Payer: Self-pay | Admitting: *Deleted

## 2020-12-23 DIAGNOSIS — S76912D Strain of unspecified muscles, fascia and tendons at thigh level, left thigh, subsequent encounter: Secondary | ICD-10-CM | POA: Diagnosis not present

## 2020-12-23 DIAGNOSIS — M5416 Radiculopathy, lumbar region: Secondary | ICD-10-CM | POA: Diagnosis not present

## 2020-12-23 DIAGNOSIS — M5431 Sciatica, right side: Secondary | ICD-10-CM

## 2020-12-23 MED ORDER — HYDROCODONE-ACETAMINOPHEN 10-325 MG PO TABS
1.0000 | ORAL_TABLET | Freq: Four times a day (QID) | ORAL | 0 refills | Status: DC | PRN
Start: 2020-12-23 — End: 2021-01-21

## 2020-12-23 NOTE — Telephone Encounter (Signed)
Patient requested refill Epic LR: 11/21/2020 Contract on File Pended Rx and sent to Dr. Sabra Heck for approval.

## 2020-12-24 DIAGNOSIS — K2930 Chronic superficial gastritis without bleeding: Secondary | ICD-10-CM | POA: Diagnosis not present

## 2020-12-25 ENCOUNTER — Other Ambulatory Visit: Payer: Self-pay

## 2020-12-25 ENCOUNTER — Ambulatory Visit (INDEPENDENT_AMBULATORY_CARE_PROVIDER_SITE_OTHER): Payer: Medicare Other | Admitting: Podiatry

## 2020-12-25 ENCOUNTER — Encounter: Payer: Self-pay | Admitting: Podiatry

## 2020-12-25 DIAGNOSIS — M79675 Pain in left toe(s): Secondary | ICD-10-CM | POA: Diagnosis not present

## 2020-12-25 DIAGNOSIS — E1142 Type 2 diabetes mellitus with diabetic polyneuropathy: Secondary | ICD-10-CM | POA: Diagnosis not present

## 2020-12-25 DIAGNOSIS — B351 Tinea unguium: Secondary | ICD-10-CM

## 2020-12-25 DIAGNOSIS — M79674 Pain in right toe(s): Secondary | ICD-10-CM | POA: Diagnosis not present

## 2020-12-25 NOTE — Progress Notes (Addendum)
Complaint:  Visit Type: Patient returns to my office for continued preventative foot care services. Complaint: Patient states" my third toenails both feet  have grown long and thick and become painful to walk and wear shoes"  The patient presents for preventative foot care services. No changes to ROS.  Patient requests diabetic foot exam.  Podiatric Exam: Vascular: dorsalis pedis and posterior tibial pulses are weakly  palpable bilateral due to foot swelling.. Capillary return is immediate. Temperature gradient is WNL. Skin turgor WNL  Sensorium: Normal Semmes Weinstein monofilament test. Normal tactile sensation bilaterally. Nail Exam: Pt has thick disfigured discolored nails with subungual debris noted first and third third toenails both feet. Ulcer Exam: There is no evidence of ulcer or pre-ulcerative changes or infection. Orthopedic Exam: Muscle tone and strength are WNL. No limitations in general ROM. No crepitus or effusions noted. Foot type and digits show no abnormalities. Bony prominences are unremarkable. Skin: No Porokeratosis. No infection or ulcers  Diagnosis:  Onychomycosis, , Pain in right toe, pain in left toes  Treatment & Plan Procedures and Treatment: Consent by patient was obtained for treatment procedures.   Debridement of mycotic and hypertrophic toenails, 1 through 5 bilateral and clearing of subungual debris. No ulceration, no infection noted.  Diabetic foot exam performed with no vascular or neurologic pathology. Return Visit-Office Procedure: Patient instructed to return to the office for a follow up visit 10 weeks  for continued evaluation and treatment.    Gardiner Barefoot DPM

## 2020-12-26 DIAGNOSIS — M5416 Radiculopathy, lumbar region: Secondary | ICD-10-CM | POA: Diagnosis not present

## 2020-12-31 DIAGNOSIS — M5416 Radiculopathy, lumbar region: Secondary | ICD-10-CM | POA: Diagnosis not present

## 2021-01-02 DIAGNOSIS — M5416 Radiculopathy, lumbar region: Secondary | ICD-10-CM | POA: Diagnosis not present

## 2021-01-06 DIAGNOSIS — M5416 Radiculopathy, lumbar region: Secondary | ICD-10-CM | POA: Diagnosis not present

## 2021-01-08 DIAGNOSIS — M5416 Radiculopathy, lumbar region: Secondary | ICD-10-CM | POA: Diagnosis not present

## 2021-01-13 ENCOUNTER — Other Ambulatory Visit: Payer: Self-pay | Admitting: Family Medicine

## 2021-01-13 DIAGNOSIS — M353 Polymyalgia rheumatica: Secondary | ICD-10-CM

## 2021-01-13 DIAGNOSIS — R7303 Prediabetes: Secondary | ICD-10-CM

## 2021-01-15 DIAGNOSIS — M5416 Radiculopathy, lumbar region: Secondary | ICD-10-CM | POA: Diagnosis not present

## 2021-01-21 ENCOUNTER — Other Ambulatory Visit: Payer: Self-pay | Admitting: *Deleted

## 2021-01-21 DIAGNOSIS — M5431 Sciatica, right side: Secondary | ICD-10-CM

## 2021-01-21 MED ORDER — HYDROCODONE-ACETAMINOPHEN 10-325 MG PO TABS: 1.0000 | ORAL_TABLET | Freq: Four times a day (QID) | ORAL | 0 refills | Status: DC | PRN

## 2021-01-21 NOTE — Telephone Encounter (Signed)
Patient requested refill Epic LR: 12/23/2020 Contract on File Has an appointment with Dr. Sabra Heck scheduled for 01/27/2021 (previous [facility] patient)

## 2021-01-22 ENCOUNTER — Other Ambulatory Visit: Payer: Self-pay

## 2021-01-22 DIAGNOSIS — M5431 Sciatica, right side: Secondary | ICD-10-CM

## 2021-01-22 MED ORDER — HYDROCODONE-ACETAMINOPHEN 10-325 MG PO TABS: 1.0000 | ORAL_TABLET | Freq: Four times a day (QID) | ORAL | 0 refills | Status: DC | PRN

## 2021-01-22 NOTE — Telephone Encounter (Signed)
Script send

## 2021-01-22 NOTE — Telephone Encounter (Signed)
Incoming call received from patient stating [facility] not [facility] in [facility] and she was told to call our office and have rx forwarded to [facility] in [place].    [facility] and spoke with the pharmacist and he confirmed that they do not have 10-325 mg in stock

## 2021-01-23 ENCOUNTER — Other Ambulatory Visit: Payer: Medicare Other

## 2021-01-27 ENCOUNTER — Encounter: Payer: Self-pay | Admitting: Family Medicine

## 2021-01-27 ENCOUNTER — Other Ambulatory Visit: Payer: Self-pay | Admitting: Family Medicine

## 2021-01-27 ENCOUNTER — Other Ambulatory Visit: Payer: Self-pay

## 2021-01-27 ENCOUNTER — Ambulatory Visit (INDEPENDENT_AMBULATORY_CARE_PROVIDER_SITE_OTHER): Payer: Medicare Other | Admitting: Family Medicine

## 2021-01-27 VITALS — BP 148/98 | HR 90 | Temp 97.1°F | Ht 64.0 in | Wt 264.0 lb

## 2021-01-27 DIAGNOSIS — B029 Zoster without complications: Secondary | ICD-10-CM

## 2021-01-27 DIAGNOSIS — R7303 Prediabetes: Secondary | ICD-10-CM

## 2021-01-27 DIAGNOSIS — C4361 Malignant melanoma of right upper limb, including shoulder: Secondary | ICD-10-CM | POA: Diagnosis not present

## 2021-01-27 DIAGNOSIS — M353 Polymyalgia rheumatica: Secondary | ICD-10-CM | POA: Diagnosis not present

## 2021-01-27 DIAGNOSIS — M7062 Trochanteric bursitis, left hip: Secondary | ICD-10-CM | POA: Diagnosis not present

## 2021-01-27 NOTE — Progress Notes (Addendum)
Provider:  Alain Honey, MD  Careteam: Patient Care Team: Wardell Honour, MD as PCP - General (Family Medicine) Jettie Booze, MD as PCP - Cardiology (Cardiology) Magrinat, Virgie Dad, MD as Consulting Physician (Oncology) Erroll Luna, MD as Consulting Physician (General Surgery) Harvie Heck, MD as Physician Assistant (Internal Medicine) Chyrel Masson, DO as Anesthesiologist (Otolaryngology) Clinger, Chrystie Nose, MD (Otolaryngology) Turner Daniels, MD as Referring Physician (Internal Medicine)  PLACE OF SERVICE:  [place]  Advanced Directive information    Allergies  Allergen Reactions   Cymbalta [Duloxetine Hcl] Other (See Comments)    sleepiness   Gabapentin Other (See Comments)    sleepiness   Adhesive [Tape] Dermatitis   Betimol [Timolol Maleate]     Allergic to preservatives    Cosopt [Dorzolamide Hcl-Timolol Mal] Other (See Comments)    Turns eye red, increases eye pressure   Penicillins    Prednisolone Acetate Other (See Comments)    Caused increased eye pressure   Tamoxifen Other (See Comments)    Vaginal bleeding   Thimerosal     Makes pt eye red    Iodine-131 Rash    Dye from PET scan only, states has had since and did ok    Chief Complaint  Patient presents with   Medical Management of Chronic Issues    Patient presents today for 4 month follow-up.     HPI: Patient is a 75 y.o. female 75 year old lady here to follow-up recent diagnosis of shingles and migraine.  She was seen in the emergency room with the above complaints and given appropriately Valtrex for shingles.  This was started 1 day after the development of symptoms.  She has a remote history of migraine and then developed a headache in relation to the stress of shingles.  She was given morphine and attempt to try in the ER.  That quickly resolved.  She spent some time today telling me about her past history.  In 2008 she had developed shingles in her right eye with subsequent  glaucoma.  She had mammogram and 2011 which showed breast cancer she had a lumpectomy and radiation treatment.  In 2015 she was diagnosed with metastatic melanoma due to a lump, not a mole, and her right wrist.  She was treated with immunotherapy, Yervoy.  She has had subsequent follow-up scans which showed no progression and in fact may be regression of the metastatic lesion on her right wrist.  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  She has been on prednisone for that, currently taking 2 mg.  She does describe now some hip pain that is different than hip pain she had that required hip replacements bilaterally.  Review of Systems:  Review of Systems  Constitutional: Negative.   HENT: Negative.    Respiratory: Negative.    Cardiovascular: Negative.   Musculoskeletal:  Positive for myalgias.  Neurological: Negative.   Psychiatric/Behavioral: Negative.    All other systems reviewed and are negative.  Past Medical History:  Diagnosis Date   Asymptomatic varicose veins    Cancer ([place])    breast    Diverticulosis    DM type 2 (diabetes mellitus, type 2) ([place]) 10/23/2014   Dysuria    GERD (gastroesophageal reflux disease)    Hiatal hernia    Hyperlipidemia    Malignant neoplasm of breast (female), unspecified site    Migraine without aura, without mention of intractable migraine without mention of status migrainosus    Other abnormal blood chemistry  Pain in joint, pelvic region and thigh    Shingles    in eyes   Stricture and stenosis of esophagus    Trigger finger (acquired)    Unspecified cataract    Unspecified glaucoma(365.9)    Past Surgical History:  Procedure Laterality Date   APPENDECTOMY  1973   Dr. Linzie Collin   BREAST LUMPECTOMY Right 08/29/2008   Dr. Erroll Luna   ESOPHAGEAL DILATION     NASAL POLYP EXCISION  10/2015   POLYPECTOMY  11/20/14   Uterine Polyp Removed   Social History:   reports that she has never smoked. She has never used smokeless tobacco. She  reports that she does not drink alcohol and does not use drugs.  Family History  Problem Relation Age of Onset   Hypertension Mother    Hyperlipidemia Mother    Alzheimer's disease Mother    Emphysema Father    COPD Father     Medications: Patient's Medications  New Prescriptions   No medications on file  Previous Medications   ASCORBIC ACID (VITAMIN C) 1000 MG TABLET    Take 1,000 mg by mouth daily.   CALCIUM CITRATE-VITAMIN D 250-200 MG-UNIT TABS    Take 500 mg by mouth daily.    CHOLECALCIFEROL (VITAMIN D-3) 5000 UNITS TABS    Take by mouth daily.   CYANOCOBALAMIN 5000 MCG CAPS    cyanocobalamin (vitamin B-12) 5,000 mcg capsule  Take 1 capsule every day by oral route.   FLUOROMETHOLONE (FML) 0.1 % OPHTHALMIC SUSPENSION    INSTILL ONE DROP IN RIGHT EYE DAILY   HYDROCODONE-ACETAMINOPHEN (NORCO) 10-325 MG TABLET    Take 1 tablet by mouth every 6 (six) hours as needed for severe pain.   OMEPRAZOLE (PRILOSEC) 20 MG CAPSULE    Take 20 mg by mouth every other day.    OMEPRAZOLE (PRILOSEC) 40 MG CAPSULE    Take 40 mg by mouth every other day.    OVER THE COUNTER MEDICATION    1 capsule 3 (three) times daily. Balance of [employer] and Fruit.   PREDNISONE (DELTASONE) 5 MG TABLET    Take 2 mg by mouth daily.   TIMOLOL (TIMOPTIC) 0.5 % OPHTHALMIC SOLUTION    Place 1 drop into the right eye 2 times daily.   ZINC GLUCONATE 50 MG TABLET    Take by mouth.  Modified Medications   No medications on file  Discontinued Medications   No medications on file    Physical Exam:  Vitals:   01/27/21 1329  BP: (!) 148/98  Pulse: 90  Temp: (!) 97.1 F (36.2 C)  TempSrc: Temporal  SpO2: 96%  Weight: 264 lb (119.7 kg)  Height: 5\' 4"  (1.626 m)   Body mass index is 45.32 kg/m. Wt Readings from Last 3 Encounters:  01/27/21 264 lb (119.7 kg)  09/29/20 267 lb 9.6 oz (121.4 kg)  09/25/20 269 lb (122 kg)      Labs reviewed: Basic Metabolic Panel: Recent Labs    03/13/20 1405  07/23/20 0110 09/22/20 1132  NA 138 137 139  K 4.5 3.8 4.3  CL 101 98 98  CO2 25 28 29   GLUCOSE 116 120* 116*  BUN 19 12 12   CREATININE 0.63 0.63 0.55*  CALCIUM 10.0 9.8 9.7   Liver Function Tests: Recent Labs    09/22/20 1132  AST 16  ALT 13  BILITOT 0.5  PROT 7.0   No results for input(s): LIPASE, AMYLASE in the last 8760 hours. No results for  input(s): AMMONIA in the last 8760 hours. CBC: Recent Labs    03/13/20 1405 07/23/20 0110 09/22/20 1132  WBC 8.6 9.9 8.0  NEUTROABS 7,164  --  6,176  HGB 12.8 13.1 13.2  HCT 38.7 42.9 39.5  MCV 93.7 97.3 89.4  PLT 271 294 308   Lipid Panel: Recent Labs    09/22/20 1132  CHOL 265*  HDL 67  LDLCALC 159*  TRIG 217*  CHOLHDL 4.0   TSH: No results for input(s): TSH in the last 8760 hours. A1C: Lab Results  Component Value Date   HGBA1C 6.3 (H) 09/22/2020     Assessment/Plan  1. Prediabetes Will check A1c today.  It was 6.3 when last checked 6 months ago - Hemoglobin A1c  2. Malignant melanoma of right upper extremity ([place]) Metastatic lesion is smaller per her history  Alain Honey, MD [facility] [phone]

## 2021-01-28 LAB — HEMOGLOBIN A1C
Hgb A1c MFr Bld: 6.2 % of total Hgb — ABNORMAL HIGH (ref <5.7)
Mean Plasma Glucose: 131 mg/dL
eAG (mmol/L): 7.3 mmol/L

## 2021-01-28 LAB — SEDIMENTATION RATE: Sed Rate: 46 mm/h — ABNORMAL HIGH (ref 0–30)

## 2021-02-10 DIAGNOSIS — M5416 Radiculopathy, lumbar region: Secondary | ICD-10-CM | POA: Diagnosis not present

## 2021-02-17 DIAGNOSIS — Z6841 Body Mass Index (BMI) 40.0 and over, adult: Secondary | ICD-10-CM | POA: Diagnosis not present

## 2021-02-17 DIAGNOSIS — M2550 Pain in unspecified joint: Secondary | ICD-10-CM | POA: Diagnosis not present

## 2021-02-17 DIAGNOSIS — M353 Polymyalgia rheumatica: Secondary | ICD-10-CM | POA: Diagnosis not present

## 2021-02-17 DIAGNOSIS — M150 Primary generalized (osteo)arthritis: Secondary | ICD-10-CM | POA: Diagnosis not present

## 2021-02-18 DIAGNOSIS — M5416 Radiculopathy, lumbar region: Secondary | ICD-10-CM | POA: Diagnosis not present

## 2021-02-20 ENCOUNTER — Other Ambulatory Visit: Payer: Self-pay | Admitting: *Deleted

## 2021-02-20 DIAGNOSIS — M5431 Sciatica, right side: Secondary | ICD-10-CM

## 2021-02-20 MED ORDER — HYDROCODONE-ACETAMINOPHEN 10-325 MG PO TABS: 1.0000 | ORAL_TABLET | Freq: Four times a day (QID) | ORAL | 0 refills | Status: DC | PRN

## 2021-02-20 NOTE — Telephone Encounter (Signed)
Patient requested refill Epic LR: 01/22/2021 Contract on [facility] and sent to [facility] for approval due to Dr. Sabra Heck out of office.

## 2021-02-24 DIAGNOSIS — D481 Neoplasm of uncertain behavior of connective and other soft tissue: Secondary | ICD-10-CM | POA: Diagnosis not present

## 2021-02-24 DIAGNOSIS — E119 Type 2 diabetes mellitus without complications: Secondary | ICD-10-CM | POA: Diagnosis not present

## 2021-02-24 DIAGNOSIS — Z85820 Personal history of malignant melanoma of skin: Secondary | ICD-10-CM | POA: Diagnosis not present

## 2021-02-24 DIAGNOSIS — C4361 Malignant melanoma of right upper limb, including shoulder: Secondary | ICD-10-CM | POA: Diagnosis not present

## 2021-03-06 DIAGNOSIS — L570 Actinic keratosis: Secondary | ICD-10-CM | POA: Diagnosis not present

## 2021-03-06 DIAGNOSIS — Z9289 Personal history of other medical treatment: Secondary | ICD-10-CM | POA: Diagnosis not present

## 2021-03-06 DIAGNOSIS — L821 Other seborrheic keratosis: Secondary | ICD-10-CM | POA: Diagnosis not present

## 2021-03-06 DIAGNOSIS — D3131 Benign neoplasm of right choroid: Secondary | ICD-10-CM | POA: Diagnosis not present

## 2021-03-06 DIAGNOSIS — C799 Secondary malignant neoplasm of unspecified site: Secondary | ICD-10-CM | POA: Diagnosis not present

## 2021-03-06 DIAGNOSIS — C4361 Malignant melanoma of right upper limb, including shoulder: Secondary | ICD-10-CM | POA: Diagnosis not present

## 2021-03-06 DIAGNOSIS — Z853 Personal history of malignant neoplasm of breast: Secondary | ICD-10-CM | POA: Diagnosis not present

## 2021-03-16 DIAGNOSIS — H209 Unspecified iridocyclitis: Secondary | ICD-10-CM | POA: Diagnosis not present

## 2021-03-16 DIAGNOSIS — H4041X2 Glaucoma secondary to eye inflammation, right eye, moderate stage: Secondary | ICD-10-CM | POA: Diagnosis not present

## 2021-03-24 ENCOUNTER — Other Ambulatory Visit: Payer: Self-pay | Admitting: *Deleted

## 2021-03-24 DIAGNOSIS — M5431 Sciatica, right side: Secondary | ICD-10-CM

## 2021-03-24 MED ORDER — HYDROCODONE-ACETAMINOPHEN 10-325 MG PO TABS: 1.0000 | ORAL_TABLET | Freq: Four times a day (QID) | ORAL | 0 refills | Status: DC | PRN

## 2021-03-24 NOTE — Telephone Encounter (Signed)
Patient requested refill.  Epic LR: 02/20/2021 Contract on file Pended Rx and sent to Dr. Sabra Heck for approval.

## 2021-04-01 DIAGNOSIS — C50911 Malignant neoplasm of unspecified site of right female breast: Secondary | ICD-10-CM | POA: Diagnosis not present

## 2021-04-01 DIAGNOSIS — C4361 Malignant melanoma of right upper limb, including shoulder: Secondary | ICD-10-CM | POA: Diagnosis not present

## 2021-04-01 DIAGNOSIS — D3501 Benign neoplasm of right adrenal gland: Secondary | ICD-10-CM | POA: Diagnosis not present

## 2021-04-01 DIAGNOSIS — R911 Solitary pulmonary nodule: Secondary | ICD-10-CM | POA: Diagnosis not present

## 2021-04-01 DIAGNOSIS — I700 Atherosclerosis of aorta: Secondary | ICD-10-CM | POA: Diagnosis not present

## 2021-04-02 ENCOUNTER — Other Ambulatory Visit: Payer: Self-pay

## 2021-04-02 ENCOUNTER — Encounter: Payer: Self-pay | Admitting: Podiatry

## 2021-04-02 ENCOUNTER — Ambulatory Visit (INDEPENDENT_AMBULATORY_CARE_PROVIDER_SITE_OTHER): Payer: Medicare Other | Admitting: Podiatry

## 2021-04-02 DIAGNOSIS — M79675 Pain in left toe(s): Secondary | ICD-10-CM | POA: Diagnosis not present

## 2021-04-02 DIAGNOSIS — E1142 Type 2 diabetes mellitus with diabetic polyneuropathy: Secondary | ICD-10-CM | POA: Diagnosis not present

## 2021-04-02 DIAGNOSIS — B351 Tinea unguium: Secondary | ICD-10-CM

## 2021-04-02 DIAGNOSIS — M79674 Pain in right toe(s): Secondary | ICD-10-CM | POA: Diagnosis not present

## 2021-04-02 NOTE — Progress Notes (Signed)
Complaint:  Visit Type: Patient returns to my office for continued preventative foot care services. Complaint: Patient states" my third toenails both feet  have grown long and thick and become painful to walk and wear shoes"  The patient presents for preventative foot care services. No changes to ROS.  Patient requests diabetic foot exam.  Podiatric Exam: Vascular: dorsalis pedis and posterior tibial pulses are weakly  palpable bilateral due to foot swelling.. Capillary return is immediate. Temperature gradient is WNL. Skin turgor WNL  Sensorium: Normal Semmes Weinstein monofilament test. Normal tactile sensation bilaterally. Nail Exam: Pt has thick disfigured discolored nails with subungual debris noted first and third third toenails both feet. Ulcer Exam: There is no evidence of ulcer or pre-ulcerative changes or infection. Orthopedic Exam: Muscle tone and strength are WNL. No limitations in general ROM. No crepitus or effusions noted. Foot type and digits show no abnormalities. Bony prominences are unremarkable. Skin: No Porokeratosis. No infection or ulcers  Diagnosis:  Onychomycosis, , Pain in right toe, pain in left toes  Treatment & Plan Procedures and Treatment: Consent by patient was obtained for treatment procedures.   Debridement of mycotic and hypertrophic toenails, 1 through 5 bilateral and clearing of subungual debris. No ulceration, no infection noted.   Return Visit-Office Procedure: Patient instructed to return to the office for a follow up visit 12 weeks  for continued evaluation and treatment.    Gardiner Barefoot DPM

## 2021-04-22 DIAGNOSIS — H4041X2 Glaucoma secondary to eye inflammation, right eye, moderate stage: Secondary | ICD-10-CM | POA: Diagnosis not present

## 2021-04-22 DIAGNOSIS — H209 Unspecified iridocyclitis: Secondary | ICD-10-CM | POA: Diagnosis not present

## 2021-04-23 ENCOUNTER — Other Ambulatory Visit: Payer: Self-pay | Admitting: *Deleted

## 2021-04-23 DIAGNOSIS — M5431 Sciatica, right side: Secondary | ICD-10-CM

## 2021-04-23 MED ORDER — HYDROCODONE-ACETAMINOPHEN 10-325 MG PO TABS: 1.0000 | ORAL_TABLET | Freq: Four times a day (QID) | ORAL | 0 refills | Status: DC | PRN

## 2021-04-23 NOTE — Telephone Encounter (Signed)
Patient requested refill.  Epic LR: 03/24/21 Contract on [facility] and sent to [place] for approval. (Dr. Sabra Heck out of office)

## 2021-04-29 DIAGNOSIS — Z85820 Personal history of malignant melanoma of skin: Secondary | ICD-10-CM | POA: Diagnosis not present

## 2021-04-29 DIAGNOSIS — Z08 Encounter for follow-up examination after completed treatment for malignant neoplasm: Secondary | ICD-10-CM | POA: Diagnosis not present

## 2021-04-29 DIAGNOSIS — L820 Inflamed seborrheic keratosis: Secondary | ICD-10-CM | POA: Diagnosis not present

## 2021-04-29 DIAGNOSIS — M7130 Other bursal cyst, unspecified site: Secondary | ICD-10-CM | POA: Diagnosis not present

## 2021-04-29 DIAGNOSIS — Z1283 Encounter for screening for malignant neoplasm of skin: Secondary | ICD-10-CM | POA: Diagnosis not present

## 2021-04-29 DIAGNOSIS — L821 Other seborrheic keratosis: Secondary | ICD-10-CM | POA: Diagnosis not present

## 2021-05-21 ENCOUNTER — Other Ambulatory Visit: Payer: Self-pay | Admitting: *Deleted

## 2021-05-21 DIAGNOSIS — M5431 Sciatica, right side: Secondary | ICD-10-CM

## 2021-05-21 NOTE — Telephone Encounter (Signed)
Patient requested refill.  Epic LR: 04/23/2021 Contract on [facility] and sent to [place] for approval (Dr. Sabra Heck out of office)

## 2021-05-22 MED ORDER — HYDROCODONE-ACETAMINOPHEN 10-325 MG PO TABS: 1.0000 | ORAL_TABLET | Freq: Four times a day (QID) | ORAL | 0 refills | Status: DC | PRN

## 2021-06-08 DIAGNOSIS — M792 Neuralgia and neuritis, unspecified: Secondary | ICD-10-CM | POA: Diagnosis not present

## 2021-06-08 DIAGNOSIS — M2550 Pain in unspecified joint: Secondary | ICD-10-CM | POA: Diagnosis not present

## 2021-06-08 DIAGNOSIS — M150 Primary generalized (osteo)arthritis: Secondary | ICD-10-CM | POA: Diagnosis not present

## 2021-06-08 DIAGNOSIS — M353 Polymyalgia rheumatica: Secondary | ICD-10-CM | POA: Diagnosis not present

## 2021-06-08 DIAGNOSIS — D039 Melanoma in situ, unspecified: Secondary | ICD-10-CM | POA: Diagnosis not present

## 2021-06-08 DIAGNOSIS — Z6841 Body Mass Index (BMI) 40.0 and over, adult: Secondary | ICD-10-CM | POA: Diagnosis not present

## 2021-06-21 IMAGING — CT CT ANGIO CHEST-ABD-PELV FOR DISSECTION W/ AND WO/W CM
2 of 7 series · 13 of 46 positions shown, 15 images · IV contrast (OMNI)
Comparison: 07/23/2020, 07/14/2010

CLINICAL DATA: Difficulty swallowing, chest pain, previous
esophageal dilatation, nausea

EXAM:
CT ANGIOGRAPHY CHEST, ABDOMEN AND PELVIS
TECHNIQUE: Non-contrast CT of the chest was initially obtained.

[Series 5: dissection 3.0 i30f 3 · axial · 0.81mm/px · z∈[+824,+1397]mm · 10 of 219 slices shown, 12 images]
[im 14/219  soft-tissue]
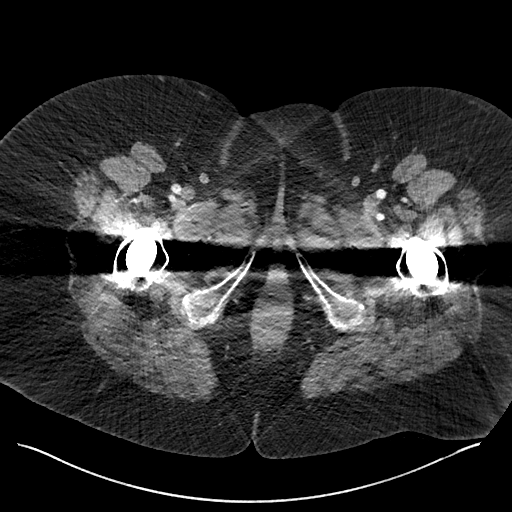
[im 14/219  bone]
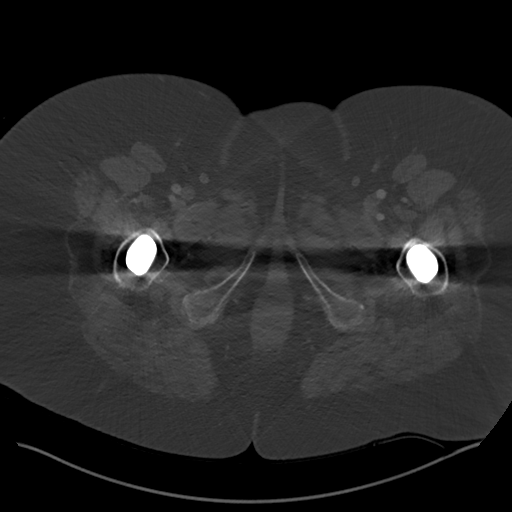
[im 41/219  soft-tissue]
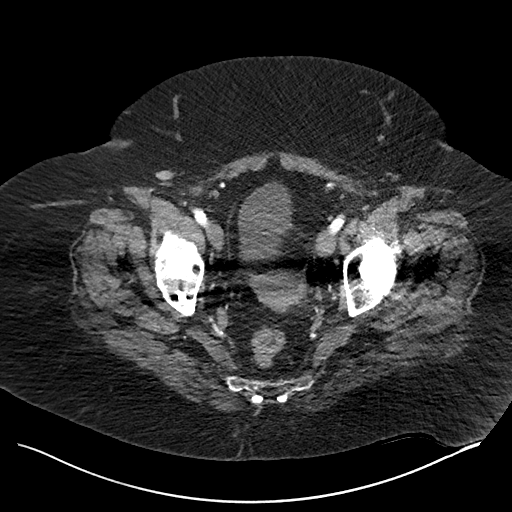
[im 55/219  soft-tissue]
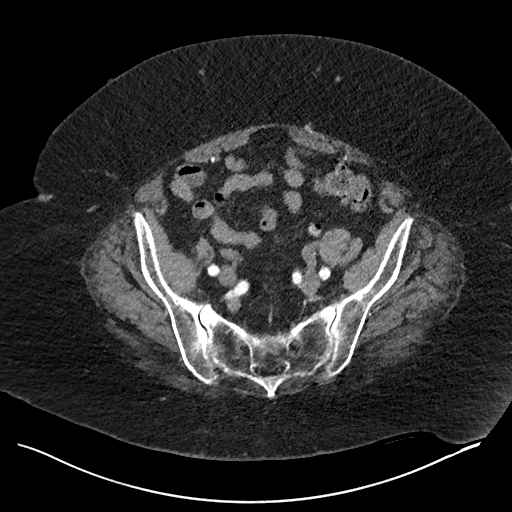
[im 82/219  soft-tissue]
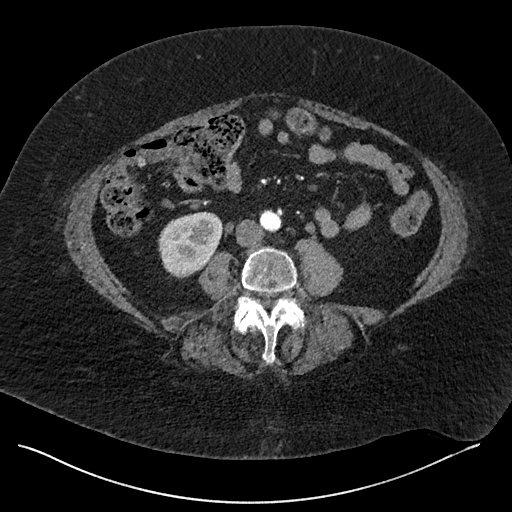
[im 96/219  soft-tissue]
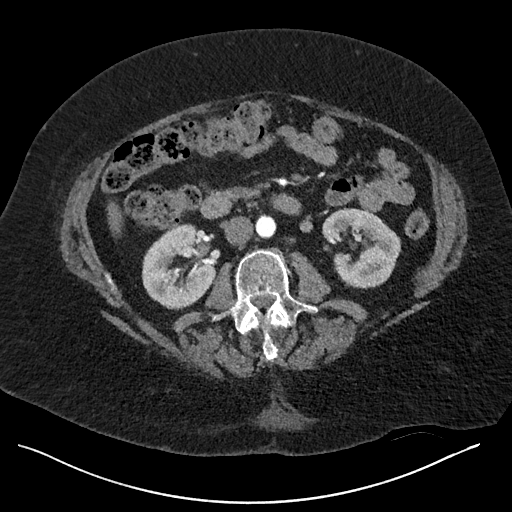
[im 123/219  soft-tissue]
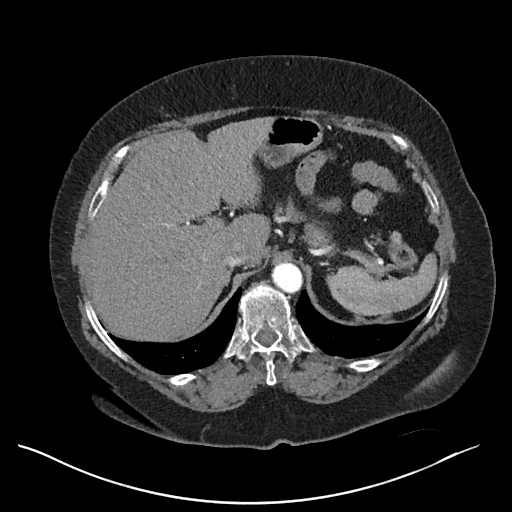
[im 137/219  soft-tissue]
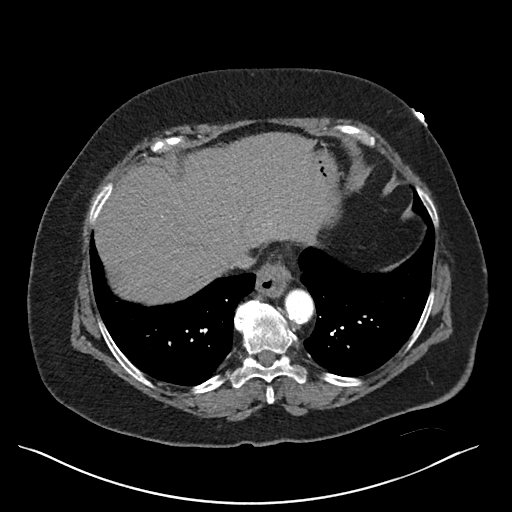
[im 164/219  soft-tissue]
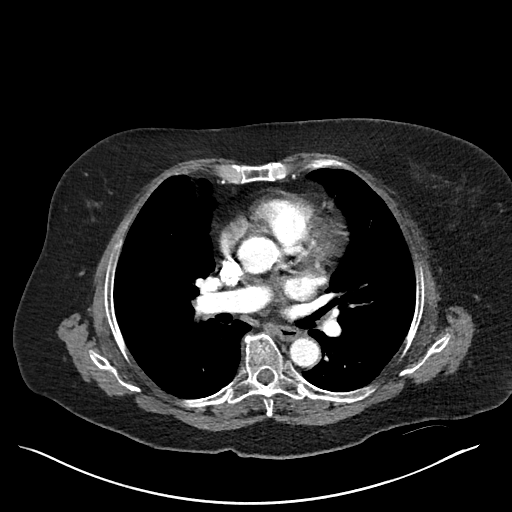
[im 178/219  soft-tissue]
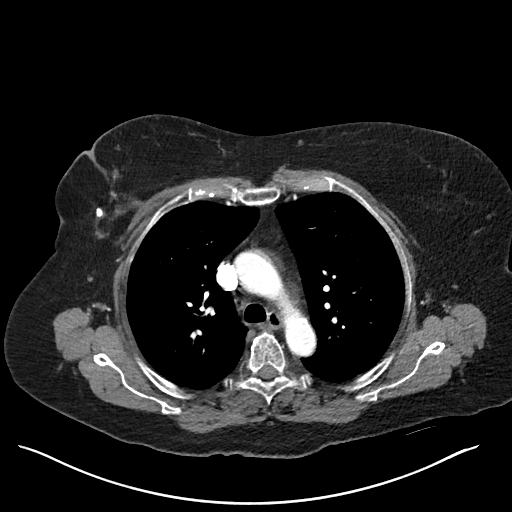
[im 178/219  bone]
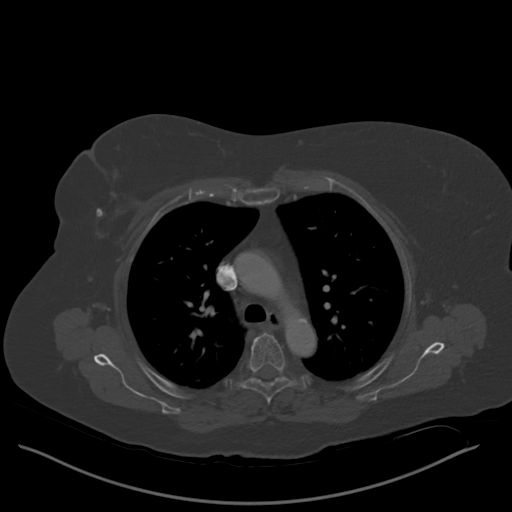
[im 205/219  soft-tissue]
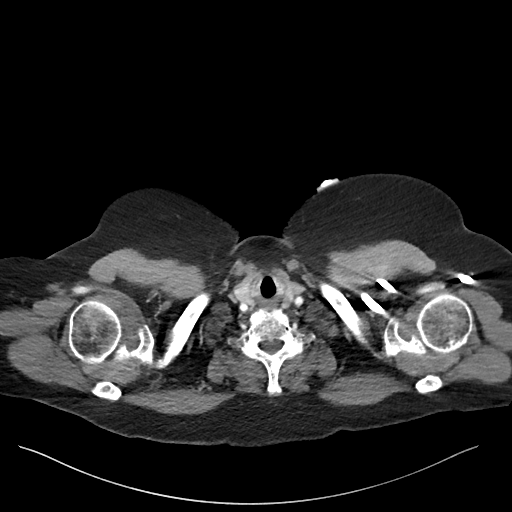

[Series 9: coronals · coronal · 0.79mm/px · 3 of 151 slices shown]
[im 38/151  soft-tissue]
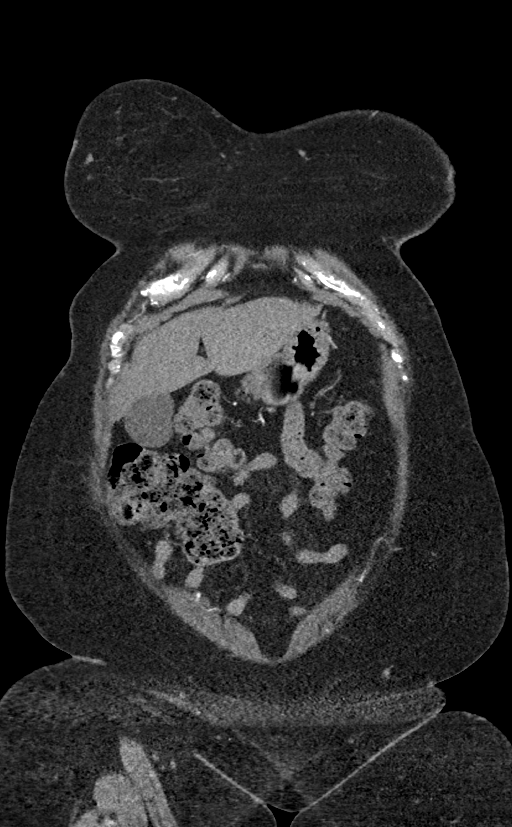
[im 76/151  soft-tissue]
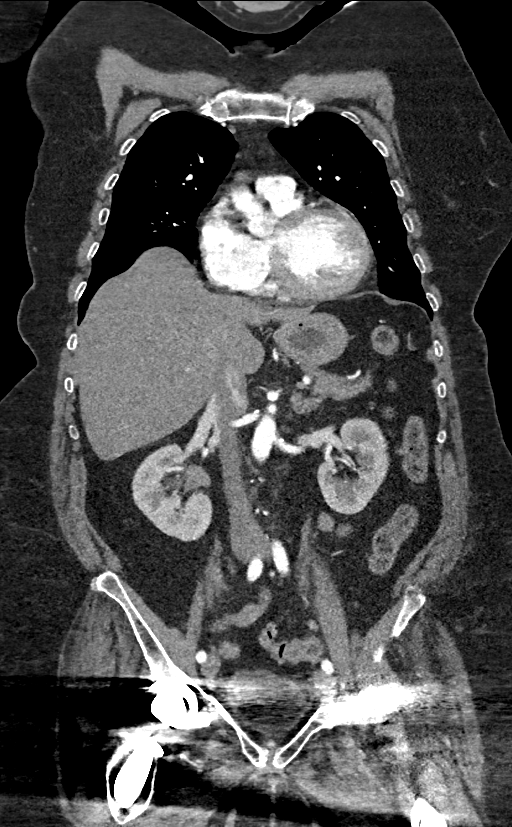
[im 113/151  soft-tissue]
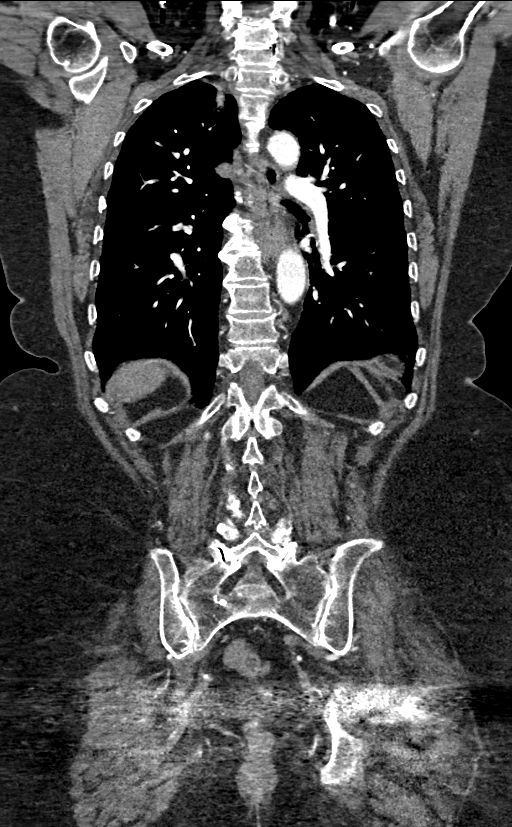

[13 of 46 positions shown; findings below may reference images not displayed]

Multidetector CT imaging through the chest, abdomen and pelvis was
performed using the standard protocol during bolus administration of
intravenous contrast. Multiplanar reconstructed images and MIPs were
obtained and reviewed to evaluate the vascular anatomy.

CONTRAST:  100mL OMNIPAQUE IOHEXOL 350 MG/ML SOLN
FINDINGS: CTA CHEST FINDINGS

Cardiovascular: The thoracic aorta is unremarkable without aneurysm
or dissection. Minimal atherosclerosis.

The heart is unremarkable without pericardial effusion. While not
optimized for the opacification of the pulmonary vasculature, there
is sufficient contrast enhancement to exclude pulmonary emboli.

Mediastinum/Nodes: No enlarged mediastinal, hilar, or axillary lymph
nodes. Thyroid gland, trachea, and esophagus demonstrate no
significant findings. Small hiatal hernia is noted.

Lungs/Pleura: No airspace disease, effusion, or pneumothorax.
Central airways are patent.

Musculoskeletal: No acute or destructive bony lesions. Reconstructed
images demonstrate no additional findings.

Review of the MIP images confirms the above findings.

CTA ABDOMEN AND PELVIS FINDINGS

VASCULAR

Aorta: Normal caliber aorta without aneurysm, dissection, vasculitis
or significant stenosis.

Celiac: Patent without evidence of aneurysm, dissection, vasculitis
or significant stenosis.

SMA: Patent without evidence of aneurysm, dissection, vasculitis or
significant stenosis.

Renals: Both renal arteries are patent without evidence of aneurysm,
dissection, vasculitis, fibromuscular dysplasia or significant
stenosis. Mild atherosclerosis at the bilateral renal ostia.

IMA: Patent without evidence of aneurysm, dissection, vasculitis or
significant stenosis.

Inflow: Patent without evidence of aneurysm, dissection, vasculitis
or significant stenosis.

Veins: No obvious venous abnormality within the limitations of this
arterial phase study.

Review of the MIP images confirms the above findings.

NON-VASCULAR

Hepatobiliary: No focal liver abnormality is seen. No gallstones,
gallbladder wall thickening, or biliary dilatation.

Pancreas: Unremarkable. No pancreatic ductal dilatation or
surrounding inflammatory changes.

Spleen: Normal in size without focal abnormality.

Adrenals/Urinary Tract: Adrenal glands are unremarkable. Kidneys are
normal, without renal calculi, focal lesion, or hydronephrosis.
Bladder is unremarkable.

Stomach/Bowel: No bowel obstruction or ileus. Diverticulosis of the
distal colon without diverticulitis. The appendix is surgically
absent. No bowel wall thickening or inflammatory change.

Lymphatic: No pathologic adenopathy within the abdomen or pelvis.

Reproductive: Uterus and bilateral adnexa are unremarkable.

Other: No free fluid or free gas. Fat containing umbilical hernia.

Musculoskeletal: No acute or destructive bony lesions. Bilateral hip
arthroplasties appear unremarkable. Multilevel spondylosis in the
lumbar spine greatest from L3-4 through L5-S1. Reconstructed images
demonstrate no additional findings.

Review of the MIP images confirms the above findings.
IMPRESSION: 1. Unremarkable thoracoabdominal aorta.
2. No evidence of pulmonary embolus.
3. No acute intrathoracic, intra-abdominal, or intrapelvic process.
4. Small hiatal hernia.
5. Diverticulosis of the distal colon without diverticulitis.
6.  Aortic Atherosclerosis ([id number]).

## 2021-06-22 ENCOUNTER — Other Ambulatory Visit: Payer: Self-pay

## 2021-06-22 DIAGNOSIS — M5431 Sciatica, right side: Secondary | ICD-10-CM

## 2021-06-22 MED ORDER — HYDROCODONE-ACETAMINOPHEN 10-325 MG PO TABS: 1.0000 | ORAL_TABLET | Freq: Four times a day (QID) | ORAL | 0 refills | Status: DC | PRN

## 2021-06-22 NOTE — Telephone Encounter (Signed)
RX last refill on 05/22/2021 and treatment agreement on file from March 2022

## 2021-07-07 DIAGNOSIS — Z961 Presence of intraocular lens: Secondary | ICD-10-CM | POA: Diagnosis not present

## 2021-07-07 DIAGNOSIS — H3581 Retinal edema: Secondary | ICD-10-CM | POA: Diagnosis not present

## 2021-07-07 DIAGNOSIS — H25812 Combined forms of age-related cataract, left eye: Secondary | ICD-10-CM | POA: Diagnosis not present

## 2021-07-07 DIAGNOSIS — B0051 Herpesviral iridocyclitis: Secondary | ICD-10-CM | POA: Diagnosis not present

## 2021-07-07 DIAGNOSIS — D3131 Benign neoplasm of right choroid: Secondary | ICD-10-CM | POA: Diagnosis not present

## 2021-07-08 DIAGNOSIS — Z23 Encounter for immunization: Secondary | ICD-10-CM | POA: Diagnosis not present

## 2021-07-09 ENCOUNTER — Ambulatory Visit: Payer: Medicare Other | Admitting: Podiatry

## 2021-07-21 ENCOUNTER — Other Ambulatory Visit: Payer: Self-pay | Admitting: *Deleted

## 2021-07-21 DIAGNOSIS — M5431 Sciatica, right side: Secondary | ICD-10-CM

## 2021-07-21 NOTE — Telephone Encounter (Signed)
Patient requested refill.  Epic LR: 06/22/2021 Contract date: 09/24/2020 Pended Rx and sent to Dr. Sabra Heck for approval.

## 2021-07-22 MED ORDER — HYDROCODONE-ACETAMINOPHEN 10-325 MG PO TABS: 1.0000 | ORAL_TABLET | Freq: Four times a day (QID) | ORAL | 0 refills | Status: DC | PRN

## 2021-08-10 ENCOUNTER — Other Ambulatory Visit: Payer: Self-pay

## 2021-08-10 ENCOUNTER — Ambulatory Visit (INDEPENDENT_AMBULATORY_CARE_PROVIDER_SITE_OTHER): Payer: Medicare Other | Admitting: Podiatry

## 2021-08-10 ENCOUNTER — Encounter: Payer: Self-pay | Admitting: Podiatry

## 2021-08-10 DIAGNOSIS — E1142 Type 2 diabetes mellitus with diabetic polyneuropathy: Secondary | ICD-10-CM

## 2021-08-10 DIAGNOSIS — M79674 Pain in right toe(s): Secondary | ICD-10-CM | POA: Diagnosis not present

## 2021-08-10 DIAGNOSIS — B351 Tinea unguium: Secondary | ICD-10-CM | POA: Diagnosis not present

## 2021-08-10 DIAGNOSIS — M79675 Pain in left toe(s): Secondary | ICD-10-CM

## 2021-08-10 NOTE — Progress Notes (Signed)
Complaint:  Visit Type: Patient returns to my office for continued preventative foot care services. Complaint: Patient states" my third toenails both feet  have grown long and thick and become painful to walk and wear shoes"  The patient presents for preventative foot care services. No changes to ROS.    Podiatric Exam: Vascular: dorsalis pedis and posterior tibial pulses are weakly  palpable bilateral due to foot swelling.. Capillary return is immediate. Temperature gradient is WNL. Skin turgor WNL  Sensorium: Normal Semmes Weinstein monofilament test. Normal tactile sensation bilaterally. Nail Exam: Pt has thick disfigured discolored nails with subungual debris noted first and third third toenails both feet. Ulcer Exam: There is no evidence of ulcer or pre-ulcerative changes or infection. Orthopedic Exam: Muscle tone and strength are WNL. No limitations in general ROM. No crepitus or effusions noted. Foot type and digits show no abnormalities. Bony prominences are unremarkable. Skin: No Porokeratosis. No infection or ulcers  Diagnosis:  Onychomycosis, , Pain in right toe, pain in left toes  Treatment & Plan Procedures and Treatment: Consent by patient was obtained for treatment procedures.   Debridement of mycotic and hypertrophic toenails, 1 through 5 bilateral and clearing of subungual debris. No ulceration, no infection noted.   Return Visit-Office Procedure: Patient instructed to return to the office for a follow up visit 12 weeks  for continued evaluation and treatment.    Kambree Krauss DPM 

## 2021-08-20 ENCOUNTER — Encounter (HOSPITAL_COMMUNITY): Payer: Self-pay | Admitting: Emergency Medicine

## 2021-08-20 ENCOUNTER — Other Ambulatory Visit: Payer: Self-pay

## 2021-08-20 ENCOUNTER — Emergency Department (HOSPITAL_COMMUNITY): Payer: Medicare Other

## 2021-08-20 ENCOUNTER — Inpatient Hospital Stay (HOSPITAL_COMMUNITY)
Admission: EM | Admit: 2021-08-20 | Discharge: 2021-08-26 | DRG: 493 | Disposition: A | Discharge status: Skilled Nursing Facility | Payer: Medicare Other | Attending: Internal Medicine | Admitting: Internal Medicine

## 2021-08-20 ENCOUNTER — Inpatient Hospital Stay (HOSPITAL_COMMUNITY): Payer: Medicare Other

## 2021-08-20 DIAGNOSIS — Z888 Allergy status to other drugs, medicaments and biological substances status: Secondary | ICD-10-CM

## 2021-08-20 DIAGNOSIS — T380X5A Adverse effect of glucocorticoids and synthetic analogues, initial encounter: Secondary | ICD-10-CM | POA: Diagnosis present

## 2021-08-20 DIAGNOSIS — S82852A Displaced trimalleolar fracture of left lower leg, initial encounter for closed fracture: Secondary | ICD-10-CM | POA: Diagnosis not present

## 2021-08-20 DIAGNOSIS — E785 Hyperlipidemia, unspecified: Secondary | ICD-10-CM | POA: Diagnosis not present

## 2021-08-20 DIAGNOSIS — M818 Other osteoporosis without current pathological fracture: Secondary | ICD-10-CM | POA: Diagnosis not present

## 2021-08-20 DIAGNOSIS — Z820 Family history of epilepsy and other diseases of the nervous system: Secondary | ICD-10-CM | POA: Diagnosis not present

## 2021-08-20 DIAGNOSIS — W19XXXA Unspecified fall, initial encounter: Secondary | ICD-10-CM

## 2021-08-20 DIAGNOSIS — Z8249 Family history of ischemic heart disease and other diseases of the circulatory system: Secondary | ICD-10-CM

## 2021-08-20 DIAGNOSIS — E119 Type 2 diabetes mellitus without complications: Secondary | ICD-10-CM | POA: Diagnosis not present

## 2021-08-20 DIAGNOSIS — E669 Obesity, unspecified: Secondary | ICD-10-CM | POA: Diagnosis present

## 2021-08-20 DIAGNOSIS — Z96643 Presence of artificial hip joint, bilateral: Secondary | ICD-10-CM | POA: Diagnosis present

## 2021-08-20 DIAGNOSIS — D649 Anemia, unspecified: Secondary | ICD-10-CM | POA: Diagnosis not present

## 2021-08-20 DIAGNOSIS — Y92009 Unspecified place in unspecified non-institutional (private) residence as the place of occurrence of the external cause: Secondary | ICD-10-CM | POA: Diagnosis not present

## 2021-08-20 DIAGNOSIS — C4361 Malignant melanoma of right upper limb, including shoulder: Secondary | ICD-10-CM | POA: Diagnosis not present

## 2021-08-20 DIAGNOSIS — Z853 Personal history of malignant neoplasm of breast: Secondary | ICD-10-CM | POA: Diagnosis not present

## 2021-08-20 DIAGNOSIS — M353 Polymyalgia rheumatica: Secondary | ICD-10-CM | POA: Diagnosis present

## 2021-08-20 DIAGNOSIS — Z20822 Contact with and (suspected) exposure to covid-19: Secondary | ICD-10-CM | POA: Diagnosis present

## 2021-08-20 DIAGNOSIS — S82892A Other fracture of left lower leg, initial encounter for closed fracture: Secondary | ICD-10-CM | POA: Diagnosis present

## 2021-08-20 DIAGNOSIS — Z91040 Latex allergy status: Secondary | ICD-10-CM | POA: Diagnosis not present

## 2021-08-20 DIAGNOSIS — Z6841 Body Mass Index (BMI) 40.0 and over, adult: Secondary | ICD-10-CM

## 2021-08-20 DIAGNOSIS — Z825 Family history of asthma and other chronic lower respiratory diseases: Secondary | ICD-10-CM | POA: Diagnosis not present

## 2021-08-20 DIAGNOSIS — Z923 Personal history of irradiation: Secondary | ICD-10-CM

## 2021-08-20 DIAGNOSIS — Z9889 Other specified postprocedural states: Secondary | ICD-10-CM

## 2021-08-20 DIAGNOSIS — W001XXA Fall from stairs and steps due to ice and snow, initial encounter: Secondary | ICD-10-CM | POA: Diagnosis present

## 2021-08-20 DIAGNOSIS — Z880 Allergy status to penicillin: Secondary | ICD-10-CM | POA: Diagnosis not present

## 2021-08-20 DIAGNOSIS — M84672A Pathological fracture in other disease, left ankle, initial encounter for fracture: Secondary | ICD-10-CM | POA: Diagnosis present

## 2021-08-20 DIAGNOSIS — C4360 Malignant melanoma of unspecified upper limb, including shoulder: Secondary | ICD-10-CM | POA: Diagnosis present

## 2021-08-20 DIAGNOSIS — K219 Gastro-esophageal reflux disease without esophagitis: Secondary | ICD-10-CM | POA: Diagnosis present

## 2021-08-20 DIAGNOSIS — Z7952 Long term (current) use of systemic steroids: Secondary | ICD-10-CM

## 2021-08-20 DIAGNOSIS — H409 Unspecified glaucoma: Secondary | ICD-10-CM | POA: Diagnosis present

## 2021-08-20 DIAGNOSIS — Z83438 Family history of other disorder of lipoprotein metabolism and other lipidemia: Secondary | ICD-10-CM

## 2021-08-20 LAB — CBC WITH DIFFERENTIAL/PLATELET
Abs Immature Granulocytes: 0.02 10*3/uL (ref 0.00–0.07)
Basophils Absolute: 0.1 10*3/uL (ref 0.0–0.1)
Basophils Relative: 1 %
Eosinophils Absolute: 0.1 10*3/uL (ref 0.0–0.5)
Eosinophils Relative: 1 %
HCT: 37.8 % (ref 36.0–46.0)
Hemoglobin: 11.8 g/dL — ABNORMAL LOW (ref 12.0–15.0)
Immature Granulocytes: 0 %
Lymphocytes Relative: 14 %
Lymphs Abs: 1.3 10*3/uL (ref 0.7–4.0)
MCH: 30.3 pg (ref 26.0–34.0)
MCHC: 31.2 g/dL (ref 30.0–36.0)
MCV: 96.9 fL (ref 80.0–100.0)
Monocytes Absolute: 0.7 10*3/uL (ref 0.1–1.0)
Monocytes Relative: 8 %
Neutro Abs: 7 10*3/uL (ref 1.7–7.7)
Neutrophils Relative %: 76 %
Platelets: 200 10*3/uL (ref 150–400)
RBC: 3.9 MIL/uL (ref 3.87–5.11)
RDW: 13.5 % (ref 11.5–15.5)
WBC: 9.2 10*3/uL (ref 4.0–10.5)
nRBC: 0 % (ref 0.0–0.2)

## 2021-08-20 LAB — BASIC METABOLIC PANEL
Anion gap: 9 (ref 5–15)
BUN: 17 mg/dL (ref 8–23)
CO2: 25 mmol/L (ref 22–32)
Calcium: 8.9 mg/dL (ref 8.9–10.3)
Chloride: 103 mmol/L (ref 98–111)
Creatinine, Ser: 0.54 mg/dL (ref 0.44–1.00)
GFR, Estimated: >60 mL/min (ref >60)
Glucose, Bld: 114 mg/dL — ABNORMAL HIGH (ref 70–99)
Potassium: 5 mmol/L (ref 3.5–5.1)
Sodium: 137 mmol/L (ref 135–145)

## 2021-08-20 LAB — RESP PANEL BY RT-PCR (FLU A&B, COVID) ARPGX2
Influenza A by PCR: NEGATIVE
Influenza B by PCR: NEGATIVE
SARS Coronavirus 2 by RT PCR: NEGATIVE

## 2021-08-20 MED ORDER — ENOXAPARIN SODIUM 40 MG/0.4ML IJ SOSY
40.0000 mg | PREFILLED_SYRINGE | INTRAMUSCULAR | Status: DC
  Administered 2021-08-21: 40 mg via SUBCUTANEOUS
  Filled 2021-08-20: qty 0.4

## 2021-08-20 MED ORDER — FENTANYL CITRATE PF 50 MCG/ML IJ SOSY
50.0000 ug | PREFILLED_SYRINGE | INTRAMUSCULAR | Status: AC | PRN
  Administered 2021-08-20 (×2): 50 ug via INTRAVENOUS
  Filled 2021-08-20 (×2): qty 1

## 2021-08-20 MED ORDER — MORPHINE SULFATE (PF) 2 MG/ML IV SOLN
0.5000 mg | INTRAVENOUS | Status: DC | PRN
  Administered 2021-08-20 – 2021-08-23 (×11): 0.5 mg via INTRAVENOUS
  Filled 2021-08-20 (×12): qty 1

## 2021-08-20 MED ORDER — SENNOSIDES-DOCUSATE SODIUM 8.6-50 MG PO TABS
1.0000 | ORAL_TABLET | Freq: Two times a day (BID) | ORAL | Status: DC
  Administered 2021-08-20 – 2021-08-26 (×9): 1 via ORAL
  Filled 2021-08-20 (×9): qty 1

## 2021-08-20 MED ORDER — OXYCODONE HCL 5 MG PO TABS
5.0000 mg | ORAL_TABLET | ORAL | Status: DC | PRN
  Administered 2021-08-20 – 2021-08-26 (×24): 10 mg via ORAL
  Filled 2021-08-20 (×24): qty 2

## 2021-08-20 MED ORDER — OXYCODONE-ACETAMINOPHEN 5-325 MG PO TABS
1.0000 | ORAL_TABLET | Freq: Once | ORAL | Status: AC
  Administered 2021-08-20: 1 via ORAL
  Filled 2021-08-20: qty 1

## 2021-08-20 NOTE — Consult Note (Signed)
Reason for Consult:Left ankle fx Referring Physician: Gerald Stabs Tegeler Time called: 7124 Time at bedside: [name] is an 76 y.o. female.  HPI: [name] was coming down the ramp at her house and slipped on some ice. She fell hard and had immediate pain in her left ankle and could not get up. She was brought to the ED where x-rays showed an ankle fx and orthopedic surgery was consulted. She lives at home alone and ambulates with a cane 2/2 recent bilateral hip replacements.  Past Medical History:  Diagnosis Date   Asymptomatic varicose veins    Cancer ([place])    breast    Diverticulosis    DM type 2 (diabetes mellitus, type 2) ([place]) 10/23/2014   Dysuria    GERD (gastroesophageal reflux disease)    Hiatal hernia    Hyperlipidemia    Malignant neoplasm of breast (female), unspecified site    Migraine without aura, without mention of intractable migraine without mention of status migrainosus    Other abnormal blood chemistry    Pain in joint, pelvic region and thigh    Shingles    in eyes   Stricture and stenosis of esophagus    Trigger finger (acquired)    Unspecified cataract    Unspecified glaucoma(365.9)     Past Surgical History:  Procedure Laterality Date   APPENDECTOMY  1973   Dr. Linzie Collin   BREAST LUMPECTOMY Right 08/29/2008   Dr. Marcello Moores [place]   ESOPHAGEAL DILATION     NASAL POLYP EXCISION  10/2015   POLYPECTOMY  11/20/14   Uterine Polyp Removed    Family History  Problem Relation Age of Onset   Hypertension Mother    Hyperlipidemia Mother    Alzheimer's disease Mother    Emphysema Father    COPD Father     Social History:  reports that she has never smoked. She has never used smokeless tobacco. She reports that she does not drink alcohol and does not use drugs.  Allergies:  Allergies  Allergen Reactions   Cymbalta [Duloxetine Hcl] Other (See Comments)    sleepiness   Gabapentin Other (See Comments)    sleepiness   Adhesive [Tape] Dermatitis    Betimol [Timolol Maleate]     Allergic to preservatives    Cosopt [Dorzolamide Hcl-Timolol Mal] Other (See Comments)    Turns eye red, increases eye pressure   Latex Other (See Comments)   Penicillins    Prednisolone Acetate Other (See Comments)    Caused increased eye pressure   Tamoxifen Other (See Comments)    Vaginal bleeding   Thimerosal     Makes pt eye red    Iodine-131 Rash    Dye from PET scan only, states has had since and did ok    Medications: I have reviewed the patient's current medications.  No results found for this or any previous visit (from the past 48 hour(s)).  DG Tibia/Fibula Left  Result Date: 08/20/2021 CLINICAL DATA:  Fall, left leg and ankle pain EXAM: LEFT TIBIA AND FIBULA - 2 VIEW COMPARISON:  None. FINDINGS: Tricompartmental marginal spurring in the knee without knee effusion. Fractures of the lateral and medial malleoli as discussed under the dedicated ankle imaging. Plantar calcaneal spur. IMPRESSION: 1. Malleolar fractures better shown on dedicated ankle imaging. 2. 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 Electronically Signed   By: Van Clines M.D.   On: 08/20/2021 11:25   DG Ankle Complete Left  Result Date: 08/20/2021 CLINICAL DATA:  Left lower leg pain.  Left ankle injury after fall. EXAM: LEFT ANKLE COMPLETE - 3+ VIEW COMPARISON:  None. FINDINGS: Relatively nondisplaced and non dislocated stage IV Weber B fracture of the left ankle with oblique lateral malleolar component, transverse medial malleolar component (with mild comminution but also includes an oblique component), and oblique posterior malleolar component. Surrounding soft tissue swelling noted. Plantar calcaneal spur. IMPRESSION: 1. Stage IV Weber B fracture of the left ankle. Electronically Signed   By: Van Clines M.D.   On: 08/20/2021 11:24    Review of Systems  HENT:  Negative for ear discharge, ear pain, hearing loss and tinnitus.   Eyes:  Negative for  photophobia and pain.  Respiratory:  Negative for cough and shortness of breath.   Cardiovascular:  Negative for chest pain.  Gastrointestinal:  Negative for abdominal pain, nausea and vomiting.  Genitourinary:  Negative for dysuria, flank pain, frequency and urgency.  Musculoskeletal:  Positive for arthralgias (Left ankle/knee). Negative for back pain, myalgias and neck pain.  Neurological:  Negative for dizziness and headaches.  Hematological:  Does not bruise/bleed easily.  Psychiatric/Behavioral:  The patient is not nervous/anxious.   Blood pressure 138/73, pulse 85, temperature 98.6 F (37 C), resp. rate 18, SpO2 94 %. Physical Exam Constitutional:      General: She is not in acute distress.    Appearance: She is well-developed. She is not diaphoretic.  HENT:     Head: Normocephalic and atraumatic.  Eyes:     General: No scleral icterus.       Right eye: No discharge.        Left eye: No discharge.     Conjunctiva/sclera: Conjunctivae normal.  Cardiovascular:     Rate and Rhythm: Normal rate and regular rhythm.  Pulmonary:     Effort: Pulmonary effort is normal. No respiratory distress.  Musculoskeletal:     Cervical back: Normal range of motion.     Comments: LLE No traumatic wounds, ecchymosis, or rash  Mod TTP ankle, mod edema  No knee effusion  Knee stable to varus/ valgus and anterior/posterior stress, TTP posterolaterally  Sens DPN, SPN, TN intact  Motor EHL, ext, flex, evers 5/5  DP 2+, PT 0, No significant pedal edema  Skin:    General: Skin is warm and dry.  Neurological:     Mental Status: She is alert.  Psychiatric:        Mood and Affect: Mood normal.        Behavior: Behavior normal.    Assessment/Plan: Left ankle fx -- Plan ORIF by Dr. Kathaleen Bury on Saturday. Please keep NPO after MN tomorrow. Multiple medical problems including polymyalgia rheumatica, GERD, melanoma, and glaucoma -- Have asked IM to admit and clear for surgery.    Lisette Abu, PA-C Orthopedic Surgery [phone] 08/20/2021, 12:19 PM

## 2021-08-20 NOTE — ED Triage Notes (Signed)
Patient BIB [facility] from home, patient slipped on porch this morning, complaint of left ankle injury. VSS. NAD. Pt received 150 mcg fentanyl via EMS.

## 2021-08-20 NOTE — ED Notes (Signed)
Left foot elevated and ice pack applied

## 2021-08-20 NOTE — ED Provider Notes (Signed)
[facility] EMERGENCY DEPARTMENT Provider Note   CSN: [id number] Arrival date & time: 08/20/21  1017     History  Chief Complaint  Patient presents with   Fall   Leg Injury    [name] is a 76 y.o. female.  The patient presents to the emergency department this morning via EMS secondary to a fall at home complaining of lower left leg pain.  The patient was leaving her home this morning when she slipped on her ramp.  She twisted her lower left leg and fell onto her right side.  She complains of no right-sided pain.  She states she did not hit her head or lose consciousness.  PMH significant for age-related osteoporosis, history of malignant neoplasm of the breast, melanoma, left and right total hip arthroplasty in 2021, polymyalgia, and melanoma of the right wrist.       Home Medications Prior to Admission medications   Medication Sig Start Date End Date Taking? Authorizing Provider  Ascorbic Acid (VITAMIN C) 1000 MG tablet Take 1,000 mg by mouth daily.    [provider]  Calcium Citrate-Vitamin D 250-200 MG-UNIT TABS Take 500 mg by mouth daily.     [provider]  Cholecalciferol (VITAMIN D-3) 5000 units TABS Take by mouth daily.    [provider]  Cyanocobalamin 5000 MCG CAPS cyanocobalamin (vitamin B-12) 5,000 mcg capsule  Take 1 capsule every day by oral route.    [provider]  diclofenac (VOLTAREN) 75 MG EC tablet Take 1 tablet by mouth 2 (two) times daily. 11/24/20   [provider]  fluorometholone (FML) 0.1 % ophthalmic suspension INSTILL ONE DROP IN RIGHT EYE DAILY 10/08/19   [provider]  HYDROcodone-acetaminophen (NORCO) 10-325 MG tablet Take 1 tablet by mouth every 6 (six) hours as needed for severe pain. 07/22/21   Wardell Honour, MD  omeprazole (PRILOSEC) 20 MG capsule Take 20 mg by mouth every other day.  08/10/19   [provider]  omeprazole (PRILOSEC) 20 MG capsule 1 capsule  30 minutes before morning meal    [provider]  omeprazole (PRILOSEC) 40 MG capsule Take 40 mg by mouth every other day.     [provider]  OVER THE COUNTER MEDICATION 1 capsule 3 (three) times daily. Balance of [employer] and Fruit.    [provider]  predniSONE (DELTASONE) 5 MG tablet Take 2 mg by mouth daily. 03/20/20   [provider]  timolol (TIMOPTIC) 0.25 % ophthalmic solution 1 drop into affected eye    [provider]  timolol (TIMOPTIC) 0.5 % ophthalmic solution Place 1 drop into the right eye 2 times daily. 10/05/19   [provider]  zinc gluconate 50 MG tablet Take by mouth.    [provider]      Allergies    Cymbalta [duloxetine hcl], Gabapentin, Adhesive [tape], Betimol [timolol maleate], Cosopt [dorzolamide hcl-timolol mal], Latex, Penicillins, Prednisolone acetate, Tamoxifen, Thimerosal, and Iodine-131    Review of Systems   Review of Systems  Constitutional: Negative.   Respiratory:  Negative for shortness of breath.   Cardiovascular:  Negative for chest pain.  Gastrointestinal:  Negative for abdominal pain.  Musculoskeletal:        Lower left leg pain  Skin:  Negative for color change and wound.  Neurological:  Negative for light-headedness.   Physical Exam Updated Vital Signs BP 140/66    Pulse 86    Temp 98.6 F (37  C)    Resp 18    SpO2 95%  Physical Exam HENT:     Head: Normocephalic.  Eyes:     Pupils: Pupils are equal, round, and reactive to light.  Cardiovascular:     Rate and Rhythm: Normal rate and regular rhythm.     Pulses: Normal pulses.     Heart sounds: Normal heart sounds.  Pulmonary:     Breath sounds: Normal breath sounds.  Abdominal:     Palpations: Abdomen is soft.  Musculoskeletal:        General: Tenderness and signs of injury present. No deformity.     Cervical back: Neck supple.     Comments: Patient able to dorsiflex, plantar flex, evert, and invert left foot   Skin:    General: Skin is dry.  Neurological:     Mental Status: She is alert.    ED Results / Procedures / Treatments   Labs (all labs ordered are listed, but only abnormal results are displayed) Labs Reviewed  BASIC METABOLIC PANEL - Abnormal; Notable for the following components:      Result Value   Glucose, Bld 114 (*)    All other components within normal limits  CBC WITH DIFFERENTIAL/PLATELET - Abnormal; Notable for the following components:   Hemoglobin 11.8 (*)    All other components within normal limits  RESP PANEL BY RT-PCR (FLU A&B, COVID) [id number]    EKG None  Radiology DG Tibia/Fibula Left  Result Date: 08/20/2021 CLINICAL DATA:  Fall, left leg and ankle pain EXAM: LEFT TIBIA AND FIBULA - 2 VIEW COMPARISON:  None. FINDINGS: Tricompartmental marginal spurring in the knee without knee effusion. Fractures of the lateral and medial malleoli as discussed under the dedicated ankle imaging. Plantar calcaneal spur. IMPRESSION: 1. Malleolar fractures better shown on dedicated ankle imaging. 2. 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 Electronically Signed   By: Van Clines M.D.   On: 08/20/2021 11:25   DG Ankle Complete Left  Result Date: 08/20/2021 CLINICAL DATA:  Left lower leg pain.  Left ankle injury after fall. EXAM: LEFT ANKLE COMPLETE - 3+ VIEW COMPARISON:  None. FINDINGS: Relatively nondisplaced and non dislocated stage IV Weber B fracture of the left ankle with oblique lateral malleolar component, transverse medial malleolar component (with mild comminution but also includes an oblique component), and oblique posterior malleolar component. Surrounding soft tissue swelling noted. Plantar calcaneal spur. IMPRESSION: 1. Stage IV Weber B fracture of the left ankle. Electronically Signed   By: Van Clines M.D.   On: 08/20/2021 11:24    Procedures .Ortho Injury Treatment  Date/Time: 08/20/2021 1:26 PM Performed by: Dorothyann Peng,  PA Authorized by: Dorothyann Peng, PA   Consent:    Consent obtained:  Verbal   Consent given by:  Patient   Risks discussed:  Nerve damage, restricted joint movement and stiffness   Alternatives discussed:  No treatmentInjury location: ankle Location details: left ankle Injury type: fracture-dislocation Fracture type: trimalleolar Pre-procedure neurovascular assessment: neurovascularly intact Pre-procedure distal perfusion: normal Pre-procedure neurological function: normal Pre-procedure range of motion: normal  Anesthesia: Local anesthesia used: no  Patient sedated: NoManipulation performed: no Immobilization: splint Splint type: short leg Splint Applied by: Sheliah Hatch Supplies used: elastic bandage and Ortho-Glass Post-procedure neurovascular assessment: post-procedure neurovascularly intact Post-procedure distal perfusion: normal Post-procedure neurological function: normal Post-procedure range of motion: normal      Medications Ordered in ED Medications  fentaNYL (SUBLIMAZE) injection 50 mcg (50 mcg Intravenous Given 08/20/21 1348)  oxyCODONE-acetaminophen (PERCOCET/ROXICET) 5-325 MG per tablet 1 tablet (1 tablet Oral Given 08/20/21 1445)    ED Course/ Medical Decision Making/ A&P                           Medical Decision Making Amount and/or Complexity of Data Reviewed Radiology: ordered.   This patient presents to the ED for concern of lower left leg pain secondary to a fall.  Possible differential diagnoses include fracture, sprain, muscle strain, and others.  Co morbidities that complicate the patient evaluation  None   Additional history obtained:   External records from outside source obtained and reviewed including family medicine notes from 01/27/2021   Lab Tests:  I Ordered, and personally interpreted labs.  The pertinent results include:  HgB 11.8   Imaging Studies ordered:  I ordered imaging studies including plain radiographs of the  ankle and the tibia-fibula on the left I independently visualized and interpreted imaging which showed trimalleolar fracture of the left ankle I agree with the radiologist interpretation     Medicines ordered and prescription drug management:  I ordered medication including fentanyl for pain Reevaluation of the patient after these medicines showed that the patient improved I have reviewed the patients home medicines and have made adjustments as needed    Consultations Obtained:  I requested consultation with Silvestre Gunner, PA, orthopedics,  and discussed lab and imaging findings as well as pertinent plan. Recommended hospitalist admission to clear for surgery with planned ORIF by Dr. Kathaleen Bury on Saturday Discussed case with Dr. Tamala Julian with [facility] who agreed to admit patient   Reevaluation:  After the interventions noted above, I reevaluated the patient and found that they have :improved    Dispostion:  After consideration of the diagnostic results and the patients response to treatment, I feel that the patent would benefit from admission to the hospitalist service at the recommendation of orthopedics in order to clear medically for ORIF Saturday.      Final Clinical Impression(s) / ED Diagnoses Final diagnoses:  Closed fracture of left ankle, initial encounter    Rx / DC Orders ED Discharge Orders     None         Dorothyann Peng, [place] 08/20/21 1457    Tegeler, Gwenyth Allegra, MD 08/21/21 1030

## 2021-08-20 NOTE — Progress Notes (Signed)
Orthopedic Tech Progress Note Patient Details:  [name] 08/26/1945 [id number]  Ortho Devices Type of Ortho Device: Stirrup splint, Short leg splint Ortho Device/Splint Location: lle Ortho Device/Splint Interventions: Ordered, Application, Adjustment   Post Interventions Patient Tolerated: Well  Edwina Barth 08/20/2021, 12:58 PM

## 2021-08-20 NOTE — H&P (Signed)
History and Physical    LADELL BEY CBJ:[id number] DOB: 1945-11-15 DOA: 08/20/2021  Referring MD/NP/PA: Benedetto Goad, PA-C PCP: Wardell Honour, MD  Patient coming from: Home(lives alone) via EMS  Chief Complaint: Fall  I have personally briefly reviewed patient's old medical records in [place]   HPI: [name] is a 76 y.o. 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  At baseline patient ambulates with use of a cane after having bilateral hip replacements recently.  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  However, while walking down her ramp she slipped on ice that was present causing her to fall.  She complained of immediate and severe pain out of her left ankle.  Denies any loss of consciousness or trauma to her head.  She does complain of pain all along the right side and just below her knee.  Patient notes that she has right eyelid lag that is either from her history of zoster and/or breast cancer as symptoms started around the same time.  En route with EMS patient received fentanyl 150 mcg IV.  ED Course: Upon admission into the emergency department patient was noted to have stable vital signs.  Labs were relatively unremarkable except for hemoglobin 11.8.  X-rays noted a stage IV Weber B fracture of the left ankle.  Left ankle was splinted.  Orthopedics was consulted and Dr. Kathaleen Bury plans to take to surgery on 1/28.  Recommended keeping the patient n.p.o. after midnight.  Review of Systems  Constitutional:  Negative for fever.  Eyes:  Negative for photophobia.  Respiratory:  Negative for cough.   Cardiovascular:  Positive for leg swelling. Negative for chest pain.  Gastrointestinal:  Negative for abdominal  pain, nausea and vomiting.  Genitourinary:  Negative for dysuria.  Musculoskeletal:  Positive for back pain, falls and joint pain.  Neurological:  Negative for loss of consciousness.  Psychiatric/Behavioral:  Negative for substance abuse.    Past Medical History:  Diagnosis Date   Asymptomatic varicose veins    Cancer ([place])    breast    Diverticulosis    DM type 2 (diabetes mellitus, type 2) ([place]) 10/23/2014   Dysuria    GERD (gastroesophageal reflux disease)    Hiatal hernia    Hyperlipidemia    Malignant neoplasm of breast (female), unspecified site    Migraine without aura, without mention of intractable migraine without mention of status migrainosus    Other abnormal blood chemistry    Pain in joint, pelvic region and thigh    Shingles    in eyes   Stricture and stenosis of esophagus    Trigger finger (acquired)    Unspecified cataract    Unspecified glaucoma(365.9)     Past Surgical History:  Procedure Laterality Date   APPENDECTOMY  1973   Dr. Linzie Collin   BREAST LUMPECTOMY Right 08/29/2008   Dr. Marcello Moores [place]   ESOPHAGEAL DILATION     NASAL POLYP EXCISION  10/2015   POLYPECTOMY  11/20/14   Uterine Polyp Removed     reports that she has never smoked. She has never used smokeless tobacco. She reports that she does not drink alcohol and does not use drugs.  Allergies  Allergen Reactions   Cymbalta [Duloxetine Hcl] Other (  See Comments)    sleepiness   Gabapentin Other (See Comments)    sleepiness   Adhesive [Tape] Dermatitis   Betimol [Timolol Maleate]     Allergic to preservatives    Cosopt [Dorzolamide Hcl-Timolol Mal] Other (See Comments)    Turns eye red, increases eye pressure   Latex Other (See Comments)   Penicillins    Prednisolone Acetate Other (See Comments)    Caused increased eye pressure   Tamoxifen Other (See Comments)    Vaginal bleeding   Thimerosal     Makes pt eye red    Iodine-131 Rash    Dye from PET scan only, states has had since and  did ok    Family History  Problem Relation Age of Onset   Hypertension Mother    Hyperlipidemia Mother    Alzheimer's disease Mother    Emphysema Father    COPD Father     Prior to Admission medications   Medication Sig Start Date End Date Taking? Authorizing Provider  Ascorbic Acid (VITAMIN C) 1000 MG tablet Take 1,000 mg by mouth daily.    [provider]  Calcium Citrate-Vitamin D 250-200 MG-UNIT TABS Take 500 mg by mouth daily.     [provider]  Cholecalciferol (VITAMIN D-3) 5000 units TABS Take by mouth daily.    [provider]  Cyanocobalamin 5000 MCG CAPS cyanocobalamin (vitamin B-12) 5,000 mcg capsule  Take 1 capsule every day by oral route.    [provider]  diclofenac (VOLTAREN) 75 MG EC tablet Take 1 tablet by mouth 2 (two) times daily. 11/24/20   [provider]  fluorometholone (FML) 0.1 % ophthalmic suspension INSTILL ONE DROP IN RIGHT EYE DAILY 10/08/19   [provider]  HYDROcodone-acetaminophen (NORCO) 10-325 MG tablet Take 1 tablet by mouth every 6 (six) hours as needed for severe pain. 07/22/21   Wardell Honour, MD  omeprazole (PRILOSEC) 20 MG capsule Take 20 mg by mouth every other day.  08/10/19   [provider]  omeprazole (PRILOSEC) 20 MG capsule 1 capsule 30 minutes before morning meal    [provider]  omeprazole (PRILOSEC) 40 MG capsule Take 40 mg by mouth every other day.     [provider]  OVER THE COUNTER MEDICATION 1 capsule 3 (three) times daily. Balance of [employer] and Fruit.    [provider]  predniSONE (DELTASONE) 5 MG tablet Take 2 mg by mouth daily. 03/20/20   [provider]  timolol (TIMOPTIC) 0.25 % ophthalmic solution 1 drop into affected eye    [provider]  timolol (TIMOPTIC) 0.5 % ophthalmic solution Place 1 drop into the right eye 2 times daily. 10/05/19   [provider]  zinc gluconate 50 MG tablet Take by  mouth.    [provider]    Physical Exam:  Constitutional: Obese elderly female currently in discomfort Vitals:   08/20/21 1022 08/20/21 1408  BP: 138/73 140/66  Pulse: 85 86  Resp: 18 18  Temp: 98.6 F (37 C)   SpO2: 94% 95%   Eyes: Right eyelid lag.  Otherwise conjunctive are clear ENMT: Mucous membranes are moist. Posterior pharynx clear of any exudate or lesions.  Neck: normal, supple, no masses, no thyromegaly Respiratory: clear to auscultation bilaterally, no wheezing, no crackles. Normal respiratory effort.   Cardiovascular: Regular rate and rhythm, no murmurs / rubs / gallops. 2+ pedal pulses. No carotid bruits.  Abdomen: no tenderness, no masses palpated. No hepatosplenomegaly.  Bowel sounds positive.  Musculoskeletal: no clubbing / cyanosis.  Right lower extremity casted skin: no rashes, lesions, ulcers. No induration Neurologic: CN 2-12 grossly intact. Strength 5/5 in all 4.  Psychiatric: Normal judgment and insight. Alert and oriented x 3. Normal mood.     Labs on Admission: I have personally reviewed following labs and imaging studies  CBC: Recent Labs  Lab 08/20/21 1351  WBC 9.2  NEUTROABS 7.0  HGB 11.8*  HCT 37.8  MCV 96.9  PLT 161   Basic Metabolic Panel: Recent Labs  Lab 08/20/21 1351  NA 137  K 5.0  CL 103  CO2 25  GLUCOSE 114*  BUN 17  CREATININE 0.54  CALCIUM 8.9   GFR: CrCl cannot be calculated (Unknown ideal weight.). Liver Function Tests: No results for input(s): AST, ALT, ALKPHOS, BILITOT, PROT, ALBUMIN in the last 168 hours. No results for input(s): LIPASE, AMYLASE in the last 168 hours. No results for input(s): AMMONIA in the last 168 hours. Coagulation Profile: No results for input(s): INR, PROTIME in the last 168 hours. Cardiac Enzymes: No results for input(s): CKTOTAL, CKMB, CKMBINDEX, TROPONINI in the last 168 hours. BNP (last 3 results) No results for input(s): PROBNP in the last 8760 hours. HbA1C: No results  for input(s): HGBA1C in the last 72 hours. CBG: No results for input(s): GLUCAP in the last 168 hours. Lipid Profile: No results for input(s): CHOL, HDL, LDLCALC, TRIG, CHOLHDL, LDLDIRECT in the last 72 hours. Thyroid Function Tests: No results for input(s): TSH, T4TOTAL, FREET4, T3FREE, THYROIDAB in the last 72 hours. Anemia Panel: No results for input(s): VITAMINB12, FOLATE, FERRITIN, TIBC, IRON, RETICCTPCT in the last 72 hours. Urine analysis:    Component Value Date/Time   COLORURINE YELLOW 03/13/2020 [place] 03/13/2020 1405   LABSPEC 1.008 03/13/2020 1405   LABSPEC 1.005 01/18/2012 1456   PHURINE 6.0 03/13/2020 1405   GLUCOSEU NEGATIVE 03/13/2020 1405   HGBUR NEGATIVE 03/13/2020 1405   BILIRUBINUR neg 05/26/2017 1337   BILIRUBINUR Negative 01/18/2012 1456   KETONESUR NEGATIVE 03/13/2020 1405   PROTEINUR NEGATIVE 03/13/2020 1405   UROBILINOGEN negative (A) 05/26/2017 1337   NITRITE NEGATIVE 03/13/2020 1405   LEUKOCYTESUR 1+ (A) 03/13/2020 1405   LEUKOCYTESUR Trace 01/18/2012 1456   Sepsis Labs: No results found for this or any previous visit (from the past 240 hour(s)).   Radiological Exams on Admission: DG Tibia/Fibula Left  Result Date: 08/20/2021 CLINICAL DATA:  Fall, left leg and ankle pain EXAM: LEFT TIBIA AND FIBULA - 2 VIEW COMPARISON:  None. FINDINGS: Tricompartmental marginal spurring in the knee without knee effusion. Fractures of the lateral and medial malleoli as discussed under the dedicated ankle imaging. Plantar calcaneal spur. IMPRESSION: 1. Malleolar fractures better shown on dedicated ankle imaging. 2. 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 Electronically Signed   By: Van Clines M.D.   On: 08/20/2021 11:25   DG Ankle Complete Left  Result Date: 08/20/2021 CLINICAL DATA:  Left lower leg pain.  Left ankle injury after fall. EXAM: LEFT ANKLE COMPLETE - 3+ VIEW COMPARISON:  None. FINDINGS: Relatively nondisplaced  and non dislocated stage IV Weber B fracture of the left ankle with oblique lateral malleolar component, transverse medial malleolar component (with mild comminution but also includes an oblique component), and oblique posterior malleolar component. Surrounding soft tissue swelling noted. Plantar calcaneal spur. IMPRESSION: 1. Stage IV Weber B fracture of the left ankle. Electronically Signed   By: Van Clines M.D.   On: 08/20/2021  11:24      Assessment/Plan Left ankle fracture secondary to Fall at home: Acute.  Patient presents after having a mechanical fall slipping on ice.  Noted to have a stage IV Weber B fracture of the left ankle.  Orthopedics Dr. Kathaleen Bury plans to take for surgery on 1/28. 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  She was attempting to go to have MRI at [facility] for surveillance this morning when she fell. 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  Currently patient on prednisone 0.5 mg daily -Continue prednisone   History of breast cancer: Diagnosed back in 2010 status post right lumpectomy and subsequent radiation.  GERD: Patient alternates between 20 and 40 mg of omeprazole every other day. -Pharmacy substitution of Protonix  DVT prophylaxis: Lovenox Code Status: Full Family Communication: Son updated at bedside Disposition Plan: To be determined Consults called: Orthopedics Admission status: Inpatient, require more than 2 midnight stay Norval Morton MD [facility]   If 7PM-7AM, please contact  night-coverage   08/20/2021, 2:49 PM

## 2021-08-21 DIAGNOSIS — S82892A Other fracture of left lower leg, initial encounter for closed fracture: Secondary | ICD-10-CM | POA: Diagnosis not present

## 2021-08-21 DIAGNOSIS — K219 Gastro-esophageal reflux disease without esophagitis: Secondary | ICD-10-CM | POA: Diagnosis not present

## 2021-08-21 DIAGNOSIS — W19XXXA Unspecified fall, initial encounter: Secondary | ICD-10-CM | POA: Diagnosis not present

## 2021-08-21 DIAGNOSIS — Z853 Personal history of malignant neoplasm of breast: Secondary | ICD-10-CM | POA: Diagnosis not present

## 2021-08-21 LAB — CBC
HCT: 39.5 % (ref 36.0–46.0)
Hemoglobin: 12.9 g/dL (ref 12.0–15.0)
MCH: 30.9 pg (ref 26.0–34.0)
MCHC: 32.7 g/dL (ref 30.0–36.0)
MCV: 94.7 fL (ref 80.0–100.0)
Platelets: 226 10*3/uL (ref 150–400)
RBC: 4.17 MIL/uL (ref 3.87–5.11)
RDW: 13.5 % (ref 11.5–15.5)
WBC: 11 10*3/uL — ABNORMAL HIGH (ref 4.0–10.5)
nRBC: 0 % (ref 0.0–0.2)

## 2021-08-21 LAB — BASIC METABOLIC PANEL
Anion gap: 11 (ref 5–15)
BUN: 12 mg/dL (ref 8–23)
CO2: 26 mmol/L (ref 22–32)
Calcium: 9.7 mg/dL (ref 8.9–10.3)
Chloride: 101 mmol/L (ref 98–111)
Creatinine, Ser: 0.59 mg/dL (ref 0.44–1.00)
GFR, Estimated: >60 mL/min (ref >60)
Glucose, Bld: 119 mg/dL — ABNORMAL HIGH (ref 70–99)
Potassium: 4.1 mmol/L (ref 3.5–5.1)
Sodium: 138 mmol/L (ref 135–145)

## 2021-08-21 LAB — SURGICAL PCR SCREEN
MRSA, PCR: NEGATIVE
Staphylococcus aureus: POSITIVE — AB

## 2021-08-21 MED ORDER — NON FORMULARY: Freq: Every day | Status: DC

## 2021-08-21 MED ORDER — VITAMIN B-12 1000 MCG PO TABS
5000.0000 ug | ORAL_TABLET | Freq: Every day | ORAL | Status: DC
  Administered 2021-08-23 – 2021-08-26 (×4): 5000 ug via ORAL
  Filled 2021-08-21 (×4): qty 5

## 2021-08-21 MED ORDER — DICLOFENAC SODIUM 75 MG PO TBEC
75.0000 mg | DELAYED_RELEASE_TABLET | Freq: Two times a day (BID) | ORAL | Status: DC | PRN
  Filled 2021-08-21: qty 1

## 2021-08-21 MED ORDER — OYSTER SHELL CALCIUM/D3 500-5 MG-MCG PO TABS
1.0000 | ORAL_TABLET | Freq: Every day | ORAL | Status: DC
  Administered 2021-08-22 – 2021-08-26 (×6): 1 via ORAL
  Filled 2021-08-21 (×5): qty 1

## 2021-08-21 MED ORDER — PANTOPRAZOLE SODIUM 40 MG PO TBEC
40.0000 mg | DELAYED_RELEASE_TABLET | Freq: Every day | ORAL | Status: DC
  Administered 2021-08-21: 40 mg via ORAL
  Filled 2021-08-21: qty 1

## 2021-08-21 MED ORDER — OMEPRAZOLE 20 MG PO CPDR: 40.0000 mg | DELAYED_RELEASE_CAPSULE | ORAL | Status: DC

## 2021-08-21 MED ORDER — OMEPRAZOLE 20 MG PO CPDR
20.0000 mg | DELAYED_RELEASE_CAPSULE | ORAL | Status: DC
  Filled 2021-08-21: qty 1

## 2021-08-21 MED ORDER — MUPIROCIN 2 % EX OINT
1.0000 "application " | TOPICAL_OINTMENT | Freq: Two times a day (BID) | CUTANEOUS | Status: AC
  Administered 2021-08-21 – 2021-08-26 (×10): 1 via NASAL
  Filled 2021-08-21 (×2): qty 22

## 2021-08-21 MED ORDER — CHLORHEXIDINE GLUCONATE CLOTH 2 % EX PADS
6.0000 | MEDICATED_PAD | Freq: Every day | CUTANEOUS | Status: DC
  Administered 2021-08-22 – 2021-08-26 (×3): 6 via TOPICAL

## 2021-08-21 MED ORDER — NON FORMULARY: 6.0000 | Status: DC

## 2021-08-21 MED ORDER — VITAMIN D 25 MCG (1000 UNIT) PO TABS
5000.0000 [IU] | ORAL_TABLET | Freq: Every day | ORAL | Status: DC
  Administered 2021-08-23 – 2021-08-26 (×4): 5000 [IU] via ORAL
  Filled 2021-08-21 (×4): qty 5

## 2021-08-21 NOTE — Progress Notes (Signed)
Pharmacy Tech brought back patient's home meds.  This nurse handed bag of home meds back to the patient.

## 2021-08-21 NOTE — Progress Notes (Signed)
PROGRESS NOTE    [name]  XVQ:[id number] DOB: 1946-03-01 DOA: 08/20/2021 PCP: Wardell Honour, MD   Brief Narrative:  [name] is a 76 y.o. 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 Orthopedics consulted.  Assessment & Plan:  Left ankle fracture secondary to mechanical slip/fall at home: Acute.   -Orthopedics Dr. Kathaleen Bury with plans to take for surgery on 1/28. -Oxycodone/morphine as needed for moderate to severe pain per orthopedics -Increased risk for ongoing fracture given chronic steroid use and possible underlying secondary osteoporosis - may benefit from bisphosphonate therapy - has been on previously but discontinued last year.   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   History of breast cancer: Diagnosed back in 2010 status post right lumpectomy and subsequent radiation.   GERD: Patient alternates between 20 and 40 mg of omeprazole every other day. -Pharmacy substitution of Protonix   DVT prophylaxis: Lovenox Code Status: Full Family Communication: None available  Status is: Inpatient  Dispo: The patient is from: Home              Anticipated d/c is to: To be determined              Anticipated d/c date is: 48 to 72 hours              Patient currently not medically stable for  discharge  Consultants:  Orthopedic surgery  Procedures:  Left ankle fracture repair per orthopedic surgery  Antimicrobials:  None indicated  Subjective: No acute issues or events overnight pain currently well controlled denies nausea vomiting diarrhea constipation headache fevers chills or chest pain  Objective: Vitals:   08/20/21 1916 08/20/21 2206 08/21/21 0426 08/21/21 0736  BP: 136/74 (!) 158/86 (!) 146/85 (!) 144/73  Pulse: 82 99 (!) 104 92  Resp: 18 20 20 17   Temp:  98.3 F (36.8 C) 98 F (36.7 C) 97.9 F (36.6 C)  TempSrc:  Oral Oral Oral  SpO2: 96% 97% 96% 94%    Intake/Output Summary (Last 24 hours) at 08/21/2021 0740 Last data filed at 08/20/2021 2150 Gross per 24 hour  Intake --  Output 800 ml  Net -800 ml   There were no vitals filed for this visit.  Examination:  General:  Pleasantly resting in bed, No acute distress. HEENT:  Normocephalic atraumatic.  Sclerae nonicteric, noninjected.  Extraocular movements intact bilaterally. Neck:  Without mass or deformity.  Trachea is midline. Lungs:  Clear to auscultate bilaterally without rhonchi, wheeze, or rales. Heart:  Regular rate and rhythm.  Without murmurs, rubs, or gallops. Abdomen:  Soft, nontender, nondistended.  Without guarding or rebound. Extremities: Left foot bandage clean dry intact  Data Reviewed: I have personally reviewed following labs and imaging studies  CBC: Recent Labs  Lab 08/20/21 1351 08/21/21 0143  WBC 9.2 11.0*  NEUTROABS 7.0  --  HGB 11.8* 12.9  HCT 37.8 39.5  MCV 96.9 94.7  PLT 200 016   Basic Metabolic Panel: Recent Labs  Lab 08/20/21 1351 08/21/21 0143  NA 137 138  K 5.0 4.1  CL 103 101  CO2 25 26  GLUCOSE 114* 119*  BUN 17 12  CREATININE 0.54 0.59  CALCIUM 8.9 9.7   GFR: CrCl cannot be calculated (Unknown ideal weight.). Liver Function Tests: No results for input(s): AST, ALT, ALKPHOS, BILITOT, PROT, ALBUMIN in the last 168 hours. No results for  input(s): LIPASE, AMYLASE in the last 168 hours. No results for input(s): AMMONIA in the last 168 hours. Coagulation Profile: No results for input(s): INR, PROTIME in the last 168 hours. Cardiac Enzymes: No results for input(s): CKTOTAL, CKMB, CKMBINDEX, TROPONINI in the last 168 hours. BNP (last 3 results) No results for input(s): PROBNP in the last 8760 hours. HbA1C: No results for input(s): HGBA1C in the last 72 hours. CBG: No results for input(s): GLUCAP in the last 168 hours. Lipid Profile: No results for input(s): CHOL, HDL, LDLCALC, TRIG, CHOLHDL, LDLDIRECT in the last 72 hours. Thyroid Function Tests: No results for input(s): TSH, T4TOTAL, FREET4, T3FREE, THYROIDAB in the last 72 hours. Anemia Panel: No results for input(s): VITAMINB12, FOLATE, FERRITIN, TIBC, IRON, RETICCTPCT in the last 72 hours. Sepsis Labs: No results for input(s): PROCALCITON, LATICACIDVEN in the last 168 hours.  Recent Results (from the past 240 hour(s))  Resp Panel by RT-PCR (Flu A&B, Covid) Nasopharyngeal Swab     Status: None   Collection Time: 08/20/21  2:02 PM   Specimen: Nasopharyngeal Swab; Nasopharyngeal(NP) swabs in vial transport medium  Result Value Ref Range Status   SARS Coronavirus 2 by RT PCR NEGATIVE NEGATIVE Final    Comment: (NOTE) SARS-CoV-2 target nucleic acids are NOT DETECTED.  The SARS-CoV-2 RNA is generally detectable in upper respiratory specimens during the acute phase of infection. The lowest concentration of SARS-CoV-2 viral copies this assay can detect is 138 copies/mL. A negative result does not preclude SARS-Cov-2 infection and should not be used as the sole basis for treatment or other patient management decisions. A negative result may occur with  improper specimen collection/handling, submission of specimen other than nasopharyngeal swab, presence of viral mutation(s) within the areas targeted by this assay, and inadequate number of viral copies(<138 copies/mL). A  negative result must be combined with clinical observations, patient history, and epidemiological information. The expected result is Negative.  Fact Sheet for Patients:  EntrepreneurPulse.com.au  Fact Sheet for Healthcare Providers:  IncredibleEmployment.be  This test is no t yet approved or cleared by the [place] FDA and  has been authorized for detection and/or diagnosis of SARS-CoV-2 by FDA under an Emergency Use Authorization (EUA). This EUA will remain  in effect (meaning this test can be used) for the duration of the COVID-19 declaration under Section 564(b)(1) of the Act, 21 U.S.C.section 360bbb-3(b)(1), unless the authorization is terminated  or revoked sooner.       Influenza A by PCR NEGATIVE NEGATIVE Final   Influenza B by PCR NEGATIVE NEGATIVE Final    Comment: (NOTE) The Xpert Xpress SARS-CoV-2/FLU/RSV plus assay is intended as an aid in the diagnosis of influenza from Nasopharyngeal swab specimens and should not be used as a sole basis for treatment. Nasal washings and aspirates are unacceptable for Xpert Xpress SARS-CoV-2/FLU/RSV testing.  Fact Sheet for Patients: EntrepreneurPulse.com.au  Fact Sheet for Healthcare Providers: IncredibleEmployment.be  This test is not yet approved or cleared by the  [place] Architectural technologist and has been authorized for detection and/or diagnosis of SARS-CoV-2 by FDA under an Print production planner (EUA). This EUA will remain in effect (meaning this test can be used) for the duration of the COVID-19 declaration under Section 564(b)(1) of the Act, 21 U.S.C. section 360bbb-3(b)(1), unless the authorization is terminated or revoked.  Performed at [facility], [place] [address] [place], [place] [address]          Radiology Studies: DG Tibia/Fibula Left  Result Date: 08/20/2021 CLINICAL DATA:  Fall, left leg and ankle pain EXAM: LEFT TIBIA AND  FIBULA - 2 VIEW COMPARISON:  None. FINDINGS: Tricompartmental marginal spurring in the knee without knee effusion. Fractures of the lateral and medial malleoli as discussed under the dedicated ankle imaging. Plantar calcaneal spur. IMPRESSION: 1. Malleolar fractures better shown on dedicated ankle imaging. 2. 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 Electronically Signed   By: Van Clines M.D.   On: 08/20/2021 11:25   DG Ankle Complete Left  Result Date: 08/20/2021 CLINICAL DATA:  Left lower leg pain.  Left ankle injury after fall. EXAM: LEFT ANKLE COMPLETE - 3+ VIEW COMPARISON:  None. FINDINGS: Relatively nondisplaced and non dislocated stage IV Weber B fracture of the left ankle with oblique lateral malleolar component, transverse medial malleolar component (with mild comminution but also includes an oblique component), and oblique posterior malleolar component. Surrounding soft tissue swelling noted. Plantar calcaneal spur. IMPRESSION: 1. Stage IV Weber B fracture of the left ankle. Electronically Signed   By: Van Clines M.D.   On: 08/20/2021 11:24   CT ANKLE LEFT WO CONTRAST  Result Date: 08/20/2021 CLINICAL DATA:  Ankle fracture, preoperative planning. EXAM: CT OF THE LEFT ANKLE WITHOUT CONTRAST TECHNIQUE: Multidetector CT imaging of the left ankle was performed according to the standard protocol. Multiplanar CT image reconstructions were also generated. RADIATION DOSE REDUCTION: This exam was performed according to the departmental dose-optimization program which includes automated exposure control, adjustment of the mA and/or kV according to patient size and/or use of iterative reconstruction technique. COMPARISON:  Radiograph earlier today. FINDINGS: Bones/Joint/Cartilage Comminuted and mildly displaced medial malleolar fracture with dominant fracture plane transverse at the level of the ankle mortise. There is also involvement of the anteromedial aspect of the tibial  metaphysis. Mildly displaced and comminuted posterior malleolar fracture. Articular surface displacement is 2-3 mm. Oblique displaced distal fibular fracture at and just above the level of the ankle mortise. There are multiple small fracture fragments in the lateral clear space and in the lateral ankle joint. Slight lateral displacement of the talus. Intact talar dome. Plantar calcaneal spur. Ligaments Suboptimally assessed by CT. Muscles and Tendons No evidence of tendon entrapment.  Intact Achilles tendon. Soft tissues Diffuse soft tissue edema. IMPRESSION: 1. Mildly displaced trimalleolar fracture. 2. Slight lateral displacement of the talus. Multiple small fracture fragments in the lateral ankle joint. Electronically Signed   By: Keith Rake M.D.   On: 08/20/2021 23:10    Scheduled Meds:  enoxaparin (LOVENOX) injection  40 mg Subcutaneous Q24H   senna-docusate  1 tablet Oral BID    LOS: 1 day   Time spent: 1min  Reatha Sur C Alexus Michael, DO [facility]  If 7PM-7AM, please contact night-coverage [email or web address]  08/21/2021, 7:40 AM

## 2021-08-21 NOTE — Progress Notes (Signed)
Subjective:     Patient reports pain as mild to moderate. She is elevating left lower extremity and looking forward to getting surgery done tomorrow.  Objective:   VITALS:  Temp:  [97.9 F (36.6 C)-99.2 F (37.3 C)] 99.2 F (37.3 C) (01/27 2047) Pulse Rate:  [91-104] 100 (01/27 2047) Resp:  [16-20] 16 (01/27 1558) BP: (129-158)/(61-86) 150/61 (01/27 2047) SpO2:  [92 %-97 %] 94 % (01/27 2047)  Gen: AAOx3, NAD  Left lower extremity: Short leg splint in place Wiggles toes SILT over toes CR<2s    LABS Recent Labs    08/20/21 1351 08/21/21 0143  HGB 11.8* 12.9  WBC 9.2 11.0*  PLT 200 226   Recent Labs    08/20/21 1351 08/21/21 0143  NA 137 138  K 5.0 4.1  CL 103 101  CO2 25 26  BUN 17 12  CREATININE 0.54 0.59  GLUCOSE 114* 119*   No results for input(s): LABPT, INR in the last 72 hours.   Assessment/Plan:  13 yr F admitted for left trimalleolar ankle fracture with PMH notable for PMR on chronic steroids, GERD, melanoma s/p immunotherapy (2015), breast CA s/p lumpectomy & radiation  -OR on 08/22/21 for left ankle ORIF, possible syndesmosis fixation and possible external fixation with delayed definitive fixation of this ankle -NPO from [place] on 08/22/21 at 0000h -hold VTE chemoppx for 24 hr prior -pt cleared by primary team for surgery -NWB LLE, max elevation -PT/OT/CM consults  The risks and benefits were presented and reviewed. The risks due to hardware failure/irritation, new/persistent infection, stiffness, nerve/vessel/tendon injury, nonunion/malunion, wound healing issues, development of arthritis, failure of this surgery, possibility of external fixation with delayed definitive surgery, need for further surgery, thromboembolic events, anesthesia/medical complications, amputation, death among others were discussed. The patient acknowledged the explanation, agreed to proceed with the plan.   Armond Hang 08/21/2021, 9:48 PM

## 2021-08-21 NOTE — Anesthesia Preprocedure Evaluation (Addendum)
Anesthesia Evaluation  Patient identified by MRN, date of birth, ID band Patient awake    Reviewed: Allergy & Precautions, NPO status , Patient's Chart, lab work & pertinent test results  Airway Mallampati: II  TM Distance: >3 FB Neck ROM: Full    Dental  (+) Dental Advisory Given, Edentulous Upper, Missing, Chipped   Pulmonary neg pulmonary ROS,    Pulmonary exam normal breath sounds clear to auscultation       Cardiovascular negative cardio ROS   Rhythm:Regular Rate:Tachycardia     Neuro/Psych  Headaches,    GI/Hepatic Neg liver ROS, hiatal hernia, PUD, GERD  ,  Endo/Other  diabetes  Renal/GU negative Renal ROS     Musculoskeletal  (+) Arthritis ,   Abdominal   Peds  Hematology  (+) Blood dyscrasia, anemia ,   Anesthesia Other Findings   Reproductive/Obstetrics                           Anesthesia Physical Anesthesia Plan  ASA: 2  Anesthesia Plan: General   Post-op Pain Management: Minimal or no pain anticipated, Tylenol PO (pre-op) and Regional block   Induction: Intravenous  PONV Risk Score and Plan: 3 and Treatment may vary due to age or medical condition, Ondansetron, Dexamethasone and Midazolam  Airway Management Planned: LMA  Additional Equipment:   Intra-op Plan:   Post-operative Plan: Extubation in OR  Informed Consent: I have reviewed the patients History and Physical, chart, labs and discussed the procedure including the risks, benefits and alternatives for the proposed anesthesia with the patient or authorized representative who has indicated his/her understanding and acceptance.     Dental advisory given  Plan Discussed with: CRNA  Anesthesia Plan Comments:        Anesthesia Quick Evaluation

## 2021-08-21 NOTE — Progress Notes (Signed)
MD, patient is requesting her home meds to restart. She would like her son to bring her meds from home and use home supply instead of being billed again while in hospital.  MD, patient has stated that the Oxy IR and the Morphine iv is not helping her pain.

## 2021-08-22 ENCOUNTER — Inpatient Hospital Stay (HOSPITAL_COMMUNITY): Payer: Medicare Other

## 2021-08-22 ENCOUNTER — Encounter (HOSPITAL_COMMUNITY): Payer: Self-pay | Admitting: Internal Medicine

## 2021-08-22 ENCOUNTER — Inpatient Hospital Stay (HOSPITAL_COMMUNITY): Payer: Medicare Other | Admitting: Anesthesiology

## 2021-08-22 ENCOUNTER — Encounter (HOSPITAL_COMMUNITY)
Admission: EM | Disposition: A | Discharge status: Skilled Nursing Facility | Payer: Self-pay | Source: Home / Self Care | Attending: Internal Medicine

## 2021-08-22 ENCOUNTER — Ambulatory Visit (HOSPITAL_COMMUNITY): Admission: RE | Admit: 2021-08-22 | Payer: Medicare Other | Source: Home / Self Care | Admitting: Orthopaedic Surgery

## 2021-08-22 ENCOUNTER — Other Ambulatory Visit: Payer: Self-pay

## 2021-08-22 DIAGNOSIS — S82892A Other fracture of left lower leg, initial encounter for closed fracture: Secondary | ICD-10-CM | POA: Diagnosis not present

## 2021-08-22 HISTORY — PX: ORIF ANKLE FRACTURE: SHX5408

## 2021-08-22 SURGERY — OPEN REDUCTION INTERNAL FIXATION (ORIF) ANKLE FRACTURE
Anesthesia: General | Site: Ankle | Laterality: Left

## 2021-08-22 MED ORDER — ACETAMINOPHEN 500 MG PO TABS
ORAL_TABLET | ORAL | Status: AC
  Administered 2021-08-22: 1000 mg via ORAL
  Filled 2021-08-22: qty 2

## 2021-08-22 MED ORDER — CIPROFLOXACIN IN D5W 400 MG/200ML IV SOLN
400.0000 mg | Freq: Once | INTRAVENOUS | Status: AC
  Administered 2021-08-22: 400 mg via INTRAVENOUS
  Filled 2021-08-22 (×2): qty 200

## 2021-08-22 MED ORDER — DEXAMETHASONE SODIUM PHOSPHATE 4 MG/ML IJ SOLN
INTRAMUSCULAR | Status: DC | PRN
  Administered 2021-08-22: 2 mg via PERINEURAL
  Administered 2021-08-22: 3 mg via PERINEURAL

## 2021-08-22 MED ORDER — ROPIVACAINE HCL 5 MG/ML IJ SOLN
INTRAMUSCULAR | Status: DC | PRN
  Administered 2021-08-22: 20 mL via PERINEURAL
  Administered 2021-08-22: 30 mL via PERINEURAL

## 2021-08-22 MED ORDER — DICLOFENAC SODIUM 1 % EX GEL
2.0000 g | Freq: Four times a day (QID) | CUTANEOUS | Status: DC | PRN
  Administered 2021-08-25 – 2021-08-26 (×2): 2 g via TOPICAL
  Filled 2021-08-22: qty 100

## 2021-08-22 MED ORDER — CIPROFLOXACIN IN D5W 200 MG/100ML IV SOLN
200.0000 mg | Freq: Two times a day (BID) | INTRAVENOUS | Status: AC
  Administered 2021-08-22 – 2021-08-23 (×2): 200 mg via INTRAVENOUS
  Filled 2021-08-22 (×2): qty 100

## 2021-08-22 MED ORDER — GLYCOPYRROLATE PF 0.2 MG/ML IJ SOSY
PREFILLED_SYRINGE | INTRAMUSCULAR | Status: AC
  Filled 2021-08-22: qty 1

## 2021-08-22 MED ORDER — PHENYLEPHRINE 40 MCG/ML (10ML) SYRINGE FOR IV PUSH (FOR BLOOD PRESSURE SUPPORT)
PREFILLED_SYRINGE | INTRAVENOUS | Status: AC
  Filled 2021-08-22: qty 20

## 2021-08-22 MED ORDER — MEPERIDINE HCL 25 MG/ML IJ SOLN: 6.2500 mg | INTRAMUSCULAR | Status: DC | PRN

## 2021-08-22 MED ORDER — VANCOMYCIN HCL IN DEXTROSE 1-5 GM/200ML-% IV SOLN
1000.0000 mg | Freq: Once | INTRAVENOUS | Status: AC
  Administered 2021-08-22: 1000 mg via INTRAVENOUS
  Filled 2021-08-22 (×2): qty 200

## 2021-08-22 MED ORDER — CHLORHEXIDINE GLUCONATE 0.12 % MT SOLN
15.0000 mL | Freq: Once | OROMUCOSAL | Status: AC
  Administered 2021-08-22: 15 mL via OROMUCOSAL
  Filled 2021-08-22: qty 15

## 2021-08-22 MED ORDER — SCOPOLAMINE 1 MG/3DAYS TD PT72
1.0000 | MEDICATED_PATCH | TRANSDERMAL | Status: DC
  Filled 2021-08-22: qty 1

## 2021-08-22 MED ORDER — ONDANSETRON HCL 4 MG/2ML IJ SOLN
INTRAMUSCULAR | Status: AC
  Filled 2021-08-22: qty 2

## 2021-08-22 MED ORDER — PROPOFOL 10 MG/ML IV BOLUS
INTRAVENOUS | Status: AC
  Filled 2021-08-22: qty 20

## 2021-08-22 MED ORDER — DEXAMETHASONE SODIUM PHOSPHATE 10 MG/ML IJ SOLN
INTRAMUSCULAR | Status: AC
  Filled 2021-08-22: qty 1

## 2021-08-22 MED ORDER — ENOXAPARIN SODIUM 40 MG/0.4ML IJ SOSY
40.0000 mg | PREFILLED_SYRINGE | INTRAMUSCULAR | Status: DC
  Administered 2021-08-23 – 2021-08-26 (×4): 40 mg via SUBCUTANEOUS
  Filled 2021-08-22 (×4): qty 0.4

## 2021-08-22 MED ORDER — SCOPOLAMINE 1 MG/3DAYS TD PT72
MEDICATED_PATCH | TRANSDERMAL | Status: AC
  Administered 2021-08-22: 1.5 mg via TRANSDERMAL
  Filled 2021-08-22: qty 1

## 2021-08-22 MED ORDER — CLONIDINE HCL (ANALGESIA) 100 MCG/ML EP SOLN
EPIDURAL | Status: DC | PRN
  Administered 2021-08-22: 30 ug
  Administered 2021-08-22: 50 ug

## 2021-08-22 MED ORDER — HYDROMORPHONE HCL 1 MG/ML IJ SOLN: 0.2500 mg | INTRAMUSCULAR | Status: DC | PRN

## 2021-08-22 MED ORDER — PROPOFOL 10 MG/ML IV BOLUS
INTRAVENOUS | Status: DC | PRN
  Administered 2021-08-22: 180 mg via INTRAVENOUS

## 2021-08-22 MED ORDER — PANTOPRAZOLE SODIUM 40 MG PO TBEC
40.0000 mg | DELAYED_RELEASE_TABLET | Freq: Every day | ORAL | Status: DC
  Administered 2021-08-22 – 2021-08-26 (×5): 40 mg via ORAL
  Filled 2021-08-22 (×6): qty 1

## 2021-08-22 MED ORDER — BUPIVACAINE LIPOSOME 1.3 % IJ SUSP
INTRAMUSCULAR | Status: AC
  Filled 2021-08-22: qty 20

## 2021-08-22 MED ORDER — FENTANYL CITRATE (PF) 250 MCG/5ML IJ SOLN
INTRAMUSCULAR | Status: AC
  Filled 2021-08-22: qty 5

## 2021-08-22 MED ORDER — VANCOMYCIN HCL 1000 MG IV SOLR
INTRAVENOUS | Status: AC
  Filled 2021-08-22: qty 20

## 2021-08-22 MED ORDER — LACTATED RINGERS IV SOLN: INTRAVENOUS | Status: DC

## 2021-08-22 MED ORDER — DROPERIDOL 2.5 MG/ML IJ SOLN: 0.6250 mg | Freq: Once | INTRAMUSCULAR | Status: DC | PRN

## 2021-08-22 MED ORDER — PREDNISONE 1 MG PO TABS
0.5000 mg | ORAL_TABLET | Freq: Every day | ORAL | Status: DC
  Filled 2021-08-22 (×3): qty 0.5

## 2021-08-22 MED ORDER — VALACYCLOVIR HCL 500 MG PO TABS
1000.0000 mg | ORAL_TABLET | ORAL | Status: DC
  Administered 2021-08-26: 1000 mg via ORAL
  Filled 2021-08-22 (×3): qty 2

## 2021-08-22 MED ORDER — BUPIVACAINE HCL (PF) 0.5 % IJ SOLN
INTRAMUSCULAR | Status: AC
  Filled 2021-08-22: qty 30

## 2021-08-22 MED ORDER — DEXAMETHASONE SODIUM PHOSPHATE 10 MG/ML IJ SOLN
INTRAMUSCULAR | Status: DC | PRN
  Administered 2021-08-22: 4 mg via INTRAVENOUS

## 2021-08-22 MED ORDER — ONDANSETRON HCL 4 MG/2ML IJ SOLN
INTRAMUSCULAR | Status: DC | PRN
  Administered 2021-08-22: 4 mg via INTRAVENOUS

## 2021-08-22 MED ORDER — ROPIVACAINE HCL 5 MG/ML IJ SOLN: INTRAMUSCULAR | Status: DC | PRN

## 2021-08-22 MED ORDER — FLUOROMETHOLONE 0.1 % OP SUSP
1.0000 [drp] | Freq: Every morning | OPHTHALMIC | Status: DC
  Administered 2021-08-23 – 2021-08-26 (×4): 1 [drp] via OPHTHALMIC
  Filled 2021-08-22: qty 5

## 2021-08-22 MED ORDER — CHLORHEXIDINE GLUCONATE 4 % EX LIQD
60.0000 mL | Freq: Once | CUTANEOUS | Status: DC
  Filled 2021-08-22: qty 60

## 2021-08-22 MED ORDER — SODIUM CHLORIDE 0.9 % IR SOLN
Status: DC | PRN
  Administered 2021-08-22: 1000 mL

## 2021-08-22 MED ORDER — FENTANYL CITRATE (PF) 250 MCG/5ML IJ SOLN
INTRAMUSCULAR | Status: DC | PRN
Start: 2021-08-22 — End: 2021-08-22
  Administered 2021-08-22 (×2): 50 ug via INTRAVENOUS
  Administered 2021-08-22 (×5): 25 ug via INTRAVENOUS

## 2021-08-22 MED ORDER — ACETAMINOPHEN 500 MG PO TABS: 1000.0000 mg | ORAL_TABLET | Freq: Once | ORAL | Status: AC

## 2021-08-22 MED ORDER — PHENYLEPHRINE 40 MCG/ML (10ML) SYRINGE FOR IV PUSH (FOR BLOOD PRESSURE SUPPORT)
PREFILLED_SYRINGE | INTRAVENOUS | Status: DC | PRN
  Administered 2021-08-22: 40 ug via INTRAVENOUS
  Administered 2021-08-22: 80 ug via INTRAVENOUS
  Administered 2021-08-22: 40 ug via INTRAVENOUS
  Administered 2021-08-22: 80 ug via INTRAVENOUS
  Administered 2021-08-22 (×2): 40 ug via INTRAVENOUS
  Administered 2021-08-22: 120 ug via INTRAVENOUS
  Administered 2021-08-22 (×2): 80 ug via INTRAVENOUS

## 2021-08-22 MED ORDER — PHENYLEPHRINE HCL-NACL 20-0.9 MG/250ML-% IV SOLN
INTRAVENOUS | Status: DC | PRN
Start: 2021-08-22 — End: 2021-08-22
  Administered 2021-08-22: 50 ug/min via INTRAVENOUS

## 2021-08-22 MED ORDER — TIMOLOL MALEATE 0.5 % OP SOLN
1.0000 [drp] | Freq: Every day | OPHTHALMIC | Status: DC
  Administered 2021-08-23 – 2021-08-26 (×5): 1 [drp] via OPHTHALMIC
  Filled 2021-08-22: qty 5

## 2021-08-22 MED ORDER — VANCOMYCIN HCL 1000 MG IV SOLR
INTRAVENOUS | Status: DC | PRN
  Administered 2021-08-22: 1000 mg via TOPICAL

## 2021-08-22 SURGICAL SUPPLY — 79 items
ANCHOR SUT KEITH ABD SZ2 STR (SUTURE) ×1 IMPLANT
APL PRP STRL LF DISP 70% ISPRP (MISCELLANEOUS) ×2
BANDAGE ESMARK 6X9 LF (GAUZE/BANDAGES/DRESSINGS) IMPLANT
BIT DRILL 2.4X140 LONG SOLID (BIT) ×1 IMPLANT
BIT DRILL 2.5X2.75 QC CALB (BIT) ×1 IMPLANT
BIT DRILL 3.5X5.5 QC CALB (BIT) ×1 IMPLANT
BLADE 15 SAFETY STRL DISP (BLADE) ×5 IMPLANT
BLADE LONG MED 31X9 (MISCELLANEOUS) ×2 IMPLANT
BNDG CMPR 9X6 STRL LF SNTH (GAUZE/BANDAGES/DRESSINGS) ×1
BNDG COHESIVE 4X5 TAN ST LF (GAUZE/BANDAGES/DRESSINGS) ×2 IMPLANT
BNDG COHESIVE 6X5 TAN NS LF (GAUZE/BANDAGES/DRESSINGS) ×1 IMPLANT
BNDG COHESIVE 6X5 TAN ST LF (GAUZE/BANDAGES/DRESSINGS) ×1 IMPLANT
BNDG ELASTIC 4X5.8 VLCR STR LF (GAUZE/BANDAGES/DRESSINGS) ×2 IMPLANT
BNDG ELASTIC 6X5.8 VLCR STR LF (GAUZE/BANDAGES/DRESSINGS) ×2 IMPLANT
BNDG ESMARK 6X9 LF (GAUZE/BANDAGES/DRESSINGS) ×2
BNDG GAUZE ELAST 4 BULKY (GAUZE/BANDAGES/DRESSINGS) ×2 IMPLANT
CHLORAPREP W/TINT 26 (MISCELLANEOUS) ×4 IMPLANT
COVER MAYO STAND STRL (DRAPES) ×1 IMPLANT
COVER SURGICAL LIGHT HANDLE (MISCELLANEOUS) ×2 IMPLANT
CUFF TOURN SGL QUICK 34 (TOURNIQUET CUFF) ×2
CUFF TRNQT CYL 34X4.125X (TOURNIQUET CUFF) ×1 IMPLANT
DECANTER SPIKE VIAL GLASS SM (MISCELLANEOUS) IMPLANT
DRAPE C-ARM 42X120 X-RAY (DRAPES) ×1 IMPLANT
DRAPE C-ARMOR (DRAPES) ×2 IMPLANT
DRAPE EXTREMITY T 121X128X90 (DISPOSABLE) ×2 IMPLANT
DRAPE ORTHO SPLIT 77X108 STRL (DRAPES) ×4
DRAPE SURG ORHT 6 SPLT 77X108 (DRAPES) ×2 IMPLANT
DRAPE U-SHAPE 47X51 STRL (DRAPES) ×3 IMPLANT
DRSG ADAPTIC 3X8 NADH LF (GAUZE/BANDAGES/DRESSINGS) ×1 IMPLANT
DRSG PAD ABDOMINAL 8X10 ST (GAUZE/BANDAGES/DRESSINGS) ×10 IMPLANT
ELECT REM PT RETURN 15FT ADLT (MISCELLANEOUS) ×2 IMPLANT
FIXATION ZIPTIGHT ANKLE SNDSMS (Ankle) IMPLANT
GAUZE SPONGE 4X4 12PLY STRL (GAUZE/BANDAGES/DRESSINGS) ×2 IMPLANT
GAUZE XEROFORM 5X9 LF (GAUZE/BANDAGES/DRESSINGS) ×2 IMPLANT
GLOVE SURG UNDER POLY LF SZ7.5 (GLOVE) ×4 IMPLANT
K-WIRE ACE 1.6X6 (WIRE) ×8
KIT BASIN OR (CUSTOM PROCEDURE TRAY) ×2 IMPLANT
KIT TURNOVER KIT A (KITS) IMPLANT
KWIRE ACE 1.6X6 (WIRE) IMPLANT
NEEDLE HYPO 22GX1.5 SAFETY (NEEDLE) ×2 IMPLANT
NS IRRIG 1000ML POUR BTL (IV SOLUTION) ×2 IMPLANT
PACK ORTHO EXTREMITY (CUSTOM PROCEDURE TRAY) ×2 IMPLANT
PAD CAST 4YDX4 CTTN HI CHSV (CAST SUPPLIES) ×6 IMPLANT
PADDING CAST COTTON 4X4 STRL (CAST SUPPLIES)
PADDING CAST COTTON 6X4 STRL (CAST SUPPLIES) ×1 IMPLANT
PADDING CAST SYN 6 (CAST SUPPLIES) ×1
PADDING CAST SYNTHETIC 4 (CAST SUPPLIES) ×1
PADDING CAST SYNTHETIC 4X4 STR (CAST SUPPLIES) IMPLANT
PADDING CAST SYNTHETIC 6X4 NS (CAST SUPPLIES) IMPLANT
PLATE LOCK 4H 109 LT DIST FIB (Plate) ×1 IMPLANT
PLATE MEDIAL MALLEOLUS 4H HOOK (Plate) ×1 IMPLANT
SCREW CORTICAL 3.5MM 24MM (Screw) ×1 IMPLANT
SCREW LOCK 3.5X12 DIST TIB (Screw) ×1 IMPLANT
SCREW LOCK CORT STAR 3.5X14 (Screw) ×3 IMPLANT
SCREW LOCK CORT STAR 3.5X16 (Screw) ×1 IMPLANT
SCREW LOCK PLATE R3 3.5X34 (Screw) ×1 IMPLANT
SCREW LOCK PLATE R3 3.5X36 (Screw) ×2 IMPLANT
SCREW LOCK PLT 36X3.5X GRLL (Screw) IMPLANT
SCREW NON LOCKING LP 3.5 16MM (Screw) ×3 IMPLANT
SCREW NONLOCK 3.5X34 (Screw) ×1 IMPLANT
SPONGE T-LAP 18X18 ~~LOC~~+RFID (SPONGE) ×2 IMPLANT
STAPLER VISISTAT 35W (STAPLE) ×2 IMPLANT
STOCKINETTE TUBULAR 6 INCH (GAUZE/BANDAGES/DRESSINGS) ×1 IMPLANT
STOCKINETTE TUBULAR COTT 4X25 (GAUZE/BANDAGES/DRESSINGS) ×1 IMPLANT
STRIP CLOSURE SKIN 1/2X4 (GAUZE/BANDAGES/DRESSINGS) ×1 IMPLANT
SUCTION FRAZIER HANDLE 10FR (MISCELLANEOUS) ×2
SUCTION TUBE FRAZIER 10FR DISP (MISCELLANEOUS) ×1 IMPLANT
SUT ETHILON 3 0 PS 1 (SUTURE) ×6 IMPLANT
SUT MON AB 3-0 SH 27 (SUTURE)
SUT MON AB 3-0 SH27 (SUTURE) ×1 IMPLANT
SUT VIC AB 2-0 CT1 27 (SUTURE)
SUT VIC AB 2-0 CT1 TAPERPNT 27 (SUTURE) ×1 IMPLANT
SUT VIC AB 2-0 SH 27 (SUTURE) ×2
SUT VIC AB 2-0 SH 27XBRD (SUTURE) ×1 IMPLANT
SUT VIC AB 3-0 PS2 18 (SUTURE)
SUT VIC AB 3-0 PS2 18XBRD (SUTURE) ×1 IMPLANT
SUT VIC AB 3-0 SH 27 (SUTURE) ×4
SUT VIC AB 3-0 SH 27X BRD (SUTURE) ×2 IMPLANT
ZIPTIGHT ANKLE SYNODESMOSS FIX (Ankle) ×2 IMPLANT

## 2021-08-22 NOTE — Transfer of Care (Signed)
Immediate Anesthesia Transfer of Care Note  Patient: [name]  Procedure(s) Performed: OPEN REDUCTION INTERNAL FIXATION (ORIF) LEFT ANKLE FRACTURE (Left: Ankle)  Patient Location: PACU  Anesthesia Type:General  Level of Consciousness: awake and alert   Airway & Oxygen Therapy: Patient Spontanous Breathing and Patient connected to nasal cannula oxygen  Post-op Assessment: Report given to RN and Post -op Vital signs reviewed and stable  Post vital signs: Reviewed and stable  Last Vitals:  Vitals Value Taken Time  BP 110/55 08/22/21 1323  Temp    Pulse 86 08/22/21 1323  Resp 14 08/22/21 1323  SpO2 94 % 08/22/21 1323  Vitals shown include unvalidated device data.  Last Pain:  Vitals:   08/22/21 0953  TempSrc: Oral  PainSc: 8       Patients Stated Pain Goal: 3 (83/29/19 1660)  Complications: No notable events documented.

## 2021-08-22 NOTE — Brief Op Note (Signed)
08/22/2021  1:24 PM  PATIENT:  [name]  76 y.o. female  PRE-OPERATIVE DIAGNOSIS:  Left trimalleolar ankle fracture  POST-OPERATIVE DIAGNOSIS:  Left trimalleolar ankle fracture  PROCEDURE:  Procedure(s): OPEN REDUCTION INTERNAL FIXATION (ORIF) LEFT ANKLE FRACTURE (Left)  SURGEON:  Surgeon(s) and Role:    * Armond Hang, MD - Primary  PHYSICIAN ASSISTANT: None  ASSISTANTS: none   ANESTHESIA:   regional and general  EBL:  Minimal   BLOOD ADMINISTERED:none  DRAINS: none   LOCAL MEDICATIONS USED:  OTHER Vanc powder  SPECIMEN:  No Specimen  DISPOSITION OF SPECIMEN:  N/A  COUNTS:  YES  TOURNIQUET:   Total Tourniquet Time Documented: Thigh (Left) - 113 minutes Total: Thigh (Left) - 113 minutes   DICTATION: .Note written in EPIC  PLAN OF CARE:  Return to inpatient under hospitalist team  PATIENT DISPOSITION:  PACU - hemodynamically stable.   Delay start of Pharmacological VTE agent (>24hrs) due to surgical blood loss or risk of bleeding: yes

## 2021-08-22 NOTE — Plan of Care (Incomplete)
?  Problem: Education: ?Goal: Knowledge of the prescribed therapeutic regimen will improve ?Outcome: Progressing ?  ?Problem: Activity: ?Goal: Ability to increase mobility will improve ?Outcome: Progressing ?  ?Problem: Pain Management: ?Goal: Pain level will decrease with appropriate interventions ?Outcome: Progressing ?  ?Problem: Skin Integrity: ?Goal: Will show signs of wound healing ?Outcome: Progressing ?  ?

## 2021-08-22 NOTE — Anesthesia Procedure Notes (Signed)
Procedure Name: LMA Insertion Date/Time: 08/22/2021 10:40 AM Performed by: Wilburn Cornelia, CRNA Pre-anesthesia Checklist: Patient identified, Emergency Drugs available, Suction available, Patient being monitored and Timeout performed Patient Re-evaluated:Patient Re-evaluated prior to induction Oxygen Delivery Method: Circle system utilized Preoxygenation: Pre-oxygenation with 100% oxygen Induction Type: IV induction LMA: LMA inserted LMA Size: 4.0 Number of attempts: 1 Placement Confirmation: positive ETCO2 and breath sounds checked- equal and bilateral Tube secured with: Tape Dental Injury: Teeth and Oropharynx as per pre-operative assessment

## 2021-08-22 NOTE — Plan of Care (Signed)
?  Problem: Education: ?Goal: Knowledge of the prescribed therapeutic regimen will improve ?Outcome: Progressing ?  ?Problem: Activity: ?Goal: Ability to increase mobility will improve ?Outcome: Progressing ?  ?Problem: Pain Management: ?Goal: Pain level will decrease with appropriate interventions ?Outcome: Progressing ?  ?Problem: Skin Integrity: ?Goal: Will show signs of wound healing ?Outcome: Progressing ?  ?

## 2021-08-22 NOTE — Discharge Instructions (Signed)
Armond Hang, MD [facility]  Please read the following information regarding your care after surgery.  Medications  You only need a prescription for the narcotic pain medicine (ex. oxycodone, Percocet, Norco).  All of the other medicines listed below are available over the counter. ? Aleve 2 pills twice a day for the first 3 days after surgery. ? acetominophen (Tylenol) 650 mg every 4-6 hours as you need for minor to moderate pain ? oxycodone as prescribed for severe pain  ? To help prevent blood clots, take lovenox 40 mg once daily after surgery.  You should also get up every hour while you are awake to move around.  Weight Bearing ? Do NOT bear any weight on the operated leg or foot.  Cast / Splint / Dressing ? If you have a splint, do NOT remove this. Keep your splint, cast or dressing clean and dry.  Dont put anything (coat hanger, pencil, etc) down inside of it.  If it gets wet, call the office immediately to schedule an appointment for a cast change.  Swelling IMPORTANT: It is normal for you to have swelling where you had surgery. To reduce swelling and pain, keep at least 3 pillows under your leg so that your toes are above your nose and your heel is above the level of your hip.  It may be necessary to keep your foot or leg elevated for several weeks.  This is critical to helping your incisions heal and your pain to feel better.  Follow Up Call my office at [phone] when you are discharged from the hospital or surgery center to schedule an appointment to be seen 1-2 weeks after surgery.  Call my office at [phone] if you develop a fever >101.5 F, nausea, vomiting, bleeding from the surgical site or severe pain.

## 2021-08-22 NOTE — Progress Notes (Signed)
PROGRESS NOTE    SHENG PRITZ  YNW:[id number] DOB: 1946-02-21 DOA: 08/20/2021 PCP: Wardell Honour, MD   Brief Narrative:  [name] is a 76 y.o. 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 Orthopedics consulted.  Assessment & Plan:  Left ankle fracture secondary to mechanical slip/fall at home: Acute.   -Orthopedics Dr. Kathaleen Bury with plans to take for surgery later today -Oxycodone/morphine as needed for moderate to severe pain per orthopedics -Increased risk for ongoing fracture given chronic steroid use and possible underlying secondary osteoporosis - may benefit from bisphosphonate therapy - has been on previously but discontinued last year.   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   History of breast cancer: Diagnosed back in 2010 status post right lumpectomy and subsequent radiation.   GERD: Patient alternates between 20 and 40 mg of omeprazole every other day. -Pharmacy substitution of Protonix   DVT prophylaxis: Lovenox Code Status: Full Family Communication: None available  Status is: Inpatient  Dispo: The patient is from: Home              Anticipated d/c is to: To be determined              Anticipated d/c date is: 48 to 72 hours              Patient currently not medically stable for  discharge  Consultants:  Orthopedic surgery  Procedures:  Left ankle fracture repair per orthopedic surgery  Antimicrobials:  None indicated  Subjective: No acute issues or events overnight pain currently well controlled -anxiously awaiting surgery  Objective: Vitals:   08/21/21 1558 08/21/21 2047 08/22/21 0428 08/22/21 0512  BP: 129/81 (!) 150/61 (!) 163/83 (!) 160/97  Pulse: 91 100 (!) 111 (!) 110  Resp: 16 18 20 18   Temp: 98.2 F (36.8 C) 99.2 F (37.3 C) 99.1 F (37.3 C) 99.4 F (37.4 C)  TempSrc: Oral Oral Oral Oral  SpO2: 92% 94% 92% 92%    Intake/Output Summary (Last 24 hours) at 08/22/2021 0737 Last data filed at 08/22/2021 0500 Gross per 24 hour  Intake --  Output 2101 ml  Net -2101 ml    There were no vitals filed for this visit.  Examination:  General:  Pleasantly resting in bed, No acute distress. HEENT:  Normocephalic atraumatic.  Sclerae nonicteric, noninjected.  Extraocular movements intact bilaterally. Neck:  Without mass or deformity.  Trachea is midline. Lungs:  Clear to auscultate bilaterally without rhonchi, wheeze, or rales. Heart:  Regular rate and rhythm.  Without murmurs, rubs, or gallops. Abdomen:  Soft, nontender, nondistended.  Without guarding or rebound. Extremities: Left foot bandage clean dry intact  Data Reviewed: I have personally reviewed following labs and imaging studies  CBC: Recent Labs  Lab 08/20/21 1351 08/21/21 0143  WBC 9.2 11.0*  NEUTROABS 7.0  --   HGB 11.8* 12.9  HCT 37.8 39.5  MCV 96.9 94.7  PLT 200 623    Basic Metabolic Panel: Recent Labs  Lab 08/20/21 1351 08/21/21 0143  NA 137 138  K 5.0 4.1  CL 103 101  CO2 25 26  GLUCOSE 114* 119*  BUN 17 12  CREATININE 0.54 0.59  CALCIUM 8.9 9.7    GFR: CrCl cannot be calculated (Unknown ideal weight.). Liver Function Tests: No results for input(s): AST, ALT, ALKPHOS, BILITOT, PROT, ALBUMIN in the last 168 hours. No results for input(s): LIPASE,  AMYLASE in the last 168 hours. No results for input(s): AMMONIA in the last 168 hours. Coagulation Profile: No results for input(s): INR, PROTIME in the last 168 hours. Cardiac Enzymes: No results for input(s): CKTOTAL, CKMB, CKMBINDEX, TROPONINI in the last 168 hours. BNP (last 3 results) No results for input(s): PROBNP in the last 8760 hours. HbA1C: No results for input(s): HGBA1C in the last 72 hours. CBG: No results for input(s): GLUCAP in the last 168 hours. Lipid Profile: No results for input(s): CHOL, HDL, LDLCALC, TRIG, CHOLHDL, LDLDIRECT in the last 72 hours. Thyroid Function Tests: No results for input(s): TSH, T4TOTAL, FREET4, T3FREE, THYROIDAB in the last 72 hours. Anemia Panel: No results for input(s): VITAMINB12, FOLATE, FERRITIN, TIBC, IRON, RETICCTPCT in the last 72 hours. Sepsis Labs: No results for input(s): PROCALCITON, LATICACIDVEN in the last 168 hours.  Recent Results (from the past 240 hour(s))  Resp Panel by RT-PCR (Flu A&B, Covid) Nasopharyngeal Swab     Status: None   Collection Time: 08/20/21  2:02 PM   Specimen: Nasopharyngeal Swab; Nasopharyngeal(NP) swabs in vial transport medium  Result Value Ref Range Status   SARS Coronavirus 2 by RT PCR NEGATIVE NEGATIVE Final    Comment: (NOTE) SARS-CoV-2 target nucleic acids are NOT DETECTED.  The SARS-CoV-2 RNA is generally detectable in upper respiratory specimens during the acute phase of infection. The lowest concentration of SARS-CoV-2 viral copies this assay can detect is 138 copies/mL. A negative result does not preclude SARS-Cov-2 infection and should not be used as the sole basis for treatment or other patient management decisions. A negative result may occur with  improper specimen collection/handling, submission of specimen other than nasopharyngeal swab, presence of viral mutation(s) within the areas targeted by this assay, and inadequate number of viral copies(<138 copies/mL). A negative result  must be combined with clinical observations, patient history, and epidemiological information. The expected result is Negative.  Fact Sheet for Patients:  EntrepreneurPulse.com.au  Fact Sheet for Healthcare Providers:  IncredibleEmployment.be  This test is no t yet approved or cleared by the [place] FDA and  has been authorized for detection and/or diagnosis of SARS-CoV-2 by FDA under an Emergency Use Authorization (EUA). This EUA will remain  in effect (meaning this test can be used) for the duration of the COVID-19 declaration under Section 564(b)(1) of the Act, 21 U.S.C.section 360bbb-3(b)(1), unless the authorization is terminated  or revoked sooner.       Influenza A by PCR NEGATIVE NEGATIVE Final   Influenza B by PCR NEGATIVE NEGATIVE Final    Comment: (NOTE) The Xpert Xpress SARS-CoV-2/FLU/RSV plus assay is intended as an aid in the diagnosis of influenza from Nasopharyngeal swab specimens and should not be used as a sole basis for treatment. Nasal washings and aspirates are unacceptable for Xpert Xpress SARS-CoV-2/FLU/RSV testing.  Fact Sheet for Patients: EntrepreneurPulse.com.au  Fact Sheet for Healthcare Providers: IncredibleEmployment.be  This test is not yet approved or cleared by the [place]  FDA and has been authorized for detection and/or diagnosis of SARS-CoV-2 by FDA under an Emergency Use Authorization (EUA). This EUA will remain in effect (meaning this test can be used) for the duration of the COVID-19 declaration under Section 564(b)(1) of the Act, 21 U.S.C. section 360bbb-3(b)(1), unless the authorization is terminated or revoked.  Performed at [facility], [place] [address], [place], [place] [address]   Surgical pcr screen     Status: Abnormal   Collection Time: 08/21/21 11:10 AM   Specimen: Nasal Mucosa; Nasal Swab  Result Value Ref Range Status   MRSA, PCR  NEGATIVE NEGATIVE Final   Staphylococcus aureus POSITIVE (A) NEGATIVE Final    Comment: (NOTE) The Xpert SA Assay (FDA approved for NASAL specimens in patients 61 years of age and older), is one component of a comprehensive surveillance program. It is not intended to diagnose infection nor to guide or monitor treatment. Performed at [facility], [place] [address] [place], [place] [address]           Radiology Studies: DG Tibia/Fibula Left  Result Date: 08/20/2021 CLINICAL DATA:  Fall, left leg and ankle pain EXAM: LEFT TIBIA AND FIBULA - 2 VIEW COMPARISON:  None. FINDINGS: Tricompartmental marginal spurring in the knee without knee effusion. Fractures of the lateral and medial malleoli as discussed under the dedicated ankle imaging. Plantar calcaneal spur. IMPRESSION: 1. Malleolar fractures better shown on dedicated ankle imaging. 2. 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 Electronically Signed   By: Van Clines M.D.   On: 08/20/2021 11:25   DG Ankle Complete Left  Result Date: 08/20/2021 CLINICAL DATA:  Left lower leg pain.  Left ankle injury after fall. EXAM: LEFT ANKLE COMPLETE - 3+ VIEW COMPARISON:  None. FINDINGS: Relatively nondisplaced and non dislocated stage IV Weber B fracture of the left ankle with oblique lateral malleolar component, transverse medial malleolar component (with mild comminution but also includes an oblique component), and oblique posterior malleolar component. Surrounding soft tissue swelling noted. Plantar calcaneal spur. IMPRESSION: 1. Stage IV Weber B fracture of the left ankle. Electronically Signed   By: Van Clines M.D.   On: 08/20/2021 11:24   CT ANKLE LEFT WO CONTRAST  Result Date: 08/20/2021 CLINICAL DATA:  Ankle fracture, preoperative planning. EXAM: CT OF THE LEFT ANKLE WITHOUT CONTRAST TECHNIQUE: Multidetector CT imaging of the left ankle was performed according to the standard protocol. Multiplanar CT image  reconstructions were also generated. RADIATION DOSE REDUCTION: This exam was performed according to the departmental dose-optimization program which includes automated exposure control, adjustment of the mA and/or kV according to patient size and/or use of iterative reconstruction technique. COMPARISON:  Radiograph earlier today. FINDINGS: Bones/Joint/Cartilage Comminuted and mildly displaced medial malleolar fracture with dominant fracture plane transverse at the level of the ankle mortise. There is also involvement of the anteromedial aspect of the tibial metaphysis. Mildly displaced and comminuted posterior malleolar fracture. Articular surface displacement is 2-3 mm. Oblique displaced distal fibular fracture at and just above the level of the ankle mortise. There are multiple small fracture fragments in the lateral clear space and in the lateral ankle joint. Slight lateral displacement of the talus. Intact talar dome. Plantar calcaneal spur. Ligaments Suboptimally assessed by CT. Muscles and Tendons No evidence of tendon entrapment.  Intact Achilles tendon. Soft tissues Diffuse soft tissue edema. IMPRESSION: 1. Mildly displaced trimalleolar fracture. 2. Slight lateral displacement of the talus. Multiple small fracture fragments in the lateral ankle joint. Electronically Signed  By: Keith Rake M.D.   On: 08/20/2021 23:10    Scheduled Meds:  calcium-vitamin D  1 tablet Oral Q breakfast   Chlorhexidine Gluconate Cloth  6 each Topical Q0600   cholecalciferol  5,000 Units Oral Daily   mupirocin ointment  1 application Nasal BID   omeprazole  20 mg Oral QODAY   And   [START ON 08/23/2021] omeprazole  40 mg Oral QODAY   senna-docusate  1 tablet Oral BID   vitamin B-12  5,000 mcg Oral Daily    LOS: 2 days   Time spent: 68min  Darlette Dubow C Haylee Mcanany, DO [facility]  If 7PM-7AM, please contact night-coverage [email or web address]  08/22/2021, 7:37 AM

## 2021-08-22 NOTE — Op Note (Addendum)
08/22/2021  7:02 PM   PATIENT: [name]  76 y.o. female  MRN: [id number]   PRE-OPERATIVE DIAGNOSIS:   Left trimalleolar ankle fracture   POST-OPERATIVE DIAGNOSIS:   Left trimalleolar ankle fracture   PROCEDURE: Open reduction internal fixation of left trimalleolar ankle fracture without fixation of posterior lip Open fixation of left ankle syndesmosis   SURGEON:  Armond Hang, MD   ASSISTANT: None   ANESTHESIA: General, regional   EBL: Minimal   TOURNIQUET:    Total Tourniquet Time Documented: Thigh (Left) - 113 minutes Total: Thigh (Left) - 417 minutes    COMPLICATIONS: None apparent   DISPOSITION: Extubated, awake and stable to recovery.   INDICATION FOR PROCEDURE: The patient is a 69 yr F with PMH notable for PMR on chronic steroids, GERD, melanoma s/p immunotherapy (2015), breast CA s/p lumpectomy & radiation. She is admitted to [facility] following a fall from standing while walking on icy ramp at home on 08/20/21. She was splinted in the ER and admitted for pain control and mobilization. Based on radiographs, the patient has a displaced trimalleolar fracture of the left ankle requiring operative fixation.  We discussed the diagnosis, alternative treatment options, risks and benefits of the above surgical intervention, as well as alternative non-operative treatments. All questions/concerns were addressed and the patient/family demonstrated appropriate understanding of the diagnosis, the procedure, the postoperative course, and overall prognosis. The patient wished to proceed with surgical intervention and signed an informed surgical consent as such, in each others presence prior to surgery.   PROCEDURE IN DETAIL: After preoperative consent was obtained and the correct operative site was identified, the patient was brought to the operating room supine on stretcher and transferred onto operating table. General anesthesia was induced. Preoperative  antibiotics were administered. Surgical timeout was taken. The patient was then positioned supine with an ipsilateral hip bump. The operative lower extremity was prepped and draped in standard sterile fashion with a tourniquet around the thigh. The extremity was exsanguinated and the tourniquet was inflated to 300 mmHg.  A standard lateral incision was made over the distal fibula. Dissection was carried down to the level of the fibula and the fracture site identified. The superficial peroneal nerve was identified and protected throughout the procedure. The fibula was noted to be shortened with interposed periosteum. The fibula was brought out to length. The fibula fracture was debrided and the edges defined to achieve cortical read. Reduction maneuver was performed using pointed reduction forceps and lobster forceps. In this manner, the fibula length was restored and fracture reduced. A lag screw was placed according to the orientation of fracture lines and comminution. Due to poor bone quality and extensive comminution at the fracture site, it was decided to use an anatomic locking distal fibula plate. We then selected a Zimmer anatomic locking plate to match the anatomy of the distal fibula and placed it laterally. This was implanted under intraoperative fluoroscopy with a combination of distal locking screws and proximal cortical & locking screws.  We then made a direct medial ankle approach and extended this proximally in anticipation of implanting a hook plate. Dissection was carried down to the level of the medial malleolar fragment. A dental pick and freer elevator were used to reduce the medial malleolar fragment. Of note, there was extensive comminution of this fragment into multiple segments, all of which had very poor bone quality. A Paragon28 medial distal tibia hook plate was utilized to fix the reduced medial malleolar fragment and the tines  were carefully inserted into the distal tip of the  malleolus. The plate was applied posterosuperior to anterioinferior to best capture the major fragments of the medial comminution. The other fragments were too small/comminuted to undergo screw fixation. We placed a non-locking screw in the hook plate in compression mode to further obtain compression across the fracture. We implanted locking screws proximally to further secure the plate.   A manual external rotation stress radiograph was obtained and demonstrated widening of the ankle mortise. Given this intraoperative finding as well as preoperative subluxation, it was decided to reduce and fix the syndesmosis. Therefore a suture fixation system (ZipTight device) was implanted through the fibula plate in cannulated fashion to fix the syndesmosis. Anchor/button position was verified along anteromedial tibial cortex by both fluoroscopy and direct visualization through the medial incision. A repeat stress radiograph showed complete stability of the ankle mortise to testing.  The surgical sites were thoroughly irrigated. The tourniquet was deflated and hemostasis achieved. Vancomycin powder was applied to the surgical sites. The deep layers were closed using 2-0 vicryl and the subcutaneous tissues closed using 3-0 vicryl. The skin was closed without tension using 3-0 nylon suture.   The leg was cleaned with saline and sterile xeroform dressings with gauze were applied. A well padded bulky short leg splint was applied. The patient was awakened from anesthesia and transported to the recovery room in stable condition.    FOLLOW UP PLAN: -transfer to PACU, then return to [facility] under primary hospitalist team -IV Vanc & Cipro x 24 hrs -strict NWB operative extremity, maximum elevation -maintain short leg splint until follow up -pain meds per primary team -DVT Ppx: Lovenox 40 mg once daily start POD1 -physical therapy to assist with NWB, crutch training -discharge per primary team -follow up in 7-10 days for  wound check -sutures out in 2-3 weeks with exchange of short leg splint to short leg cast in outpatient office   RADIOGRAPHS: AP, lateral, oblique and stress radiographs of the left ankle were obtained intraoperatively. These showed interval reduction and fixation of the fractures. Manual stress radiographs were taken and the ankle mortise and tibiofibular relationship were noted to be stable following fixation. All hardware is appropriately positioned and of the appropriate lengths. No other acute injuries are noted.   Armond Hang Orthopaedic Surgery [facility]

## 2021-08-22 NOTE — Anesthesia Procedure Notes (Addendum)
Anesthesia Regional Block: Popliteal block   Pre-Anesthetic Checklist: , timeout performed,  Correct Patient, Correct Site, Correct Laterality,  Correct Procedure, Correct Position, site marked,  Risks and benefits discussed,  Surgical consent,  Pre-op evaluation,  At surgeon's request and post-op pain management  Laterality: Lower and Left  Prep: chloraprep       Needles:  Injection technique: Single-shot  Needle Type: Stimiplex     Needle Length: 10cm  Needle Gauge: 21     Additional Needles:   Procedures:,,,, ultrasound used (permanent image in chart),,   Motor weakness within 5 minutes.  Narrative:  Start time: 08/22/2021 10:16 AM End time: 08/22/2021 10:20 AM Injection made incrementally with aspirations every 5 mL.  Performed by: Personally  Anesthesiologist: Nolon Nations, MD  Additional Notes: Nerve located and needle positioned with direct ultrasound guidance. Good perineural spread. Patient tolerated well.

## 2021-08-22 NOTE — Anesthesia Postprocedure Evaluation (Signed)
Anesthesia Post Note  Patient: [name]  Procedure(s) Performed: OPEN REDUCTION INTERNAL FIXATION (ORIF) LEFT ANKLE FRACTURE (Left: Ankle)     Patient location during evaluation: PACU Anesthesia Type: General Level of consciousness: sedated and patient cooperative Pain management: pain level controlled Vital Signs Assessment: post-procedure vital signs reviewed and stable Respiratory status: spontaneous breathing Cardiovascular status: stable Anesthetic complications: no   No notable events documented.  Last Vitals:  Vitals:   08/22/21 1437 08/22/21 1638  BP: 116/66 (!) 111/59  Pulse: 89 79  Resp: 16 16  Temp: 36.9 C 36.8 C  SpO2: 93% 92%    Last Pain:  Vitals:   08/22/21 1638  TempSrc: Oral  PainSc:                  Nolon Nations

## 2021-08-22 NOTE — Anesthesia Procedure Notes (Addendum)
Anesthesia Regional Block: Adductor canal block   Pre-Anesthetic Checklist: , timeout performed,  Correct Patient, Correct Site, Correct Laterality,  Correct Procedure, Correct Position, site marked,  Risks and benefits discussed,  Surgical consent,  Pre-op evaluation,  At surgeon's request and post-op pain management  Laterality: Lower and Left  Prep: chloraprep       Needles:  Injection technique: Single-shot  Needle Type: Stimiplex     Needle Length: 9cm  Needle Gauge: 21     Additional Needles:   Procedures:,,,, ultrasound used (permanent image in chart),,    Narrative:  Start time: 08/22/2021 10:00 AM End time: 08/22/2021 10:16 AM Injection made incrementally with aspirations every 5 mL.  Performed by: Personally  Anesthesiologist: Nolon Nations, MD  Additional Notes: BP cuff, EKG monitors applied. Sedation begun. Artery and nerve location verified with ultrasound. Anesthetic injected incrementally (78ml), slowly, and after negative aspirations under direct u/s guidance. Good fascial/perineural spread. Tolerated well.

## 2021-08-22 NOTE — H&P (Signed)
H&P Update: ? ?-History and Physical Reviewed ? ?-Patient has been re-examined ? ?-No change in the plan of care ? ?-The risks and benefits were presented and reviewed. The risks due to hardware failure/irritation, new/persistent infection, stiffness, nerve/vessel/tendon injury, nonunion/malunion, wound healing issues, development of arthritis, failure of this surgery, possibility of external fixation with delayed definitive surgery, need for further surgery, thromboembolic events, anesthesia/medical complications, amputation, death among others were discussed. The patient acknowledged the explanation, agreed to proceed with the plan and a consent was signed. ? ?[name] ? ?

## 2021-08-22 NOTE — Progress Notes (Signed)
Subjective: Day of Surgery Procedure(s) (LRB): OPEN REDUCTION INTERNAL FIXATION (ORIF) ANKLE FRACTURE (Left)  Patient reports pain as mild to moderate. She is elevating left lower extremity and looking forward to getting surgery done this morning.  Objective:   VITALS:  Temp:  [98.2 F (36.8 C)-99.4 F (37.4 C)] 99.2 F (37.3 C) (01/28 0953) Pulse Rate:  [91-111] 111 (01/28 0953) Resp:  [16-20] 16 (01/28 0953) BP: (129-172)/(61-97) 172/77 (01/28 0953) SpO2:  [92 %-94 %] 94 % (01/28 0953) Weight:  [951 kg] 117 kg (01/28 0953)  Gen: AAOx3, NAD  Left lower extremity: Short leg splint in place Wiggles toes SILT over toes CR<2s    LABS Recent Labs    08/20/21 1351 08/21/21 0143  HGB 11.8* 12.9  WBC 9.2 11.0*  PLT 200 226   Recent Labs    08/20/21 1351 08/21/21 0143  NA 137 138  K 5.0 4.1  CL 103 101  CO2 25 26  BUN 17 12  CREATININE 0.54 0.59  GLUCOSE 114* 119*   No results for input(s): LABPT, INR in the last 72 hours.   Assessment/Plan: Day of Surgery75 yr F admitted for left trimalleolar ankle fracture with PMH notable for PMR on chronic steroids, GERD, melanoma s/p immunotherapy (2015), breast CA s/p lumpectomy & radiation  -OR today 08/22/21 for left ankle ORIF, possible syndesmosis fixation and possible external fixation with delayed definitive fixation of this ankle -NPO since MN on 08/22/21 at 0000h -held VTE chemoppx for 24 hr prior -pt cleared by primary team for surgery -NWB LLE, max elevation -PT/OT/CM consults  The risks and benefits were presented and reviewed. The risks due to hardware failure/irritation, new/persistent infection, stiffness, nerve/vessel/tendon injury, nonunion/malunion, wound healing issues, development of arthritis, failure of this surgery, possibility of external fixation with delayed definitive surgery, need for further surgery, thromboembolic events, anesthesia/medical complications, amputation, death among others were  discussed. The patient acknowledged the explanation, agreed to proceed with the plan.   Armond Hang 08/22/2021, 10:13 AM

## 2021-08-23 DIAGNOSIS — S82892A Other fracture of left lower leg, initial encounter for closed fracture: Secondary | ICD-10-CM | POA: Diagnosis not present

## 2021-08-23 MED ORDER — HYDROMORPHONE HCL 1 MG/ML IJ SOLN
1.0000 mg | INTRAMUSCULAR | Status: DC | PRN
  Administered 2021-08-23 – 2021-08-24 (×2): 1 mg via INTRAVENOUS
  Filled 2021-08-23 (×2): qty 1

## 2021-08-23 MED ORDER — POLYETHYLENE GLYCOL 3350 17 G PO PACK
17.0000 g | PACK | Freq: Two times a day (BID) | ORAL | Status: DC
  Administered 2021-08-23 – 2021-08-26 (×5): 17 g via ORAL
  Filled 2021-08-23 (×5): qty 1

## 2021-08-23 NOTE — Plan of Care (Signed)
°  Problem: Education: Goal: Knowledge of the prescribed therapeutic regimen will improve Outcome: Progressing   Problem: Activity: Goal: Ability to increase mobility will improve Outcome: Progressing   Problem: Physical Regulation: Goal: Postoperative complications will be avoided or minimized Outcome: Progressing   Problem: Pain Management: Goal: Pain level will decrease with appropriate interventions Outcome: Progressing

## 2021-08-23 NOTE — Evaluation (Signed)
Physical Therapy Evaluation Patient Details Name: [name] MRN: [id number] DOB: 24-Apr-1946 Today's Date: 08/23/2021  History of Present Illness  Pt is a 76 y.o. F who presents 08/20/2021 after a left ankle fracture after a fall now s/p ORIF 1/28. Significant PMH: DM2, melanoma s/p immunotherapy, breast CA s/p lumpectomy and radiation.  Clinical Impression  PTA, pt lives at home alone and is independent. Pt reports left foot still numb and is unable to wiggle toes. Pt presents with decreased functional mobility secondary to gross weakness (particularly LLE), new weightbearing precautions, impaired balance and decreased activity tolerance. Pt requiring two person moderate assist for transfers to standing using a walker. Decreased compliance to weightbearing precautions due to poor sensation. Recommend SNF at discharge.     Recommendations for follow up therapy are one component of a multi-disciplinary discharge planning process, led by the attending physician.  Recommendations may be updated based on patient status, additional functional criteria and insurance authorization.  Follow Up Recommendations Skilled nursing-short term rehab (<3 hours/day)    Assistance Recommended at Discharge PRN  Patient can return home with the following  A lot of help with walking and/or transfers;A lot of help with bathing/dressing/bathroom    Equipment Recommendations BSC/3in1;Wheelchair (measurements PT);Wheelchair cushion (measurements PT) (bariatric)  Recommendations for Other Services       Functional Status Assessment Patient has had a recent decline in their functional status and demonstrates the ability to make significant improvements in function in a reasonable and predictable amount of time.     Precautions / Restrictions Precautions Precautions: Fall Restrictions Weight Bearing Restrictions: Yes LLE Weight Bearing: Non weight bearing      Mobility  Bed Mobility Overal bed mobility:  Needs Assistance Bed Mobility: Supine to Sit     Supine to sit: Min guard          Transfers Overall transfer level: Needs assistance Equipment used: Rolling walker (2 wheels) Transfers: Sit to/from Stand Sit to Stand: Mod assist, +2 physical assistance           General transfer comment: ModA + 2 to rise to partial standing position x 2, on 2nd trial, pulled bed away and placed BSC for pt to sit. Pt stood again from [facility] and pulled away and placed recliner chair. Pt with difficulty keeping LLE elevated off ground due to gross weakness    Ambulation/Gait                  Stairs            Wheelchair Mobility    Modified Rankin (Stroke Patients Only)       Balance Overall balance assessment: Needs assistance Sitting-balance support: Feet supported Sitting balance-Leahy Scale: Good     Standing balance support: Bilateral upper extremity supported Standing balance-Leahy Scale: Poor                               Pertinent Vitals/Pain Pain Assessment Pain Assessment: Faces Faces Pain Scale: Hurts little more Pain Location: stomach discomfort (pt constipated) Pain Descriptors / Indicators: Discomfort Pain Intervention(s): Monitored during session    Home Living Family/patient expects to be discharged to:: Private residence Living Arrangements: Alone Available Help at Discharge: Family;Available PRN/intermittently Type of Home: House Home Access: Ramped entrance       Home Layout: One level Home Equipment: BSC/3in1;Rolling Walker (2 wheels);Transport chair      Prior Function Prior Level of Function : Independent/Modified  Independent             Mobility Comments: Independent, driving       Hand Dominance        Extremity/Trunk Assessment   Upper Extremity Assessment Upper Extremity Assessment: Defer to OT evaluation    Lower Extremity Assessment RLE Deficits / Details: At least 3/5 strength LLE Deficits /  Details: Ankle fx s/p ORIF. Pt unable to wiggle toes, grossly weak hip/knee       Communication   Communication: No difficulties  Cognition Arousal/Alertness: Awake/alert Behavior During Therapy: WFL for tasks assessed/performed Overall Cognitive Status: Within Functional Limits for tasks assessed                                          General Comments      Exercises     Assessment/Plan    PT Assessment Patient needs continued PT services  PT Problem List Decreased strength;Decreased activity tolerance;Decreased balance;Decreased mobility;Obesity       PT Treatment Interventions DME instruction;Functional mobility training;Therapeutic activities;Therapeutic exercise;Balance training;Patient/family education;Wheelchair mobility training    PT Goals (Current goals can be found in the Care Plan section)  Acute Rehab PT Goals Patient Stated Goal: get stronger, agreeable to rehab PT Goal Formulation: With patient Time For Goal Achievement: 09/06/21 Potential to Achieve Goals: Good    Frequency Min 3X/week     Co-evaluation               AM-PAC PT "6 Clicks" Mobility  Outcome Measure Help needed turning from your back to your side while in a flat bed without using bedrails?: A Little Help needed moving from lying on your back to sitting on the side of a flat bed without using bedrails?: A Little Help needed moving to and from a bed to a chair (including a wheelchair)?: Total Help needed standing up from a chair using your arms (e.g., wheelchair or bedside chair)?: Total Help needed to walk in hospital room?: Total Help needed climbing 3-5 steps with a railing? : Total 6 Click Score: 10    End of Session Equipment Utilized During Treatment: Gait belt Activity Tolerance: Patient tolerated treatment well Patient left: in chair;with call bell/phone within reach;with chair alarm set Nurse Communication: Mobility status;Need for lift equipment PT  Visit Diagnosis: Unsteadiness on feet (R26.81);Other abnormalities of gait and mobility (R26.89);Muscle weakness (generalized) (M62.81);Difficulty in walking, not elsewhere classified (R26.2)    Time: 5784-6962 PT Time Calculation (min) (ACUTE ONLY): 47 min   Charges:   PT Evaluation $PT Eval Moderate Complexity: 1 Mod PT Treatments $Therapeutic Activity: 23-37 mins        Wyona Almas, PT, DPT Acute Rehabilitation Services Pager [phone] Office [phone]   Deno Etienne 08/23/2021, 4:25 PM

## 2021-08-23 NOTE — Progress Notes (Signed)
Notified by RN that pt in severe pain and morphine is not helping control pain.   Had Oxycodone an hour ago. Pt is crying in pain.  D/C morphine since not working. Dilaudid 1 mg IV Q 3 hr x 3 for severe pain ordered

## 2021-08-23 NOTE — Progress Notes (Signed)
, PROGRESS NOTE    [name]  UMP:[id number] DOB: 1945/09/22 DOA: 08/20/2021 PCP: Wardell Honour, MD   Brief Narrative:  [name] is a 76 y.o. 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 Orthopedics consulted.  Assessment & Plan:  Left ankle fracture secondary to mechanical slip/fall at home: Acute.   -Orthopedics following - s/p ORIF 08/22/21 - tolerated well -Oxycodone/morphine as needed for moderate to severe pain per orthopedics -Increased risk for ongoing fracture given chronic steroid use and possible underlying secondary osteoporosis - may benefit from bisphosphonate therapy - has been on previously but discontinued last year. 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   History of breast cancer: Diagnosed back in 2010 status post right lumpectomy and subsequent radiation.   GERD: Patient alternates between 20 and 40 mg of omeprazole every other day. -Pharmacy substitution of Protonix   DVT prophylaxis: Lovenox Code Status: Full Family Communication: None available  Status is: Inpatient  Dispo: The patient is from: Home              Anticipated d/c is to: To be determined               Anticipated d/c date is: 48 to 72 hours              Patient currently not medically stable for discharge  Consultants:  Orthopedic surgery  Procedures:  Left ankle fracture repair per orthopedic surgery  Antimicrobials:  None indicated  Subjective: No acute issues or events overnight pain currently well controlled -looking forward to PT  Objective: Vitals:   08/22/21 1353 08/22/21 1437 08/22/21 1638 08/22/21 2046  BP: 106/60 116/66 (!) 111/59 (!) 97/57  Pulse: 84 89 79 75  Resp: 13 16 16 16   Temp: 98.3 F (36.8 C) 98.4 F (36.9 C) 98.2 F (36.8 C) 98.3 F (36.8 C)  TempSrc:  Oral Oral Oral  SpO2: 97% 93% 92% 92%  Weight:      Height:        Intake/Output Summary (Last 24 hours) at 08/23/2021 0745 Last data filed at 08/23/2021 0301 Gross per 24 hour  Intake 1100 ml  Output 925 ml  Net 175 ml    Filed Weights   08/22/21 0953  Weight: 117 kg    Examination:  General:  Pleasantly resting in bed, No acute distress. HEENT:  Normocephalic atraumatic.  Sclerae nonicteric, noninjected.  Extraocular movements intact bilaterally. Neck:  Without mass or deformity.  Trachea is midline. Lungs:  Clear to auscultate bilaterally without rhonchi, wheeze, or rales. Heart:  Regular rate and rhythm.  Without murmurs, rubs, or gallops. Abdomen:  Soft, nontender, nondistended.  Without guarding or rebound. Extremities: Left foot bandage/wrap clean dry intact  Data Reviewed: I  have personally reviewed following labs and imaging studies  CBC: Recent Labs  Lab 08/20/21 1351 08/21/21 0143  WBC 9.2 11.0*  NEUTROABS 7.0  --   HGB 11.8* 12.9  HCT 37.8 39.5  MCV 96.9 94.7  PLT 200 778    Basic Metabolic Panel: Recent Labs  Lab 08/20/21 1351 08/21/21 0143  NA 137 138  K 5.0 4.1  CL 103 101  CO2 25 26  GLUCOSE 114* 119*  BUN 17 12  CREATININE 0.54 0.59  CALCIUM 8.9 9.7    GFR: Estimated Creatinine Clearance: 75.2 mL/min (by C-G formula based on SCr of 0.59  mg/dL). Liver Function Tests: No results for input(s): AST, ALT, ALKPHOS, BILITOT, PROT, ALBUMIN in the last 168 hours. No results for input(s): LIPASE, AMYLASE in the last 168 hours. No results for input(s): AMMONIA in the last 168 hours. Coagulation Profile: No results for input(s): INR, PROTIME in the last 168 hours. Cardiac Enzymes: No results for input(s): CKTOTAL, CKMB, CKMBINDEX, TROPONINI in the last 168 hours. BNP (last 3 results) No results for input(s): PROBNP in the last 8760 hours. HbA1C: No results for input(s): HGBA1C in the last 72 hours. CBG: No results for input(s): GLUCAP in the last 168 hours. Lipid Profile: No results for input(s): CHOL, HDL, LDLCALC, TRIG, CHOLHDL, LDLDIRECT in the last 72 hours. Thyroid Function Tests: No results for input(s): TSH, T4TOTAL, FREET4, T3FREE, THYROIDAB in the last 72 hours. Anemia Panel: No results for input(s): VITAMINB12, FOLATE, FERRITIN, TIBC, IRON, RETICCTPCT in the last 72 hours. Sepsis Labs: No results for input(s): PROCALCITON, LATICACIDVEN in the last 168 hours.  Recent Results (from the past 240 hour(s))  Resp Panel by RT-PCR (Flu A&B, Covid) Nasopharyngeal Swab     Status: None   Collection Time: 08/20/21  2:02 PM   Specimen: Nasopharyngeal Swab; Nasopharyngeal(NP) swabs in vial transport medium  Result Value Ref Range Status   SARS Coronavirus 2 by RT PCR NEGATIVE NEGATIVE Final    Comment: (NOTE) SARS-CoV-2 target nucleic acids are NOT DETECTED.  The SARS-CoV-2 RNA is generally detectable in upper respiratory specimens during the acute phase of infection. The lowest concentration of SARS-CoV-2 viral copies this assay can detect is 138 copies/mL. A negative result does not preclude SARS-Cov-2 infection and should not be used as the sole basis for treatment or other patient management decisions. A negative result may occur with  improper specimen collection/handling, submission of specimen other than  nasopharyngeal swab, presence of viral mutation(s) within the areas targeted by this assay, and inadequate number of viral copies(<138 copies/mL). A negative result must be combined with clinical observations, patient history, and epidemiological information. The expected result is Negative.  Fact Sheet for Patients:  EntrepreneurPulse.com.au  Fact Sheet for Healthcare Providers:  IncredibleEmployment.be  This test is no t yet approved or cleared by the [place] FDA and  has been authorized for detection and/or diagnosis of SARS-CoV-2 by FDA under an Emergency Use Authorization (EUA). This EUA will remain  in effect (meaning this test can be used) for the duration of the COVID-19 declaration under Section 564(b)(1) of the Act, 21 U.S.C.section 360bbb-3(b)(1), unless the authorization is terminated  or revoked sooner.       Influenza A by PCR NEGATIVE NEGATIVE Final   Influenza B by PCR NEGATIVE NEGATIVE Final    Comment: (NOTE) The Xpert Xpress SARS-CoV-2/FLU/RSV plus assay is intended as an aid in the diagnosis of influenza from Nasopharyngeal swab specimens and should not be used as a sole  basis for treatment. Nasal washings and aspirates are unacceptable for Xpert Xpress SARS-CoV-2/FLU/RSV testing.  Fact Sheet for Patients: EntrepreneurPulse.com.au  Fact Sheet for Healthcare Providers: IncredibleEmployment.be  This test is not yet approved or cleared by the [place] FDA and has been authorized for detection and/or diagnosis of SARS-CoV-2 by FDA under an Emergency Use Authorization (EUA). This EUA will remain in effect (meaning this test can be used) for the duration of the COVID-19 declaration under Section 564(b)(1) of the Act, 21 U.S.C. section 360bbb-3(b)(1), unless the authorization is terminated or revoked.  Performed at [facility], [place] [address] [place], [place] [address]    Surgical pcr screen     Status: Abnormal   Collection Time: 08/21/21 11:10 AM   Specimen: Nasal Mucosa; Nasal Swab  Result Value Ref Range Status   MRSA, PCR NEGATIVE NEGATIVE Final   Staphylococcus aureus POSITIVE (A) NEGATIVE Final    Comment: (NOTE) The Xpert SA Assay (FDA approved for NASAL specimens in patients 17 years of age and older), is one component of a comprehensive surveillance program. It is not intended to diagnose infection nor to guide or monitor treatment. Performed at [facility], [place] [address] [place], [place] [address]           Radiology Studies: DG Ankle 2 Views Left  Result Date: 08/22/2021 CLINICAL DATA:  Fracture distal left tibia and fibula EXAM: LEFT ANKLE - 2 VIEW COMPARISON:  08/20/2021 FINDINGS: Fluoroscopic images show internal fixation of fractures of distal left tibia and fibula with metallic side plates and surgical screws. Fluoroscopic time was 103 seconds. Radiation dose is 4.49 mGy. IMPRESSION: Fluoroscopic assistance was provided for internal fixation of fractures of distal left tibia and fibula. Electronically Signed   By: Elmer Picker M.D.   On: 08/22/2021 13:04   DG C-Arm 1-60 Min-No Report  Result Date: 08/22/2021 Fluoroscopy was utilized by the requesting physician.  No radiographic interpretation.   DG C-Arm 1-60 Min-No Report  Result Date: 08/22/2021 Fluoroscopy was utilized by the requesting physician.  No radiographic interpretation.    Scheduled Meds:  calcium-vitamin D  1 tablet Oral Q breakfast   Chlorhexidine Gluconate Cloth  6 each Topical Q0600   cholecalciferol  5,000 Units Oral Daily   enoxaparin (LOVENOX) injection  40 mg Subcutaneous Q24H   fluorometholone  1 drop Right Eye q AM   mupirocin ointment  1 application Nasal BID   pantoprazole  40 mg Oral Daily   predniSONE  0.5 mg Oral Q breakfast   senna-docusate  1 tablet Oral BID   timolol  1 drop Right Eye Daily   valACYclovir  1,000 mg Oral  QODAY   vitamin B-12  5,000 mcg Oral Daily    LOS: 3 days   Time spent: 17min  Braycen Burandt C Chandria Rookstool, DO [facility]  If 7PM-7AM, please contact night-coverage [email or web address]  08/23/2021, 7:45 AM

## 2021-08-23 NOTE — Progress Notes (Signed)
Patient refused her prednisone this morning. Patient educated on importance of taking the prescribed medication. MD made aware.

## 2021-08-24 DIAGNOSIS — W19XXXA Unspecified fall, initial encounter: Secondary | ICD-10-CM | POA: Diagnosis not present

## 2021-08-24 DIAGNOSIS — Z853 Personal history of malignant neoplasm of breast: Secondary | ICD-10-CM | POA: Diagnosis not present

## 2021-08-24 DIAGNOSIS — S82892A Other fracture of left lower leg, initial encounter for closed fracture: Secondary | ICD-10-CM | POA: Diagnosis not present

## 2021-08-24 DIAGNOSIS — K219 Gastro-esophageal reflux disease without esophagitis: Secondary | ICD-10-CM | POA: Diagnosis not present

## 2021-08-24 LAB — BASIC METABOLIC PANEL
Anion gap: 7 (ref 5–15)
BUN: 20 mg/dL (ref 8–23)
CO2: 30 mmol/L (ref 22–32)
Calcium: 9 mg/dL (ref 8.9–10.3)
Chloride: 99 mmol/L (ref 98–111)
Creatinine, Ser: 0.89 mg/dL (ref 0.44–1.00)
GFR, Estimated: >60 mL/min (ref >60)
Glucose, Bld: 128 mg/dL — ABNORMAL HIGH (ref 70–99)
Potassium: 4 mmol/L (ref 3.5–5.1)
Sodium: 136 mmol/L (ref 135–145)

## 2021-08-24 LAB — CBC
HCT: 33 % — ABNORMAL LOW (ref 36.0–46.0)
Hemoglobin: 10.4 g/dL — ABNORMAL LOW (ref 12.0–15.0)
MCH: 30.3 pg (ref 26.0–34.0)
MCHC: 31.5 g/dL (ref 30.0–36.0)
MCV: 96.2 fL (ref 80.0–100.0)
Platelets: 232 10*3/uL (ref 150–400)
RBC: 3.43 MIL/uL — ABNORMAL LOW (ref 3.87–5.11)
RDW: 13.6 % (ref 11.5–15.5)
WBC: 11.6 10*3/uL — ABNORMAL HIGH (ref 4.0–10.5)
nRBC: 0 % (ref 0.0–0.2)

## 2021-08-24 MED ORDER — HYDROMORPHONE HCL 1 MG/ML IJ SOLN
0.5000 mg | INTRAMUSCULAR | Status: DC | PRN
  Administered 2021-08-24 – 2021-08-26 (×7): 1 mg via INTRAVENOUS
  Filled 2021-08-24 (×8): qty 1

## 2021-08-24 MED ORDER — HYDROMORPHONE HCL 1 MG/ML IJ SOLN
0.5000 mg | INTRAMUSCULAR | Status: AC | PRN
  Administered 2021-08-24: 1 mg via INTRAVENOUS
  Filled 2021-08-24: qty 1

## 2021-08-24 NOTE — Progress Notes (Signed)
°  Mobility Specialist Criteria Algorithm Info.    08/24/21 1622  Pain Assessment  Pain Assessment 0-10  Pain Score 10  Faces Pain Scale 10  Pain Location L ankle  Pain Descriptors / Indicators Constant;Discomfort;Grimacing;Guarding;Moaning  Pain Intervention(s) Patient requesting pain meds-RN notified  Mobility  Activity Refused mobility   Patient refused due to 10/10 pain. RN present.  08/24/2021 4:23 PM  [place] Tavarus [place], [place], [place]  CKFWB:[phone] Office: [phone]

## 2021-08-24 NOTE — Progress Notes (Signed)
Patient refused her prednisone and valacyclovir because as per pt her insurance does not cover valacyclovir while she is in the hospital. Patient educated on importance of taking the medication. MD made aware.

## 2021-08-24 NOTE — Progress Notes (Signed)
, PROGRESS NOTE    [name]  HER:[id number] DOB: [birth year] DOA: 08/20/2021 PCP: Wardell Honour, MD   Brief Narrative:  [name] is a 76 y.o. 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 Orthopedics consulted.  Assessment & Plan:  Left ankle fracture secondary to mechanical slip/fall at home: Acute.   -Orthopedics following - s/p ORIF 08/22/21 - tolerated well -Increased oxycodone and Dilaudid over the next 72 hours given poorly controlled pain as nerve block wears off -Increased risk for ongoing fracture given chronic steroid use and possible underlying secondary osteoporosis - may benefit from bisphosphonate therapy - has been on previously but discontinued last year. 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   History of breast cancer: Diagnosed back in 2010 status post right lumpectomy and subsequent radiation.   GERD: Patient alternates between 20 and 40 mg of omeprazole every other day. -Pharmacy substitution of Protonix   DVT prophylaxis: Lovenox Code Status: Full Family Communication: None available  Status is: Inpatient  Dispo: The patient is from: Home              Anticipated  d/c is to: To be determined              Anticipated d/c date is: 48 to 72 hours              Patient currently not medically stable for discharge  Consultants:  Orthopedic surgery  Procedures:  Left ankle fracture repair per 08/23/21  Antimicrobials:  None indicated  Subjective: Acutely uncontrolled pain overnight after attempting to move ankle in bed, initiated on Dilaudid given uncontrolled pain otherwise denies nausea vomiting diarrhea constipation headache fevers chills or chest pain  Objective: Vitals:   08/23/21 1410 08/23/21 1738 08/23/21 2026 08/24/21 0243  BP: (!) 114/57 (!) 110/50 134/70 126/67  Pulse: 90 74 98 94  Resp:  20 20 18   Temp:  98.7 F (37.1 C) 98.1 F (36.7 C) 97.8 F (36.6 C)  TempSrc:  Oral Oral Oral  SpO2: 99% 96% 100% 99%  Weight:      Height:        Intake/Output Summary (Last 24 hours) at 08/24/2021 0756 Last data filed at 08/23/2021 1415 Gross per 24 hour  Intake 150 ml  Output --  Net 150 ml    Filed Weights   08/22/21 0953  Weight: 117 kg    Examination:  General:  Pleasantly resting in bed, No acute distress. HEENT:  Normocephalic atraumatic.  Sclerae nonicteric, noninjected.  Extraocular movements intact bilaterally. Neck:  Without mass or deformity.  Trachea is midline. Lungs:  Clear to auscultate bilaterally without rhonchi, wheeze, or rales. Heart:  Regular rate and rhythm.  Without murmurs, rubs, or gallops. Abdomen:  Soft,  nontender, nondistended.  Without guarding or rebound. Extremities: Left foot bandage/wrap clean dry intact  Data Reviewed: I have personally reviewed following labs and imaging studies  CBC: Recent Labs  Lab 08/20/21 1351 08/21/21 0143 08/24/21 0629  WBC 9.2 11.0* 11.6*  NEUTROABS 7.0  --   --   HGB 11.8* 12.9 10.4*  HCT 37.8 39.5 33.0*  MCV 96.9 94.7 96.2  PLT 200 226 572    Basic Metabolic Panel: Recent Labs  Lab 08/20/21 1351 08/21/21 0143 08/24/21 0629  NA 137 138 136  K 5.0 4.1  4.0  CL 103 101 99  CO2 25 26 30   GLUCOSE 114* 119* 128*  BUN 17 12 20   CREATININE 0.54 0.59 0.89  CALCIUM 8.9 9.7 9.0    GFR: Estimated Creatinine Clearance: 67.6 mL/min (by C-G formula based on SCr of 0.89 mg/dL). Liver Function Tests: No results for input(s): AST, ALT, ALKPHOS, BILITOT, PROT, ALBUMIN in the last 168 hours. No results for input(s): LIPASE, AMYLASE in the last 168 hours. No results for input(s): AMMONIA in the last 168 hours. Coagulation Profile: No results for input(s): INR, PROTIME in the last 168 hours. Cardiac Enzymes: No results for input(s): CKTOTAL, CKMB, CKMBINDEX, TROPONINI in the last 168 hours. BNP (last 3 results) No results for input(s): PROBNP in the last 8760 hours. HbA1C: No results for input(s): HGBA1C in the last 72 hours. CBG: No results for input(s): GLUCAP in the last 168 hours. Lipid Profile: No results for input(s): CHOL, HDL, LDLCALC, TRIG, CHOLHDL, LDLDIRECT in the last 72 hours. Thyroid Function Tests: No results for input(s): TSH, T4TOTAL, FREET4, T3FREE, THYROIDAB in the last 72 hours. Anemia Panel: No results for input(s): VITAMINB12, FOLATE, FERRITIN, TIBC, IRON, RETICCTPCT in the last 72 hours. Sepsis Labs: No results for input(s): PROCALCITON, LATICACIDVEN in the last 168 hours.  Recent Results (from the past 240 hour(s))  Resp Panel by RT-PCR (Flu A&B, Covid) Nasopharyngeal Swab     Status: None   Collection Time: 08/20/21  2:02 PM   Specimen: Nasopharyngeal Swab; Nasopharyngeal(NP) swabs in vial transport medium  Result Value Ref Range Status   SARS Coronavirus 2 by RT PCR NEGATIVE NEGATIVE Final    Comment: (NOTE) SARS-CoV-2 target nucleic acids are NOT DETECTED.  The SARS-CoV-2 RNA is generally detectable in upper respiratory specimens during the acute phase of infection. The lowest concentration of SARS-CoV-2 viral copies this assay can detect is 138 copies/mL. A negative result does not preclude  SARS-Cov-2 infection and should not be used as the sole basis for treatment or other patient management decisions. A negative result may occur with  improper specimen collection/handling, submission of specimen other than nasopharyngeal swab, presence of viral mutation(s) within the areas targeted by this assay, and inadequate number of viral copies(<138 copies/mL). A negative result must be combined with clinical observations, patient history, and epidemiological information. The expected result is Negative.  Fact Sheet for Patients:  EntrepreneurPulse.com.au  Fact Sheet for Healthcare Providers:  IncredibleEmployment.be  This test is no t yet approved or cleared by the [place] FDA and  has been authorized for detection and/or diagnosis of SARS-CoV-2 by FDA under an Emergency Use Authorization (EUA). This EUA will remain  in effect (meaning this test can be used) for the duration of the COVID-19 declaration under Section 564(b)(1) of the Act, 21 U.S.C.section 360bbb-3(b)(1), unless the authorization is terminated  or revoked sooner.       Influenza A by PCR NEGATIVE NEGATIVE Final   Influenza B  by PCR NEGATIVE NEGATIVE Final    Comment: (NOTE) The Xpert Xpress SARS-CoV-2/FLU/RSV plus assay is intended as an aid in the diagnosis of influenza from Nasopharyngeal swab specimens and should not be used as a sole basis for treatment. Nasal washings and aspirates are unacceptable for Xpert Xpress SARS-CoV-2/FLU/RSV testing.  Fact Sheet for Patients: EntrepreneurPulse.com.au  Fact Sheet for Healthcare Providers: IncredibleEmployment.be  This test is not yet approved or cleared by the [place] FDA and has been authorized for detection and/or diagnosis of SARS-CoV-2 by FDA under an Emergency Use Authorization (EUA). This EUA will remain in effect (meaning this test can be used) for the duration of  the COVID-19 declaration under Section 564(b)(1) of the Act, 21 U.S.C. section 360bbb-3(b)(1), unless the authorization is terminated or revoked.  Performed at [facility], [place] [address] [place], [place] [address]   Surgical pcr screen     Status: Abnormal   Collection Time: 08/21/21 11:10 AM   Specimen: Nasal Mucosa; Nasal Swab  Result Value Ref Range Status   MRSA, PCR NEGATIVE NEGATIVE Final   Staphylococcus aureus POSITIVE (A) NEGATIVE Final    Comment: (NOTE) The Xpert SA Assay (FDA approved for NASAL specimens in patients 21 years of age and older), is one component of a comprehensive surveillance program. It is not intended to diagnose infection nor to guide or monitor treatment. Performed at [facility], [place] [address], [place], [place] [address]           Radiology Studies: DG Ankle 2 Views Left  Result Date: 08/22/2021 CLINICAL DATA:  Fracture distal left tibia and fibula EXAM: LEFT ANKLE - 2 VIEW COMPARISON:  08/20/2021 FINDINGS: Fluoroscopic images show internal fixation of fractures of distal left tibia and fibula with metallic side plates and surgical screws. Fluoroscopic time was 103 seconds. Radiation dose is 4.49 mGy. IMPRESSION: Fluoroscopic assistance was provided for internal fixation of fractures of distal left tibia and fibula. Electronically Signed   By: Elmer Picker M.D.   On: 08/22/2021 13:04   DG C-Arm 1-60 Min-No Report  Result Date: 08/22/2021 Fluoroscopy was utilized by the requesting physician.  No radiographic interpretation.   DG C-Arm 1-60 Min-No Report  Result Date: 08/22/2021 Fluoroscopy was utilized by the requesting physician.  No radiographic interpretation.    Scheduled Meds:  calcium-vitamin D  1 tablet Oral Q breakfast   Chlorhexidine Gluconate Cloth  6 each Topical Q0600   cholecalciferol  5,000 Units Oral Daily   enoxaparin (LOVENOX) injection  40 mg Subcutaneous Q24H   fluorometholone  1 drop Right Eye q  AM   mupirocin ointment  1 application Nasal BID   pantoprazole  40 mg Oral Daily   polyethylene glycol  17 g Oral BID   predniSONE  0.5 mg Oral Q breakfast   senna-docusate  1 tablet Oral BID   timolol  1 drop Right Eye Daily   valACYclovir  1,000 mg Oral QODAY   vitamin B-12  5,000 mcg Oral Daily    LOS: 4 days   Time spent: 76min  Robyne Matar C Violeta Lecount, DO [facility]  If 7PM-7AM, please contact night-coverage [email or web address]  08/24/2021, 7:56 AM

## 2021-08-24 NOTE — Care Management Important Message (Signed)
Important Message  Patient Details  Name: [name] MRN: [id number] Date of Birth: 1945/09/01   Medicare Important Message Given:  Yes     Hannah Beat 08/24/2021, 12:08 PM

## 2021-08-24 NOTE — Plan of Care (Signed)
°  Problem: Education: Goal: Knowledge of the prescribed therapeutic regimen will improve Outcome: Progressing   Problem: Activity: Goal: Ability to increase mobility will improve Outcome: Progressing   Problem: Physical Regulation: Goal: Postoperative complications will be avoided or minimized Outcome: Progressing   Problem: Pain Management: Goal: Pain level will decrease with appropriate interventions Outcome: Progressing

## 2021-08-24 NOTE — Evaluation (Signed)
Occupational Therapy Evaluation Patient Details Name: [name] MRN: [id number] DOB: [birth year] Today's Date: 08/24/2021   History of Present Illness Pt is a 76 y.o. F who presents 08/20/2021 after a left ankle fracture after a fall now s/p ORIF 1/28. Significant PMH: DM2, melanoma s/p immunotherapy, breast CA s/p lumpectomy and radiation.   Clinical Impression   Pt was walking with a cane and functioning modified independently in ADL and IADL. She was driving. Pt presents with significant pain this morning and was unable to tolerate L LE in dependent position to sit EOB without propping L LE. Did not attempt to stand. She needs moderate assistance for bed mobility and set up to total assist for ADL. Pt will need post acute rehab in SNF prior to return home.     Recommendations for follow up therapy are one component of a multi-disciplinary discharge planning process, led by the attending physician.  Recommendations may be updated based on patient status, additional functional criteria and insurance authorization.   Follow Up Recommendations  Skilled nursing-short term rehab (<3 hours/day)    Assistance Recommended at Discharge Frequent or constant Supervision/Assistance  Patient can return home with the following Two people to help with walking and/or transfers;Assist for transportation;Help with stairs or ramp for entrance;Two people to help with bathing/dressing/bathroom    Functional Status Assessment  Patient has had a recent decline in their functional status and demonstrates the ability to make significant improvements in function in a reasonable and predictable amount of time.  Equipment Recommendations  Wheelchair (measurements OT);Wheelchair cushion (measurements OT) (drop arm commode, bariatric)    Recommendations for Other Services       Precautions / Restrictions Precautions Precautions: Fall Restrictions Weight Bearing Restrictions: Yes LLE Weight Bearing: Non  weight bearing      Mobility Bed Mobility Overal bed mobility: Needs Assistance Bed Mobility: Rolling, Supine to Sit, Sit to Supine Rolling: Mod assist   Supine to sit: Min assist Sit to supine: Mod assist   General bed mobility comments: assist for L LE, unable to tolerate L LE in dependent position    Transfers                   General transfer comment: deferred due to pain      Balance Overall balance assessment: Needs assistance   Sitting balance-Leahy Scale: Fair                                     ADL either performed or assessed with clinical judgement   ADL Overall ADL's : Needs assistance/impaired Eating/Feeding: Independent;Bed level   Grooming: Wash/dry hands;Wash/dry face;Bed level;Set up   Upper Body Bathing: Moderate assistance;Sitting   Lower Body Bathing: Total assistance;Bed level;+2 for physical assistance   Upper Body Dressing : Minimal assistance;Sitting   Lower Body Dressing: Total assistance;Bed level;+2 for physical assistance       Toileting- Clothing Manipulation and Hygiene: Total assistance;+2 for physical assistance;Bed level               Vision Ability to See in Adequate Light: 0 Adequate Patient Visual Report: No change from baseline       Perception     Praxis      Pertinent Vitals/Pain Pain Assessment Pain Assessment: Faces Faces Pain Scale: Hurts whole lot Pain Location: L ankle Pain Descriptors / Indicators: Grimacing, Guarding, Aching Pain Intervention(s): Monitored during session, Repositioned, RN  gave pain meds during session     Hand Dominance Right   Extremity/Trunk Assessment Upper Extremity Assessment Upper Extremity Assessment: Overall WFL for tasks assessed   Lower Extremity Assessment Lower Extremity Assessment: Defer to PT evaluation   Cervical / Trunk Assessment Cervical / Trunk Assessment: Other exceptions Cervical / Trunk Exceptions: large body habitus    Communication Communication Communication: No difficulties   Cognition Arousal/Alertness: Awake/alert Behavior During Therapy: WFL for tasks assessed/performed Overall Cognitive Status: Within Functional Limits for tasks assessed                                       General Comments       Exercises     Shoulder Instructions      Home Living Family/patient expects to be discharged to:: Private residence Living Arrangements: Alone Available Help at Discharge: Family;Available PRN/intermittently Type of Home: House Home Access: Ramped entrance     Home Layout: One level     Bathroom Shower/Tub: Occupational psychologist: Standard     Home Equipment: Public relations account executive (2 wheels);Transport chair          Prior Functioning/Environment Prior Level of Function : Independent/Modified Independent             Mobility Comments: walking with a cane          OT Problem List: Decreased strength;Impaired balance (sitting and/or standing);Pain;Decreased knowledge of use of DME or AE;Obesity      OT Treatment/Interventions: Self-care/ADL training;Patient/family education;Balance training;Therapeutic activities;DME and/or AE instruction    OT Goals(Current goals can be found in the care plan section) Acute Rehab OT Goals OT Goal Formulation: With patient Time For Goal Achievement: 09/07/21 Potential to Achieve Goals: Good ADL Goals Pt Will Perform Grooming: with set-up;sitting Pt Will Perform Lower Body Bathing: with mod assist;sitting/lateral leans Pt Will Perform Lower Body Dressing: with mod assist;with adaptive equipment;sitting/lateral leans Pt Will Transfer to Toilet: with min assist;bedside commode;with transfer board Pt Will Perform Toileting - Clothing Manipulation and hygiene: with mod assist;sitting/lateral leans Additional ADL Goal #1: Pt will perform bed mobility with min guard assist and leg lifter as needed.  OT  Frequency: Min 2X/week    Co-evaluation              AM-PAC OT "6 Clicks" Daily Activity     Outcome Measure Help from another person eating meals?: None Help from another person taking care of personal grooming?: A Little Help from another person toileting, which includes using toliet, bedpan, or urinal?: Total Help from another person bathing (including washing, rinsing, drying)?: A Lot Help from another person to put on and taking off regular upper body clothing?: A Little Help from another person to put on and taking off regular lower body clothing?: Total 6 Click Score: 14   End of Session    Activity Tolerance: Patient limited by pain Patient left: in bed;with call bell/phone within reach  OT Visit Diagnosis: Pain;Muscle weakness (generalized) (M62.81) Pain - Right/Left: Left Pain - part of body: Ankle and joints of foot                Time: 6270-3500 OT Time Calculation (min): 17 min Charges:  OT General Charges $OT Visit: 1 Visit OT Evaluation $OT Eval Moderate Complexity: 1 Mod  Nestor Lewandowsky, OTR/L Acute Rehabilitation Services Pager: [phone] Office: [phone]  Malka So 08/24/2021, 9:22 AM

## 2021-08-25 ENCOUNTER — Encounter (HOSPITAL_COMMUNITY): Payer: Self-pay | Admitting: Orthopaedic Surgery

## 2021-08-25 DIAGNOSIS — K219 Gastro-esophageal reflux disease without esophagitis: Secondary | ICD-10-CM | POA: Diagnosis not present

## 2021-08-25 DIAGNOSIS — S82892A Other fracture of left lower leg, initial encounter for closed fracture: Secondary | ICD-10-CM | POA: Diagnosis not present

## 2021-08-25 DIAGNOSIS — Z853 Personal history of malignant neoplasm of breast: Secondary | ICD-10-CM | POA: Diagnosis not present

## 2021-08-25 DIAGNOSIS — W19XXXA Unspecified fall, initial encounter: Secondary | ICD-10-CM | POA: Diagnosis not present

## 2021-08-25 NOTE — Progress Notes (Signed)
Physical Therapy Treatment Patient Details Name: [name] MRN: [id number] DOB: Aug 12, 1945 Today's Date: 08/25/2021   History of Present Illness Pt is a 76 y.o. female admitted 08/20/21 after fall sustaining L ankle fx. S/p L trimalleolar ankle ORIF on 1/28. PMH includes DM2, HLD, breast CA, melanoma s/p immunotherapy, polymyalgia rheumatica on chronic steroids, bilateral THA.   PT Comments    Pt progressing with mobility; unable to progress to standing with assist+1 this session, but demonstrates improved tolerance to LLE in dependent position with seated activity. Pt remains limited by pain, generalized weakness, decreased activity tolerance, and impaired balance strategies/postural reactions. Continue to recommend SNF-level therapies to maximize functional mobility and independence prior to return home.    Recommendations for follow up therapy are one component of a multi-disciplinary discharge planning process, led by the attending physician.  Recommendations may be updated based on patient status, additional functional criteria and insurance authorization.  Follow Up Recommendations  Skilled nursing-short term rehab (<3 hours/day)     Assistance Recommended at Discharge Intermittent Supervision/Assistance  Patient can return home with the following A lot of help with walking and/or transfers;A lot of help with bathing/dressing/bathroom   Equipment Recommendations  BSC/3in1;Wheelchair (measurements PT);Wheelchair cushion (measurements PT) (bariatric)    Recommendations for Other Services       Precautions / Restrictions Precautions Precautions: Fall Restrictions Weight Bearing Restrictions: Yes LLE Weight Bearing: Non weight bearing     Mobility  Bed Mobility Overal bed mobility: Needs Assistance Bed Mobility: Rolling, Supine to Sit, Sit to Supine Rolling: Supervision   Supine to sit: Min assist, HOB elevated Sit to supine: Mod assist   General bed mobility comments:  heavy use of bed rail to roll R/L for bed pain removal and bed pad change; minA for LLE management to sit EOB; modA for LE management return to supine; pt able to scoot self up in bed with use of bed rails    Transfers Overall transfer level: Needs assistance Equipment used: Rolling walker (2 wheels)               General transfer comment: Unable to stand from elevated bed height with RW and assist+1; performed 3x trials with partial clearance of buttocks from EOB, but unable to extend to upright while maintaining LLE NWB. pt initiating lateral scoots at EOB well with modA to assist hips with bed pad, reports increased nausea with this requiring return to supine    Ambulation/Gait                   Stairs             Wheelchair Mobility    Modified Rankin (Stroke Patients Only)       Balance Overall balance assessment: Needs assistance Sitting-balance support: Feet supported Sitting balance-Leahy Scale: Fair                                      Cognition Arousal/Alertness: Awake/alert Behavior During Therapy: WFL for tasks assessed/performed Overall Cognitive Status: Within Functional Limits for tasks assessed                                          Exercises General Exercises - Lower Extremity Straight Leg Raises: AROM, Left, Supine    General Comments General comments (skin integrity,  edema, etc.): increased time discussing mobility progression options since pt reports, "I really just don't think I'm going to be able to hop on one leg with the walker since the right leg is my weak side and I do not like that walker" (pt reports only other fall was when she used a walker); educ that next option would likely be squat pivot or scooting transfers on RLE at w/c-level, then hoyer lift. Premedicated with dilaudid (~11:45A) and oxy (~1:45P), still very limited by pt c/o pain      Pertinent Vitals/Pain Pain Assessment Pain  Assessment: Faces Faces Pain Scale: Hurts whole lot Pain Location: LLE Pain Descriptors / Indicators: Radiating Pain Intervention(s): Premedicated before session, Repositioned, Limited activity within patient's tolerance    Home Living                          Prior Function            PT Goals (current goals can now be found in the care plan section) Progress towards PT goals: Progressing toward goals    Frequency    Min 3X/week      PT Plan Current plan remains appropriate    Co-evaluation              AM-PAC PT "6 Clicks" Mobility   Outcome Measure  Help needed turning from your back to your side while in a flat bed without using bedrails?: A Little Help needed moving from lying on your back to sitting on the side of a flat bed without using bedrails?: A Lot Help needed moving to and from a bed to a chair (including a wheelchair)?: Total Help needed standing up from a chair using your arms (e.g., wheelchair or bedside chair)?: Total Help needed to walk in hospital room?: Total Help needed climbing 3-5 steps with a railing? : Total 6 Click Score: 9    End of Session Equipment Utilized During Treatment: Gait belt Activity Tolerance: Patient tolerated treatment well;Patient limited by pain Patient left: in bed;with call bell/phone within reach;with bed alarm set Nurse Communication: Mobility status;Need for lift equipment PT Visit Diagnosis: Unsteadiness on feet (R26.81);Other abnormalities of gait and mobility (R26.89);Muscle weakness (generalized) (M62.81);Difficulty in walking, not elsewhere classified (R26.2)     Time: 3254-9826 PT Time Calculation (min) (ACUTE ONLY): 30 min  Charges:  $Therapeutic Activity: 23-37 mins                     Mabeline Caras, PT, DPT Acute Rehabilitation Services  Pager [phone] Office [phone]  Derry Lory 08/25/2021, 3:22 PM

## 2021-08-25 NOTE — Progress Notes (Signed)
, PROGRESS NOTE    [name]  VOH:[id number] DOB: October 27, 1945 DOA: 08/20/2021 PCP: Wardell Honour, MD   Brief Narrative:  [name] is a 76 y.o. 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 Orthopedics consulted.  Assessment & Plan:  Left ankle fracture secondary to mechanical slip/fall at home: Acute.   -Orthopedics following - s/p ORIF 08/22/21 - tolerated well -Increased oxycodone and Dilaudid for an additional 24 hours given poorly controlled pain as nerve block wears off and patient increase his activity with PT -Increased risk for ongoing fracture given chronic steroid use and possible underlying secondary osteoporosis - may benefit from bisphosphonate therapy - has been on previously but discontinued last year. -PT OT to follow tentative plan discharge to SNF given patient's instability and independent living situation without any support  Malignant melanoma of the right upper extremity:  - Follows at [facility] for surveillance; repeat imaging per their office   PMR on chronic steroids:  - Slowly weaning off steroids - was on 0.5mg  at intake - have decided to stop steroids while here - Likely the etiology of her ankle fracture and bilateral hip replacements.   History of breast cancer: Diagnosed back in 2010 status post right lumpectomy and subsequent radiation.   GERD: Patient alternates between 20 and 40 mg of omeprazole every other day. -Pharmacy substitution of Protonix   DVT prophylaxis: Lovenox Code Status: Full Family Communication: None  available  Status is: Inpatient  Dispo: The patient is from: Home              Anticipated d/c is to: To be determined              Anticipated d/c date is: 24-48 hours              Patient currently not medically stable for discharge  Consultants:  Orthopedic surgery  Procedures:  Left ankle fracture repair 08/23/21  Antimicrobials:  None indicated  Subjective: No acute issues or events overnight, patient's pain is moderately more well controlled today while at rest, continues to be worse with motion and physical therapy  Objective: Vitals:   08/24/21 0243 08/24/21 0828 08/24/21 2135 08/25/21 0553  BP: 126/67 138/79 126/68 121/67  Pulse: 94 96 100 97  Resp: 18 18 18 18   Temp: 97.8 F (36.6 C) 98.2 F (36.8 C) 99.1 F (37.3 C) 99.1 F (37.3 C)  TempSrc: Oral Oral Oral Oral  SpO2: 99% 98% 98% 97%  Weight:      Height:        Intake/Output Summary (Last 24 hours) at 08/25/2021 0745 Last data filed at 08/24/2021 1010 Gross per 24 hour  Intake 150 ml  Output --  Net 150 ml    Filed Weights   08/22/21 0953  Weight: 117 kg    Examination:  General:  Pleasantly resting in bed, No acute distress. HEENT:  Normocephalic atraumatic.  Sclerae nonicteric, noninjected.  Extraocular movements intact bilaterally. Neck:  Without mass or deformity.  Trachea is midline. Lungs:  Clear to auscultate bilaterally without rhonchi, wheeze, or rales. Heart:  Regular rate and rhythm.  Without murmurs, rubs, or gallops. Abdomen:  Soft, nontender, nondistended.  Without guarding or rebound. Extremities: Left foot bandage/wrap clean dry intact  Data Reviewed: I have personally reviewed following labs and imaging studies  CBC: Recent Labs  Lab 08/20/21 1351 08/21/21 0143 08/24/21 0629  WBC 9.2 11.0* 11.6*  NEUTROABS 7.0  --   --   HGB 11.8* 12.9 10.4*  HCT 37.8 39.5 33.0*  MCV 96.9 94.7 96.2  PLT 200 226 417    Basic Metabolic Panel: Recent Labs  Lab 08/20/21 1351  08/21/21 0143 08/24/21 0629  NA 137 138 136  K 5.0 4.1 4.0  CL 103 101 99  CO2 25 26 30   GLUCOSE 114* 119* 128*  BUN 17 12 20   CREATININE 0.54 0.59 0.89  CALCIUM 8.9 9.7 9.0    GFR: Estimated Creatinine Clearance: 67.6 mL/min (by C-G formula based on SCr of 0.89 mg/dL). Liver Function Tests: No results for input(s): AST, ALT, ALKPHOS, BILITOT, PROT, ALBUMIN in the last 168 hours. No results for input(s): LIPASE, AMYLASE in the last 168 hours. No results for input(s): AMMONIA in the last 168 hours. Coagulation Profile: No results for input(s): INR, PROTIME in the last 168 hours. Cardiac Enzymes: No results for input(s): CKTOTAL, CKMB, CKMBINDEX, TROPONINI in the last 168 hours. BNP (last 3 results) No results for input(s): PROBNP in the last 8760 hours. HbA1C: No results for input(s): HGBA1C in the last 72 hours. CBG: No results for input(s): GLUCAP in the last 168 hours. Lipid Profile: No results for input(s): CHOL, HDL, LDLCALC, TRIG, CHOLHDL, LDLDIRECT in the last 72 hours. Thyroid Function Tests: No results for input(s): TSH, T4TOTAL, FREET4, T3FREE, THYROIDAB in the last 72 hours. Anemia Panel: No results for input(s): VITAMINB12, FOLATE, FERRITIN, TIBC, IRON, RETICCTPCT in the last 72 hours. Sepsis Labs: No results for input(s): PROCALCITON, LATICACIDVEN in the last 168 hours.  Recent Results (from the past 240 hour(s))  Resp Panel by RT-PCR (Flu A&B, Covid) Nasopharyngeal Swab     Status: None   Collection Time: 08/20/21  2:02 PM   Specimen: Nasopharyngeal Swab; Nasopharyngeal(NP) swabs in vial transport medium  Result Value Ref Range Status   SARS Coronavirus 2 by RT PCR NEGATIVE NEGATIVE Final    Comment: (NOTE) SARS-CoV-2 target nucleic acids are NOT DETECTED.  The SARS-CoV-2 RNA is generally detectable in upper respiratory specimens during the acute phase of infection. The lowest concentration of SARS-CoV-2 viral copies this assay can detect is 138  copies/mL. A negative result does not preclude SARS-Cov-2 infection and should not be used as the sole basis for treatment or other patient management decisions. A negative result may occur with  improper specimen collection/handling, submission of specimen other than nasopharyngeal swab, presence of viral mutation(s) within the areas targeted by this assay, and inadequate number of viral copies(<138 copies/mL). A negative result must be combined with clinical observations, patient history, and epidemiological information. The expected result is Negative.  Fact Sheet for Patients:  EntrepreneurPulse.com.au  Fact Sheet for Healthcare Providers:  IncredibleEmployment.be  This test is no t yet approved or cleared by the [place] FDA and  has been authorized for detection and/or diagnosis of SARS-CoV-2 by FDA under an Emergency Use Authorization (EUA). This EUA will remain  in effect (meaning this test can be used) for the duration of the COVID-19 declaration under Section 564(b)(1) of the Act, 21 U.S.C.section 360bbb-3(b)(1), unless the authorization is terminated  or revoked sooner.  Influenza A by PCR NEGATIVE NEGATIVE Final   Influenza B by PCR NEGATIVE NEGATIVE Final    Comment: (NOTE) The Xpert Xpress SARS-CoV-2/FLU/RSV plus assay is intended as an aid in the diagnosis of influenza from Nasopharyngeal swab specimens and should not be used as a sole basis for treatment. Nasal washings and aspirates are unacceptable for Xpert Xpress SARS-CoV-2/FLU/RSV testing.  Fact Sheet for Patients: EntrepreneurPulse.com.au  Fact Sheet for Healthcare Providers: IncredibleEmployment.be  This test is not yet approved or cleared by the [place] FDA and has been authorized for detection and/or diagnosis of SARS-CoV-2 by FDA under an Emergency Use Authorization (EUA). This EUA will remain in effect (meaning  this test can be used) for the duration of the COVID-19 declaration under Section 564(b)(1) of the Act, 21 U.S.C. section 360bbb-3(b)(1), unless the authorization is terminated or revoked.  Performed at [facility], [place] [address] [place], [place] [address]   Surgical pcr screen     Status: Abnormal   Collection Time: 08/21/21 11:10 AM   Specimen: Nasal Mucosa; Nasal Swab  Result Value Ref Range Status   MRSA, PCR NEGATIVE NEGATIVE Final   Staphylococcus aureus POSITIVE (A) NEGATIVE Final    Comment: (NOTE) The Xpert SA Assay (FDA approved for NASAL specimens in patients 64 years of age and older), is one component of a comprehensive surveillance program. It is not intended to diagnose infection nor to guide or monitor treatment. Performed at [facility], [place] [address] [place], [place] [address]           Radiology Studies: No results found.  Scheduled Meds:  calcium-vitamin D  1 tablet Oral Q breakfast   Chlorhexidine Gluconate Cloth  6 each Topical Q0600   cholecalciferol  5,000 Units Oral Daily   enoxaparin (LOVENOX) injection  40 mg Subcutaneous Q24H   fluorometholone  1 drop Right Eye q AM   mupirocin ointment  1 application Nasal BID   pantoprazole  40 mg Oral Daily   polyethylene glycol  17 g Oral BID   predniSONE  0.5 mg Oral Q breakfast   senna-docusate  1 tablet Oral BID   timolol  1 drop Right Eye Daily   valACYclovir  1,000 mg Oral QODAY   vitamin B-12  5,000 mcg Oral Daily    LOS: 5 days   Time spent: 45min  Seleny Allbright C Latish Toutant, DO [facility]  If 7PM-7AM, please contact night-coverage [email or web address]  08/25/2021, 7:45 AM

## 2021-08-25 NOTE — Plan of Care (Signed)
°  Problem: Education: Goal: Knowledge of the prescribed therapeutic regimen will improve Outcome: Progressing   Problem: Activity: Goal: Ability to increase mobility will improve Outcome: Progressing   Problem: Education: Goal: Knowledge of General Education information will improve Description: Including pain rating scale, medication(s)/side effects and non-pharmacologic comfort measures Outcome: Progressing   Problem: Health Behavior/Discharge Planning: Goal: Ability to manage health-related needs will improve Outcome: Progressing

## 2021-08-25 NOTE — NC FL2 (Signed)
[place] LEVEL OF CARE SCREENING TOOL     IDENTIFICATION  Patient Name: [name] Birthdate: 05-11-1946 Sex: female Admission Date (Current Location): 08/20/2021  [facility] and [place] Number:  Herbalist and Address:  [facility], [place] [address], [place], [place] [address]      Provider Number: [id number]  Attending Physician Name and Address:  Little Ishikawa, MD  Relative Name and Phone Number:  [name], [name] [phone]    Current Level of Care: Hospital Recommended Level of Care: [place] Prior Approval Number:    Date Approved/Denied:   PASRR Number: [id number]  Discharge Plan: SNF    Current Diagnoses: Patient Active Problem List   Diagnosis Date Noted   Fall at home, initial encounter 08/20/2021   Closed left ankle fracture 08/20/2021   Normocytic anemia 08/20/2021   History of breast cancer 08/20/2021   Uveitic glaucoma of right eye, moderate stage 08/08/2020   Pain and swelling of right lower leg 07/21/2020   Aftercare following joint replacement surgery 01/23/2020   Neurogenic claudication due to lumbar spinal stenosis 05/28/2019   Right groin pain 05/28/2019   Polymyalgia rheumatica ([place]) 10/09/2018   BMI 40.0-44.9, adult ([place]) 10/09/2018   Foraminal stenosis of lumbar region 05/11/2018   Spinal stenosis of lumbar region at multiple levels 05/11/2018   On prednisone therapy 02/14/2018   Herpes zoster 10/16/2017   Chronic pain of both shoulders 08/10/2017   Prediabetes 05/26/2017   Primary osteoarthritis of both hips 03/15/2017   Trochanteric bursitis of left hip 03/15/2017   Recurrent epistaxis 12/09/2016   Dysuria 11/03/2016   Insomnia 11/03/2016   Glaucoma 05/05/2016   LPRD (laryngopharyngeal reflux disease) 01/26/2016   Erosive esophagitis 11/27/2015   Schatzki's ring of distal esophagus 11/27/2015   Chronic sinusitis 10/16/2015   Deviated nasal septum  10/16/2015   Nasal polyp 10/16/2015   Osteoporosis 08/15/2015   Dysphagia, pharyngoesophageal phase 04/01/2015   Endometrial polyp 10/24/2014   Fatigue 07/09/2014   Hyperlipidemia    Breast cancer, right ([place]) 02/01/2014   Malignant melanoma of arm ([place]) 11/23/2013   Nasal septum ulceration 10/16/2013   Seborrheic keratosis 10/16/2013   Vitamin D deficiency 02/08/2013   Sciatica of right side 01/24/2013   Esophageal ulcer 01/24/2013   Esophageal stenosis 01/24/2013   Choroidal nevus of right eye 10/27/2012   Impacted cerumen of right ear 10/11/2012   Frequency 01/19/2012   Gastroesophageal reflux disease 09/01/2011   Horseshoe tear of retina without detachment 08/03/2011   Horseshoe retinal tear of right eye 08/03/2011   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   H/O cataract extraction 04/30/2011   Borderline glaucoma steroid responder 04/30/2011   Class 2 obesity due to excess calories without serious comorbidity with body mass index (BMI) of 39.0 to 39.9 in adult 10/16/2008   Breast cancer of lower-outer quadrant of right female breast ([place]) 08/29/2008    Orientation RESPIRATION BLADDER Height & Weight     Self, Time, Situation, Place  Normal Incontinent Weight: 258 lb (117 kg) Height:  5\' 4"  (162.6 cm)  BEHAVIORAL SYMPTOMS/MOOD NEUROLOGICAL BOWEL NUTRITION STATUS      Continent Diet  AMBULATORY STATUS COMMUNICATION OF NEEDS Skin   Total Care Verbally Surgical wounds                       Personal Care Assistance Level of Assistance  Bathing, Feeding, Dressing, Total care Bathing Assistance: Maximum assistance Feeding assistance: Independent Dressing Assistance:  Maximum assistance Total Care Assistance: Maximum assistance   Functional Limitations Info  Sight, Hearing, Speech Sight Info: Adequate Hearing Info: Adequate Speech Info: Adequate    SPECIAL CARE FACTORS FREQUENCY  PT (By licensed PT), OT (By licensed OT)     PT Frequency: 5x week OT Frequency: 5x  week            Contractures Contractures Info: Not present    Additional Factors Info  Code Status, Allergies Code Status Info: full Allergies Info: Adhesive (Tape)   Cymbalta (Duloxetine Hcl)   Gabapentin   Betimol (Timolol Maleate)   Cosopt (Dorzolamide Hcl-timolol Mal)   Penicillin G   Penicillins   Prednisolone Acetate   Tamoxifen   Thimerosal   Iodine-131   Latex           Current Medications (08/25/2021):  This is the current hospital active medication list Current Facility-Administered Medications  Medication Dose Route Frequency Provider Last Rate Last Admin   calcium-vitamin D (OSCAL WITH D) 500-5 MG-MCG per tablet 1 tablet  1 tablet Oral Q breakfast Armond Hang, MD   1 tablet at 08/25/21 9758   Chlorhexidine Gluconate Cloth 2 % PADS 6 each  6 each Topical Q0600 Armond Hang, MD   6 each at 08/25/21 8325   cholecalciferol (VITAMIN D3) tablet 5,000 Units  5,000 Units Oral Daily Armond Hang, MD   5,000 Units at 08/25/21 0818   diclofenac (VOLTAREN) EC tablet 75 mg  75 mg Oral BID PRN Armond Hang, MD       diclofenac Sodium (VOLTAREN) 1 % topical gel 2-4 g  2-4 g Topical QID PRN Tamala Julian, Rondell A, MD       enoxaparin (LOVENOX) injection 40 mg  40 mg Subcutaneous Q24H Armond Hang, MD   40 mg at 08/25/21 0817   fluorometholone (FML) 0.1 % ophthalmic suspension 1 drop  1 drop Right Eye q AM Fuller Plan A, MD   1 drop at 08/25/21 0819   HYDROmorphone (DILAUDID) injection 0.5-1 mg  0.5-1 mg Intravenous Q3H PRN Little Ishikawa, MD   1 mg at 08/25/21 0545   mupirocin ointment (BACTROBAN) 2 % 1 application  1 application Nasal BID Armond Hang, MD   1 application at 49/82/64 0820   oxyCODONE (Oxy IR/ROXICODONE) immediate release tablet 5-10 mg  5-10 mg Oral Q4H PRN Fuller Plan A, MD   10 mg at 08/25/21 0830   pantoprazole (PROTONIX) EC tablet 40 mg  40 mg Oral Daily Emmons, Rondell A, MD   40 mg at 08/25/21 0818   polyethylene glycol  (MIRALAX / GLYCOLAX) packet 17 g  17 g Oral BID Little Ishikawa, MD   17 g at 08/25/21 0820   senna-docusate (Senokot-S) tablet 1 tablet  1 tablet Oral BID Armond Hang, MD   1 tablet at 08/25/21 0818   timolol (TIMOPTIC) 0.5 % ophthalmic solution 1 drop  1 drop Right Eye Daily Fuller Plan A, MD   1 drop at 08/25/21 0819   valACYclovir (VALTREX) tablet 1,000 mg  1,000 mg Oral QODAY Bearman, Rondell A, MD       vitamin B-12 (CYANOCOBALAMIN) tablet 5,000 mcg  5,000 mcg Oral Daily Armond Hang, MD   5,000 mcg at 08/25/21 1583     Discharge Medications: Please see discharge summary for a list of discharge medications.  Relevant Imaging Results:  Relevant Lab Results:   Additional Information SSN [id number].  Pt is vaccinated for covid but not boosted.  Joanne Chars, LCSW

## 2021-08-25 NOTE — TOC Initial Note (Addendum)
Transition of Care [facility]) - Initial/Assessment Note    Patient Details  Name: [name] MRN: [id number] Date of Birth: [birth year]  [facility] of [facility]) CM/SW Contact:    Joanne Chars, LCSW Phone Number: 08/25/2021, 11:46 AM  Clinical Narrative:   CSW met with pt regarding recommendation for SNF.  Pt is agreeable, choice document given, pt asking for [facility].  Permission given to send out referral in hub and to speak with son Coralyn Mark.  Pt is vaccinated for covid but not boosted.  Referral sent out in hub for SNF, request made that [facility] review this referral.         1500: bed offer made by [facility].  They can accept pt tomorrow.  MD notified.           Expected Discharge Plan: Skilled Nursing Facility Barriers to Discharge: Continued Medical Work up, SNF Pending bed offer   Patient Goals and CMS Choice Patient states their goals for this hospitalization and ongoing recovery are:: "walking again" CMS Medicare.gov Compare Post Acute Care list provided to:: Patient Choice offered to / list presented to : Patient  Expected Discharge Plan and Services Expected Discharge Plan: [place] In-house Referral: Clinical Social Work   Post Acute Care Choice: [place] Living arrangements for the past 2 months: [place]                                      Prior Living Arrangements/Services Living arrangements for the past 2 months: Single Family Home Lives with:: Self Patient language and need for interpreter reviewed:: Yes Do you feel safe going back to the place where you live?: Yes      Need for Family Participation in Patient Care: Yes (Comment) Care giver support system in place?: Yes (comment) Current home services: Other (comment) (none) Criminal Activity/Legal Involvement Pertinent to Current Situation/Hospitalization: No - Comment as needed  Activities of Daily Living Home Assistive Devices/Equipment: Cane  (specify quad or straight), Bedside commode/3-in-1, Dentures (specify type) ADL Screening (condition at time of admission) Patient's cognitive ability adequate to safely complete daily activities?: Yes Is the patient deaf or have difficulty hearing?: No Does the patient have difficulty seeing, even when wearing glasses/contacts?: No Does the patient have difficulty concentrating, remembering, or making decisions?: No Patient able to express need for assistance with ADLs?: Yes Does the patient have difficulty dressing or bathing?: No Independently performs ADLs?: Yes (appropriate for developmental age) Does the patient have difficulty walking or climbing stairs?: Yes (walked with a cane prior to this fall) Weakness of Legs: None Weakness of Arms/Hands: None  Permission Sought/Granted Permission sought to share information with : Family Supports Permission granted to share information with : Yes, Verbal Permission Granted  Share Information with NAME: son Coralyn Mark  Permission granted to share info w AGENCY: SNF        Emotional Assessment Appearance:: Appears stated age Attitude/Demeanor/Rapport: Engaged Affect (typically observed): Appropriate, Pleasant Orientation: : Oriented to Self, Oriented to Place, Oriented to  Time, Oriented to Situation Alcohol / Substance Use: Not Applicable Psych Involvement: No (comment)  Admission diagnosis:  Closed left ankle fracture [S82.892A] Closed fracture of left ankle, initial encounter [E02.233K] Patient Active Problem List   Diagnosis Date Noted   Fall at home, initial encounter 08/20/2021   Closed left ankle fracture 08/20/2021   Normocytic anemia 08/20/2021   History of  breast cancer 08/20/2021   Uveitic glaucoma of right eye, moderate stage 08/08/2020   Pain and swelling of right lower leg 07/21/2020   Aftercare following joint replacement surgery 01/23/2020   Neurogenic claudication due to lumbar spinal stenosis 05/28/2019   Right groin  pain 05/28/2019   Polymyalgia rheumatica ([place]) 10/09/2018   BMI 40.0-44.9, adult ([place]) 10/09/2018   Foraminal stenosis of lumbar region 05/11/2018   Spinal stenosis of lumbar region at multiple levels 05/11/2018   On prednisone therapy 02/14/2018   Herpes zoster 10/16/2017   Chronic pain of both shoulders 08/10/2017   Prediabetes 05/26/2017   Primary osteoarthritis of both hips 03/15/2017   Trochanteric bursitis of left hip 03/15/2017   Recurrent epistaxis 12/09/2016   Dysuria 11/03/2016   Insomnia 11/03/2016   Glaucoma 05/05/2016   LPRD (laryngopharyngeal reflux disease) 01/26/2016   Erosive esophagitis 11/27/2015   Schatzki's ring of distal esophagus 11/27/2015   Chronic sinusitis 10/16/2015   Deviated nasal septum 10/16/2015   Nasal polyp 10/16/2015   Osteoporosis 08/15/2015   Dysphagia, pharyngoesophageal phase 04/01/2015   Endometrial polyp 10/24/2014   Fatigue 07/09/2014   Hyperlipidemia    Breast cancer, right ([place]) 02/01/2014   Malignant melanoma of arm ([place]) 11/23/2013   Nasal septum ulceration 10/16/2013   Seborrheic keratosis 10/16/2013   Vitamin D deficiency 02/08/2013   Sciatica of right side 01/24/2013   Esophageal ulcer 01/24/2013   Esophageal stenosis 01/24/2013   Choroidal nevus of right eye 10/27/2012   Impacted cerumen of right ear 10/11/2012   Frequency 01/19/2012   Gastroesophageal reflux disease 09/01/2011   Horseshoe tear of retina without detachment 08/03/2011   Horseshoe retinal tear of right eye 08/03/2011   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

## 2021-08-25 NOTE — Plan of Care (Signed)
°  Problem: Education: Goal: Knowledge of the prescribed therapeutic regimen will improve Outcome: Progressing   Problem: Physical Regulation: Goal: Postoperative complications will be avoided or minimized Outcome: Progressing   Problem: Pain Management: Goal: Pain level will decrease with appropriate interventions Outcome: Progressing

## 2021-08-26 DIAGNOSIS — S82892A Other fracture of left lower leg, initial encounter for closed fracture: Secondary | ICD-10-CM | POA: Diagnosis not present

## 2021-08-26 LAB — BASIC METABOLIC PANEL
Anion gap: 7 (ref 5–15)
BUN: 11 mg/dL (ref 8–23)
CO2: 29 mmol/L (ref 22–32)
Calcium: 9.7 mg/dL (ref 8.9–10.3)
Chloride: 99 mmol/L (ref 98–111)
Creatinine, Ser: 0.73 mg/dL (ref 0.44–1.00)
GFR, Estimated: >60 mL/min (ref >60)
Glucose, Bld: 137 mg/dL — ABNORMAL HIGH (ref 70–99)
Potassium: 4.4 mmol/L (ref 3.5–5.1)
Sodium: 135 mmol/L (ref 135–145)

## 2021-08-26 LAB — CBC
HCT: 29.3 % — ABNORMAL LOW (ref 36.0–46.0)
Hemoglobin: 9.5 g/dL — ABNORMAL LOW (ref 12.0–15.0)
MCH: 31 pg (ref 26.0–34.0)
MCHC: 32.4 g/dL (ref 30.0–36.0)
MCV: 95.8 fL (ref 80.0–100.0)
Platelets: 221 10*3/uL (ref 150–400)
RBC: 3.06 MIL/uL — ABNORMAL LOW (ref 3.87–5.11)
RDW: 13.4 % (ref 11.5–15.5)
WBC: 9.5 10*3/uL (ref 4.0–10.5)
nRBC: 0 % (ref 0.0–0.2)

## 2021-08-26 LAB — RESP PANEL BY RT-PCR (FLU A&B, COVID) ARPGX2
Influenza A by PCR: NEGATIVE
Influenza B by PCR: NEGATIVE
SARS Coronavirus 2 by RT PCR: NEGATIVE

## 2021-08-26 MED ORDER — ACETAMINOPHEN 325 MG PO TABS
650.0000 mg | ORAL_TABLET | Freq: Three times a day (TID) | ORAL | Status: DC
  Administered 2021-08-26: 650 mg via ORAL
  Filled 2021-08-26: qty 2

## 2021-08-26 MED ORDER — ACETAMINOPHEN 325 MG PO TABS: 650.0000 mg | ORAL_TABLET | Freq: Three times a day (TID) | ORAL | Status: DC

## 2021-08-26 MED ORDER — POLYETHYLENE GLYCOL 3350 17 G PO PACK: 17.0000 g | PACK | Freq: Two times a day (BID) | ORAL | 0 refills | Status: DC

## 2021-08-26 MED ORDER — OXYCODONE HCL 5 MG PO TABS: 5.0000 mg | ORAL_TABLET | ORAL | 0 refills | Status: DC | PRN

## 2021-08-26 MED ORDER — SENNOSIDES-DOCUSATE SODIUM 8.6-50 MG PO TABS: 1.0000 | ORAL_TABLET | Freq: Two times a day (BID) | ORAL | Status: DC

## 2021-08-26 MED ORDER — ENOXAPARIN SODIUM 40 MG/0.4ML IJ SOSY: 40.0000 mg | PREFILLED_SYRINGE | INTRAMUSCULAR | Status: DC

## 2021-08-26 NOTE — TOC Transition Note (Signed)
Transition of Care [facility]) - CM/SW Discharge Note   Patient Details  Name: [name] MRN: [id number] Date of Birth: 04-14-1946  [facility] of Care [facility]) CM/SW Contact:  Joanne Chars, LCSW Phone Number: 08/26/2021, 11:57 AM   Clinical Narrative:  Pt discharging to [facility].  RN call report to [phone]. PTAR called [phone].  No spots available with [facility].      Final next level of care: Skilled Nursing Facility Barriers to Discharge: Barriers Resolved   Patient Goals and CMS Choice Patient states their goals for this hospitalization and ongoing recovery are:: "walking again" CMS Medicare.gov Compare Post Acute Care list provided to:: Patient Choice offered to / list presented to : Patient  Discharge Placement              Patient chooses bed at: [facility] [place] Patient to be transferred to facility by: [place] Name of family member notified: son Coralyn Mark Patient and family notified of of transfer: 08/26/21  Discharge Plan and Services In-house Referral: Clinical Social Work   Post Acute Care Choice: [place]                               Social Determinants of Health (SDOH) Interventions     Readmission Risk Interventions No flowsheet data found.

## 2021-08-26 NOTE — Plan of Care (Signed)
°  Problem: Education: Goal: Knowledge of the prescribed therapeutic regimen will improve Outcome: Progressing   Problem: Activity: Goal: Ability to increase mobility will improve Outcome: Progressing   Problem: Physical Regulation: Goal: Postoperative complications will be avoided or minimized Outcome: Progressing   Problem: Pain Management: Goal: Pain level will decrease with appropriate interventions Outcome: Progressing   Problem: Skin Integrity: Goal: Will show signs of wound healing Outcome: Progressing   Problem: Education: Goal: Knowledge of General Education information will improve Description: Including pain rating scale, medication(s)/side effects and non-pharmacologic comfort measures Outcome: Progressing   Problem: Health Behavior/Discharge Planning: Goal: Ability to manage health-related needs will improve Outcome: Progressing   Problem: Clinical Measurements: Goal: Ability to maintain clinical measurements within normal limits will improve Outcome: Progressing Goal: Will remain free from infection Outcome: Progressing Goal: Diagnostic test results will improve Outcome: Progressing Goal: Respiratory complications will improve Outcome: Progressing Goal: Cardiovascular complication will be avoided Outcome: Progressing   Problem: Activity: Goal: Risk for activity intolerance will decrease Outcome: Progressing   Problem: Nutrition: Goal: Adequate nutrition will be maintained Outcome: Progressing   Problem: Coping: Goal: Level of anxiety will decrease Outcome: Progressing   Problem: Elimination: Goal: Will not experience complications related to bowel motility Outcome: Progressing Goal: Will not experience complications related to urinary retention Outcome: Progressing   Problem: Pain Managment: Goal: General experience of comfort will improve Outcome: Progressing   Problem: Safety: Goal: Ability to remain free from injury will  improve Outcome: Progressing   Problem: Skin Integrity: Goal: Risk for impaired skin integrity will decrease Outcome: Progressing

## 2021-08-26 NOTE — Assessment & Plan Note (Signed)
Patient alternates between 20 and 40 mg of omeprazole every other day.

## 2021-08-26 NOTE — Discharge Summary (Addendum)
[facility]  Physician Discharge Summary   Patient ID: [name] MRN: [id number] DOB/AGE: 1945-08-01 76 y.o.  Admit date: 08/20/2021 Discharge date:   08/26/2021   PCP: Wardell Honour, MD  DISCHARGE DIAGNOSES:  Principal Problem:   Closed left ankle fracture Active Problems:   Malignant melanoma of arm (HCC)   Polymyalgia rheumatica (HCC)   History of breast cancer   Gastroesophageal reflux disease   Normocytic anemia   Fall at home, initial encounter   RECOMMENDATIONS FOR OUTPATIENT FOLLOW UP: Please check CBC and basic metabolic panel on 08/31/03 Follow-up with orthopedics in 1 week.   Home Health: Going to SNF Equipment/Devices: None  CODE STATUS: Full code  DISCHARGE CONDITION: fair  Diet recommendation: Regular  INITIAL HISTORY: [name] is a 76 y.o. 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 Orthopedics consulted.  Patient underwent ORIF on 1/28.  Plan is for her to go to skilled nursing facility for short-term rehab.  Consultations: Orthopedics  Procedures: ORIF left ankle  HOSPITAL COURSE:    Assessment and Plan: * Closed left ankle fracture- (present on admission) -Orthopedics was consulted.  She is s/p ORIF 08/22/21 - tolerated well -Pain has been poorly controlled.  Seems to be better today compared to yesterday.  She is looking forward to go to rehab.  We will add scheduled Tylenol.  Can also use NSAIDs since her renal function is normal. -Increased risk for ongoing fracture given chronic steroid use and possible underlying  secondary osteoporosis - may benefit from bisphosphonate therapy - has been on previously but discontinued last year.  Will defer this to outpatient providers. -Plan is for short-term rehab at skilled nursing facility.  Malignant melanoma of arm ([place])- (present on admission) - Follows at [facility] for surveillance; repeat imaging per their office  Polymyalgia rheumatica [facility])- (present on admission) - Slowly weaning off steroids - was on 0.5mg  at intake - have decided to stop steroids while here - Likely the etiology of her ankle fracture and bilateral hip replacements.  History of breast cancer Diagnosed back in 2010 status post right lumpectomy and subsequent radiation.  Gastroesophageal reflux disease- (present on admission) Patient alternates between 20 and 40 mg of omeprazole every other day.  Normocytic anemia- (present on admission) Drop in hemoglobin is likely from operative loss as well as dilutional effect.  No other overt loss noted.  Check in a few days.         Obesity Estimated body mass index is 44.29 kg/m as calculated from the following:   Height as of this encounter: 5\' 4"  (1.626 m).   Weight as of this encounter: 117 kg.  Patient is stable.  Okay for discharge to SNF today.   PERTINENT LABS:  The results of significant diagnostics from this hospitalization (including imaging, microbiology, ancillary and laboratory) are listed below for reference.    Microbiology: Recent Results (from the past 240 hour(s))  Resp Panel by RT-PCR (Flu A&B, Covid) Nasopharyngeal Swab     Status: None   Collection Time: 08/20/21  2:02 PM   Specimen: Nasopharyngeal Swab; Nasopharyngeal(NP) swabs  in vial transport medium  Result Value Ref Range Status   SARS Coronavirus 2 by RT PCR NEGATIVE NEGATIVE Final    Comment: (NOTE) SARS-CoV-2 target nucleic acids are NOT DETECTED.  The SARS-CoV-2 RNA is generally detectable in upper respiratory specimens during  the acute phase of infection. The lowest concentration of SARS-CoV-2 viral copies this assay can detect is 138 copies/mL. A negative result does not preclude SARS-Cov-2 infection and should not be used as the sole basis for treatment or other patient management decisions. A negative result may occur with  improper specimen collection/handling, submission of specimen other than nasopharyngeal swab, presence of viral mutation(s) within the areas targeted by this assay, and inadequate number of viral copies(<138 copies/mL). A negative result must be combined with clinical observations, patient history, and epidemiological information. The expected result is Negative.  Fact Sheet for Patients:  EntrepreneurPulse.com.au  Fact Sheet for Healthcare Providers:  IncredibleEmployment.be  This test is no t yet approved or cleared by the [place] FDA and  has been authorized for detection and/or diagnosis of SARS-CoV-2 by FDA under an Emergency Use Authorization (EUA). This EUA will remain  in effect (meaning this test can be used) for the duration of the COVID-19 declaration under Section 564(b)(1) of the Act, 21 U.S.C.section 360bbb-3(b)(1), unless the authorization is terminated  or revoked sooner.       Influenza A by PCR NEGATIVE NEGATIVE Final   Influenza B by PCR NEGATIVE NEGATIVE Final    Comment: (NOTE) The Xpert Xpress SARS-CoV-2/FLU/RSV plus assay is intended as an aid in the diagnosis of influenza from Nasopharyngeal swab specimens and should not be used as a sole basis for treatment. Nasal washings and aspirates are unacceptable for Xpert Xpress SARS-CoV-2/FLU/RSV testing.  Fact Sheet for Patients: EntrepreneurPulse.com.au  Fact Sheet for Healthcare Providers: IncredibleEmployment.be  This test is not yet approved or cleared by the [place] FDA and has been authorized for detection and/or  diagnosis of SARS-CoV-2 by FDA under an Emergency Use Authorization (EUA). This EUA will remain in effect (meaning this test can be used) for the duration of the COVID-19 declaration under Section 564(b)(1) of the Act, 21 U.S.C. section 360bbb-3(b)(1), unless the authorization is terminated or revoked.  Performed at [facility], [place] [address], [place], [place] [address]   Surgical pcr screen     Status: Abnormal   Collection Time: 08/21/21 11:10 AM   Specimen: Nasal Mucosa; Nasal Swab  Result Value Ref Range Status   MRSA, PCR NEGATIVE NEGATIVE Final   Staphylococcus aureus POSITIVE (A) NEGATIVE Final    Comment: (NOTE) The Xpert SA Assay (FDA approved for NASAL specimens in patients 33 years of age and older), is one component of a comprehensive surveillance program. It is not intended to diagnose infection nor to guide or monitor treatment. Performed at [facility], [place] [address], [place], [place] [address]      Labs:  COVID-19 Labs   Lab Results  Component Value Date   [place] 08/20/2021      Basic Metabolic Panel: Recent Labs  Lab 08/20/21 1351 08/21/21 0143 08/24/21 0629 08/26/21 0533  NA 137 138 136 135  K 5.0 4.1 4.0 4.4  CL 103 101 99 99  CO2 25 26 30 29   GLUCOSE 114* 119* 128* 137*  BUN 17 12 20 11   CREATININE 0.54 0.59 0.89 0.73  CALCIUM 8.9 9.7 9.0 9.7    CBC: Recent Labs  Lab 08/20/21 1351 08/21/21 0143 08/24/21  6761 08/26/21 0533  WBC 9.2 11.0* 11.6* 9.5  NEUTROABS 7.0  --   --   --   HGB 11.8* 12.9 10.4* 9.5*  HCT 37.8 39.5 33.0* 29.3*  MCV 96.9 94.7 96.2 95.8  PLT 200 226 232 221    IMAGING STUDIES DG Tibia/Fibula Left  Result Date: 08/20/2021 CLINICAL DATA:  Fall, left leg and ankle pain EXAM: LEFT TIBIA AND FIBULA - 2 VIEW COMPARISON:  None. FINDINGS: Tricompartmental marginal spurring in the knee without knee effusion. Fractures of the lateral and medial malleoli as discussed under the dedicated  ankle imaging. Plantar calcaneal spur. IMPRESSION: 1. Malleolar fractures better shown on dedicated ankle imaging. 2. 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 Electronically Signed   By: Van Clines M.D.   On: 08/20/2021 11:25   DG Ankle 2 Views Left  Result Date: 08/22/2021 CLINICAL DATA:  Fracture distal left tibia and fibula EXAM: LEFT ANKLE - 2 VIEW COMPARISON:  08/20/2021 FINDINGS: Fluoroscopic images show internal fixation of fractures of distal left tibia and fibula with metallic side plates and surgical screws. Fluoroscopic time was 103 seconds. Radiation dose is 4.49 mGy. IMPRESSION: Fluoroscopic assistance was provided for internal fixation of fractures of distal left tibia and fibula. Electronically Signed   By: Elmer Picker M.D.   On: 08/22/2021 13:04   DG Ankle Complete Left  Result Date: 08/20/2021 CLINICAL DATA:  Left lower leg pain.  Left ankle injury after fall. EXAM: LEFT ANKLE COMPLETE - 3+ VIEW COMPARISON:  None. FINDINGS: Relatively nondisplaced and non dislocated stage IV Weber B fracture of the left ankle with oblique lateral malleolar component, transverse medial malleolar component (with mild comminution but also includes an oblique component), and oblique posterior malleolar component. Surrounding soft tissue swelling noted. Plantar calcaneal spur. IMPRESSION: 1. Stage IV Weber B fracture of the left ankle. Electronically Signed   By: Van Clines M.D.   On: 08/20/2021 11:24   CT ANKLE LEFT WO CONTRAST  Result Date: 08/20/2021 CLINICAL DATA:  Ankle fracture, preoperative planning. EXAM: CT OF THE LEFT ANKLE WITHOUT CONTRAST TECHNIQUE: Multidetector CT imaging of the left ankle was performed according to the standard protocol. Multiplanar CT image reconstructions were also generated. RADIATION DOSE REDUCTION: This exam was performed according to the departmental dose-optimization program which includes automated exposure control, adjustment  of the mA and/or kV according to patient size and/or use of iterative reconstruction technique. COMPARISON:  Radiograph earlier today. FINDINGS: Bones/Joint/Cartilage Comminuted and mildly displaced medial malleolar fracture with dominant fracture plane transverse at the level of the ankle mortise. There is also involvement of the anteromedial aspect of the tibial metaphysis. Mildly displaced and comminuted posterior malleolar fracture. Articular surface displacement is 2-3 mm. Oblique displaced distal fibular fracture at and just above the level of the ankle mortise. There are multiple small fracture fragments in the lateral clear space and in the lateral ankle joint. Slight lateral displacement of the talus. Intact talar dome. Plantar calcaneal spur. Ligaments Suboptimally assessed by CT. Muscles and Tendons No evidence of tendon entrapment.  Intact Achilles tendon. Soft tissues Diffuse soft tissue edema. IMPRESSION: 1. Mildly displaced trimalleolar fracture. 2. Slight lateral displacement of the talus. Multiple small fracture fragments in the lateral ankle joint. Electronically Signed   By: Keith Rake M.D.   On: 08/20/2021 23:10   DG C-Arm 1-60 Min-No Report  Result Date: 08/22/2021 Fluoroscopy was utilized by the requesting physician.  No radiographic interpretation.   DG C-Arm 1-60 Min-No Report  Result Date:  08/22/2021 Fluoroscopy was utilized by the requesting physician.  No radiographic interpretation.    DISCHARGE EXAMINATION: Vitals:   08/25/21 1147 08/25/21 1956 08/26/21 0503 08/26/21 0800  BP: (!) 147/81 137/74 135/72 132/81  Pulse: 97 95 90 97  Resp: 17 18 19 17   Temp: 99.1 F (37.3 C) 98.6 F (37 C)  98.6 F (37 C)  TempSrc: Axillary Oral  Oral  SpO2: 94% 92% 93% 95%  Weight:      Height:       General appearance: Awake alert.  In no distress Resp: Clear to auscultation bilaterally.  Normal effort Cardio: S1-S2 is normal regular.  No S3-S4.  No rubs murmurs or  bruit GI: Abdomen is soft.  Nontender nondistended.  Bowel sounds are present normal.  No masses organomegaly    DISPOSITION: SNF  Discharge Instructions     Call MD for:  difficulty breathing, headache or visual disturbances   Complete by: As directed    Call MD for:  extreme fatigue   Complete by: As directed    Call MD for:  persistant dizziness or light-headedness   Complete by: As directed    Call MD for:  persistant nausea and vomiting   Complete by: As directed    Call MD for:  severe uncontrolled pain   Complete by: As directed    Call MD for:  temperature >100.4   Complete by: As directed    Diet - low sodium heart healthy   Complete by: As directed    Discharge instructions   Complete by: As directed    Please review instructions on the discharge summary  You were cared for by a hospitalist during your hospital stay. If you have any questions about your discharge medications or the care you received while you were in the hospital after you are discharged, you can call the unit and asked to speak with the hospitalist on call if the hospitalist that took care of you is not available. Once you are discharged, your primary care physician will handle any further medical issues. Please note that NO REFILLS for any discharge medications will be authorized once you are discharged, as it is imperative that you return to your primary care physician (or establish a relationship with a primary care physician if you do not have one) for your aftercare needs so that they can reassess your need for medications and monitor your lab values. If you do not have a primary care physician, you can call [phone] for a physician referral.   Discharge wound care:   Complete by: As directed    Wound care as per orthopedic surgeon Dr. Kathaleen Bury.   Increase activity slowly   Complete by: As directed           Allergies as of 08/26/2021       Reactions   Adhesive [tape] Dermatitis, Other (See  Comments)   Makes the skin "blood red"   Cymbalta [duloxetine Hcl] Other (See Comments)   sleepiness   Gabapentin Other (See Comments)   Sleepiness   Betimol [timolol Maleate] Other (See Comments)   Allergic to preservatives - redness and affects eye pressure   Cosopt [dorzolamide Hcl-timolol Mal] Other (See Comments)   Turns eyes red, increases eye pressure   Penicillin G Other (See Comments)   Reaction not recalled   Penicillins Other (See Comments)   Reaction not recalled   Prednisolone Acetate Other (See Comments)   Caused increased eye pressure   Tamoxifen Other (See  Comments)   Vaginal bleeding   Thimerosal Other (See Comments)   Makes pt eyes red   Iodine-131 Rash, Other (See Comments)   Dye from PET scan only, states has had since and did ok   Latex Rash        Medication List     STOP taking these medications    HYDROcodone-acetaminophen 10-325 MG tablet Commonly known as: NORCO   NON FORMULARY   predniSONE 1 MG tablet Commonly known as: DELTASONE       TAKE these medications    acetaminophen 325 MG tablet Commonly known as: TYLENOL Take 2 tablets (650 mg total) by mouth 3 (three) times daily.   CITRACAL +D3 PO Take 2 tablets by mouth daily with breakfast.   Cyanocobalamin 5000 MCG Caps Take 5,000 mcg by mouth in the morning.   diclofenac 75 MG EC tablet Commonly known as: VOLTAREN Take 1 tablet by mouth 2 (two) times daily as needed (for pain).   enoxaparin 40 MG/0.4ML injection Commonly known as: LOVENOX Inject 0.4 mLs (40 mg total) into the skin daily for 24 days. Start taking on: August 27, 2021   fluorometholone 0.1 % ophthalmic suspension Commonly known as: FML Place 1 drop into the right eye in the morning.   ibuprofen 200 MG tablet Commonly known as: ADVIL Take 200 mg by mouth every 6 (six) hours as needed for mild pain or headache.   omeprazole 20 MG capsule Commonly known as: PRILOSEC Take 20-40 mg by mouth See admin  instructions. Take 20 mg alternating with 40 mg every other day What changed: Another medication with the same name was removed. Continue taking this medication, and follow the directions you see here.   oxyCODONE 5 MG immediate release tablet Commonly known as: Oxy IR/ROXICODONE Take 1-2 tablets (5-10 mg total) by mouth every 4 (four) hours as needed for severe pain.   polyethylene glycol 17 g packet Commonly known as: MIRALAX / GLYCOLAX Take 17 g by mouth 2 (two) times daily.   senna-docusate 8.6-50 MG tablet Commonly known as: Senokot-S Take 1 tablet by mouth 2 (two) times daily.   timolol 0.5 % ophthalmic solution Commonly known as: TIMOPTIC Place 1 drop into the right eye in the morning and at bedtime.   valACYclovir 1000 MG tablet Commonly known as: VALTREX Take 1,000 mg by mouth every other day.   Vitamin D-3 125 MCG (5000 UT) Tabs Take 5,000 Units by mouth daily.   Voltaren 1 % Gel Generic drug: diclofenac Sodium Apply 2-4 g topically 4 (four) times daily as needed (for pain (hands, neck, shoulders, etc..)).               Discharge Care Instructions  (From admission, onward)           Start     Ordered   08/26/21 0000  Discharge wound care:       Comments: Wound care as per orthopedic surgeon Dr. Kathaleen Bury.   08/26/21 0933              Follow-up Information     Wardell Honour, MD Follow up in 3 week(s).   Specialties: Family Medicine, Emergency Medicine Contact information: [place] [address] [phone]         Armond Hang, MD Follow up in 1 week(s).   Specialty: Orthopedic Surgery Contact information: [address] [address] [phone]  TOTAL DISCHARGE TIME: 35 minutes  [place] [employer] on [email or web address]  08/26/2021, 10:15 AM

## 2021-08-26 NOTE — Assessment & Plan Note (Signed)
Diagnosed back in 2010 status post right lumpectomy and subsequent radiation.

## 2021-08-26 NOTE — Progress Notes (Signed)
1217 - Report has been called to [facility] at [facility] [phone]). Pt's IV has been removed and pt is waiting for [facility] for transport to receiving facility.

## 2021-08-26 NOTE — Assessment & Plan Note (Signed)
-   Follows at [facility] for surveillance; repeat imaging per their office

## 2021-08-26 NOTE — Assessment & Plan Note (Signed)
-   Slowly weaning off steroids - was on 0.5mg  at intake - have decided to stop steroids while here - Likely the etiology of her ankle fracture and bilateral hip replacements.

## 2021-08-26 NOTE — Plan of Care (Signed)
Problem: Education: Goal: Knowledge of the prescribed therapeutic regimen will improve 08/26/2021 1223 by Damaris Schooner, RN Outcome: Adequate for Discharge 08/26/2021 1216 by Damaris Schooner, RN Outcome: Progressing   Problem: Activity: Goal: Ability to increase mobility will improve 08/26/2021 1223 by Damaris Schooner, RN Outcome: Adequate for Discharge 08/26/2021 1216 by Damaris Schooner, RN Outcome: Progressing   Problem: Physical Regulation: Goal: Postoperative complications will be avoided or minimized 08/26/2021 1223 by Damaris Schooner, RN Outcome: Adequate for Discharge 08/26/2021 1216 by Damaris Schooner, RN Outcome: Progressing   Problem: Pain Management: Goal: Pain level will decrease with appropriate interventions 08/26/2021 1223 by Damaris Schooner, RN Outcome: Adequate for Discharge 08/26/2021 1216 by Damaris Schooner, RN Outcome: Progressing   Problem: Skin Integrity: Goal: Will show signs of wound healing 08/26/2021 1223 by Damaris Schooner, RN Outcome: Adequate for Discharge 08/26/2021 1216 by Damaris Schooner, RN Outcome: Progressing   Problem: Education: Goal: Knowledge of General Education information will improve Description: Including pain rating scale, medication(s)/side effects and non-pharmacologic comfort measures 08/26/2021 1223 by Damaris Schooner, RN Outcome: Adequate for Discharge 08/26/2021 1216 by Damaris Schooner, RN Outcome: Progressing   Problem: Health Behavior/Discharge Planning: Goal: Ability to manage health-related needs will improve 08/26/2021 1223 by Damaris Schooner, RN Outcome: Adequate for Discharge 08/26/2021 1216 by Damaris Schooner, RN Outcome: Progressing   Problem: Clinical Measurements: Goal: Ability to maintain clinical measurements within normal limits will improve 08/26/2021 1223 by Damaris Schooner, RN Outcome: Adequate for Discharge 08/26/2021 1216 by Damaris Schooner, RN Outcome: Progressing Goal: Will remain free from infection 08/26/2021 1223 by  Damaris Schooner, RN Outcome: Adequate for Discharge 08/26/2021 1216 by Damaris Schooner, RN Outcome: Progressing Goal: Diagnostic test results will improve 08/26/2021 1223 by Damaris Schooner, RN Outcome: Adequate for Discharge 08/26/2021 1216 by Damaris Schooner, RN Outcome: Progressing Goal: Respiratory complications will improve 08/26/2021 1223 by Damaris Schooner, RN Outcome: Adequate for Discharge 08/26/2021 1216 by Damaris Schooner, RN Outcome: Progressing Goal: Cardiovascular complication will be avoided 08/26/2021 1223 by Damaris Schooner, RN Outcome: Adequate for Discharge 08/26/2021 1216 by Damaris Schooner, RN Outcome: Progressing   Problem: Activity: Goal: Risk for activity intolerance will decrease 08/26/2021 1223 by Damaris Schooner, RN Outcome: Adequate for Discharge 08/26/2021 1216 by Damaris Schooner, RN Outcome: Progressing   Problem: Nutrition: Goal: Adequate nutrition will be maintained 08/26/2021 1223 by Damaris Schooner, RN Outcome: Adequate for Discharge 08/26/2021 1216 by Damaris Schooner, RN Outcome: Progressing   Problem: Coping: Goal: Level of anxiety will decrease 08/26/2021 1223 by Damaris Schooner, RN Outcome: Adequate for Discharge 08/26/2021 1216 by Damaris Schooner, RN Outcome: Progressing   Problem: Elimination: Goal: Will not experience complications related to bowel motility 08/26/2021 1223 by Damaris Schooner, RN Outcome: Adequate for Discharge 08/26/2021 1216 by Damaris Schooner, RN Outcome: Progressing Goal: Will not experience complications related to urinary retention 08/26/2021 1223 by Damaris Schooner, RN Outcome: Adequate for Discharge 08/26/2021 1216 by Damaris Schooner, RN Outcome: Progressing   Problem: Pain Managment: Goal: General experience of comfort will improve 08/26/2021 1223 by Damaris Schooner, RN Outcome: Adequate for Discharge 08/26/2021 1216 by Damaris Schooner, RN Outcome: Progressing   Problem: Safety: Goal: Ability to remain free from injury will  improve 08/26/2021 1223 by Damaris Schooner, RN Outcome: Adequate for Discharge 08/26/2021 1216 by Damaris Schooner, RN Outcome: Progressing   Problem: Skin  Integrity: Goal: Risk for impaired skin integrity will decrease 08/26/2021 1223 by Damaris Schooner, RN Outcome: Adequate for Discharge 08/26/2021 1216 by Damaris Schooner, RN Outcome: Progressing

## 2021-08-26 NOTE — Hospital Course (Signed)
[name] is a 76 y.o. 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 Orthopedics consulted.  Patient underwent ORIF on 1/28.  Plan is for her to go to skilled nursing facility for short-term rehab.

## 2021-08-26 NOTE — Assessment & Plan Note (Signed)
Drop in hemoglobin is likely from operative loss as well as dilutional effect.  No other overt loss noted.  Check in a few days.

## 2021-08-26 NOTE — Assessment & Plan Note (Signed)
-  Orthopedics was consulted.  She is s/p ORIF 08/22/21 - tolerated well -Pain has been poorly controlled.  Seems to be better today compared to yesterday.  She is looking forward to go to rehab.  We will add scheduled Tylenol.  Can also use NSAIDs since her renal function is normal. -Increased risk for ongoing fracture given chronic steroid use and possible underlying secondary osteoporosis - may benefit from bisphosphonate therapy - has been on previously but discontinued last year.  Will defer this to outpatient providers. -Plan is for short-term rehab at skilled nursing facility.

## 2021-08-27 ENCOUNTER — Telehealth: Payer: Self-pay | Admitting: *Deleted

## 2021-08-27 NOTE — Telephone Encounter (Signed)
Transition Care Management Unsuccessful Follow-up Telephone Call  Date of discharge and from where:  08/26/2020 [place]  Attempts:  1st Attempt  Reason for unsuccessful TCM follow-up call:  Unable to reach patient Patient admitted to [facility] SNF

## 2021-08-31 ENCOUNTER — Other Ambulatory Visit: Payer: Self-pay | Admitting: *Deleted

## 2021-08-31 NOTE — Patient Outreach (Signed)
Per [place] eligible member resides in [facility].  Screened for potential [facility] Care Management services  as a benefit of member's insurance plan.  Ms. Clover admitted to SNF on 08/26/21 after hospitalization.  Communication sent to facility SW to make aware writer is following for transition plans and [facility] needs.  Will continue to collaborate with facility SW and follow up with resident as appropriate.  Will continue to follow for potential [facility] needs and transition plans while member resides in SNF.   Marthenia Rolling, MSN, RN,BSN [place] Acute Care Coordinator [phone] [facility]) [phone]  (Toll free office)

## 2021-09-10 ENCOUNTER — Other Ambulatory Visit: Payer: Self-pay | Admitting: *Deleted

## 2021-09-10 NOTE — Patient Outreach (Signed)
[facility] Post- Acute Care Coordinator follow up. Per [place] eligible member resides in  [facility].  Screened for potential [facility] Care Management services as a benefit of member's insurance plan.  Update received from [place], [place] indicating [name] is non weight bearing status. She will remain privately until weight bearing status changes. Will be in [facility] a while longer.  Will continue to follow for potential [facility] needs and transition plans while member resides in SNF.     Katherine Rolling, MSN, RN,BSN [place] Acute Care Coordinator [phone] [facility]) [phone]  (Toll free office)

## 2021-09-14 ENCOUNTER — Other Ambulatory Visit: Payer: Self-pay | Admitting: *Deleted

## 2021-09-14 NOTE — Patient Outreach (Signed)
[facility] Post- Acute Care Coordinator follow up. Per [facility] eligible member resides in [facility].  Screened for potential [facility] Care Management services/ [facility] care coordination services as a benefit of member's insurance plan. ° °Facility site visit to [facility] skilled nursing facility. Met with [name] concerning anticipated transition plans. [name] endorses that she is currently under private pay until her weight bearing status is changed. She is currently NWB. States she ultimately plans to return home upon SNF transition. States she lives alone but her son lives on the same property and assists her as needed.  ° °Discussed [facility] Care Management services. [name] is agreeable to [facility] follow up when she eventually returns home.  ° °Provided [facility] Care Management brochure, 24-hr nurse advice line magnet, and contact information. ° °Will continue to follow while member resides in SNF.  ° ° °Atika Hall, MSN, RN,BSN °[facility] Post Acute Care Coordinator °[phone] ( [employer]) °[phone]  (Toll free office)  ° ° ° ° ° ° °  °

## 2021-10-20 ENCOUNTER — Emergency Department (HOSPITAL_COMMUNITY): Payer: Medicare Other

## 2021-10-20 ENCOUNTER — Other Ambulatory Visit: Payer: Self-pay

## 2021-10-20 ENCOUNTER — Emergency Department (HOSPITAL_COMMUNITY)
Admission: EM | Admit: 2021-10-20 | Discharge: 2021-10-20 | Disposition: A | Discharge status: Skilled Nursing Facility | Payer: Medicare Other | Attending: Emergency Medicine | Admitting: Emergency Medicine

## 2021-10-20 ENCOUNTER — Encounter (HOSPITAL_COMMUNITY): Payer: Self-pay | Admitting: Emergency Medicine

## 2021-10-20 DIAGNOSIS — Z8616 Personal history of COVID-19: Secondary | ICD-10-CM | POA: Insufficient documentation

## 2021-10-20 DIAGNOSIS — R109 Unspecified abdominal pain: Secondary | ICD-10-CM | POA: Diagnosis present

## 2021-10-20 DIAGNOSIS — Z20822 Contact with and (suspected) exposure to covid-19: Secondary | ICD-10-CM | POA: Diagnosis not present

## 2021-10-20 DIAGNOSIS — R1084 Generalized abdominal pain: Secondary | ICD-10-CM

## 2021-10-20 DIAGNOSIS — K439 Ventral hernia without obstruction or gangrene: Secondary | ICD-10-CM | POA: Diagnosis not present

## 2021-10-20 DIAGNOSIS — Z28311 Partially vaccinated for covid-19: Secondary | ICD-10-CM | POA: Insufficient documentation

## 2021-10-20 DIAGNOSIS — R000 Tachycardia, unspecified: Secondary | ICD-10-CM | POA: Diagnosis not present

## 2021-10-20 DIAGNOSIS — Z91040 Latex allergy status: Secondary | ICD-10-CM | POA: Diagnosis not present

## 2021-10-20 LAB — CBC WITH DIFFERENTIAL/PLATELET
Abs Immature Granulocytes: 0.02 10*3/uL (ref 0.00–0.07)
Basophils Absolute: 0.1 10*3/uL (ref 0.0–0.1)
Basophils Relative: 1 %
Eosinophils Absolute: 0.3 10*3/uL (ref 0.0–0.5)
Eosinophils Relative: 4 %
HCT: 41.6 % (ref 36.0–46.0)
Hemoglobin: 13.6 g/dL (ref 12.0–15.0)
Immature Granulocytes: 0 %
Lymphocytes Relative: 19 %
Lymphs Abs: 1.5 10*3/uL (ref 0.7–4.0)
MCH: 31.1 pg (ref 26.0–34.0)
MCHC: 32.7 g/dL (ref 30.0–36.0)
MCV: 95.2 fL (ref 80.0–100.0)
Monocytes Absolute: 0.7 10*3/uL (ref 0.1–1.0)
Monocytes Relative: 8 %
Neutro Abs: 5.3 10*3/uL (ref 1.7–7.7)
Neutrophils Relative %: 68 %
Platelets: 286 10*3/uL (ref 150–400)
RBC: 4.37 MIL/uL (ref 3.87–5.11)
RDW: 13.7 % (ref 11.5–15.5)
WBC: 7.9 10*3/uL (ref 4.0–10.5)
nRBC: 0 % (ref 0.0–0.2)

## 2021-10-20 LAB — RESP PANEL BY RT-PCR (FLU A&B, COVID) ARPGX2
Influenza A by PCR: NEGATIVE
Influenza B by PCR: NEGATIVE
SARS Coronavirus 2 by RT PCR: NEGATIVE

## 2021-10-20 LAB — COMPREHENSIVE METABOLIC PANEL
ALT: 17 U/L (ref 0–44)
AST: 17 U/L (ref 15–41)
Albumin: 3.8 g/dL (ref 3.5–5.0)
Alkaline Phosphatase: 81 U/L (ref 38–126)
Anion gap: 9 (ref 5–15)
BUN: 12 mg/dL (ref 8–23)
CO2: 28 mmol/L (ref 22–32)
Calcium: 10.2 mg/dL (ref 8.9–10.3)
Chloride: 99 mmol/L (ref 98–111)
Creatinine, Ser: 0.54 mg/dL (ref 0.44–1.00)
GFR, Estimated: >60 mL/min (ref >60)
Glucose, Bld: 136 mg/dL — ABNORMAL HIGH (ref 70–99)
Potassium: 3.9 mmol/L (ref 3.5–5.1)
Sodium: 136 mmol/L (ref 135–145)
Total Bilirubin: 0.5 mg/dL (ref 0.3–1.2)
Total Protein: 8 g/dL (ref 6.5–8.1)

## 2021-10-20 LAB — LIPASE, BLOOD: Lipase: 27 U/L (ref 11–51)

## 2021-10-20 MED ORDER — IOHEXOL 300 MG/ML  SOLN: 100.0000 mL | Freq: Once | INTRAMUSCULAR | Status: DC | PRN

## 2021-10-20 MED ORDER — OXYCODONE HCL 5 MG PO TABS
5.0000 mg | ORAL_TABLET | Freq: Once | ORAL | Status: AC
  Administered 2021-10-20: 5 mg via ORAL
  Filled 2021-10-20: qty 1

## 2021-10-20 NOTE — Discharge Instructions (Signed)
Please avoid putting increased pressure on your abdomen. ?Lay flat with cold therapy in place as much as possible ?Please return as you discussed with general surgery if symptoms are worsening, you are vomiting, you have fever, or increased pain ?Please call general surgery for outpatient follow-up and discussion regarding repair when you are ambulatory ?

## 2021-10-20 NOTE — ED Triage Notes (Signed)
Pt BIB EMS from [place], c/o confirmed hernia lower abdominal since this AM. Nausea/vomit x1 ? ?BP 210/160 ?P 116 ?T 98.3 ?spO2 98% RA ?CBG 109 ?

## 2021-10-20 NOTE — ED Notes (Signed)
Patient transported to CT 

## 2021-10-20 NOTE — ED Notes (Signed)
General surgery spoke to pt as [facility] was transporting her out of the facility on their stretcher.  ?

## 2021-10-20 NOTE — ED Notes (Signed)
[facility] called for transport.  

## 2021-10-20 NOTE — ED Provider Notes (Signed)
?[place] DEPT ?Provider Note ? ? ?CSN: [id number] ?Arrival date & time: 10/20/21  2725 ? ?  ? ?History ? ?Chief Complaint  ?Patient presents with  ? Abdominal Pain  ? ? ?[name] is a 76 y.o. female. ? ?HPI ?76 year old female history of left ankle fracture, hypercholesterolemia, melanoma, obesity, and presents today complaining of abdominal pain.  She states that this morning she had onset of abdominal swelling, distention, and pain.  She is nauseated and vomited 1 time arrival.  She did not eat anything this morning due to the pain and nausea.  She states that she was in her normal state of health last night which was normal eating and no abdominal pain.  She is currently at [facility] for rehab since her ankle fracture.  Denies any headache, head injury, chest pain, dyspnea, she has some cough and nasal congestion which she thinks is secondary to environmental allergies.  She reports that she had her initial COVID and COVID-vaccine but has not had any boosters.  She does not normally have ?  ? ?Home Medications ?Prior to Admission medications   ?Medication Sig Start Date End Date Taking? Authorizing Provider  ?acetaminophen (TYLENOL) 325 MG tablet Take 2 tablets (650 mg total) by mouth 3 (three) times daily. 08/26/21   Bonnielee Haff, MD  ?Calcium-Phosphorus-Vitamin D (CITRACAL +D3 PO) Take 2 tablets by mouth daily with breakfast.    [provider]  ?Cholecalciferol (VITAMIN D-3) 5000 units TABS Take 5,000 Units by mouth daily.    [provider]  ?Cyanocobalamin 5000 MCG CAPS Take 5,000 mcg by mouth in the morning.    [provider]  ?diclofenac (VOLTAREN) 75 MG EC tablet Take 1 tablet by mouth 2 (two) times daily as needed (for pain). 11/24/20   [provider]  ?enoxaparin (LOVENOX) 40 MG/0.4ML injection Inject 0.4 mLs (40 mg total) into the skin daily for 24 days. 08/27/21 09/20/21  Bonnielee Haff, MD  ?fluorometholone (FML) 0.1 % ophthalmic  suspension Place 1 drop into the right eye in the morning. 10/08/19   [provider]  ?ibuprofen (ADVIL) 200 MG tablet Take 200 mg by mouth every 6 (six) hours as needed for mild pain or headache.    [provider]  ?omeprazole (PRILOSEC) 20 MG capsule Take 20-40 mg by mouth See admin instructions. Take 20 mg alternating with 40 mg every other day    [provider]  ?oxyCODONE (OXY IR/ROXICODONE) 5 MG immediate release tablet Take 1-2 tablets (5-10 mg total) by mouth every 4 (four) hours as needed for severe pain. 08/26/21   Bonnielee Haff, MD  ?polyethylene glycol (MIRALAX / GLYCOLAX) 17 g packet Take 17 g by mouth 2 (two) times daily. 08/26/21   Bonnielee Haff, MD  ?senna-docusate (SENOKOT-S) 8.6-50 MG tablet Take 1 tablet by mouth 2 (two) times daily. 08/26/21   Bonnielee Haff, MD  ?timolol (TIMOPTIC) 0.5 % ophthalmic solution Place 1 drop into the right eye in the morning and at bedtime. 10/05/19   [provider]  ?valACYclovir (VALTREX) 1000 MG tablet Take 1,000 mg by mouth every other day.    [provider]  ?VOLTAREN 1 % GEL Apply 2-4 g topically 4 (four) times daily as needed (for pain (hands, neck, shoulders, etc..)).    [provider]  ?   ? ?Allergies    ?Adhesive [tape], Cymbalta [duloxetine hcl], Gabapentin, Betimol [timolol maleate], Cosopt [dorzolamide hcl-timolol mal], Penicillin g, Penicillins, Prednisolone acetate, Tamoxifen, Thimerosal, Iodine-131, and Latex   ? ?  Review of Systems   ?Review of Systems  ?All other systems reviewed and are negative. ? ?Physical Exam ?Updated Vital Signs ?BP (!) 162/97   Pulse 92   Temp 98.9 ?F (37.2 ?C) (Oral)   Resp 19   SpO2 95%  ?Physical Exam ?Vitals and nursing note reviewed.  ?Constitutional:   ?   Appearance: She is well-developed. She is ill-appearing.  ?HENT:  ?   Head: Normocephalic.  ?   Mouth/Throat:  ?   Pharynx: Oropharynx is clear.  ?Eyes:  ?   Extraocular Movements: Extraocular movements  intact.  ?Cardiovascular:  ?   Rate and Rhythm: Regular rhythm. Tachycardia present.  ?Pulmonary:  ?   Effort: Pulmonary effort is normal.  ?   Breath sounds: Normal breath sounds.  ?Abdominal:  ?   General: Bowel sounds are decreased. There is distension.  ?   Palpations: Abdomen is soft.  ?   Tenderness: There is abdominal tenderness.  ?   Comments: Patient with tender swollen area above umbilicus ?Mild diffuse tenderness around this area.  ?Skin: ?   General: Skin is warm and dry.  ?   Comments: Left lower extremity with healing incision was mild diffuse tenderness palpation ?No erythema, fluctuance, or warmth noted ?Pulses intact ?Mass on right wrist patient identifies as prior melanoma  ?Neurological:  ?   General: No focal deficit present.  ?   Mental Status: She is alert.  ?Psychiatric:     ?   Mood and Affect: Mood normal.  ? ? ?ED Results / Procedures / Treatments   ?Labs ?(all labs ordered are listed, but only abnormal results are displayed) ?Labs Reviewed  ?COMPREHENSIVE METABOLIC PANEL - Abnormal; Notable for the following components:  ?    Result Value  ? Glucose, Bld 136 (*)   ? All other components within normal limits  ?RESP PANEL BY RT-PCR (FLU A&B, COVID) [id number]  ?CBC WITH DIFFERENTIAL/PLATELET  ?LIPASE, BLOOD  ? ? ?EKG ?EKG Interpretation ? ?Date/Time:  Tuesday October 20 2021 09:44:40 EDT ?Ventricular Rate:  102 ?PR Interval:  156 ?QRS Duration: 166 ?QT Interval:  411 ?QTC Calculation: 536 ?R Axis:   35 ?Text Interpretation: Sinus tachycardia No significant change since last tracing 29 December? 2021 Confirmed by Pattricia Boss [phone]) on 10/20/2021 10:12:41 AM ? ?Radiology ?CT ABDOMEN PELVIS WO CONTRAST ? ?Result Date: 10/20/2021 ?CLINICAL DATA:  Evaluate for bowel obstruction. EXAM: CT ABDOMEN AND PELVIS WITHOUT CONTRAST TECHNIQUE: Multidetector CT imaging of the abdomen and pelvis was performed following the standard protocol without IV contrast. RADIATION DOSE REDUCTION: This exam was performed  according to the departmental dose-optimization program which includes automated exposure control, adjustment of the mA and/or kV according to patient size and/or use of iterative reconstruction technique. COMPARISON:  CT 07/23/2020 FINDINGS: Lower chest: Scar versus subsegmental atelectasis noted in the posterior right lung base. Hepatobiliary: No focal liver abnormality is seen. No gallstones, gallbladder wall thickening, or biliary dilatation. Pancreas: Unremarkable. No pancreatic ductal dilatation or surrounding inflammatory changes. Spleen: Normal in size without focal abnormality. Adrenals/Urinary Tract: Normal appearance of the adrenal glands. No kidney stones, mass, or hydronephrosis identified bilaterally. Urinary bladder is unremarkable. Stomach/Bowel: Small hiatal hernia. The patient is status post appendectomy. Sigmoid diverticulosis without signs of acute diverticulitis. No bowel wall thickening, inflammation, or distension. Vascular/Lymphatic: Aortic atherosclerosis. No aneurysm. No abdominopelvic adenopathy. Reproductive: Uterus and bilateral adnexa are unremarkable. Other: There is a fat containing umbilical hernia which measures 5.7 x 6.9 cm, image 58/2. There is soft  tissue stranding within the central aspect of this hernia which is a new finding compared with the previous exam, image 59/2. No free fluid or fluid collections.  No signs of pneumoperitoneum. Musculoskeletal: Status post bilateral hip arthroplasty. No acute or suspicious osseous findings. Unchanged mild superior endplate fracture deformity involving the T11 vertebral body. IMPRESSION: 1. No evidence for bowel obstruction. 2. Fat containing umbilical hernia with new soft tissue stranding within the central aspect of the herniated fat. Although nonspecific, the central soft tissue stranding may be seen in the setting of incarceration. Careful clinical correlation is advised. 3. Sigmoid diverticulosis without signs of acute  diverticulitis. 4. Aortic Atherosclerosis (ICD10-I70.0). Electronically Signed   By: Kerby Moors M.D.   On: 10/20/2021 11:44  ? ?DG Chest Port 1 View ? ?Result Date: 10/20/2021 ?CLINICAL DATA:  Nausea and vomiting EXAM: PO

## 2021-10-20 NOTE — ED Notes (Signed)
Call placed to [facility] for transport back to facility ?

## 2021-10-20 NOTE — Consult Note (Signed)
? ? ? ? ?Consult Note ? ?NARY SNEED ?Dec 23, 1945  ?[id number].   ? ?Requesting MD: Dr. Jeanell Sparrow ?Chief Complaint/Reason for Consult: umbilical hernia  ? ?HPI:  ?76 year old female with medical history significant for type II DM diverticulosis, GERD, hiatal hernia, breast cancer, recent ankle fracture now residing at [facility] for rehab. She presented to [facility] emergency department secondary to acute onset abdominal pain which began this morning around 0830.  She has an umbilical hernia that she has known about due to incidental finding on prior CT scan in 2021 but she has never noticed it on her abdomen before and never had issues with it prior.  This morning she did notice a bulge at her umbilicus for the first time.  She has had associated nausea and emesis she thinks is secondary to severity of pain.  No nausea at time of my exam.  Last oral intake was sips of liquids with meds this morning but otherwise last oral intake yesterday.  She takes hydrocodone for musculoskeletal pain and has been on stool softeners and laxatives regularly to prevent constipation. She had a BM yesterday and is passing flatus today. She states she normally has 2-3 regular bowel movements daily.  She has not been able to bear weight on her left ankle and has been using the trapezius to move in bed with increased straining of her abdominal muscles ? ?Work-up in the ED significant for patient afebrile, tachycardia, hypertension, WBC normal, CT scan showing no evidence of bowel obstruction, fat-containing umbilical hernia with new soft tissue stranding within the central aspect of the herniated fat. ? ?General surgery asked to consult in regard to her umbilical hernia. ? ?Prior abdominal surgical history includes open appendectomy ? ?ROS: ?Review of Systems  ?Constitutional:  Negative for chills and fever.  ?Respiratory:  Negative for cough and shortness of breath.   ?Cardiovascular:  Negative for chest pain and palpitations.   ?Gastrointestinal:  Positive for abdominal pain, nausea and vomiting. Negative for blood in stool, constipation, diarrhea and melena.  ? ?Family History  ?Problem Relation Age of Onset  ? Hypertension Mother   ? Hyperlipidemia Mother   ? Alzheimer's disease Mother   ? Emphysema Father   ? COPD Father   ? ? ?Past Medical History:  ?Diagnosis Date  ? Asymptomatic varicose veins   ? Cancer [facility])   ? breast   ? Diverticulosis   ? DM type 2 (diabetes mellitus, type 2) ([place]) 10/23/2014  ? Dysuria   ? GERD (gastroesophageal reflux disease)   ? Hiatal hernia   ? Hyperlipidemia   ? Malignant neoplasm of breast (female), unspecified site   ? Migraine without aura, without mention of intractable migraine without mention of status migrainosus   ? Other abnormal blood chemistry   ? Pain in joint, pelvic region and thigh   ? Shingles   ? in eyes  ? Stricture and stenosis of esophagus   ? Trigger finger (acquired)   ? Unspecified cataract   ? Unspecified glaucoma(365.9)   ? ? ?Past Surgical History:  ?Procedure Laterality Date  ? APPENDECTOMY  1973  ? Dr. Linzie Collin  ? BREAST LUMPECTOMY Right 08/29/2008  ? Dr. Marcello Moores Cornett  ? ESOPHAGEAL DILATION    ? NASAL POLYP EXCISION  10/2015  ? ORIF ANKLE FRACTURE Left 08/22/2021  ? Procedure: OPEN REDUCTION INTERNAL FIXATION (ORIF) LEFT ANKLE FRACTURE;  Surgeon: Armond Hang, MD;  Location: [facility];  Service: Orthopedics;  Laterality: Left;  ? POLYPECTOMY  11/20/14  ?  Uterine Polyp Removed  ? ? ?Social History:  reports that she has never smoked. She has never used smokeless tobacco. She reports that she does not drink alcohol and does not use drugs. ? ?Allergies:  ?Allergies  ?Allergen Reactions  ? Adhesive [Tape] Dermatitis and Other (See Comments)  ?  Makes the skin "blood red"  ? Cymbalta [Duloxetine Hcl] Other (See Comments)  ?  sleepiness  ? Gabapentin Other (See Comments)  ?  Sleepiness ?  ? Betimol [Timolol Maleate] Other (See Comments)  ?  Allergic to preservatives - redness and  affects eye pressure  ? Cosopt [Dorzolamide Hcl-Timolol Mal] Other (See Comments)  ?  Turns eyes red, increases eye pressure  ? Penicillin G Other (See Comments)  ?  Reaction not recalled  ? Penicillins Other (See Comments)  ?  Reaction not recalled  ? Prednisolone Acetate Other (See Comments)  ?  Caused increased eye pressure  ? Tamoxifen Other (See Comments)  ?  Vaginal bleeding  ? Thimerosal Other (See Comments)  ?  Makes pt eyes red ?  ? Iodine-131 Rash and Other (See Comments)  ?  Dye from PET scan only, states has had since and did ok  ? Latex Rash  ? ? ?(Not in a hospital admission) ? ? ?Blood pressure (!) 161/95, pulse (!) 103, temperature 98.9 ?F (37.2 ?C), temperature source Oral, resp. rate 18, SpO2 94 %. ?Physical Exam: ?General: pleasant, WD, female who is laying in bed in NAD ?HEENT: head is normocephalic, atraumatic.  Sclera are noninjected.  Pupils equal and round. EOMs intact.  Ears and nose without any masses or lesions.  Mouth is pink and moist ?Heart: Palpable radial pulses bilaterally ?Lungs: Respiratory effort nonlabored on room air ?Abd: soft, distention difficult to assess given body habitus, +BS.  Palpable umbilical hernia without overlying erythema or induration.  Tender on exam. ?MSK: all 4 extremities are symmetrical with no cyanosis, clubbing, or edema. ?Left ankle with Steri-Strips intact over healing incision which is C/D/I ?Skin: warm and dry with no masses, lesions, or rashes ?Neuro: Cranial nerves 2-12 grossly intact, sensation is normal throughout ?Psych: A&Ox3 with an appropriate affect.  ? ? ?Results for orders placed or performed during the hospital encounter of 10/20/21 (from the past 48 hour(s))  ?CBC with Differential     Status: None  ? Collection Time: 10/20/21 10:15 AM  ?Result Value Ref Range  ? WBC 7.9 4.0 - 10.5 K/uL  ? RBC 4.37 3.87 - 5.11 MIL/uL  ? Hemoglobin 13.6 12.0 - 15.0 g/dL  ? HCT 41.6 36.0 - 46.0 %  ? MCV 95.2 80.0 - 100.0 fL  ? MCH 31.1 26.0 - 34.0 pg  ? MCHC  32.7 30.0 - 36.0 g/dL  ? RDW 13.7 11.5 - 15.5 %  ? Platelets 286 150 - 400 K/uL  ? nRBC 0.0 0.0 - 0.2 %  ? Neutrophils Relative % 68 %  ? Neutro Abs 5.3 1.7 - 7.7 K/uL  ? Lymphocytes Relative 19 %  ? Lymphs Abs 1.5 0.7 - 4.0 K/uL  ? Monocytes Relative 8 %  ? Monocytes Absolute 0.7 0.1 - 1.0 K/uL  ? Eosinophils Relative 4 %  ? Eosinophils Absolute 0.3 0.0 - 0.5 K/uL  ? Basophils Relative 1 %  ? Basophils Absolute 0.1 0.0 - 0.1 K/uL  ? Immature Granulocytes 0 %  ? Abs Immature Granulocytes 0.02 0.00 - 0.07 K/uL  ?  Comment: Performed at [facility], [address] [place],  [place] [address]  ?Comprehensive metabolic panel     Status: Abnormal  ? Collection Time: 10/20/21 10:15 AM  ?Result Value Ref Range  ? Sodium 136 135 - 145 mmol/L  ? Potassium 3.9 3.5 - 5.1 mmol/L  ? Chloride 99 98 - 111 mmol/L  ? CO2 28 22 - 32 mmol/L  ? Glucose, Bld 136 (H) 70 - 99 mg/dL  ?  Comment: Glucose reference range applies only to samples taken after fasting for at least 8 hours.  ? BUN 12 8 - 23 mg/dL  ? Creatinine, Ser 0.54 0.44 - 1.00 mg/dL  ? Calcium 10.2 8.9 - 10.3 mg/dL  ? Total Protein 8.0 6.5 - 8.1 g/dL  ? Albumin 3.8 3.5 - 5.0 g/dL  ? AST 17 15 - 41 U/L  ? ALT 17 0 - 44 U/L  ? Alkaline Phosphatase 81 38 - 126 U/L  ? Total Bilirubin 0.5 0.3 - 1.2 mg/dL  ? GFR, Estimated >60 >60 mL/min  ?  Comment: (NOTE) ?Calculated using the CKD-EPI Creatinine Equation (2021) ?  ? Anion gap 9 5 - 15  ?  Comment: Performed at [facility], [place] [address], [place], [place] [address]  ?Lipase, blood     Status: None  ? Collection Time: 10/20/21 10:15 AM  ?Result Value Ref Range  ? Lipase 27 11 - 51 U/L  ?  Comment: Performed at [facility], [place] [address], [place], [place] [address]  ? ?DG Chest Port 1 View ? ?Result Date: 10/20/2021 ?CLINICAL DATA:  Nausea and vomiting EXAM: PORTABLE CHEST 1 VIEW COMPARISON:  03/06/2021 FINDINGS: Mild cardiomegaly with central vascular congestion. No  acute focal pneumonia, collapse or consolidation. Negative for edema, CHF, effusion or pneumothorax. Trachea midline. Aorta atherosclerotic. Degenerative changes throughout the spine. IMPRESSION: Stable cardiomegaly withou

## 2021-10-20 NOTE — ED Notes (Signed)
Patient returned back from CT at this time.  °

## 2021-11-03 ENCOUNTER — Telehealth: Payer: Self-pay

## 2021-11-03 NOTE — Telephone Encounter (Addendum)
It appears her fall occurred back in [place]; if she still requires narcotic for that, she needs to call her orthopedist  ?  ?Per Wardell Honour, MD reply.  ? ?Called patient and left message.  ?

## 2021-11-03 NOTE — Telephone Encounter (Signed)
Patient called from [facility] due to recent fall that lead patient in the hospital. Patient is requesting a refill for oxyCODONE, provided from a different provider Dr. Maryland Pink. Patient stated that they will be released from [facility] on Friday 4/14 however, stated that she would be unavailable to come into the office due to pain levels. ? ?Patient treatment agreement on file from 09/24/2020.  ? ?Please advise ?

## 2021-11-04 ENCOUNTER — Telehealth (INDEPENDENT_AMBULATORY_CARE_PROVIDER_SITE_OTHER): Payer: Medicare Other | Admitting: Family

## 2021-11-04 ENCOUNTER — Encounter: Payer: Self-pay | Admitting: Family

## 2021-11-04 DIAGNOSIS — M545 Low back pain, unspecified: Secondary | ICD-10-CM | POA: Diagnosis not present

## 2021-11-04 DIAGNOSIS — M25572 Pain in left ankle and joints of left foot: Secondary | ICD-10-CM | POA: Diagnosis not present

## 2021-11-04 DIAGNOSIS — G8929 Other chronic pain: Secondary | ICD-10-CM | POA: Diagnosis not present

## 2021-11-04 NOTE — Telephone Encounter (Addendum)
[name], CMA scheduled MyChart appointment today 4/12 at 9 am with Dinah.  ?

## 2021-11-04 NOTE — Progress Notes (Signed)
?This service is provided via telemedicine ? ?No vital signs collected/recorded due to the encounter was a telemedicine visit.  ? ?Location of patient (ex: home, work):  "[name]". ? ?Patient consents to a telephone visit:  Yes ? ?Location of the provider (ex: office, home):  [employer] ? ?Name of any referring provider:  Wardell Honour, MD  ? ?Names of all persons participating in the telemedicine service and their role in the encounter:  Patient, [name], [place], [place], [place], NP.   ? ?Time spent on call: 8 minutes spent on the phone with Medical Assistant.  ? ?Location:    ?  ?Place of Service:    ?Provider: Marlowe Sax FNP-C ? ?Wardell Honour, MD ? ?Patient Care Team: ?Wardell Honour, MD as PCP - General (Family Medicine) ?Jettie Booze, MD as PCP - Cardiology (Cardiology) ?Magrinat, Virgie Dad, MD (Inactive) as Consulting Physician (Oncology) ?Erroll Luna, MD as Consulting Physician (General Surgery) ?Harvie Heck, MD as Physician Assistant (Internal Medicine) ?Chyrel Masson, DO as Anesthesiologist (Otolaryngology) ?Clinger, Chrystie Nose, MD (Otolaryngology) ?Turner Daniels, MD as Referring Physician (Internal Medicine) ? ?Extended Emergency Contact Information ?Primary Emergency Contact: [name] ?Address: [place], [place] ?Home Phone: [phone] ?Work Phone: [phone] ?Mobile Phone: [phone] ?Relation: Son ? ?Code Status:  Full Code  ?Goals of care: Advanced Directive information ? ?  11/04/2021  ?  9:09 AM  ?Advanced Directives  ?Does Patient Have a Medical Advance Directive? No  ?Would patient like information on creating a medical advance directive? No - Patient declined  ? ? ? ?Chief Complaint  ?Patient presents with  ? Acute Visit  ?  Patient wants to discuss medications.   ? Concern   ?  Moderate Fall Risk.  ? ? ?HPI:  ?Pt is a 76 y.o. female seen today for an acute visit to  discuss pain medication.  She states currently in [facility] skilled rehab for left ankle fracture.  She states has been taking hydrocodone 10- 325 mg tablet every 6 hours as needed and also takes Extra 1 tablet for breakthrough pain.  States pain on left ankle continues to hurt worse than she has had before. She also takes pain medication due to her chronic osteoarthritis pain in the back.  States plan is for her to be discharged home on Saturday.She requests hydrocodone to be refilled prior to being discharged. Have discussed with her since she is still in skilled rehab that the provider at the facility will reevaluate her pain then prescribe medication prior to her discharge until she is able to follow-up with PCP. ?She will schedule for video visit but she was unable to unmute to the visit was changed to telephone visit. ? ? ?Past Medical History:  ?Diagnosis Date  ? Asymptomatic varicose veins   ? Cancer [facility])   ? breast   ? Diverticulosis   ? DM type 2 (diabetes mellitus, type 2) ([place]) 10/23/2014  ? Dysuria   ? GERD (gastroesophageal reflux disease)   ? Hiatal hernia   ? Hyperlipidemia   ? Malignant neoplasm of breast (female), unspecified site   ? Migraine without aura, without mention of intractable migraine without mention of status migrainosus   ? Other abnormal blood chemistry   ? Pain in joint, pelvic region and thigh   ? Shingles   ? in eyes  ? Stricture and stenosis of esophagus   ?  Trigger finger (acquired)   ? Unspecified cataract   ? Unspecified glaucoma(365.9)   ? ?Past Surgical History:  ?Procedure Laterality Date  ? APPENDECTOMY  1973  ? Dr. Linzie Collin  ? BREAST LUMPECTOMY Right 08/29/2008  ? Dr. Marcello Moores Cornett  ? ESOPHAGEAL DILATION    ? NASAL POLYP EXCISION  10/2015  ? ORIF ANKLE FRACTURE Left 08/22/2021  ? Procedure: OPEN REDUCTION INTERNAL FIXATION (ORIF) LEFT ANKLE FRACTURE;  Surgeon: Armond Hang, MD;  Location: [facility];  Service: Orthopedics;  Laterality: Left;  ? POLYPECTOMY  11/20/14  ? Uterine  Polyp Removed  ? ? ?Allergies  ?Allergen Reactions  ? Adhesive [Tape] Dermatitis and Other (See Comments)  ?  Makes the skin "blood red"  ? Cymbalta [Duloxetine Hcl] Other (See Comments)  ?  sleepiness  ? Gabapentin Other (See Comments)  ?  Sleepiness ?  ? Betimol [Timolol Maleate] Other (See Comments)  ?  Allergic to preservatives - redness and affects eye pressure  ? Cosopt [Dorzolamide Hcl-Timolol Mal] Other (See Comments)  ?  Turns eyes red, increases eye pressure  ? Penicillin G Other (See Comments)  ?  Reaction not recalled  ? Penicillins Other (See Comments)  ?  Reaction not recalled  ? Prednisolone Acetate Other (See Comments)  ?  Caused increased eye pressure  ? Tamoxifen Other (See Comments)  ?  Vaginal bleeding  ? Thimerosal Other (See Comments)  ?  Makes pt eyes red ?  ? Iodine-131 Rash and Other (See Comments)  ?  Pt does need pre-meds for CT contrast per [facility] Radiology 10/20/2021 ?Dye from PET scan only, states has had since and did ok  ? Latex Rash  ? ? ?Outpatient Encounter Medications as of 11/04/2021  ?Medication Sig  ? Calcium-Phosphorus-Vitamin D (CITRACAL +D3 PO) Take 2 tablets by mouth daily with breakfast.  ? Cholecalciferol (VITAMIN D-3) 5000 units TABS Take 5,000 Units by mouth daily.  ? Cyanocobalamin 5000 MCG CAPS Take 5,000 mcg by mouth in the morning.  ? fluorometholone (FML) 0.1 % ophthalmic suspension Place 1 drop into the right eye in the morning.  ? HYDROcodone-acetaminophen (NORCO) 10-325 MG tablet Take 1 tablet by mouth every 4 (four) hours as needed.  ? ibuprofen (ADVIL) 200 MG tablet Take 200 mg by mouth every 6 (six) hours as needed for mild pain or headache.  ? omeprazole (PRILOSEC) 20 MG capsule Take 20-40 mg by mouth See admin instructions. Take 20 mg alternating with 40 mg every other day  ? polyethylene glycol (MIRALAX / GLYCOLAX) 17 g packet Take 17 g by mouth 2 (two) times daily.  ? senna-docusate (SENOKOT-S) 8.6-50 MG tablet Take 1 tablet by mouth 2 (two) times  daily.  ? timolol (TIMOPTIC) 0.5 % ophthalmic solution Place 1 drop into the right eye in the morning and at bedtime.  ? valACYclovir (VALTREX) 1000 MG tablet Take 1,000 mg by mouth every other day.  ? VOLTAREN 1 % GEL Apply 2-4 g topically 4 (four) times daily as needed (for pain (hands, neck, shoulders, etc..)).  ? [DISCONTINUED] acetaminophen (TYLENOL) 325 MG tablet Take 2 tablets (650 mg total) by mouth 3 (three) times daily.  ? [DISCONTINUED] diclofenac (VOLTAREN) 75 MG EC tablet Take 1 tablet by mouth 2 (two) times daily as needed (for pain).  ? [DISCONTINUED] enoxaparin (LOVENOX) 40 MG/0.4ML injection Inject 0.4 mLs (40 mg total) into the skin daily for 24 days.  ? [DISCONTINUED] oxyCODONE (OXY IR/ROXICODONE) 5 MG immediate release tablet Take 1-2 tablets (5-10 mg total)  by mouth every 4 (four) hours as needed for severe pain.  ? ?No facility-administered encounter medications on file as of 11/04/2021.  ? ? ?Review of Systems  ?Constitutional:  Negative for appetite change, chills, fatigue, fever and unexpected weight change.  ?Respiratory:  Negative for cough, chest tightness, shortness of breath and wheezing.   ?Cardiovascular:  Negative for chest pain, palpitations and leg swelling.  ?Musculoskeletal:  Positive for arthralgias and back pain. Negative for gait problem, joint swelling and myalgias.  ?     Reports no weight bearing on left leg   ? ?Immunization History  ?Administered Date(s) Administered  ? Fluad Quad(high Dose 65+) 07/08/2021  ? Influenza Whole 07/26/2010  ? Influenza, High Dose Seasonal PF 05/03/2019, 07/17/2020  ? Influenza,inj,Quad PF,6+ Mos 04/24/2014, 04/01/2015, 05/05/2016  ? Influenza-Unspecified 06/30/2010, 04/25/2013, 05/31/2018  ? PFIZER Comirnaty(Gray Top)Covid-19 Tri-Sucrose Vaccine 09/26/2020  ? PFIZER(Purple Top)SARS-COV-2 Vaccination 08/26/2020  ? Pneumococcal Conjugate-13 10/23/2014  ? Pneumococcal Polysaccharide-23 10/29/2016  ? Td 06/24/2009  ? Tdap 07/26/2008  ? Zoster,  Live 10/17/2013  ? ?Pertinent  Health Maintenance Due  ?Topic Date Due  ? URINE MICROALBUMIN  09/25/2021  ? INFLUENZA VACCINE  02/23/2022  ? DEXA SCAN  Completed  ? COLONOSCOPY (Pts 45-44yr Insurance coverage wi

## 2021-11-04 NOTE — Telephone Encounter (Signed)
Spoke to patient and transferred call to [place], CMA ?

## 2021-11-17 ENCOUNTER — Ambulatory Visit: Payer: Medicare Other | Admitting: Podiatry

## 2022-01-05 ENCOUNTER — Ambulatory Visit: Payer: Medicare Other | Admitting: Podiatry

## 2022-05-17 ENCOUNTER — Ambulatory Visit (INDEPENDENT_AMBULATORY_CARE_PROVIDER_SITE_OTHER): Payer: Medicare Other | Admitting: Podiatry

## 2022-05-17 ENCOUNTER — Encounter: Payer: Self-pay | Admitting: Podiatry

## 2022-05-17 DIAGNOSIS — M79675 Pain in left toe(s): Secondary | ICD-10-CM

## 2022-05-17 DIAGNOSIS — B351 Tinea unguium: Secondary | ICD-10-CM

## 2022-05-17 DIAGNOSIS — E1142 Type 2 diabetes mellitus with diabetic polyneuropathy: Secondary | ICD-10-CM | POA: Diagnosis not present

## 2022-05-17 DIAGNOSIS — M79674 Pain in right toe(s): Secondary | ICD-10-CM | POA: Diagnosis not present

## 2022-05-17 NOTE — Progress Notes (Signed)
Complaint:  Visit Type: Patient returns to my office for continued preventative foot care services. Complaint: Patient states" my third toenails both feet  have grown long and thick and become painful to walk and wear shoes"  The patient presents for preventative foot care services. No changes to ROS.    Podiatric Exam: Vascular: dorsalis pedis and posterior tibial pulses are weakly  palpable bilateral due to foot swelling.. Capillary return is immediate. Temperature gradient is WNL. Skin turgor WNL  Sensorium: Normal Semmes Weinstein monofilament test. Normal tactile sensation bilaterally. Nail Exam: Pt has thick disfigured discolored nails with subungual debris noted first and third third toenails both feet. Ulcer Exam: There is no evidence of ulcer or pre-ulcerative changes or infection. Orthopedic Exam: Muscle tone and strength are WNL. No limitations in general ROM. No crepitus or effusions noted. Foot type and digits show no abnormalities. Bony prominences are unremarkable. Skin: No Porokeratosis. No infection or ulcers  Diagnosis:  Onychomycosis, , Pain in right toe, pain in left toes  Treatment & Plan Procedures and Treatment: Consent by patient was obtained for treatment procedures.   Debridement of mycotic and hypertrophic toenails, 1 through 5 bilateral and clearing of subungual debris. No ulceration, no infection noted.   Return Visit-Office Procedure: Patient instructed to return to the office for a follow up visit 12 weeks  for continued evaluation and treatment.    Gardiner Barefoot DPM

## 2022-08-24 ENCOUNTER — Ambulatory Visit (INDEPENDENT_AMBULATORY_CARE_PROVIDER_SITE_OTHER): Payer: Medicare Other | Admitting: Podiatry

## 2022-08-24 ENCOUNTER — Encounter: Payer: Self-pay | Admitting: Podiatry

## 2022-08-24 DIAGNOSIS — B351 Tinea unguium: Secondary | ICD-10-CM | POA: Diagnosis not present

## 2022-08-24 DIAGNOSIS — M79675 Pain in left toe(s): Secondary | ICD-10-CM | POA: Diagnosis not present

## 2022-08-24 DIAGNOSIS — M79674 Pain in right toe(s): Secondary | ICD-10-CM

## 2022-08-24 DIAGNOSIS — E1142 Type 2 diabetes mellitus with diabetic polyneuropathy: Secondary | ICD-10-CM | POA: Diagnosis not present

## 2022-08-24 NOTE — Progress Notes (Addendum)
Complaint:  Visit Type: Patient returns to my office for continued preventative foot care services. Complaint: Patient states" my third toenails both feet  have grown long and thick and become painful to walk and wear shoes"  The patient presents for preventative foot care services. No changes to ROS.    Podiatric Exam: Vascular: dorsalis pedis and posterior tibial pulses are weakly  palpable bilateral due to foot swelling.. Capillary return is immediate. Temperature gradient is WNL. Skin turgor WNL  Sensorium: Normal Semmes Weinstein monofilament test. Normal tactile sensation bilaterally. Nail Exam: Pt has thick disfigured discolored nails with subungual debris noted first and third third toenails both feet. Ulcer Exam: There is no evidence of ulcer or pre-ulcerative changes or infection. Orthopedic Exam: Muscle tone and strength are WNL. No limitations in general ROM. No crepitus or effusions noted. Foot type and digits show no abnormalities. Bony prominences are unremarkable. Skin: No Porokeratosis. No infection or ulcers  Diagnosis:  Onychomycosis, , Pain in right toe, pain in left toes  Treatment & Plan Procedures and Treatment: Consent by patient was obtained for treatment procedures.   Debridement of mycotic and hypertrophic toenails, 1 through 5 bilateral and clearing of subungual debris. No ulceration, no infection noted.   Return Visit-Office Procedure: Patient instructed to return to the office for a follow up visit 10  weeks  for continued evaluation and treatment.    Gardiner Barefoot DPM

## 2022-09-15 ENCOUNTER — Other Ambulatory Visit: Payer: Self-pay | Admitting: Physician Assistant

## 2022-09-15 DIAGNOSIS — R1310 Dysphagia, unspecified: Secondary | ICD-10-CM

## 2022-10-15 ENCOUNTER — Inpatient Hospital Stay: Admission: RE | Admit: 2022-10-15 | Payer: Medicare Other | Source: Ambulatory Visit

## 2022-10-29 ENCOUNTER — Ambulatory Visit
Admission: RE | Admit: 2022-10-29 | Discharge: 2022-10-29 | Disposition: A | Discharge status: Home / Self Care | Payer: Medicare Other | Source: Ambulatory Visit | Attending: Physician Assistant | Admitting: Physician Assistant

## 2022-10-29 DIAGNOSIS — R1310 Dysphagia, unspecified: Secondary | ICD-10-CM

## 2022-11-02 ENCOUNTER — Ambulatory Visit: Payer: Medicare Other | Admitting: Podiatry

## 2022-11-05 ENCOUNTER — Telehealth: Payer: Self-pay

## 2022-11-05 NOTE — Telephone Encounter (Signed)
Called [atient to schedule AWV ,no answer and no voicemial. Will need to see if we are still patient's PCP. Patient's last visit 11/04/2021

## 2022-11-08 ENCOUNTER — Encounter: Payer: Self-pay | Admitting: Podiatry

## 2022-11-08 ENCOUNTER — Ambulatory Visit (INDEPENDENT_AMBULATORY_CARE_PROVIDER_SITE_OTHER): Payer: Medicare Other | Admitting: Podiatry

## 2022-11-08 DIAGNOSIS — B351 Tinea unguium: Secondary | ICD-10-CM | POA: Diagnosis not present

## 2022-11-08 DIAGNOSIS — M79675 Pain in left toe(s): Secondary | ICD-10-CM

## 2022-11-08 DIAGNOSIS — M79674 Pain in right toe(s): Secondary | ICD-10-CM | POA: Diagnosis not present

## 2022-11-08 DIAGNOSIS — E1142 Type 2 diabetes mellitus with diabetic polyneuropathy: Secondary | ICD-10-CM

## 2022-11-08 NOTE — Progress Notes (Signed)
Complaint:  Visit Type: Patient returns to my office for continued preventative foot care services. Complaint: Patient states" my third toenails both feet  have grown long and thick and become painful to walk and wear shoes"  The patient presents for preventative foot care services. No changes to ROS.    Podiatric Exam: Vascular: dorsalis pedis and posterior tibial pulses are weakly  palpable bilateral due to foot swelling.. Capillary return is immediate. Temperature gradient is WNL. Skin turgor WNL  Sensorium: Normal Semmes Weinstein monofilament test. Normal tactile sensation bilaterally. Nail Exam: Pt has thick disfigured discolored nails with subungual debris noted first and third third toenails both feet. Ulcer Exam: There is no evidence of ulcer or pre-ulcerative changes or infection. Orthopedic Exam: Muscle tone and strength are WNL. No limitations in general ROM. No crepitus or effusions noted. Foot type and digits show no abnormalities. Bony prominences are unremarkable. Skin: No Porokeratosis. No infection or ulcers  Diagnosis:  Onychomycosis, , Pain in right toe, pain in left toes  Treatment & Plan Procedures and Treatment: Consent by patient was obtained for treatment procedures.   Debridement of mycotic and hypertrophic toenails, 1 through 5 bilateral and clearing of subungual debris. No ulceration, no infection noted.   Return Visit-Office Procedure: Patient instructed to return to the office for a follow up visit 10  weeks  for continued evaluation and treatment.    Herby Amick DPM 

## 2023-01-19 ENCOUNTER — Encounter: Payer: Self-pay | Admitting: Podiatry

## 2023-01-19 ENCOUNTER — Ambulatory Visit (INDEPENDENT_AMBULATORY_CARE_PROVIDER_SITE_OTHER): Payer: Medicare Other | Admitting: Podiatry

## 2023-01-19 DIAGNOSIS — E1142 Type 2 diabetes mellitus with diabetic polyneuropathy: Secondary | ICD-10-CM

## 2023-01-19 DIAGNOSIS — M79674 Pain in right toe(s): Secondary | ICD-10-CM

## 2023-01-19 DIAGNOSIS — M79675 Pain in left toe(s): Secondary | ICD-10-CM | POA: Diagnosis not present

## 2023-01-19 DIAGNOSIS — B351 Tinea unguium: Secondary | ICD-10-CM

## 2023-01-19 NOTE — Progress Notes (Signed)
Complaint:  Visit Type: Patient returns to my office for continued preventative foot care services. Complaint: Patient states" my third toenails both feet  have grown long and thick and become painful to walk and wear shoes"  The patient presents for preventative foot care services. No changes to ROS.    Podiatric Exam: Vascular: dorsalis pedis and posterior tibial pulses are weakly  palpable bilateral due to foot swelling.. Capillary return is immediate. Temperature gradient is WNL. Skin turgor WNL  Sensorium: Normal Semmes Weinstein monofilament test. Normal tactile sensation bilaterally. Nail Exam: Pt has thick disfigured discolored nails with subungual debris noted first and third third toenails both feet. Ulcer Exam: There is no evidence of ulcer or pre-ulcerative changes or infection. Orthopedic Exam: Muscle tone and strength are WNL. No limitations in general ROM. No crepitus or effusions noted. Foot type and digits show no abnormalities. Bony prominences are unremarkable. Skin: No Porokeratosis. No infection or ulcers  Diagnosis:  Onychomycosis, , Pain in right toe, pain in left toes  Treatment & Plan Procedures and Treatment: Consent by patient was obtained for treatment procedures.   Debridement of mycotic and hypertrophic toenails, 1 through 5 bilateral and clearing of subungual debris. No ulceration, no infection noted.   Return Visit-Office Procedure: Patient instructed to return to the office for a follow up visit 10  weeks  for continued evaluation and treatment.    Braxton Weisbecker DPM 

## 2023-02-10 ENCOUNTER — Encounter: Payer: Self-pay | Admitting: Interventional Cardiology

## 2023-02-10 ENCOUNTER — Ambulatory Visit: Payer: Medicare Other | Attending: Interventional Cardiology | Admitting: Interventional Cardiology

## 2023-02-10 VITALS — BP 130/70 | HR 83 | Ht 64.0 in | Wt 258.0 lb

## 2023-02-10 DIAGNOSIS — R0602 Shortness of breath: Secondary | ICD-10-CM | POA: Diagnosis present

## 2023-02-10 DIAGNOSIS — I447 Left bundle-branch block, unspecified: Secondary | ICD-10-CM

## 2023-02-10 DIAGNOSIS — E875 Hyperkalemia: Secondary | ICD-10-CM

## 2023-02-10 DIAGNOSIS — R1314 Dysphagia, pharyngoesophageal phase: Secondary | ICD-10-CM | POA: Diagnosis present

## 2023-02-10 NOTE — Progress Notes (Signed)
Cardiology Office Note   Date:  02/10/2023   ID:  Katherine, [name] 05/22/1946, MRN [id number]  PCP:  Frederica Kuster, MD    No chief complaint on file.    Wt Readings from Last 3 Encounters:  02/10/23 258 lb (117 kg)  08/22/21 258 lb (117 kg)  01/27/21 264 lb (119.7 kg)       History of Present Illness: [name] is a 77 y.o. female  with prior h/o chest pain who I saw in 2022.   In 2022: "Had an episode of chest pain after eating.  It lasted 9 hours and then she called EMS.  Troponins were negative when she went to the ER.  Her BP was very high.  Chest CT was negative for dissection.: "The thoracic aorta is unremarkable without aneurysm or dissection. Minimal atherosclerosis.   The heart is unremarkable without pericardial effusion. While not optimized for the opacification of the pulmonary vasculature, there is sufficient contrast enhancement to exclude pulmonary emboli."   SHe is being considered for EGD with stretching.  She needs cardiac clearance prior to EGD.     She had general anesthesia twice in 2021 for hip surgery.  SHe has stage 4 melanoma.     SHe gained 40 lbs in 2021.    No family h/o CAD. More cancer in the family.    Never smoked."  In 2024, there was a concern about her EKG at an outpatient surgical center.  She was referred back for further evaluation.  T waves were increased.  She had a BMet and potassium was > 6.  She has had swelling managed with potassium supplement and diuretics.  She eats a lot of potassium rich foods.  She stopped these potassium supplement after seeing the high reading.  She has been told she had an enlarged heart.  Past Medical History:  Diagnosis Date   Asymptomatic varicose veins    Cancer (HCC)    breast    Diverticulosis    DM type 2 (diabetes mellitus, type 2) (HCC) 10/23/2014   Dysuria    GERD (gastroesophageal reflux disease)    Hiatal hernia    Hyperlipidemia    Malignant neoplasm of breast  (female), unspecified site    Migraine without aura, without mention of intractable migraine without mention of status migrainosus    Other abnormal blood chemistry    Pain in joint, pelvic region and thigh    Shingles    in eyes   Stricture and stenosis of esophagus    Trigger finger (acquired)    Unspecified cataract    Unspecified glaucoma(365.9)     Past Surgical History:  Procedure Laterality Date   APPENDECTOMY  1973   Dr. Wilber Bihari   BREAST LUMPECTOMY Right 08/29/2008   Dr. Maisie Fus Cornett   ESOPHAGEAL DILATION     NASAL POLYP EXCISION  10/2015   ORIF ANKLE FRACTURE Left 08/22/2021   Procedure: OPEN REDUCTION INTERNAL FIXATION (ORIF) LEFT ANKLE FRACTURE;  Surgeon: Netta Cedars, MD;  Location: [facility] OR;  Service: Orthopedics;  Laterality: Left;   POLYPECTOMY  11/20/14   Uterine Polyp Removed     Current Outpatient Medications  Medication Sig Dispense Refill   Calcium-Phosphorus-Vitamin D (CITRACAL +D3 PO) Take 2 tablets by mouth daily with breakfast.     Cholecalciferol (VITAMIN D-3) 5000 units TABS Take 5,000 Units by mouth daily.     Cyanocobalamin 5000 MCG CAPS Take 5,000 mcg by mouth in the morning.  fluorometholone (FML) 0.1 % ophthalmic suspension Place 1 drop into the right eye in the morning.     hydrochlorothiazide (HYDRODIURIL) 25 MG tablet Take 25 mg by mouth daily.     HYDROcodone-acetaminophen (NORCO) 10-325 MG tablet Take 1 tablet by mouth every 6 (six) hours as needed for severe pain.     ibuprofen (ADVIL) 200 MG tablet Take 200 mg by mouth as needed for mild pain or headache.     omeprazole (PRILOSEC) 40 MG capsule Take 40 mg by mouth daily.     phenazopyridine (AZO-STANDARD) 95 MG tablet Take 1 tablet by mouth as needed.     polyethylene glycol (MIRALAX / GLYCOLAX) 17 g packet Take 17 g by mouth 2 (two) times daily. (Patient taking differently: Take 17 g by mouth as needed for moderate constipation.) 14 each 0   potassium chloride (KLOR-CON) 10 MEQ tablet  daily at 6 (six) AM.     timolol (TIMOPTIC) 0.5 % ophthalmic solution Place 1 drop into the right eye in the morning and at bedtime.     torsemide (DEMADEX) 10 MG tablet Take 10 mg by mouth daily.     valACYclovir (VALTREX) 1000 MG tablet Take 1,000 mg by mouth every other day.     VOLTAREN 1 % GEL Apply 2-4 g topically 4 (four) times daily as needed (for pain (hands, neck, shoulders, etc..)).     No current facility-administered medications for this visit.    Allergies:   Adhesive [tape], Cymbalta [duloxetine hcl], Gabapentin, Betimol [timolol maleate], Cosopt [dorzolamide hcl-timolol mal], Penicillin g, Penicillins, Prednisolone acetate, Tamoxifen, Thimerosal (thiomersal), Iodine-131, and Latex    Social History:  The patient  reports that she has never smoked. She has never used smokeless tobacco. She reports that she does not drink alcohol and does not use drugs.   Family History:  The patient's family history includes Alzheimer's disease in her mother; COPD in her father; Emphysema in her father; Hyperlipidemia in her mother; Hypertension in her mother.    ROS:  Please see the history of present illness.   Otherwise, review of systems are positive for LE edema.   All other systems are reviewed and negative.    PHYSICAL EXAM: VS:  BP 130/70   Pulse 83   Ht 5\' 4"  (1.626 m)   Wt 258 lb (117 kg)   BMI 44.29 kg/m  , BMI Body mass index is 44.29 kg/m. GEN: Well nourished, well developed, in no acute distress HEENT: normal Neck: no JVD, carotid bruits, or masses Cardiac: RRR; no murmurs, rubs, or gallops,; mild ankle edema  Respiratory:  clear to auscultation bilaterally, normal work of breathing GI: soft, nontender, nondistended, + BS MS: no deformity or atrophy Skin: warm and dry, no rash Neuro:  Strength and sensation are intact Psych: euthymic mood, full affect   EKG:   The ekg ordered today demonstrates normal sinus rhythm, left bundle branch block, T wave is  normal   Recent Labs: No results found for requested labs within last 365 days.   Lipid Panel    Component Value Date/Time   CHOL 265 (H) 09/22/2020 1132   CHOL 229 (H) 03/27/2015 1041   TRIG 217 (H) 09/22/2020 1132   HDL 67 09/22/2020 1132   HDL 64 03/27/2015 1041   CHOLHDL 4.0 09/22/2020 1132   VLDL 30 10/29/2016 1049   LDLCALC 159 (H) 09/22/2020 1132     Other studies Reviewed: Additional studies/ records that were reviewed today with results demonstrating: strips reviewed  from the surgical center.  LDL 75 in June 2023, HDL 35, creatinine 1.1 in April 2023.   ASSESSMENT AND PLAN:  Coronary atherosclerosis: Minimial by prior CT. had atypical chest pain in the past.  No ischemic evaluation was done and she tolerated surgery a few years ago. No chest pain currently.  ECG has returned to baseline.  T wave amplitude normal today.  Increased when potassium was reportedly higher. DOE: given question of enlarged heart, echocardiogram will be useful to sort out if there is any issue with the size of the heart.  Check BNP.  She is taking her diuretic 3-4 times a week.  If she takes it more frequently, she has symptoms of cystitis. Elevated BP in the past: Readings normal. LE edema: venous insufficiency.  Elevate legs.  Can use compression stockings as well.   LBBB: noted on multiple ECGs dating back to 2021. Hyperkalemia: Has been on Demadex and potassium.  Potassium supplement stopped after the higher reading but the patient herself.  Needs a repeat BMet.    Current medicines are reviewed at length with the patient today.  The patient concerns regarding her medicines were addressed.  The following changes have been made:  No change  Labs/ tests ordered today include:   Orders Placed This Encounter  Procedures   EKG 12-Lead    Recommend 150 minutes/week of aerobic exercise Low fat, low carb, high fiber diet recommended  Disposition:   FU as needed   Signed, Lance Muss, MD  02/10/2023 5:12 PM    [facility] [address] [place], [place], [place]  [address] Phone: [phone]; Fax: [phone]

## 2023-02-10 NOTE — Patient Instructions (Signed)
Medication Instructions:  Your physician recommends that you continue on your current medications as directed. Please refer to the Current Medication list given to you today.  *If you need a refill on your cardiac medications before your next appointment, please call your pharmacy*  Lab Work: BMET, BNP, CBC-SAME DAY AS ECHO If you have labs (blood work) drawn today and your tests are completely normal, you will receive your results only by: MyChart Message (if you have MyChart) OR A paper copy in the mail If you have any lab test that is abnormal or we need to change your treatment, we will call you to review the results.   Testing/Procedures: Your physician has requested that you have an echocardiogram. Echocardiography is a painless test that uses sound waves to create images of your heart. It provides your doctor with information about the size and shape of your heart and how well your heart's chambers and valves are working. This procedure takes approximately one hour. There are no restrictions for this procedure. Please do NOT wear cologne, perfume, aftershave, or lotions (deodorant is allowed). Please arrive 15 minutes prior to your appointment time.    Follow-Up: At [facility], you and your health needs are our priority.  As part of our continuing mission to provide you with exceptional heart care, we have created designated Provider Care Teams.  These Care Teams include your primary Cardiologist (physician) and Advanced Practice Providers (APPs -  Physician Assistants and Nurse Practitioners) who all work together to provide you with the care you need, when you need it.   Your next appointment:   Follow up as needed

## 2023-03-10 ENCOUNTER — Ambulatory Visit (HOSPITAL_COMMUNITY): Payer: Medicare Other | Attending: Interventional Cardiology

## 2023-03-10 ENCOUNTER — Ambulatory Visit: Payer: Medicare Other

## 2023-03-10 DIAGNOSIS — R0602 Shortness of breath: Secondary | ICD-10-CM | POA: Insufficient documentation

## 2023-03-10 DIAGNOSIS — R1314 Dysphagia, pharyngoesophageal phase: Secondary | ICD-10-CM | POA: Diagnosis present

## 2023-03-10 LAB — ECHOCARDIOGRAM COMPLETE
Area-P 1/2: 5.13 cm2
Calc EF: 44.1 %
S' Lateral: 4.6 cm
Single Plane A2C EF: 41.9 %
Single Plane A4C EF: 41.8 %

## 2023-03-11 LAB — CBC
Hematocrit: 37.2 % (ref 34.0–46.6)
Hemoglobin: 12.3 g/dL (ref 11.1–15.9)
MCH: 31.1 pg (ref 26.6–33.0)
MCHC: 33.1 g/dL (ref 31.5–35.7)
MCV: 94 fL (ref 79–97)
Platelets: 291 10*3/uL (ref 150–450)
RBC: 3.95 x10E6/uL (ref 3.77–5.28)
RDW: 12.3 % (ref 11.7–15.4)
WBC: 7.9 10*3/uL (ref 3.4–10.8)

## 2023-03-11 LAB — BASIC METABOLIC PANEL
BUN/Creatinine Ratio: 21 (ref 12–28)
BUN: 17 mg/dL (ref 8–27)
CO2: 26 mmol/L (ref 20–29)
Calcium: 10 mg/dL (ref 8.7–10.3)
Chloride: 95 mmol/L — ABNORMAL LOW (ref 96–106)
Creatinine, Ser: 0.82 mg/dL (ref 0.57–1.00)
Glucose: 114 mg/dL — ABNORMAL HIGH (ref 70–99)
Potassium: 4.3 mmol/L (ref 3.5–5.2)
Sodium: 138 mmol/L (ref 134–144)
eGFR: 74 mL/min/{1.73_m2} (ref >59)

## 2023-03-11 LAB — PRO B NATRIURETIC PEPTIDE: NT-Pro BNP: 433 pg/mL (ref 0–738)

## 2023-03-17 NOTE — Progress Notes (Signed)
Cardiology Office Note   Date:  03/18/2023   ID:  Navi, Polis 1945/08/25, MRN [id number]  PCP:  No primary care provider on file.    No chief complaint on file.  Coronary calcification  Wt Readings from Last 3 Encounters:  03/18/23 262 lb 12.8 oz (119.2 kg)  02/10/23 258 lb (117 kg)  08/22/21 258 lb (117 kg)       History of Present Illness: [name] is a 77 y.o. female    with prior h/o chest pain who I saw in 2022.   2020 echo at [facility] showed normal LVEF.   Chronic swelling.  Has had severe injuries to both legs.    2022 CT: "Cardiovascular: The thoracic aorta is unremarkable without aneurysm or dissection. Minimal atherosclerosis.     In 2022: "Had an episode of chest pain after eating.  It lasted 9 hours and then she called EMS.  Troponins were negative when she went to the ER.  Her BP was very high.  Chest CT was negative for dissection.: "The thoracic aorta is unremarkable without aneurysm or dissection. Minimal atherosclerosis.   The heart is unremarkable without pericardial effusion. While not optimized for the opacification of the pulmonary vasculature, there is sufficient contrast enhancement to exclude pulmonary emboli."   SHe is being considered for EGD with stretching.  She needs cardiac clearance prior to EGD.     She had general anesthesia twice in 2021 for hip surgery.  SHe has stage 4 melanoma.      SHe gained 40 lbs in 2021.    No family h/o CAD. More cancer in the family.    Never smoked."   In 2024, there was a concern about her EKG at an outpatient surgical center.  She was referred back for further evaluation.  T waves were increased.  She had a BMet and potassium was > 6.  She has had swelling managed with potassium supplement and diuretics.  She eats a lot of potassium rich foods.  She stopped these potassium supplement after seeing the high reading.   She has been told she had an enlarged heart.  Prior CT had shown  minimal coronary atherosclerosis. Echocardiogram from August 2024 showed: "Left ventricular ejection fraction, by estimation, is 20 to 25%. The  left ventricle has severely decreased function. The left ventricle  demonstrates global hypokinesis. Left ventricular diastolic parameters are  consistent with Grade I diastolic  dysfunction (impaired relaxation). Elevated left atrial pressure. The  average left ventricular global longitudinal strain is -13.3 %. The global  longitudinal strain is abnormal.   2. Right ventricular systolic function is normal. The right ventricular  size is normal.   3. The mitral valve is normal in structure. Mild mitral valve  regurgitation.   4. The aortic valve is tricuspid. Aortic valve regurgitation is not  visualized. "    Past Medical History:  Diagnosis Date   Asymptomatic varicose veins    Cancer (HCC)    breast    Diverticulosis    DM type 2 (diabetes mellitus, type 2) (HCC) 10/23/2014   Dysuria    GERD (gastroesophageal reflux disease)    Hiatal hernia    Hyperlipidemia    Malignant neoplasm of breast (female), unspecified site    Migraine without aura, without mention of intractable migraine without mention of status migrainosus    Other abnormal blood chemistry    Pain in joint, pelvic region and thigh    Shingles  in eyes   Stricture and stenosis of esophagus    Trigger finger (acquired)    Unspecified cataract    Unspecified glaucoma(365.9)     Past Surgical History:  Procedure Laterality Date   APPENDECTOMY  1973   Dr. Wilber Bihari   BREAST LUMPECTOMY Right 08/29/2008   Dr. Harriette Bouillon   ESOPHAGEAL DILATION     NASAL POLYP EXCISION  10/2015   ORIF ANKLE FRACTURE Left 08/22/2021   Procedure: OPEN REDUCTION INTERNAL FIXATION (ORIF) LEFT ANKLE FRACTURE;  Surgeon: Netta Cedars, MD;  Location: [facility] OR;  Service: Orthopedics;  Laterality: Left;   POLYPECTOMY  11/20/14   Uterine Polyp Removed     Current Outpatient Medications   Medication Sig Dispense Refill   Calcium-Phosphorus-Vitamin D (CITRACAL +D3 PO) Take 2 tablets by mouth daily with breakfast.     Cholecalciferol (VITAMIN D-3) 5000 units TABS Take 5,000 Units by mouth daily.     Cyanocobalamin 5000 MCG CAPS Take 5,000 mcg by mouth in the morning.     fluorometholone (FML) 0.1 % ophthalmic suspension Place 1 drop into the right eye in the morning.     hydrochlorothiazide (HYDRODIURIL) 25 MG tablet Take 25 mg by mouth daily.     HYDROcodone-acetaminophen (NORCO) 10-325 MG tablet Take 1 tablet by mouth every 6 (six) hours as needed for severe pain.     ibuprofen (ADVIL) 200 MG tablet Take 200 mg by mouth as needed for mild pain or headache.     omeprazole (PRILOSEC) 40 MG capsule Take 40 mg by mouth daily.     phenazopyridine (AZO-STANDARD) 95 MG tablet Take 1 tablet by mouth as needed.     polyethylene glycol (MIRALAX / GLYCOLAX) 17 g packet Take 17 g by mouth 2 (two) times daily. (Patient taking differently: Take 17 g by mouth as needed for moderate constipation.) 14 each 0   timolol (TIMOPTIC) 0.5 % ophthalmic solution Place 1 drop into the right eye in the morning and at bedtime.     torsemide (DEMADEX) 10 MG tablet Take 10 mg by mouth daily.     valACYclovir (VALTREX) 1000 MG tablet Take 1,000 mg by mouth every other day.     VOLTAREN 1 % GEL Apply 2-4 g topically 4 (four) times daily as needed (for pain (hands, neck, shoulders, etc..)).     potassium chloride (KLOR-CON) 10 MEQ tablet daily at 6 (six) AM. (Patient not taking: Reported on 03/18/2023)     No current facility-administered medications for this visit.    Allergies:   Adhesive [tape], Cymbalta [duloxetine hcl], Gabapentin, Betimol [timolol maleate], Cosopt [dorzolamide hcl-timolol mal], Penicillin g, Penicillins, Prednisolone acetate, Tamoxifen, Thimerosal (thiomersal), Iodine-131, and Latex    Social History:  The patient  reports that she has never smoked. She has never used smokeless tobacco.  She reports that she does not drink alcohol and does not use drugs.   Family History:  The patient's family history includes Alzheimer's disease in her mother; COPD in her father; Emphysema in her father; Hyperlipidemia in her mother; Hypertension in her mother.    ROS:  Please see the history of present illness.   Otherwise, review of systems are positive for weight gain; leg edema.   All other systems are reviewed and negative.    PHYSICAL EXAM: VS:  BP (!) 144/82   Pulse 90   Ht 5\' 4"  (1.626 m)   Wt 262 lb 12.8 oz (119.2 kg)   SpO2 96%   BMI 45.11 kg/m  , BMI  Body mass index is 45.11 kg/m. GEN: Well nourished, well developed, in no acute distress HEENT: normal Neck: no JVD, carotid bruits, or masses Cardiac: RRR; no murmurs, rubs, or gallops,; bilateral leg edema  Respiratory:  clear to auscultation bilaterally, normal work of breathing GI: soft, nontender, nondistended, + BS MS: no deformity or atrophy Skin: warm and dry, no rash Neuro:  Strength and sensation are intact Psych: euthymic mood, full affect   EKG:   The ekg ordered today demonstrates NSR, LBBB   Recent Labs: 03/10/2023: BUN 17; Creatinine, Ser 0.82; Hemoglobin 12.3; NT-Pro BNP 433; Platelets 291; Potassium 4.3; Sodium 138   Lipid Panel    Component Value Date/Time   CHOL 265 (H) 09/22/2020 1132   CHOL 229 (H) 03/27/2015 1041   TRIG 217 (H) 09/22/2020 1132   HDL 67 09/22/2020 1132   HDL 64 03/27/2015 1041   CHOLHDL 4.0 09/22/2020 1132   VLDL 30 10/29/2016 1049   LDLCALC 159 (H) 09/22/2020 1132     Other studies Reviewed: Additional studies/ records that were reviewed today with results demonstrating: echo results as noted.   ASSESSMENT AND PLAN:   Systolic heart failure: Unclear whether this is acute or chronic.  Enlarged heart noted on prior CT in 2022.  Normal LV function in 2020.  Suspect nonischemic cardiomyopathy.  Plan or CTA coronaries to help determine etiology.   No clear angina. Mild  DOE.  Discussed cath but she would only do this if there were high risk findings on CTA.   She is hoping to avoid cardiac catheterization.  We talked about medical therapy for systolic heart failure.  She is hoping to avoid expensive medication.  Start carvedilol 6.25 twice daily given that this will be inexpensive and will be useful for her coronary CTA.  Depending on insurance coverage, may only be able to use ACE inhibitor or ARB rather than Entresto.  left bundle branch block: Has been intermittent over the years.  This may be contributing to her LV dysfunction.   Coronary atherosclerosis:  minimal by CT in the past.  LE edema.  Plan for CTA coronaries due to low EF rather than cath since she has no angina.  Aortic atherosclerosis: She does not want a statin due to fear of Alzheimer's Hypercholesterolemia: LDL 159 in February 2022 HDL 67 total cholesterol 265 triglycerides 270   Current medicines are reviewed at length with the patient today.  The patient concerns regarding her medicines were addressed.  The following changes have been made:  No change  Labs/ tests ordered today include: CTA coronaries No orders of the defined types were placed in this encounter.   Recommend 150 minutes/week of aerobic exercise Low fat, low carb, high fiber diet recommended  Disposition:   FU in based on CTA results.    Signed, Lance Muss, MD  03/18/2023 12:06 PM    [facility] [address] [place], [place], [place]  [address] Phone: [phone]; Fax: [phone]

## 2023-03-18 ENCOUNTER — Encounter: Payer: Self-pay | Admitting: Interventional Cardiology

## 2023-03-18 ENCOUNTER — Ambulatory Visit: Payer: Medicare Other | Attending: Interventional Cardiology | Admitting: Interventional Cardiology

## 2023-03-18 VITALS — BP 144/82 | HR 90 | Ht 64.0 in | Wt 262.8 lb

## 2023-03-18 DIAGNOSIS — I5020 Unspecified systolic (congestive) heart failure: Secondary | ICD-10-CM | POA: Diagnosis present

## 2023-03-18 DIAGNOSIS — I25118 Atherosclerotic heart disease of native coronary artery with other forms of angina pectoris: Secondary | ICD-10-CM | POA: Insufficient documentation

## 2023-03-18 DIAGNOSIS — R0602 Shortness of breath: Secondary | ICD-10-CM | POA: Insufficient documentation

## 2023-03-18 DIAGNOSIS — I447 Left bundle-branch block, unspecified: Secondary | ICD-10-CM | POA: Diagnosis present

## 2023-03-18 MED ORDER — PREDNISONE 50 MG PO TABS: ORAL_TABLET | ORAL | 0 refills | Status: DC

## 2023-03-18 MED ORDER — CARVEDILOL 6.25 MG PO TABS: 6.2500 mg | ORAL_TABLET | Freq: Two times a day (BID) | ORAL | 3 refills | Status: DC

## 2023-03-18 NOTE — Patient Instructions (Addendum)
Medication Instructions:  Your physician has recommended you make the following change in your medication: Start Carvedilol 6.25 mg by mouth twice daily   *If you need a refill on your cardiac medications before your next appointment, please call your pharmacy*   Lab Work: none If you have labs (blood work) drawn today and your tests are completely normal, you will receive your results only by: MyChart Message (if you have MyChart) OR A paper copy in the mail If you have any lab test that is abnormal or we need to change your treatment, we will call you to review the results.   Testing/Procedures: Your physician has requested that you have cardiac CT. Cardiac computed tomography (CT) is a painless test that uses an x-ray machine to take clear, detailed pictures of your heart. For further information please visit [email or web address] Please follow instruction sheet as given.     Follow-Up: At [facility], you and your health needs are our priority.  As part of our continuing mission to provide you with exceptional heart care, we have created designated Provider Care Teams.  These Care Teams include your primary Cardiologist (physician) and Advanced Practice Providers (APPs -  Physician Assistants and Nurse Practitioners) who all work together to provide you with the care you need, when you need it.  We recommend signing up for the patient portal called "MyChart".  Sign up information is provided on this After Visit Summary.  MyChart is used to connect with patients for Virtual Visits (Telemedicine).  Patients are able to view lab/test results, encounter notes, upcoming appointments, etc.  Non-urgent messages can be sent to your provider as well.   To learn more about what you can do with MyChart, go to [email or web address].    Your next appointment:   Based on results  Provider:   Lance Muss, MD     Other Instructions   Your cardiac CT will be scheduled at one of  the below locations:   [facility] [address] [place], [place] [address] [phone]  OR  [facility] [address] [place], [place] [address] [phone]  OR   [facility] [address] [place], [place] [address] [phone]  If scheduled at [facility], please arrive at the [facility] and Children's Entrance (Entrance C2) of [facility] 30 minutes prior to test start time. You can use the FREE valet parking offered at entrance C (encouraged to control the heart rate for the test)  Proceed to the [facility] Radiology Department (first floor) to check-in and test prep.  All radiology patients and guests should use entrance C2 at [facility], accessed from [facility], even though the hospital's physical address listed is [address].    If scheduled at [facility] or [facility], please arrive 15 mins early for check-in and test prep.  There is spacious parking and easy access to the radiology department from the [facility] entrance. Please enter here and check-in with the desk attendant.   Please follow these instructions carefully (unless otherwise directed):  An IV will be required for this test and Nitroglycerin will be given.  Hold all erectile dysfunction medications at least 3 days (72 hrs) prior to test. (Ie viagra, cialis, sildenafil, tadalafil, etc)   On the Night Before the Test: Be sure to Drink plenty of water. Do not consume any caffeinated/decaffeinated beverages or  chocolate 12 hours prior to your test. Do not take any antihistamines 12 hours prior to your test. If the patient has contrast allergy: Patient will need a prescription for Prednisone and very clear instructions (as follows): Prednisone 50 mg - take 13 hours prior to test Take another Prednisone 50 mg  7 hours prior to test Take another Prednisone 50 mg 1 hour prior to test Take Benadryl 50 mg 1 hour prior to test Patient must complete all four doses of above prophylactic medications. Patient will need a ride after test due to Benadryl.  On the Day of the Test: Drink plenty of water until 1 hour prior to the test. Do not eat any food 1 hour prior to test. You may take your regular medications prior to the test.  Take your morning dose of Carvedilol 6.25 mg 2 hours prior to CT Scan If you take Furosemide/Hydrochlorothiazide/Spironolactone, please HOLD on the morning of the test.  Do not take torsemide the morning of the CT Scan FEMALES- please wear underwire-free bra if available, avoid dresses & tight clothing After the Test: Drink plenty of water. After receiving IV contrast, you may experience a mild flushed feeling. This is normal. On occasion, you may experience a mild rash up to 24 hours after the test. This is not dangerous. If this occurs, you can take Benadryl 25 mg and increase your fluid intake. If you experience trouble breathing, this can be serious. If it is severe call 911 IMMEDIATELY. If it is mild, please call our office. If you take any of these medications: Glipizide/Metformin, Avandament, Glucavance, please do not take 48 hours after completing test unless otherwise instructed.  We will call to schedule your test 2-4 weeks out understanding that some insurance companies will need an authorization prior to the service being performed.   For more information and frequently asked questions, please visit our website : [email or web address]  For non-scheduling related questions, please contact the cardiac imaging nurse navigator should you have any questions/concerns: Cardiac Imaging Nurse Navigators Direct Office Dial: [phone]   For scheduling needs, including cancellations and rescheduling, please call [place], [phone].

## 2023-03-25 ENCOUNTER — Telehealth (HOSPITAL_COMMUNITY): Payer: Self-pay | Admitting: *Deleted

## 2023-03-25 NOTE — Telephone Encounter (Signed)
Reaching out to patient to offer assistance regarding upcoming cardiac imaging study; pt verbalizes understanding of appt date/time, parking situation and where to check in, pre-test NPO status and medications ordered, and verified current allergies; name and call back number provided for further questions should they arise Katherine Frame RN Navigator Cardiac Imaging [facility] [phone] office [phone] cell  Patient confirms no allergy to IV contrast.   BP/ HR this morning prior to carvedilol 129/70 HR 67.  Patient will take 6.25mg  carvedilol 2 hours prior to CT scan.

## 2023-03-29 ENCOUNTER — Ambulatory Visit (HOSPITAL_COMMUNITY)
Admission: RE | Admit: 2023-03-29 | Discharge: 2023-03-29 | Disposition: A | Discharge status: Home / Self Care | Payer: Medicare Other | Source: Ambulatory Visit | Attending: Interventional Cardiology | Admitting: Interventional Cardiology

## 2023-03-29 DIAGNOSIS — I25118 Atherosclerotic heart disease of native coronary artery with other forms of angina pectoris: Secondary | ICD-10-CM

## 2023-03-29 DIAGNOSIS — R0602 Shortness of breath: Secondary | ICD-10-CM

## 2023-03-29 DIAGNOSIS — I447 Left bundle-branch block, unspecified: Secondary | ICD-10-CM

## 2023-03-29 DIAGNOSIS — I5020 Unspecified systolic (congestive) heart failure: Secondary | ICD-10-CM

## 2023-03-29 NOTE — Progress Notes (Signed)
Pt arrived for cardiac CT as scheduled. This RN confirmed pt. ID and allergies. Allergy list states under Iodine-131 allergy: Pt does need pre-meds for CT contrast per [facility] Radiology 10/20/2021. No pre-medication taken by pt per RN navigator - see previous note.   This RN spoke to [employer], RN regarding difference between allergy list statement and no pre-medication for patient. This RN advised to contact CT department and radiologist for clearance. CT department notified of allergy concern. Dr. Mayford Knife called by this RN and notified of concern - Dr. Mayford Knife advised to cancel this procedure and reschedule with pre-medication due to clear statement in allergy list. RN navigators notified by secure chat of need to reschedule.   Pt. Educated, expressed frustration at situation due to opposing messages prior to this scan. Pt. Expressed understanding and discharged from radiology. No VS taken  nor IV attempts made this visit.

## 2023-03-30 ENCOUNTER — Encounter (HOSPITAL_COMMUNITY): Payer: Self-pay

## 2023-03-30 ENCOUNTER — Encounter: Payer: Self-pay | Admitting: Podiatry

## 2023-03-30 ENCOUNTER — Ambulatory Visit: Payer: Medicare Other | Admitting: Podiatry

## 2023-03-30 DIAGNOSIS — B351 Tinea unguium: Secondary | ICD-10-CM | POA: Diagnosis not present

## 2023-03-30 DIAGNOSIS — M79674 Pain in right toe(s): Secondary | ICD-10-CM | POA: Diagnosis not present

## 2023-03-30 DIAGNOSIS — E1142 Type 2 diabetes mellitus with diabetic polyneuropathy: Secondary | ICD-10-CM

## 2023-03-30 DIAGNOSIS — M79675 Pain in left toe(s): Secondary | ICD-10-CM

## 2023-03-30 NOTE — Progress Notes (Signed)
Complaint:  Visit Type: Patient returns to my office for continued preventative foot care services. Complaint: Patient states" my third toenails both feet  have grown long and thick and become painful to walk and wear shoes"  The patient presents for preventative foot care services. No changes to ROS.    Podiatric Exam: Vascular: dorsalis pedis and posterior tibial pulses are weakly  palpable bilateral due to foot swelling.. Capillary return is immediate. Temperature gradient is WNL. Skin turgor WNL  Sensorium: Normal Semmes Weinstein monofilament test. Normal tactile sensation bilaterally. Nail Exam: Pt has thick disfigured discolored nails with subungual debris noted first and third third toenails both feet. Ulcer Exam: There is no evidence of ulcer or pre-ulcerative changes or infection. Orthopedic Exam: Muscle tone and strength are WNL. No limitations in general ROM. No crepitus or effusions noted. Foot type and digits show no abnormalities. Bony prominences are unremarkable. Skin: No Porokeratosis. No infection or ulcers  Diagnosis:  Onychomycosis, , Pain in right toe, pain in left toes  Treatment & Plan Procedures and Treatment: Consent by patient was obtained for treatment procedures.   Debridement of mycotic and hypertrophic toenails, 1 through 5 bilateral and clearing of subungual debris. No ulceration, no infection noted.   Return Visit-Office Procedure: Patient instructed to return to the office for a follow up visit 4 months   for continued evaluation and treatment.    Helane Gunther DPM

## 2023-04-04 ENCOUNTER — Ambulatory Visit (HOSPITAL_COMMUNITY)
Admission: RE | Admit: 2023-04-04 | Discharge: 2023-04-04 | Disposition: A | Discharge status: Home / Self Care | Payer: Medicare Other | Source: Ambulatory Visit | Attending: Interventional Cardiology | Admitting: Interventional Cardiology

## 2023-04-04 DIAGNOSIS — I25118 Atherosclerotic heart disease of native coronary artery with other forms of angina pectoris: Secondary | ICD-10-CM | POA: Insufficient documentation

## 2023-04-04 DIAGNOSIS — I5020 Unspecified systolic (congestive) heart failure: Secondary | ICD-10-CM | POA: Diagnosis not present

## 2023-04-04 DIAGNOSIS — R0602 Shortness of breath: Secondary | ICD-10-CM | POA: Diagnosis not present

## 2023-04-04 DIAGNOSIS — I447 Left bundle-branch block, unspecified: Secondary | ICD-10-CM | POA: Insufficient documentation

## 2023-04-04 MED ORDER — DILTIAZEM HCL 25 MG/5ML IV SOLN
INTRAVENOUS | Status: AC
  Filled 2023-04-04: qty 5

## 2023-04-04 MED ORDER — NITROGLYCERIN 0.4 MG SL SUBL
0.8000 mg | SUBLINGUAL_TABLET | Freq: Once | SUBLINGUAL | Status: AC
  Administered 2023-04-04: 0.8 mg via SUBLINGUAL

## 2023-04-04 MED ORDER — IOHEXOL 350 MG/ML SOLN
95.0000 mL | Freq: Once | INTRAVENOUS | Status: AC | PRN
  Administered 2023-04-04: 95 mL via INTRAVENOUS

## 2023-04-04 MED ORDER — METOPROLOL TARTRATE 5 MG/5ML IV SOLN
5.0000 mg | Freq: Once | INTRAVENOUS | Status: AC
  Administered 2023-04-04: 5 mg via INTRAVENOUS

## 2023-04-04 MED ORDER — DILTIAZEM HCL 25 MG/5ML IV SOLN
5.0000 mg | Freq: Once | INTRAVENOUS | Status: AC
  Administered 2023-04-04: 5 mg via INTRAVENOUS

## 2023-04-04 MED ORDER — NITROGLYCERIN 0.4 MG SL SUBL
SUBLINGUAL_TABLET | SUBLINGUAL | Status: AC
  Filled 2023-04-04: qty 2

## 2023-04-04 MED ORDER — METOPROLOL TARTRATE 5 MG/5ML IV SOLN
INTRAVENOUS | Status: AC
  Filled 2023-04-04: qty 10

## 2023-04-05 ENCOUNTER — Telehealth: Payer: Self-pay | Admitting: *Deleted

## 2023-04-05 DIAGNOSIS — I5020 Unspecified systolic (congestive) heart failure: Secondary | ICD-10-CM

## 2023-04-05 DIAGNOSIS — E785 Hyperlipidemia, unspecified: Secondary | ICD-10-CM

## 2023-04-05 MED ORDER — ROSUVASTATIN CALCIUM 10 MG PO TABS: 10.0000 mg | ORAL_TABLET | Freq: Every day | ORAL | 3 refills | Status: DC

## 2023-04-05 NOTE — Telephone Encounter (Signed)
-----   Message from [place] sent at 04/04/2023  4:41 PM EDT ----- Minimal non-obstructive CAD (0-24%). Consider non-atherosclerotic causes of chest pain. Consider preventive therapy and risk factor modification. Would add rosuvastatin 10 mg daily. Check lipids and liver tests in 3 months

## 2023-04-05 NOTE — Telephone Encounter (Signed)
Patient notified.  Prescription sent to [facility] on [address].  Patient will come in for fasting lab work on December 10 Patient reports since starting Coreg swelling in her legs has improved. She is asking about possible cause of low EF on echo Follow up to be based on test results.  Will check with Dr Eldridge Dace regarding next appointment

## 2023-04-06 NOTE — Telephone Encounter (Signed)
Would keep a log of BPs at home and then f/u in the PharmD clinic for CHF med titration in 2-4 weeks.

## 2023-04-07 NOTE — Telephone Encounter (Signed)
Dr Eldridge Dace would also like patient to follow up with non invasive cardiologist about 2-3 months after seen by PharmD.  Referral placed for PharmD visit.  Left message for patient to call office.

## 2023-04-11 NOTE — Telephone Encounter (Signed)
Patient is returning call, and wanting to know why she needs to see PharmD. Please advise

## 2023-04-11 NOTE — Telephone Encounter (Signed)
I spoke with patient and explained reason for seeing pharmacist.  Appointment made with pharmacist for October 9 ,2024 at 3:30.  Patient will bring BP readings and home BP cuff to this appointment. Patient will follow with Dr Anne Fu or Dr Izora Ribas.  She will call us back to schedule appointment for December or January once she decides who she would like to see

## 2023-05-04 ENCOUNTER — Ambulatory Visit: Payer: Medicare Other | Attending: Cardiology | Admitting: Pharmacist

## 2023-05-04 VITALS — BP 119/70 | HR 80 | Wt 258.0 lb

## 2023-05-04 DIAGNOSIS — I5020 Unspecified systolic (congestive) heart failure: Secondary | ICD-10-CM | POA: Insufficient documentation

## 2023-05-04 MED ORDER — VALSARTAN 40 MG PO TABS: 40.0000 mg | ORAL_TABLET | Freq: Every day | ORAL | 11 refills | Status: DC

## 2023-05-04 NOTE — Assessment & Plan Note (Signed)
Assessment: Patient with newly discovered reduced ejection fraction 25% Only on 1 pilar thus far We discussed the importance of GDMT to help improve her ejection fraction and to help her stay out of the hospital and live longer She is concerned about cost of medications.  We did discuss that the only class of medication that does not have a cheaper alternative is SGLT2 inhibitors and that we could use a healthwell grant to pay for this We did discuss Entresto.  We discussed this diuretic properties and that she may be able come off of torsemide.  We would need to use the healthwell grant to pay this co-pay.  Patient wanted to look into this for next year.  She wanted to start on a cheaper alternative first. Due to her well-controlled blood pressures currently we will need to stop HCTZ in order to get more GDMT on board  Plan: Stop hydrochlorothiazide Start valsartan 40 mg twice daily Follow-up in 2 weeks.  Will need BMP at this visit I have asked her to continue to check her blood pressure and heart rate at home Next step, if labs are sufficient, would be to try to add spironolactone or attempt to titrate valsartan or carvedilol Eventually will want to add SGLT2 Since Dr. Gloriajean Dell has left,  patient would like to become a patient of Dr. Freda Jackson.  I will check with him as to when he needs to see her in the office as follow-up was unclear.

## 2023-05-04 NOTE — Patient Instructions (Addendum)
Please STOP taking hydrochlorothiazide START taking valsartan 40mg  twice a day Continue checking blood pressure at home

## 2023-05-04 NOTE — Progress Notes (Unsigned)
Patient ID: KYNDAL CORRA                 DOB: 02-Sep-1945                      MRN: [id number]     HPI: [name] is a 77 y.o. female referred by Dr. Eldridge Dace to pharmacy clinic for HF medication management. PMH is significant for pre-DM, melanoma, new dx of HFrEF. Most recent LVEF 25% on 03/10/23.  She presents today to PharmD clinic.  She reports chronic swelling after her fall last year when she broke her ankle.  She states she takes torsemide 10 mg daily for several days in a row but then will get some cystitis symptoms.  She we will stop the torsemide for a few days and take her Azo and then resume her torsemide.  She brings in her home blood pressure cuff and blood pressure readings.  Home blood pressure cuff found to be accurate in clinic today.  Home blood pressures range from 109-128/59-72.  Reports heart rates at home lower than 70s.  She does have a Medicare part D plan but is concerned about costs.  We did discuss the healthwell grant for the cardiomyopathy fund that could be used for both [place]  She does have questions about using ZzzQuil for sleep.  I advised her that this is just expensive diphenhydramine.  We generally do not like to use this in the older population due to drowsiness and increased risk of falls. She also asks about her rosuvastatin.  She has been very hesitant to start statins due to fear of it causing Alzheimer's.  She feels as though this is what happened and her mother.  We did discuss that there is no evidence that statins cause mental decline.  We did also talk about her coronary CTA.  She did have very minimal plaquing.  She does not actually have diabetes but she is prediabetic.  We did discuss that her age is a risk factor and that it would not be inappropriate to treat but that the ultimate decision is up to her.  I asked her to [place] know so that we can cancel her lipid panel in December if she is not taking.  Discussion with patient today  included the following: cardiac medication indications, introduction to GDMT clinic, reasoning behind medication titration, importance of medication adherence, and patient engagement. At last visit with MD carvedilol 6.25mg  was started. She requested to avoid expensive medications.   Home 129/81 hr 77 Home 119/75 HR 79 Office 118/70   Current CHF meds: carvedilol 6.25mg  twice a day, torsemide 10mg  daily,  Other HTN meds: hydrochlorothiazide 25mg  daily Previously tried:  Adherence Assessment  Do you ever forget to take your medication? [] Yes [] No  Do you ever skip doses due to side effects? [x] Yes [] No  Do you have trouble affording your medicines? [] Yes [x] No  Are you ever unable to pick up your medication due to transportation difficulties? [] Yes [x] No  Do you ever stop taking your medications because you don't believe they are helping? [] Yes [x] No  Do you check your weight daily? [x] Yes [] No    BP goal: <130/80  Family History:  Family History  Problem Relation Age of Onset   Hypertension Mother    Hyperlipidemia Mother    Alzheimer's disease Mother    Emphysema Father    COPD Father      Social History:  Social History  Socioeconomic History   Marital status: Divorced    Spouse name: Not on file   Number of children: 1   Years of education: Not on file   Highest education level: Some college, no degree  Occupational History    Comment: work from home  Tobacco Use   Smoking status: Never   Smokeless tobacco: Never  Vaping Use   Vaping status: Never Used  Substance and Sexual Activity   Alcohol use: No   Drug use: No   Sexual activity: Not Currently  Other Topics Concern   Not on file  Social History Narrative   Lives alone   Caffeine- coffee 2 c daily   Social Determinants of Health   Financial Resource Strain: Low Risk  (01/03/2018)   Overall Financial Resource Strain (CARDIA)    Difficulty of Paying Living Expenses: Not hard at all  Food  Insecurity: No Food Insecurity (09/14/2021)   Hunger Vital Sign    Worried About Running Out of Food in the Last Year: Never true    Ran Out of Food in the Last Year: Never true  Transportation Needs: No Transportation Needs (01/03/2018)   PRAPARE - Administrator, Civil Service (Medical): No    Lack of Transportation (Non-Medical): No  Physical Activity: Insufficiently Active (01/03/2018)   Exercise Vital Sign    Days of Exercise per Week: 3 days    Minutes of Exercise per Session: 10 min  Stress: Stress Concern Present (01/03/2018)   [employer] of Occupational Health - Occupational Stress Questionnaire    Feeling of Stress : To some extent  Social Connections: Moderately Isolated (01/03/2018)   Social Connection and Isolation Panel [NHANES]    Frequency of Communication with Friends and Family: Twice a week    Frequency of Social Gatherings with Friends and Family: Twice a week    Attends Religious Services: Never    Database administrator or Organizations: No    Attends Banker Meetings: Never    Marital Status: Divorced  Catering manager Violence: Not At Risk (01/03/2018)   Humiliation, Afraid, Rape, and Kick questionnaire    Fear of Current or Ex-Partner: No    Emotionally Abused: No    Physically Abused: No    Sexually Abused: No     Diet: Not discussed in detail today  Exercise: Very limited due to breaking her ankle.  Walks with a cane slowly  Home BP readings: 109-128/59-72.   Wt Readings from Last 3 Encounters:  05/04/23 258 lb (117 kg)  03/18/23 262 lb 12.8 oz (119.2 kg)  02/10/23 258 lb (117 kg)   BP Readings from Last 3 Encounters:  05/04/23 119/70  04/04/23 123/68  03/18/23 (!) 144/82   Pulse Readings from Last 3 Encounters:  05/04/23 80  04/04/23 83  03/18/23 90    Renal function: CrCl cannot be calculated (Patient's most recent lab result is older than the maximum 21 days allowed.).  Past Medical History:  Diagnosis  Date   Asymptomatic varicose veins    Cancer (HCC)    breast    Diverticulosis    DM type 2 (diabetes mellitus, type 2) (HCC) 10/23/2014   Dysuria    GERD (gastroesophageal reflux disease)    Hiatal hernia    Hyperlipidemia    Malignant neoplasm of breast (female), unspecified site    Migraine without aura, without mention of intractable migraine without mention of status migrainosus    Other abnormal blood chemistry  Pain in joint, pelvic region and thigh    Shingles    in eyes   Stricture and stenosis of esophagus    Trigger finger (acquired)    Unspecified cataract    Unspecified glaucoma(365.9)     Current Outpatient Medications on File Prior to Visit  Medication Sig Dispense Refill   Calcium-Phosphorus-Vitamin D (CITRACAL +D3 PO) Take 2 tablets by mouth daily with breakfast.     carvedilol (COREG) 6.25 MG tablet Take 1 tablet (6.25 mg total) by mouth 2 (two) times daily. 180 tablet 3   Cholecalciferol (VITAMIN D-3) 5000 units TABS Take 5,000 Units by mouth daily.     Cyanocobalamin 5000 MCG CAPS Take 5,000 mcg by mouth in the morning.     fluorometholone (FML) 0.1 % ophthalmic suspension Place 1 drop into the right eye in the morning.     HYDROcodone-acetaminophen (NORCO) 10-325 MG tablet Take 1 tablet by mouth every 6 (six) hours as needed for severe pain.     ibuprofen (ADVIL) 200 MG tablet Take 200 mg by mouth as needed for mild pain or headache.     omeprazole (PRILOSEC) 40 MG capsule Take 40 mg by mouth daily.     phenazopyridine (AZO-STANDARD) 95 MG tablet Take 1 tablet by mouth as needed.     polyethylene glycol (MIRALAX / GLYCOLAX) 17 g packet Take 17 g by mouth 2 (two) times daily. (Patient taking differently: Take 17 g by mouth as needed for moderate constipation.) 14 each 0   predniSONE (DELTASONE) 50 MG tablet Take one tablet by mouth 13 hours, 7 hours and one hour prior to CT Scan 3 tablet 0   rosuvastatin (CRESTOR) 10 MG tablet Take 1 tablet (10 mg total) by  mouth daily. (Patient not taking: Reported on 05/04/2023) 90 tablet 3   timolol (TIMOPTIC) 0.5 % ophthalmic solution Place 1 drop into the right eye in the morning and at bedtime.     torsemide (DEMADEX) 10 MG tablet Take 10 mg by mouth daily.     valACYclovir (VALTREX) 1000 MG tablet Take 1,000 mg by mouth every other day.     VOLTAREN 1 % GEL Apply 2-4 g topically 4 (four) times daily as needed (for pain (hands, neck, shoulders, etc..)).     No current facility-administered medications on file prior to visit.    Allergies  Allergen Reactions   Adhesive [Tape] Dermatitis and Other (See Comments)    Makes the skin "blood red"   Cymbalta [Duloxetine Hcl] Other (See Comments)    sleepiness   Gabapentin Other (See Comments)    Sleepiness    Betimol [Timolol Maleate] Other (See Comments)    Allergic to preservatives - redness and affects eye pressure   Cosopt [Dorzolamide Hcl-Timolol Mal] Other (See Comments)    Turns eyes red, increases eye pressure   Penicillin G Other (See Comments)    Reaction not recalled   Penicillins Other (See Comments)    Reaction not recalled   Prednisolone Acetate Other (See Comments)    Caused increased eye pressure   Tamoxifen Other (See Comments)    Vaginal bleeding   Thimerosal (Thiomersal) Other (See Comments)    Makes pt eyes red    Iodine-131 Rash and Other (See Comments)    Pt does need pre-meds for CT contrast per [facility] Radiology 10/20/2021 Dye from PET scan only, states has had since and did ok   Latex Rash     Assessment/Plan:  1. CHF -  HFrEF (heart failure  with reduced ejection fraction) (HCC) Assessment: Patient with newly discovered reduced ejection fraction 25% Only on 1 pilar thus far We discussed the importance of GDMT to help improve her ejection fraction and to help her stay out of the hospital and live longer She is concerned about cost of medications.  We did discuss that the only class of medication that does not have a  cheaper alternative is SGLT2 inhibitors and that we could use a healthwell grant to pay for this We did discuss Entresto.  We discussed this diuretic properties and that she may be able come off of torsemide.  We would need to use the healthwell grant to pay this co-pay.  Patient wanted to look into this for next year.  She wanted to start on a cheaper alternative first. Due to her well-controlled blood pressures currently we will need to stop HCTZ in order to get more GDMT on board  Plan: Stop hydrochlorothiazide Start valsartan 40 mg twice daily Follow-up in 2 weeks.  Will need BMP at this visit I have asked her to continue to check her blood pressure and heart rate at home Next step, if labs are sufficient, would be to try to add spironolactone or attempt to titrate valsartan or carvedilol Eventually will want to add SGLT2 Since Dr. Gloriajean Dell has left,  patient would like to become a patient of Dr. Freda Jackson.  I will check with him as to when he needs to see her in the office as follow-up was unclear.  Thank you   [name], Pharm.D, BCACP, BCPS, CPP [facility] [address], [place], [place] [address]  Phone: [phone]; Fax: [phone]

## 2023-05-05 ENCOUNTER — Telehealth: Payer: Self-pay | Admitting: Pharmacist

## 2023-05-05 NOTE — Telephone Encounter (Signed)
Pt c/o medication issue:  1. Name of Medication: valsartan (DIOVAN) 40 MG tablet   2. How are you currently taking this medication (dosage and times per day)?   3. Are you having a reaction (difficulty breathing--STAT)?   4. What is your medication issue? Patient is requesting call back to get clarification on this medication and how often to take. In office she states she was told to take 2x daily, but the instructions state 1x daily. Please advise.

## 2023-05-05 NOTE — Telephone Encounter (Signed)
Spoke with patient and she states in her visit yesterday. She was told to start valsartan and to take 40 mg BID.    In OV note it states take valsartan 40 mg BID.  Medication instructions states valsartan 40 mg daily. Can we please clarify?

## 2023-05-06 ENCOUNTER — Telehealth: Payer: Self-pay | Admitting: Interventional Cardiology

## 2023-05-06 MED ORDER — VALSARTAN 40 MG PO TABS: 40.0000 mg | ORAL_TABLET | Freq: Two times a day (BID) | ORAL | 11 refills | Status: AC

## 2023-05-06 NOTE — Telephone Encounter (Signed)
Called patient, she took so far only 2 doses of valsartan 40 mg ( last night and this morning) her BP always been soft so she is wondering how low is too low. Reported BP 101/60 114/55 while siting upright and 92/57 she was in recliner. Denies dizziness, headache. Feeling tired today but she did not sleep well last night so she thinks tiredness is from insomnia.  Advised her to continue with prescribed dose of valsartan and keep eye on BP if it goes below 100 for SBP and below 60 for DBP cut valsartan dose in half (from 40 mg twice daily to 20 mg twice daily). Confirmed she has stopped taking hydrochlorothiazide.

## 2023-05-06 NOTE — Telephone Encounter (Signed)
That was my mistake prescription should be for twice daily.  I called the patient and clarified that she should be taking valsartan 40 mg twice a day.  I have sent in an updated prescription to her pharmacy to reflect the correct directions.

## 2023-05-06 NOTE — Telephone Encounter (Signed)
Pt c/o BP issue: STAT if pt c/o blurred vision, one-sided weakness or slurred speech  1. What are your last 5 BP readings? 101/60;  114/55; 92/57  2. Are you having any other symptoms (ex. Dizziness, headache, blurred vision, passed out)? tiredness  3. What is your BP issue? Is it too low

## 2023-05-12 ENCOUNTER — Encounter: Payer: Self-pay | Admitting: Nurse Practitioner

## 2023-05-12 ENCOUNTER — Telehealth: Payer: Self-pay | Admitting: Interventional Cardiology

## 2023-05-12 DIAGNOSIS — I5020 Unspecified systolic (congestive) heart failure: Secondary | ICD-10-CM

## 2023-05-12 MED ORDER — TORSEMIDE 10 MG PO TABS: 20.0000 mg | ORAL_TABLET | Freq: Every day | ORAL | 11 refills | Status: DC

## 2023-05-12 NOTE — Telephone Encounter (Signed)
Error

## 2023-05-12 NOTE — Telephone Encounter (Signed)
Spoke with patient. I have asked her to increase her toresmide to 20mg  daily and wear her compression stocking. I asked her to come in to the office Monday for some lab work. Next step would be either change to entresto- she is still thinking about this Or add spironolactone (needs to be done eventually anyway)

## 2023-05-12 NOTE — Telephone Encounter (Signed)
Called pt in regards to weight gain.  Reports since OV with pharmacist when hydrochlorothiazide was stopped and valsartan added notes increased swelling.   Felt that med change was okay at first but began to notice feet and legs starting to swell again. Feet to calf feel tight hurts to walk at times. Has a HA at times.   Reports ate [place] meat yesterday for dinner.  Other than this no other high salt foods.  BP's given were: 140/80, 13/76, 133/71. Elevates legs some but has a lot to do and is not always able to elevate.  Advised to do work in chunks if able to have time to elevate legs throughout the day.  Notes has not urinated much except today reports urinated about 2 lbs off.  Was 264 lbs this morning after urination down to 262 lbs.  Advised pt will send message to pharmacist to review.

## 2023-05-12 NOTE — Telephone Encounter (Signed)
Pt c/o swelling: STAT is pt has developed SOB within 24 hours  How much weight have you gained and in what time span?  Patient states she gained about 5 lbs in a few days   If swelling, where is the swelling located?  Legs and feet  Are you currently taking a fluid pill?  Patient states she was taken off hydrochlorothiazide   Are you currently SOB?  No   Do you have a log of your daily weights (if so, list)?  10/04: 258.6 10/07: 258 10/09 258.2 10/16: 264 10/17: 264  Have you gained 3 pounds in a day or 5 pounds in a week?   Have you traveled recently?  No

## 2023-05-12 NOTE — Addendum Note (Signed)
Addended by: Malena Peer D on: 05/12/2023 05:03 PM   Modules accepted: Orders

## 2023-05-13 ENCOUNTER — Telehealth: Payer: Self-pay | Admitting: Interventional Cardiology

## 2023-05-13 NOTE — Telephone Encounter (Signed)
Pt c/o medication issue:  1. Name of Medication: torsemide (DEMADEX) 10 MG tablet   2. How are you currently taking this medication (dosage and times per day)?  Take 2 tablets (20 mg total) by mouth daily.  3. Are you having a reaction (difficulty breathing--STAT)? No  4. What is your medication issue? Pt would like a callback from Pharmacist regarding medication. Pt states that she does not think that she can continue to take medication

## 2023-05-13 NOTE — Telephone Encounter (Signed)
I called and spoke with patient.  She states last night she took an extra 10 mg of torsemide.  She said she peed a lot and then got to the point where she felt like she needed to pee a lot of pressure but could not pee.  She says that this is not new that generally she will take torsemide for several days and then gets to the point where she feels like she cannot pee and she will hold it for a couple days until she feels better.  Also will take Azo.  States that she has peed some today has not taken any torsemide today. Patient has not found her compression stockings.  She denies any symptoms of shortness of breath she can lie down in bed without any shortness of breath.  She sleeps both in a bed in a recliner.  I have asked her to find her compression stockings and get them on.  Hold off on torsemide.  She will come in Monday for labs, if they look good we might try adding spironolactone. Advised to wear compression stockings and to elevate feet above heart level while sitting or while sleeping.

## 2023-05-17 LAB — BASIC METABOLIC PANEL
BUN/Creatinine Ratio: 20 (ref 12–28)
BUN: 16 mg/dL (ref 8–27)
CO2: 23 mmol/L (ref 20–29)
Calcium: 9.7 mg/dL (ref 8.7–10.3)
Chloride: 98 mmol/L (ref 96–106)
Creatinine, Ser: 0.81 mg/dL (ref 0.57–1.00)
Glucose: 103 mg/dL — ABNORMAL HIGH (ref 70–99)
Potassium: 4.9 mmol/L (ref 3.5–5.2)
Sodium: 134 mmol/L (ref 134–144)
eGFR: 75 mL/min/{1.73_m2} (ref >59)

## 2023-05-18 ENCOUNTER — Telehealth: Payer: Self-pay | Admitting: Pharmacist

## 2023-05-18 MED ORDER — SPIRONOLACTONE 25 MG PO TABS: 12.5000 mg | ORAL_TABLET | Freq: Every day | ORAL | 3 refills | Status: DC

## 2023-05-18 MED ORDER — FUROSEMIDE 20 MG PO TABS: 20.0000 mg | ORAL_TABLET | Freq: Every day | ORAL | 3 refills | Status: DC | PRN

## 2023-05-18 NOTE — Telephone Encounter (Signed)
BMP stable after adding valsartan. She took one dose of torsemide Monday to help with the swelling, said she did ok with it but the next AM felt a "twinge" and didn't take another. She is wearing compression stockings but says it only helps a little.  Will start spironolactone 12.5mg  daily and try changing torsemide 10mg  to furosemide 20mg  daily as needed. She already has f/u in clinic with me tomorrow.  Will watch K closely, but resuming a loop diuretic should help.

## 2023-05-19 ENCOUNTER — Ambulatory Visit: Payer: Medicare Other

## 2023-05-19 NOTE — Progress Notes (Deleted)
Patient ID: [name]                 DOB: 1945/10/01                      MRN: [id number]     HPI: MAURENE [name] is a 77 y.o. female referred by Dr. Eldridge Dace to pharmacy clinic for HF medication management. PMH is significant for pre-DM, melanoma, new dx of HFrEF. Most recent LVEF 25% on 03/10/23.  Patient seen in clinic 10/9. She reported chronic swelling after her fall last year when she broke her ankle.  She was taking torsemide 10 mg daily for several days in a row but then would get some cystitis symptoms.  She would stop the torsemide for a few days and take her Azo and then resume her torsemide.  Home blood pressure cuff found to be accurate.  She was concerned about cost of medications, even though we did discuss the healthwell grant.  She wanted to try one of the older medications first therefore her HCTZ was stopped and she was started on valsartan 40 mg twice daily.    She contacted the clinic 10/17 with concerns about swelling after stopping her hydrochlorothiazide.  I instructed her to increase her torsemide from 10 mg to 20 mg however she called the next day stating that after taking the higher dose she peed excessively and then got to the point where she felt like she needed to pee but could not with having a lot of pressure like when she has cystitis.  Did not want to take the torsemide anymore.  I asked her to find her compression stockings and to hold the torsemide through the weekend.  She came in for lab work on Monday, kidney function stable potassium 4.9.  She had taken 1 dose of torsemide on Monday to help with the swelling.  Reports compression stockings only helping minimally.  Yesterday, she was started on spironolactone 12.5 mg daily and her torsemide was changed to furosemide 20 mg daily as needed.  Home bp HR? Will need lab work next week   Home blood pressures range from 109-128/59-72.  Reports heart rates at home lower than 70s.  She does have a Medicare part D plan  but is concerned about costs.  We did discuss the healthwell grant for the cardiomyopathy fund that could be used for both [place]  She does have questions about using ZzzQuil for sleep.  I advised her that this is just expensive diphenhydramine.  We generally do not like to use this in the older population due to drowsiness and increased risk of falls. She also asks about her rosuvastatin.  She has been very hesitant to start statins due to fear of it causing Alzheimer's.  She feels as though this is what happened and her mother.  We did discuss that there is no evidence that statins cause mental decline.  We did also talk about her coronary CTA.  She did have very minimal plaquing.  She does not actually have diabetes but she is prediabetic.  We did discuss that her age is a risk factor and that it would not be inappropriate to treat but that the ultimate decision is up to her.  I asked her to [place] know so that we can cancel her lipid panel in December if she is not taking.  Discussion with patient today included the following: cardiac medication indications, introduction to GDMT clinic, reasoning  behind medication titration, importance of medication adherence, and patient engagement. At last visit with MD carvedilol 6.25mg  was started. She requested to avoid expensive medications.   Home 129/81 hr 77 Home 119/75 HR 79 Office 118/70   Current CHF meds: carvedilol 6.25mg  twice a day, valsartan 40 mg twice a day, furosemide 20 mg daily as needed, spironolactone 12.5 mg daily Other HTN meds: hydrochlorothiazide 25mg  daily Previously tried:  Adherence Assessment  Do you ever forget to take your medication? [] Yes [] No  Do you ever skip doses due to side effects? [x] Yes [] No  Do you have trouble affording your medicines? [] Yes [x] No  Are you ever unable to pick up your medication due to transportation difficulties? [] Yes [x] No  Do you ever stop taking your medications because you  don't believe they are helping? [] Yes [x] No  Do you check your weight daily? [x] Yes [] No    BP goal: <130/80  Family History:  Family History  Problem Relation Age of Onset   Hypertension Mother    Hyperlipidemia Mother    Alzheimer's disease Mother    Emphysema Father    COPD Father      Social History:  Social History   Socioeconomic History   Marital status: Divorced    Spouse name: Not on file   Number of children: 1   Years of education: Not on file   Highest education level: Some college, no degree  Occupational History    Comment: work from home  Tobacco Use   Smoking status: Never   Smokeless tobacco: Never  Vaping Use   Vaping status: Never Used  Substance and Sexual Activity   Alcohol use: No   Drug use: No   Sexual activity: Not Currently  Other Topics Concern   Not on file  Social History Narrative   Lives alone   Caffeine- coffee 2 c daily   Social Determinants of Health   Financial Resource Strain: Low Risk  (01/03/2018)   Overall Financial Resource Strain (CARDIA)    Difficulty of Paying Living Expenses: Not hard at all  Food Insecurity: No Food Insecurity (09/14/2021)   Hunger Vital Sign    Worried About Running Out of Food in the Last Year: Never true    Ran Out of Food in the Last Year: Never true  Transportation Needs: No Transportation Needs (01/03/2018)   PRAPARE - Administrator, Civil Service (Medical): No    Lack of Transportation (Non-Medical): No  Physical Activity: Insufficiently Active (01/03/2018)   Exercise Vital Sign    Days of Exercise per Week: 3 days    Minutes of Exercise per Session: 10 min  Stress: Stress Concern Present (01/03/2018)   [employer] of Occupational Health - Occupational Stress Questionnaire    Feeling of Stress : To some extent  Social Connections: Moderately Isolated (01/03/2018)   Social Connection and Isolation Panel [NHANES]    Frequency of Communication with Friends and Family: Twice  a week    Frequency of Social Gatherings with Friends and Family: Twice a week    Attends Religious Services: Never    Database administrator or Organizations: No    Attends Banker Meetings: Never    Marital Status: Divorced  Catering manager Violence: Not At Risk (01/03/2018)   Humiliation, Afraid, Rape, and Kick questionnaire    Fear of Current or Ex-Partner: No    Emotionally Abused: No    Physically Abused: No    Sexually Abused: No  Diet: Not discussed in detail today  Exercise: Very limited due to breaking her ankle.  Walks with a cane slowly  Home BP readings: 109-128/59-72.   Wt Readings from Last 3 Encounters:  05/04/23 258 lb (117 kg)  03/18/23 262 lb 12.8 oz (119.2 kg)  02/10/23 258 lb (117 kg)   BP Readings from Last 3 Encounters:  05/04/23 119/70  04/04/23 123/68  03/18/23 (!) 144/82   Pulse Readings from Last 3 Encounters:  05/04/23 80  04/04/23 83  03/18/23 90    Renal function: CrCl cannot be calculated (Unknown ideal weight.).  Past Medical History:  Diagnosis Date   Asymptomatic varicose veins    Cancer (HCC)    breast    Diverticulosis    DM type 2 (diabetes mellitus, type 2) (HCC) 10/23/2014   Dysuria    GERD (gastroesophageal reflux disease)    Hiatal hernia    Hyperlipidemia    Malignant neoplasm of breast (female), unspecified site    Migraine without aura, without mention of intractable migraine without mention of status migrainosus    Other abnormal blood chemistry    Pain in joint, pelvic region and thigh    Shingles    in eyes   Stricture and stenosis of esophagus    Trigger finger (acquired)    Unspecified cataract    Unspecified glaucoma(365.9)     Current Outpatient Medications on File Prior to Visit  Medication Sig Dispense Refill   Calcium-Phosphorus-Vitamin D (CITRACAL +D3 PO) Take 2 tablets by mouth daily with breakfast.     carvedilol (COREG) 6.25 MG tablet Take 1 tablet (6.25 mg total) by mouth 2  (two) times daily. 180 tablet 3   Cholecalciferol (VITAMIN D-3) 5000 units TABS Take 5,000 Units by mouth daily.     Cyanocobalamin 5000 MCG CAPS Take 5,000 mcg by mouth in the morning.     fluorometholone (FML) 0.1 % ophthalmic suspension Place 1 drop into the right eye in the morning.     furosemide (LASIX) 20 MG tablet Take 1 tablet (20 mg total) by mouth daily as needed. 90 tablet 3   HYDROcodone-acetaminophen (NORCO) 10-325 MG tablet Take 1 tablet by mouth every 6 (six) hours as needed for severe pain.     ibuprofen (ADVIL) 200 MG tablet Take 200 mg by mouth as needed for mild pain or headache.     omeprazole (PRILOSEC) 40 MG capsule Take 40 mg by mouth daily.     phenazopyridine (AZO-STANDARD) 95 MG tablet Take 1 tablet by mouth as needed.     polyethylene glycol (MIRALAX / GLYCOLAX) 17 g packet Take 17 g by mouth 2 (two) times daily. (Patient taking differently: Take 17 g by mouth as needed for moderate constipation.) 14 each 0   predniSONE (DELTASONE) 50 MG tablet Take one tablet by mouth 13 hours, 7 hours and one hour prior to CT Scan 3 tablet 0   rosuvastatin (CRESTOR) 10 MG tablet Take 1 tablet (10 mg total) by mouth daily. (Patient not taking: Reported on 05/04/2023) 90 tablet 3   spironolactone (ALDACTONE) 25 MG tablet Take 0.5 tablets (12.5 mg total) by mouth daily. 90 tablet 3   timolol (TIMOPTIC) 0.5 % ophthalmic solution Place 1 drop into the right eye in the morning and at bedtime.     valACYclovir (VALTREX) 1000 MG tablet Take 1,000 mg by mouth every other day.     valsartan (DIOVAN) 40 MG tablet Take 1 tablet (40 mg total) by mouth 2 (two) times  daily. 60 tablet 11   VOLTAREN 1 % GEL Apply 2-4 g topically 4 (four) times daily as needed (for pain (hands, neck, shoulders, etc..)).     No current facility-administered medications on file prior to visit.    Allergies  Allergen Reactions   Adhesive [Tape] Dermatitis and Other (See Comments)    Makes the skin "blood red"    Cymbalta [Duloxetine Hcl] Other (See Comments)    sleepiness   Gabapentin Other (See Comments)    Sleepiness    Betimol [Timolol Maleate] Other (See Comments)    Allergic to preservatives - redness and affects eye pressure   Cosopt [Dorzolamide Hcl-Timolol Mal] Other (See Comments)    Turns eyes red, increases eye pressure   Penicillin G Other (See Comments)    Reaction not recalled   Penicillins Other (See Comments)    Reaction not recalled   Prednisolone Acetate Other (See Comments)    Caused increased eye pressure   Tamoxifen Other (See Comments)    Vaginal bleeding   Thimerosal (Thiomersal) Other (See Comments)    Makes pt eyes red    Iodine-131 Rash and Other (See Comments)    Pt does need pre-meds for CT contrast per [facility] Radiology 10/20/2021 Dye from PET scan only, states has had since and did ok   Latex Rash     Assessment/Plan:  1. CHF -  No problem-specific Assessment & Plan notes found for this encounter.   Thank you   Olene Floss, Pharm.D, BCACP, BCPS, CPP [facility] [address] [place], [place] [address]  Phone: [phone]; Fax: [phone]

## 2023-05-21 NOTE — Progress Notes (Unsigned)
Patient ID: [name]                 DOB: 1945/10/01                      MRN: [id number]     HPI: MAURENE [name] is a 77 y.o. female referred by Dr. Eldridge Dace to pharmacy clinic for HF medication management. PMH is significant for pre-DM, melanoma, new dx of HFrEF. Most recent LVEF 25% on 03/10/23.  Patient seen in clinic 10/9. She reported chronic swelling after her fall last year when she broke her ankle.  She was taking torsemide 10 mg daily for several days in a row but then would get some cystitis symptoms.  She would stop the torsemide for a few days and take her Azo and then resume her torsemide.  Home blood pressure cuff found to be accurate.  She was concerned about cost of medications, even though we did discuss the healthwell grant.  She wanted to try one of the older medications first therefore her HCTZ was stopped and she was started on valsartan 40 mg twice daily.    She contacted the clinic 10/17 with concerns about swelling after stopping her hydrochlorothiazide.  I instructed her to increase her torsemide from 10 mg to 20 mg however she called the next day stating that after taking the higher dose she peed excessively and then got to the point where she felt like she needed to pee but could not with having a lot of pressure like when she has cystitis.  Did not want to take the torsemide anymore.  I asked her to find her compression stockings and to hold the torsemide through the weekend.  She came in for lab work on Monday, kidney function stable potassium 4.9.  She had taken 1 dose of torsemide on Monday to help with the swelling.  Reports compression stockings only helping minimally.  Yesterday, she was started on spironolactone 12.5 mg daily and her torsemide was changed to furosemide 20 mg daily as needed.  Home bp HR? Will need lab work next week   Home blood pressures range from 109-128/59-72.  Reports heart rates at home lower than 70s.  She does have a Medicare part D plan  but is concerned about costs.  We did discuss the healthwell grant for the cardiomyopathy fund that could be used for both [place]  She does have questions about using ZzzQuil for sleep.  I advised her that this is just expensive diphenhydramine.  We generally do not like to use this in the older population due to drowsiness and increased risk of falls. She also asks about her rosuvastatin.  She has been very hesitant to start statins due to fear of it causing Alzheimer's.  She feels as though this is what happened and her mother.  We did discuss that there is no evidence that statins cause mental decline.  We did also talk about her coronary CTA.  She did have very minimal plaquing.  She does not actually have diabetes but she is prediabetic.  We did discuss that her age is a risk factor and that it would not be inappropriate to treat but that the ultimate decision is up to her.  I asked her to [place] know so that we can cancel her lipid panel in December if she is not taking.  Discussion with patient today included the following: cardiac medication indications, introduction to GDMT clinic, reasoning  behind medication titration, importance of medication adherence, and patient engagement. At last visit with MD carvedilol 6.25mg  was started. She requested to avoid expensive medications.   Home 129/81 hr 77 Home 119/75 HR 79 Office 118/70   Current CHF meds: carvedilol 6.25mg  twice a day, valsartan 40 mg twice a day, furosemide 20 mg daily as needed, spironolactone 12.5 mg daily Other HTN meds: hydrochlorothiazide 25mg  daily Previously tried:  Adherence Assessment  Do you ever forget to take your medication? [] Yes [] No  Do you ever skip doses due to side effects? [x] Yes [] No  Do you have trouble affording your medicines? [] Yes [x] No  Are you ever unable to pick up your medication due to transportation difficulties? [] Yes [x] No  Do you ever stop taking your medications because you  don't believe they are helping? [] Yes [x] No  Do you check your weight daily? [x] Yes [] No    BP goal: <130/80  Family History:  Family History  Problem Relation Age of Onset   Hypertension Mother    Hyperlipidemia Mother    Alzheimer's disease Mother    Emphysema Father    COPD Father      Social History:  Social History   Socioeconomic History   Marital status: Divorced    Spouse name: Not on file   Number of children: 1   Years of education: Not on file   Highest education level: Some college, no degree  Occupational History    Comment: work from home  Tobacco Use   Smoking status: Never   Smokeless tobacco: Never  Vaping Use   Vaping status: Never Used  Substance and Sexual Activity   Alcohol use: No   Drug use: No   Sexual activity: Not Currently  Other Topics Concern   Not on file  Social History Narrative   Lives alone   Caffeine- coffee 2 c daily   Social Determinants of Health   Financial Resource Strain: Low Risk  (01/03/2018)   Overall Financial Resource Strain (CARDIA)    Difficulty of Paying Living Expenses: Not hard at all  Food Insecurity: No Food Insecurity (09/14/2021)   Hunger Vital Sign    Worried About Running Out of Food in the Last Year: Never true    Ran Out of Food in the Last Year: Never true  Transportation Needs: No Transportation Needs (01/03/2018)   PRAPARE - Administrator, Civil Service (Medical): No    Lack of Transportation (Non-Medical): No  Physical Activity: Insufficiently Active (01/03/2018)   Exercise Vital Sign    Days of Exercise per Week: 3 days    Minutes of Exercise per Session: 10 min  Stress: Stress Concern Present (01/03/2018)   [employer] of Occupational Health - Occupational Stress Questionnaire    Feeling of Stress : To some extent  Social Connections: Moderately Isolated (01/03/2018)   Social Connection and Isolation Panel [NHANES]    Frequency of Communication with Friends and Family: Twice  a week    Frequency of Social Gatherings with Friends and Family: Twice a week    Attends Religious Services: Never    Database administrator or Organizations: No    Attends Banker Meetings: Never    Marital Status: Divorced  Catering manager Violence: Not At Risk (01/03/2018)   Humiliation, Afraid, Rape, and Kick questionnaire    Fear of Current or Ex-Partner: No    Emotionally Abused: No    Physically Abused: No    Sexually Abused: No  Diet: Not discussed in detail today  Exercise: Very limited due to breaking her ankle.  Walks with a cane slowly  Home BP readings: 109-128/59-72.   Wt Readings from Last 3 Encounters:  05/04/23 258 lb (117 kg)  03/18/23 262 lb 12.8 oz (119.2 kg)  02/10/23 258 lb (117 kg)   BP Readings from Last 3 Encounters:  05/04/23 119/70  04/04/23 123/68  03/18/23 (!) 144/82   Pulse Readings from Last 3 Encounters:  05/04/23 80  04/04/23 83  03/18/23 90    Renal function: CrCl cannot be calculated (Unknown ideal weight.).  Past Medical History:  Diagnosis Date   Asymptomatic varicose veins    Cancer (HCC)    breast    Diverticulosis    DM type 2 (diabetes mellitus, type 2) (HCC) 10/23/2014   Dysuria    GERD (gastroesophageal reflux disease)    Hiatal hernia    Hyperlipidemia    Malignant neoplasm of breast (female), unspecified site    Migraine without aura, without mention of intractable migraine without mention of status migrainosus    Other abnormal blood chemistry    Pain in joint, pelvic region and thigh    Shingles    in eyes   Stricture and stenosis of esophagus    Trigger finger (acquired)    Unspecified cataract    Unspecified glaucoma(365.9)     Current Outpatient Medications on File Prior to Visit  Medication Sig Dispense Refill   Calcium-Phosphorus-Vitamin D (CITRACAL +D3 PO) Take 2 tablets by mouth daily with breakfast.     carvedilol (COREG) 6.25 MG tablet Take 1 tablet (6.25 mg total) by mouth 2  (two) times daily. 180 tablet 3   Cholecalciferol (VITAMIN D-3) 5000 units TABS Take 5,000 Units by mouth daily.     Cyanocobalamin 5000 MCG CAPS Take 5,000 mcg by mouth in the morning.     fluorometholone (FML) 0.1 % ophthalmic suspension Place 1 drop into the right eye in the morning.     furosemide (LASIX) 20 MG tablet Take 1 tablet (20 mg total) by mouth daily as needed. 90 tablet 3   HYDROcodone-acetaminophen (NORCO) 10-325 MG tablet Take 1 tablet by mouth every 6 (six) hours as needed for severe pain.     ibuprofen (ADVIL) 200 MG tablet Take 200 mg by mouth as needed for mild pain or headache.     omeprazole (PRILOSEC) 40 MG capsule Take 40 mg by mouth daily.     phenazopyridine (AZO-STANDARD) 95 MG tablet Take 1 tablet by mouth as needed.     polyethylene glycol (MIRALAX / GLYCOLAX) 17 g packet Take 17 g by mouth 2 (two) times daily. (Patient taking differently: Take 17 g by mouth as needed for moderate constipation.) 14 each 0   predniSONE (DELTASONE) 50 MG tablet Take one tablet by mouth 13 hours, 7 hours and one hour prior to CT Scan 3 tablet 0   rosuvastatin (CRESTOR) 10 MG tablet Take 1 tablet (10 mg total) by mouth daily. (Patient not taking: Reported on 05/04/2023) 90 tablet 3   spironolactone (ALDACTONE) 25 MG tablet Take 0.5 tablets (12.5 mg total) by mouth daily. 90 tablet 3   timolol (TIMOPTIC) 0.5 % ophthalmic solution Place 1 drop into the right eye in the morning and at bedtime.     valACYclovir (VALTREX) 1000 MG tablet Take 1,000 mg by mouth every other day.     valsartan (DIOVAN) 40 MG tablet Take 1 tablet (40 mg total) by mouth 2 (two) times  daily. 60 tablet 11   VOLTAREN 1 % GEL Apply 2-4 g topically 4 (four) times daily as needed (for pain (hands, neck, shoulders, etc..)).     No current facility-administered medications on file prior to visit.    Allergies  Allergen Reactions   Adhesive [Tape] Dermatitis and Other (See Comments)    Makes the skin "blood red"    Cymbalta [Duloxetine Hcl] Other (See Comments)    sleepiness   Gabapentin Other (See Comments)    Sleepiness    Betimol [Timolol Maleate] Other (See Comments)    Allergic to preservatives - redness and affects eye pressure   Cosopt [Dorzolamide Hcl-Timolol Mal] Other (See Comments)    Turns eyes red, increases eye pressure   Penicillin G Other (See Comments)    Reaction not recalled   Penicillins Other (See Comments)    Reaction not recalled   Prednisolone Acetate Other (See Comments)    Caused increased eye pressure   Tamoxifen Other (See Comments)    Vaginal bleeding   Thimerosal (Thiomersal) Other (See Comments)    Makes pt eyes red    Iodine-131 Rash and Other (See Comments)    Pt does need pre-meds for CT contrast per [facility] Radiology 10/20/2021 Dye from PET scan only, states has had since and did ok   Latex Rash     Assessment/Plan:  1. CHF -  No problem-specific Assessment & Plan notes found for this encounter.   Thank you   Olene Floss, Pharm.D, BCACP, BCPS, CPP [facility] Division of [facility] [address] [place], [place] [address]  Phone: [phone]; Fax: [phone]

## 2023-05-23 ENCOUNTER — Ambulatory Visit: Payer: Medicare Other | Attending: Cardiology | Admitting: Pharmacist

## 2023-05-23 VITALS — BP 120/70 | HR 69

## 2023-05-23 DIAGNOSIS — I5020 Unspecified systolic (congestive) heart failure: Secondary | ICD-10-CM | POA: Diagnosis present

## 2023-05-23 MED ORDER — FUROSEMIDE 20 MG PO TABS: 20.0000 mg | ORAL_TABLET | Freq: Two times a day (BID) | ORAL | 3 refills | Status: DC | PRN

## 2023-05-23 MED ORDER — EMPAGLIFLOZIN 10 MG PO TABS: 10.0000 mg | ORAL_TABLET | Freq: Every day | ORAL | 3 refills | Status: DC

## 2023-05-23 NOTE — Patient Instructions (Addendum)
Please start taking Jardiance 10mg  daily Continue carvedilol 6.25mg  twice a day, valsartan 40 mg twice a day, spironolactone 12.5 mg daily You may take furosemide twice a day as needed for swelling Continue checking blood pressure at home

## 2023-05-23 NOTE — Assessment & Plan Note (Signed)
Assessment: Patient has swelling on exam, denies any pain Denies any shortness of breath Chronic swelling in left leg Blood pressure well-controlled, a few lower readings Appears she gets initial response to furosemide but then swelling increases throughout the day Discussed SGLT2 inhibitors, decrease in CV mortality and heart failure hospitalization, potential benefit in diuresis and healthwell grant Patient is agreeable to starting SGLT2.  I was able to get her healthwell grant and gave her the information to give to [facility] We also did discuss the possibility of switching her valsartan to Entresto at some point and also using this grant  Plan: Start Jardiance 10 mg daily Continue carvedilol 6.25mg  twice a day, valsartan 40 mg twice a day, spironolactone 12.5 mg daily Check BMP today since starting spironolactone Patient may use furosemide twice a day if needed Follow-up in 2 weeks

## 2023-05-24 LAB — BASIC METABOLIC PANEL
BUN/Creatinine Ratio: 23 (ref 12–28)
BUN: 23 mg/dL (ref 8–27)
CO2: 25 mmol/L (ref 20–29)
Calcium: 9.9 mg/dL (ref 8.7–10.3)
Chloride: 97 mmol/L (ref 96–106)
Creatinine, Ser: 0.99 mg/dL (ref 0.57–1.00)
Glucose: 112 mg/dL — ABNORMAL HIGH (ref 70–99)
Potassium: 5 mmol/L (ref 3.5–5.2)
Sodium: 136 mmol/L (ref 134–144)
eGFR: 59 mL/min/{1.73_m2} — ABNORMAL LOW (ref >59)

## 2023-06-09 ENCOUNTER — Ambulatory Visit: Payer: Medicare Other

## 2023-06-09 DIAGNOSIS — E785 Hyperlipidemia, unspecified: Secondary | ICD-10-CM

## 2023-06-09 NOTE — Progress Notes (Deleted)
Patient ID: [name]                 DOB: 12-27-1945                      MRN: [id number]     HPI: [name] is a 77 y.o. female referred by Dr. Eldridge Dace to pharmacy clinic for HF medication management. PMH is significant for pre-DM, melanoma, new dx of HFrEF. Most recent LVEF 25% on 03/10/23. Pt has coronary calcium CTA on 04/14/23 - score 85.9 (51st percentile), minimal non-obstructive CAD (0-24%).   Patient seen in clinic 10/9. She reported chronic swelling after her fall last year when she broke her ankle.  She was taking torsemide 10 mg daily for several days in a row but then would get some cystitis symptoms.  She would stop the torsemide for a few days and take her Azo and then resume her torsemide.  Home blood pressure cuff found to be accurate.  She was concerned about cost of medications, even though we did discuss the healthwell grant.  She wanted to try one of the older medications first therefore her HCTZ was stopped and she was started on valsartan 40 mg twice daily.    She contacted the clinic 10/17 with concerns about swelling after stopping her hydrochlorothiazide. I instructed her to increase her torsemide from 10 mg to 20 mg however she called the next day stating that after taking the higher dose she peed excessively and then got to the point where she felt like she needed to pee but could not with having a lot of pressure like when she has cystitis.  Did not want to take the torsemide anymore.  I asked her to find her compression stockings and to hold the torsemide through the weekend.  She came in for lab work on Monday, kidney function stable potassium 4.9.  She had taken 1 dose of torsemide on Monday to help with the swelling.  Reports compression stockings only helping minimally.  She was started on spironolactone 12.5 mg daily and her torsemide was changed to furosemide 20 mg daily as needed. On 10/28, pt reported some response to the furosemide. Issues with urination  resolved. LE present on exam (chronic swelling in L leg). SGLT2i (Jardiance 10) was initiated, pt approved for [employer]. Discussed possibility of switching valsartan to Entresto at some point using the grant. BMP after starting spironolactone was stable (K 5.0, Cr 0.99, eGFR 59).   Today ***  Home 129/81 hr 77 Home 119/75 HR 79 Office 118/70  Current CHF meds: empagliflozin (Jardiance) 10 mg daily, carvedilol 6.25mg  twice a day, valsartan 40 mg twice a day, spironolactone 12.5 mg daily, furosemide 20 mg BID as needed,  Other HTN meds: hydrochlorothiazide 25mg  daily Previously tried:  Adherence Assessment  Do you ever forget to take your medication? [] Yes [] No  Do you ever skip doses due to side effects? [x] Yes [] No  Do you have trouble affording your medicines? [] Yes [x] No  Are you ever unable to pick up your medication due to transportation difficulties? [] Yes [x] No  Do you ever stop taking your medications because you don't believe they are helping? [] Yes [x] No  Do you check your weight daily? [x] Yes [] No    BP goal: <130/80  Family History:  Family History  Problem Relation Age of Onset   Hypertension Mother    Hyperlipidemia Mother    Alzheimer's disease Mother    Emphysema Father  COPD Father      Social History:  Social History   Socioeconomic History   Marital status: Divorced    Spouse name: Not on file   Number of children: 1   Years of education: Not on file   Highest education level: Some college, no degree  Occupational History    Comment: work from home  Tobacco Use   Smoking status: Never   Smokeless tobacco: Never  Vaping Use   Vaping status: Never Used  Substance and Sexual Activity   Alcohol use: No   Drug use: No   Sexual activity: Not Currently  Other Topics Concern   Not on file  Social History Narrative   Lives alone   Caffeine- coffee 2 c daily   Social Determinants of Health   Financial Resource Strain: Low Risk   (01/03/2018)   Overall Financial Resource Strain (CARDIA)    Difficulty of Paying Living Expenses: Not hard at all  Food Insecurity: No Food Insecurity (09/14/2021)   Hunger Vital Sign    Worried About Running Out of Food in the Last Year: Never true    Ran Out of Food in the Last Year: Never true  Transportation Needs: No Transportation Needs (01/03/2018)   PRAPARE - Administrator, Civil Service (Medical): No    Lack of Transportation (Non-Medical): No  Physical Activity: Insufficiently Active (01/03/2018)   Exercise Vital Sign    Days of Exercise per Week: 3 days    Minutes of Exercise per Session: 10 min  Stress: Stress Concern Present (01/03/2018)   [employer] of Occupational Health - Occupational Stress Questionnaire    Feeling of Stress : To some extent  Social Connections: Moderately Isolated (01/03/2018)   Social Connection and Isolation Panel [NHANES]    Frequency of Communication with Friends and Family: Twice a week    Frequency of Social Gatherings with Friends and Family: Twice a week    Attends Religious Services: Never    Database administrator or Organizations: No    Attends Banker Meetings: Never    Marital Status: Divorced  Catering manager Violence: Not At Risk (01/03/2018)   Humiliation, Afraid, Rape, and Kick questionnaire    Fear of Current or Ex-Partner: No    Emotionally Abused: No    Physically Abused: No    Sexually Abused: No     Diet: Not discussed in detail today  Exercise: Very limited due to breaking her ankle.  Walks with a cane slowly  Home BP readings: 121/64 ,111/64, 98/58, 104/61, 104/58, 116/69, 136/69, 113/72  HR 61-72  Wt Readings from Last 3 Encounters:  05/04/23 258 lb (117 kg)  03/18/23 262 lb 12.8 oz (119.2 kg)  02/10/23 258 lb (117 kg)   BP Readings from Last 3 Encounters:  05/23/23 120/70  05/04/23 119/70  04/04/23 123/68   Pulse Readings from Last 3 Encounters:  05/23/23 69  05/04/23 80   04/04/23 83    Renal function: CrCl cannot be calculated (Unknown ideal weight.).  Past Medical History:  Diagnosis Date   Asymptomatic varicose veins    Cancer (HCC)    breast    Diverticulosis    DM type 2 (diabetes mellitus, type 2) (HCC) 10/23/2014   Dysuria    GERD (gastroesophageal reflux disease)    Hiatal hernia    Hyperlipidemia    Malignant neoplasm of breast (female), unspecified site    Migraine without aura, without mention of intractable migraine without mention of  status migrainosus    Other abnormal blood chemistry    Pain in joint, pelvic region and thigh    Shingles    in eyes   Stricture and stenosis of esophagus    Trigger finger (acquired)    Unspecified cataract    Unspecified glaucoma(365.9)     Current Outpatient Medications on File Prior to Visit  Medication Sig Dispense Refill   Calcium-Phosphorus-Vitamin D (CITRACAL +D3 PO) Take 2 tablets by mouth daily with breakfast.     carvedilol (COREG) 6.25 MG tablet Take 1 tablet (6.25 mg total) by mouth 2 (two) times daily. 180 tablet 3   Cholecalciferol (VITAMIN D-3) 5000 units TABS Take 5,000 Units by mouth daily.     Cyanocobalamin 5000 MCG CAPS Take 5,000 mcg by mouth in the morning.     empagliflozin (JARDIANCE) 10 MG TABS tablet Take 1 tablet (10 mg total) by mouth daily before breakfast. 90 tablet 3   fluorometholone (FML) 0.1 % ophthalmic suspension Place 1 drop into the right eye in the morning.     furosemide (LASIX) 20 MG tablet Take 1 tablet (20 mg total) by mouth 2 (two) times daily as needed. 180 tablet 3   HYDROcodone-acetaminophen (NORCO) 10-325 MG tablet Take 1 tablet by mouth every 6 (six) hours as needed for severe pain.     ibuprofen (ADVIL) 200 MG tablet Take 200 mg by mouth as needed for mild pain or headache.     omeprazole (PRILOSEC) 40 MG capsule Take 40 mg by mouth daily.     phenazopyridine (AZO-STANDARD) 95 MG tablet Take 1 tablet by mouth as needed.     polyethylene glycol  (MIRALAX / GLYCOLAX) 17 g packet Take 17 g by mouth 2 (two) times daily. (Patient taking differently: Take 17 g by mouth as needed for moderate constipation.) 14 each 0   predniSONE (DELTASONE) 50 MG tablet Take one tablet by mouth 13 hours, 7 hours and one hour prior to CT Scan 3 tablet 0   rosuvastatin (CRESTOR) 10 MG tablet Take 1 tablet (10 mg total) by mouth daily. (Patient not taking: Reported on 05/04/2023) 90 tablet 3   spironolactone (ALDACTONE) 25 MG tablet Take 0.5 tablets (12.5 mg total) by mouth daily. 90 tablet 3   timolol (TIMOPTIC) 0.5 % ophthalmic solution Place 1 drop into the right eye in the morning and at bedtime.     valACYclovir (VALTREX) 1000 MG tablet Take 1,000 mg by mouth every other day.     valsartan (DIOVAN) 40 MG tablet Take 1 tablet (40 mg total) by mouth 2 (two) times daily. 60 tablet 11   VOLTAREN 1 % GEL Apply 2-4 g topically 4 (four) times daily as needed (for pain (hands, neck, shoulders, etc..)).     No current facility-administered medications on file prior to visit.    Allergies  Allergen Reactions   Adhesive [Tape] Dermatitis and Other (See Comments)    Makes the skin "blood red"   Cymbalta [Duloxetine Hcl] Other (See Comments)    sleepiness   Gabapentin Other (See Comments)    Sleepiness    Betimol [Timolol Maleate] Other (See Comments)    Allergic to preservatives - redness and affects eye pressure   Cosopt [Dorzolamide Hcl-Timolol Mal] Other (See Comments)    Turns eyes red, increases eye pressure   Penicillin G Other (See Comments)    Reaction not recalled   Penicillins Other (See Comments)    Reaction not recalled   Prednisolone Acetate Other (See Comments)  Caused increased eye pressure   Tamoxifen Other (See Comments)    Vaginal bleeding   Thimerosal (Thiomersal) Other (See Comments)    Makes pt eyes red    Iodine-131 Rash and Other (See Comments)    Pt does need pre-meds for CT contrast per [facility] Radiology 10/20/2021 Dye from  PET scan only, states has had since and did ok   Latex Rash     Assessment/Plan:  1. CHF -  No problem-specific Assessment & Plan notes found for this encounter.    Thank you   Nils Pyle, PharmD PGY1 Pharmacy Resident  Olene Floss, Pharm.D, BCACP, BCPS, CPP [facility] A Division of [facility] [address], [place], [place] [address]  Phone: [phone]; Fax: [phone]

## 2023-06-22 ENCOUNTER — Other Ambulatory Visit: Payer: Self-pay | Admitting: Internal Medicine

## 2023-06-22 ENCOUNTER — Other Ambulatory Visit: Payer: Self-pay | Admitting: *Deleted

## 2023-06-22 ENCOUNTER — Telehealth: Payer: Self-pay | Admitting: Internal Medicine

## 2023-06-22 DIAGNOSIS — R252 Cramp and spasm: Secondary | ICD-10-CM

## 2023-06-22 DIAGNOSIS — E875 Hyperkalemia: Secondary | ICD-10-CM

## 2023-06-22 NOTE — Telephone Encounter (Signed)
Pt c/o medication issue:  1. Name of Medication: Potassium  2. How are you currently taking this medication (dosage and times per day)? As written  3. Are you having a reaction (difficulty breathing--STAT)? none  4. What is your medication issue? Pt is wanting to know if she should take her potassium because her potassium was too high but she is having severe cramps

## 2023-06-22 NOTE — Telephone Encounter (Signed)
Pt called in to report having leg pain that doesn't feel is r/t arthritis.  Potassium supplement was stopped d/t a previously high reading.  Pt wants to know if it would be safe to start back taking potassium.  Advised pt to f/u with PCP.  Pt insisting that our office needs to advise as she is on diuretic and Jardiance.  Advised pt PCP would be better resource if Potassium is wnl they can further investigate leg pain. Pt continued to express concern that cardiology should follow.  Advised pt will place order for labs but will not be reviewed by our office until Monday as this is a holiday week.  Pt is still agreeable to have labs drawn. Advised of our Lab hours. Order for BMP placed and released for draw.

## 2023-06-23 LAB — BASIC METABOLIC PANEL
BUN/Creatinine Ratio: 21 (ref 12–28)
BUN: 21 mg/dL (ref 8–27)
CO2: 24 mmol/L (ref 20–29)
Calcium: 9.8 mg/dL (ref 8.7–10.3)
Chloride: 99 mmol/L (ref 96–106)
Creatinine, Ser: 0.99 mg/dL (ref 0.57–1.00)
Glucose: 97 mg/dL (ref 70–99)
Potassium: 4.7 mmol/L (ref 3.5–5.2)
Sodium: 134 mmol/L (ref 134–144)
eGFR: 59 mL/min/{1.73_m2} — ABNORMAL LOW (ref >59)

## 2023-07-05 ENCOUNTER — Ambulatory Visit: Payer: Medicare Other | Admitting: Podiatry

## 2023-07-05 ENCOUNTER — Ambulatory Visit: Payer: Medicare Other

## 2023-07-05 ENCOUNTER — Other Ambulatory Visit: Payer: Medicare Other

## 2023-07-07 ENCOUNTER — Telehealth: Payer: Self-pay | Admitting: Internal Medicine

## 2023-07-07 MED ORDER — FUROSEMIDE 20 MG PO TABS: ORAL_TABLET | ORAL | 3 refills | Status: DC

## 2023-07-07 NOTE — Telephone Encounter (Signed)
Pt c/o medication issue:  1. Name of Medication:   empagliflozin (JARDIANCE) 10 MG TABS tablet   2. How are you currently taking this medication (dosage and times per day)?   As prescribed  3. Are you having a reaction (difficulty breathing--STAT)?   Feet swelling, ache in neck  4. What is your medication issue?   Patient stated she is unable to sleep and has restless leg syndrome which she thinks is caused by this medication.  Patient stated she has been taking this medication for 5-6 weeks and it is getting progressively worse.  Patient wants a call back directly from Pharmacist Melissa.

## 2023-07-07 NOTE — Telephone Encounter (Signed)
Feet are swelling, legs are stiff, joint aches, cramps every night Ok to hold [place] - unlikely the cause but ok to hold She has had issues with restless leg previous Increase furosemide to 40mg  up to twice a day Patient to follow up Monday

## 2023-07-08 ENCOUNTER — Telehealth: Payer: Self-pay | Admitting: *Deleted

## 2023-07-08 NOTE — Telephone Encounter (Signed)
I spoke with patient.  She was due for follow up lab work in December after Rosuvastatin was prescribed in September.  Patient reports she has not started Rosuvastatin.  She would like to have baseline lab work checked. She will have fasting lipid and liver profiles done soon at [facility]

## 2023-07-14 ENCOUNTER — Encounter: Payer: Self-pay | Admitting: Podiatry

## 2023-07-14 ENCOUNTER — Ambulatory Visit (INDEPENDENT_AMBULATORY_CARE_PROVIDER_SITE_OTHER): Payer: Medicare Other | Admitting: Podiatry

## 2023-07-14 DIAGNOSIS — E1142 Type 2 diabetes mellitus with diabetic polyneuropathy: Secondary | ICD-10-CM

## 2023-07-14 DIAGNOSIS — B351 Tinea unguium: Secondary | ICD-10-CM | POA: Diagnosis not present

## 2023-07-14 DIAGNOSIS — M79675 Pain in left toe(s): Secondary | ICD-10-CM

## 2023-07-14 DIAGNOSIS — M79674 Pain in right toe(s): Secondary | ICD-10-CM

## 2023-07-14 NOTE — Progress Notes (Signed)
Complaint:  Visit Type: Patient returns to my office for continued preventative foot care services. Complaint: Patient states" my third toenails both feet  have grown long and thick and become painful to walk and wear shoes"  The patient presents for preventative foot care services. No changes to ROS.    Podiatric Exam: Vascular: dorsalis pedis and posterior tibial pulses are weakly  palpable bilateral due to foot swelling.. Capillary return is immediate. Temperature gradient is WNL. Skin turgor WNL  Sensorium: Normal Semmes Weinstein monofilament test. Normal tactile sensation bilaterally. Nail Exam: Pt has thick disfigured discolored nails with subungual debris noted first and third third toenails both feet. Ulcer Exam: There is no evidence of ulcer or pre-ulcerative changes or infection. Orthopedic Exam: Muscle tone and strength are WNL. No limitations in general ROM. No crepitus or effusions noted. Foot type and digits show no abnormalities. Bony prominences are unremarkable. Skin: No Porokeratosis. No infection or ulcers  Diagnosis:  Onychomycosis, , Pain in right toe, pain in left toes  Treatment & Plan Procedures and Treatment: Consent by patient was obtained for treatment procedures.   Debridement of mycotic and hypertrophic toenails, 1 through 5 bilateral and clearing of subungual debris. No ulceration, no infection noted.   Return Visit-Office Procedure: Patient instructed to return to the office for a follow up visit 4 months   for continued evaluation and treatment.    Helane Gunther DPM

## 2023-07-22 NOTE — Telephone Encounter (Signed)
Spoke with patient. Reminded her of the need to go for lab work. She states swelling is a little better. Was able to prop her feet and it has helped a lot. She will increase to furosemide 40mg  BID and prop her feet more often.

## 2023-08-09 ENCOUNTER — Telehealth: Payer: Self-pay | Admitting: Internal Medicine

## 2023-08-09 ENCOUNTER — Ambulatory Visit: Payer: Self-pay

## 2023-08-09 NOTE — Telephone Encounter (Signed)
 Pt would like a cb from the pharmacist

## 2023-08-09 NOTE — Progress Notes (Deleted)
 Patient ID: [name]                 DOB: 1945/08/24                      MRN: [id number]     HPI: [name] is a 78 y.o. female referred by Dr. Dann to pharmacy clinic for HF medication management. PMH is significant for pre-DM, melanoma, new dx of HFrEF. Most recent LVEF 25% on 03/10/23.  Patient seen in clinic 10/9. She reported chronic swelling after her fall last year when she broke her ankle.  She was taking torsemide  10 mg daily for several days in a row but then would get some cystitis symptoms.  She would stop the torsemide  for a few days and take her Azo and then resume her torsemide .  Home blood pressure cuff found to be accurate.  She was concerned about cost of medications, even though we did discuss the [employer] grant.  She wanted to try one of the older medications first therefore her HCTZ was stopped and she was started on valsartan  40 mg twice daily.    She contacted the clinic 10/17 with concerns about swelling after stopping her hydrochlorothiazide.  I instructed her to increase her torsemide  from 10 mg to 20 mg however she called the next day stating that after taking the higher dose she peed excessively and then got to the point where she felt like she needed to pee but could not with having a lot of pressure like when she has cystitis.  Did not want to take the torsemide  anymore.  I asked her to find her compression stockings and to hold the torsemide  through the weekend.  She came in for lab work on Monday, kidney function stable potassium 4.9.  She had taken 1 dose of torsemide  on Monday to help with the swelling.  Reports compression stockings only helping minimally.  She was started on spironolactone  12.5 mg daily and her torsemide  was changed to furosemide  20 mg daily as needed.  She was seen in clinic 10/28. Jardiance  10mg  daily was added and patient was instructed to take furosemide  twice a day as needed. She canceled her follow up appointment. She called the  clinic 12/12 complaining of restless legs, stiff joints, cramps. Thought it was from Jardiance . She also reported swollen feet. Advised she could hold Jardiance  to see if it improved her joint aches. Furosemide  was increased to 40mg  twice a day. She was asked to come for labs after increasing dose, but patient did not come.   Any improvement off Jardiance ? Swelling? Dose of furosemide ? Needs labs BP? HR?    Patient presents today for follow-up.  She reports some response to the furosemide .  Swelling improves for part of the day and then increases.  However she is no longer having any issues being able to pee.  Asking if she can take it twice a day.  Denies any dizziness or lightheadedness.  Overall no shortness of breath, she did have 1 episode walking into a restaurant where she felt a little winded.  Complains of achiness and hip pain.  Home blood pressures vary but are generally low 100s to 110s over high 50s to 70.  Heart rate between 60 and 70.   Discussion with patient today included the following: cardiac medication indications, introduction to GDMT clinic, reasoning behind medication titration, importance of medication adherence, and patient engagement.    Home 129/81 hr 77 Home  119/75 HR 79 Office 118/70  Current CHF meds: carvedilol  6.25mg  twice a day, valsartan  40 mg twice a day, furosemide  40 mg twice daily as needed, spironolactone  12.5 mg daily, Jardiance  10mg  daily? Other HTN meds:  Previously tried:  Adherence Assessment  Do you ever forget to take your medication? [] Yes [] No  Do you ever skip doses due to side effects? [x] Yes [] No  Do you have trouble affording your medicines? [] Yes [x] No  Are you ever unable to pick up your medication due to transportation difficulties? [] Yes [x] No  Do you ever stop taking your medications because you don't believe they are helping? [] Yes [x] No  Do you check your weight daily? [x] Yes [] No    BP goal: <130/80  Family History:   Family History  Problem Relation Age of Onset   Hypertension Mother    Hyperlipidemia Mother    Alzheimer's disease Mother    Emphysema Father    COPD Father      Social History:  Social History   Socioeconomic History   Marital status: Divorced    Spouse name: Not on file   Number of children: 1   Years of education: Not on file   Highest education level: Some college, no degree  Occupational History    Comment: work from home  Tobacco Use   Smoking status: Never   Smokeless tobacco: Never  Vaping Use   Vaping status: Never Used  Substance and Sexual Activity   Alcohol use: No   Drug use: No   Sexual activity: Not Currently  Other Topics Concern   Not on file  Social History Narrative   Lives alone   Caffeine- coffee 2 c daily   Social Drivers of Health   Financial Resource Strain: Low Risk  (01/03/2018)   Overall Financial Resource Strain (CARDIA)    Difficulty of Paying Living Expenses: Not hard at all  Food Insecurity: No Food Insecurity (09/14/2021)   Hunger Vital Sign    Worried About Running Out of Food in the Last Year: Never true    Ran Out of Food in the Last Year: Never true  Transportation Needs: No Transportation Needs (01/03/2018)   PRAPARE - Administrator, Civil Service (Medical): No    Lack of Transportation (Non-Medical): No  Physical Activity: Insufficiently Active (01/03/2018)   Exercise Vital Sign    Days of Exercise per Week: 3 days    Minutes of Exercise per Session: 10 min  Stress: Stress Concern Present (01/03/2018)   [employer] of Occupational Health - Occupational Stress Questionnaire    Feeling of Stress : To some extent  Social Connections: Moderately Isolated (01/03/2018)   Social Connection and Isolation Panel [NHANES]    Frequency of Communication with Friends and Family: Twice a week    Frequency of Social Gatherings with Friends and Family: Twice a week    Attends Religious Services: Never    Doctor, General Practice or Organizations: No    Attends Banker Meetings: Never    Marital Status: Divorced  Catering Manager Violence: Not At Risk (01/03/2018)   Humiliation, Afraid, Rape, and Kick questionnaire    Fear of Current or Ex-Partner: No    Emotionally Abused: No    Physically Abused: No    Sexually Abused: No     Diet: Not discussed in detail today  Exercise: Very limited due to breaking her ankle.  Walks with a cane slowly  Home BP readings: 121/64 ,111/64, 98/58, 104/61, 104/58,  116/69, 136/69, 113/72  HR 61-72  Wt Readings from Last 3 Encounters:  05/04/23 258 lb (117 kg)  03/18/23 262 lb 12.8 oz (119.2 kg)  02/10/23 258 lb (117 kg)   BP Readings from Last 3 Encounters:  05/23/23 120/70  05/04/23 119/70  04/04/23 123/68   Pulse Readings from Last 3 Encounters:  05/23/23 69  05/04/23 80  04/04/23 83    Renal function: CrCl cannot be calculated (Patient's most recent lab result is older than the maximum 21 days allowed.).  Past Medical History:  Diagnosis Date   Asymptomatic varicose veins    Cancer (HCC)    breast    Diverticulosis    DM type 2 (diabetes mellitus, type 2) (HCC) 10/23/2014   Dysuria    GERD (gastroesophageal reflux disease)    Hiatal hernia    Hyperlipidemia    Malignant neoplasm of breast (female), unspecified site    Migraine without aura, without mention of intractable migraine without mention of status migrainosus    Other abnormal blood chemistry    Pain in joint, pelvic region and thigh    Shingles    in eyes   Stricture and stenosis of esophagus    Trigger finger (acquired)    Unspecified cataract    Unspecified glaucoma(365.9)     Current Outpatient Medications on File Prior to Visit  Medication Sig Dispense Refill   Calcium -Phosphorus-Vitamin D  (CITRACAL +D3 PO) Take 2 tablets by mouth daily with breakfast.     carvedilol  (COREG ) 6.25 MG tablet Take 1 tablet (6.25 mg total) by mouth 2 (two) times daily. 180 tablet 3    Cholecalciferol  (VITAMIN D -3) 5000 units TABS Take 5,000 Units by mouth daily.     Cyanocobalamin  5000 MCG CAPS Take 5,000 mcg by mouth in the morning.     empagliflozin  (JARDIANCE ) 10 MG TABS tablet Take 1 tablet (10 mg total) by mouth daily before breakfast. 90 tablet 3   fluorometholone  (FML) 0.1 % ophthalmic suspension Place 1 drop into the right eye in the morning.     furosemide  (LASIX ) 20 MG tablet Take 2 tablets up to twice a day as needed for swelling 180 tablet 3   HYDROcodone -acetaminophen  (NORCO) 10-325 MG tablet Take 1 tablet by mouth every 6 (six) hours as needed for severe pain.     ibuprofen (ADVIL) 200 MG tablet Take 200 mg by mouth as needed for mild pain or headache.     omeprazole  (PRILOSEC) 40 MG capsule Take 40 mg by mouth daily.     phenazopyridine (AZO-STANDARD) 95 MG tablet Take 1 tablet by mouth as needed.     polyethylene glycol (MIRALAX  / GLYCOLAX ) 17 g packet Take 17 g by mouth 2 (two) times daily. (Patient taking differently: Take 17 g by mouth as needed for moderate constipation.) 14 each 0   predniSONE  (DELTASONE ) 50 MG tablet Take one tablet by mouth 13 hours, 7 hours and one hour prior to CT Scan 3 tablet 0   rosuvastatin  (CRESTOR ) 10 MG tablet Take 1 tablet (10 mg total) by mouth daily. 90 tablet 3   spironolactone  (ALDACTONE ) 25 MG tablet Take 0.5 tablets (12.5 mg total) by mouth daily. 90 tablet 3   timolol  (TIMOPTIC ) 0.5 % ophthalmic solution Place 1 drop into the right eye in the morning and at bedtime.     valACYclovir  (VALTREX ) 1000 MG tablet Take 1,000 mg by mouth every other day.     valsartan  (DIOVAN ) 40 MG tablet Take 1 tablet (40 mg total)  by mouth 2 (two) times daily. 60 tablet 11   VOLTAREN  1 % GEL Apply 2-4 g topically 4 (four) times daily as needed (for pain (hands, neck, shoulders, etc..)).     No current facility-administered medications on file prior to visit.    Allergies  Allergen Reactions   Adhesive [Tape] Dermatitis and Other (See  Comments)    Makes the skin blood red   Cymbalta  [Duloxetine  Hcl] Other (See Comments)    sleepiness   Gabapentin  Other (See Comments)    Sleepiness    Betimol  [Timolol  Maleate] Other (See Comments)    Allergic to preservatives - redness and affects eye pressure   Cosopt [Dorzolamide Hcl-Timolol  Mal] Other (See Comments)    Turns eyes red, increases eye pressure   Penicillin G Other (See Comments)    Reaction not recalled   Penicillins Other (See Comments)    Reaction not recalled   Prednisolone Acetate Other (See Comments)    Caused increased eye pressure   Tamoxifen Other (See Comments)    Vaginal bleeding   Thimerosal (Thiomersal) Other (See Comments)    Makes pt eyes red    Iodine-131 Rash and Other (See Comments)    Pt does need pre-meds for CT contrast per [facility] 10/20/2021 Dye from PET scan only, states has had since and did ok   Latex Rash     Assessment/Plan:  1. CHF -  No problem-specific Assessment & Plan notes found for this encounter.    Thank you   Kmya Placide D Klyde Banka, Pharm.D, BCACP, BCPS, CPP [facility] of [facility] [address], [place], [place] [address]  Phone: [phone]; Fax: [phone]
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
## 2023-08-10 NOTE — Telephone Encounter (Signed)
 I spoke with patient. Her pain did go away after stopping Jardiance . She will resume Jardiance  and see if it comes back. She says the furosemide  really isnt working. Would like to go back to toresmide. Said she was on 10mg  then it was increased to 20mg . States that after a few days of 20mg  she feels like she is getting cystitis but it helps her swelling. She would like to try 10mg  daily. Adivsed ok. Reminded her she still needs labs. She said her ramp is iced over and she will come as soon as she can get out of the house.

## 2023-08-10 NOTE — Telephone Encounter (Signed)
 She has stopped taking Jardiance  due to severe joint pain. However she wants to re-try it. Her swelling improved but not gone completley. Her current diuretic - Lasix   40 mg twice daily. Requesting call back from [facility].

## 2023-08-21 ENCOUNTER — Encounter: Payer: Self-pay | Admitting: Internal Medicine

## 2023-08-21 LAB — BASIC METABOLIC PANEL
BUN/Creatinine Ratio: 27 (ref 12–28)
BUN: 26 mg/dL (ref 8–27)
CO2: 22 mmol/L (ref 20–29)
Calcium: 10.1 mg/dL (ref 8.7–10.3)
Chloride: 100 mmol/L (ref 96–106)
Creatinine, Ser: 0.95 mg/dL (ref 0.57–1.00)
Glucose: 113 mg/dL — ABNORMAL HIGH (ref 70–99)
Potassium: 4.5 mmol/L (ref 3.5–5.2)
Sodium: 139 mmol/L (ref 134–144)
eGFR: 61 mL/min/{1.73_m2} (ref >59)

## 2023-08-22 ENCOUNTER — Ambulatory Visit: Payer: Medicare Other | Attending: Internal Medicine | Admitting: Internal Medicine

## 2023-08-22 VITALS — BP 118/60 | HR 76 | Ht 64.0 in | Wt 252.8 lb

## 2023-08-22 DIAGNOSIS — E66812 Obesity, class 2: Secondary | ICD-10-CM | POA: Insufficient documentation

## 2023-08-22 DIAGNOSIS — I5043 Acute on chronic combined systolic (congestive) and diastolic (congestive) heart failure: Secondary | ICD-10-CM | POA: Diagnosis present

## 2023-08-22 DIAGNOSIS — I2583 Coronary atherosclerosis due to lipid rich plaque: Secondary | ICD-10-CM | POA: Diagnosis present

## 2023-08-22 DIAGNOSIS — C50511 Malignant neoplasm of lower-outer quadrant of right female breast: Secondary | ICD-10-CM | POA: Diagnosis present

## 2023-08-22 DIAGNOSIS — Z6839 Body mass index (BMI) 39.0-39.9, adult: Secondary | ICD-10-CM | POA: Diagnosis present

## 2023-08-22 DIAGNOSIS — Z170 Estrogen receptor positive status [ER+]: Secondary | ICD-10-CM | POA: Diagnosis present

## 2023-08-22 DIAGNOSIS — E785 Hyperlipidemia, unspecified: Secondary | ICD-10-CM | POA: Insufficient documentation

## 2023-08-22 DIAGNOSIS — E6609 Other obesity due to excess calories: Secondary | ICD-10-CM | POA: Diagnosis present

## 2023-08-22 DIAGNOSIS — I2510 Atherosclerotic heart disease of native coronary artery without angina pectoris: Secondary | ICD-10-CM | POA: Insufficient documentation

## 2023-08-22 MED ORDER — FUROSEMIDE 40 MG PO TABS: 40.0000 mg | ORAL_TABLET | Freq: Every day | ORAL | 3 refills | Status: DC

## 2023-08-22 NOTE — Progress Notes (Signed)
Cardiology Office Note:  .    Date:  08/22/2023  ID:  [name], DOB 02-13-1946, MRN [id number] PCP: System, Provider Not In  [facility] Providers Cardiologist:  Lance Muss, MD     CC: Transition to new cardiologist  History of Present Illness: .    [name] is a 78 y.o. female with a history of heart failure with reduced ejection fraction, nonobstructive cardiomyopathy, and minimal nonobstructive coronary artery disease, presents for establishing care and management of her heart condition. The patient's heart condition was discovered incidentally on a CT scan, which showed an enlarged heart. The patient reported an episode of chest pain on the right side, which led to an emergency visit where her blood pressure was found to be elevated at 220/120. An EKG was performed at that time, but no further information was provided to the patient regarding her heart condition.  The patient also reported a history of leg swelling, which has been persistent and not well managed. She has a history of injuries to both legs, including a broken ankle two years ago and an injury to the other leg 50 years ago. The patient has been on furosemide for the leg swelling, which she takes every morning. She also reported a history of taking torsemide, which she stopped due to a sensation of cystitis after three days of use.  The patient has a history of two cancers, breast cancer and melanoma, for which she received radiation and immunotherapy, respectively. She reported a decrease in energy following these treatments. She also reported a raspy voice, which she noticed after starting valsartan. The patient also reported a lack of energy and decreased mobility due to her musculoskeletal issues.  The patient has been on several medications for her heart condition, including carvedilol, Jardiance, furosemide, and valsartan. She reported aching all over after starting Jardiance, which led her to stop  the medication for a month. She has since restarted Jardiance and has not reported any further issues. The patient also reported a history of hyperlipidemia but has not been taking rosuvastatin due to concerns about Alzheimer's disease.  Relevant histories: .  Social - former athlete ROS: As per HPI.   Studies Reviewed: .   Cardiac Studies & Procedures      ECHOCARDIOGRAM  ECHOCARDIOGRAM COMPLETE 03/10/2023  Narrative ECHOCARDIOGRAM REPORT    Patient Name:   [name] Date of Exam: 03/10/2023 Medical Rec #:  [id number]     Height:       64.0 in Accession #:    [id number]    Weight:       258.0 lb Date of Birth:  [birth year]     BSA:          2.180 m Patient Age:    76 years      BP:           130/70 mmHg Patient Gender: F             HR:           88 bpm. Exam Location:  [address]  Procedure: 2D Echo, Cardiac Doppler, Color Doppler and Strain Analysis  Indications:    SOB (shortness of breath) [401027]  History:        Patient has no prior history of Echocardiogram examinations. Signs/Symptoms:Shortness of Breath; Risk Factors:Diabetes and Dyslipidemia.  Sonographer:    Eulah Pont RDCS Referring Phys: Leanora Cover   Sonographer Comments: Global longitudinal strain was attempted. IMPRESSIONS  1. Left ventricular ejection fraction, by estimation, is 20 to 25%. The left ventricle has severely decreased function. The left ventricle demonstrates global hypokinesis. Left ventricular diastolic parameters are consistent with Grade I diastolic dysfunction (impaired relaxation). Elevated left atrial pressure. The average left ventricular global longitudinal strain is -13.3 %. The global longitudinal strain is abnormal. 2. Right ventricular systolic function is normal. The right ventricular size is normal. 3. The mitral valve is normal in structure. Mild mitral valve regurgitation. 4. The aortic valve is tricuspid. Aortic valve regurgitation is not  visualized.  FINDINGS Left Ventricle: Left ventricular ejection fraction, by estimation, is 20 to 25%. The left ventricle has severely decreased function. The left ventricle demonstrates global hypokinesis. The average left ventricular global longitudinal strain is -13.3 %. The global longitudinal strain is abnormal. The left ventricular internal cavity size was normal in size. There is no left ventricular hypertrophy. Left ventricular diastolic parameters are consistent with Grade I diastolic dysfunction (impaired relaxation). Elevated left atrial pressure.  Right Ventricle: The right ventricular size is normal. Right vetricular wall thickness was not assessed. Right ventricular systolic function is normal.  Left Atrium: Left atrial size was normal in size.  Right Atrium: Right atrial size was normal in size.  Pericardium: There is no evidence of pericardial effusion.  Mitral Valve: The mitral valve is normal in structure. Mild mitral valve regurgitation.  Tricuspid Valve: The tricuspid valve is normal in structure. Tricuspid valve regurgitation is trivial.  Aortic Valve: The aortic valve is tricuspid. Aortic valve regurgitation is not visualized.  Pulmonic Valve: The pulmonic valve was normal in structure. Pulmonic valve regurgitation is trivial.  Aorta: The aortic root and ascending aorta are structurally normal, with no evidence of dilitation.  IAS/Shunts: No atrial level shunt detected by color flow Doppler.   LEFT VENTRICLE PLAX 2D LVIDd:         5.10 cm      Diastology LVIDs:         4.60 cm      LV e' medial:    4.95 cm/s LV PW:         0.90 cm      LV E/e' medial:  18.9 LV IVS:        0.90 cm      LV e' lateral:   6.04 cm/s LVOT diam:     1.80 cm      LV E/e' lateral: 15.5 LV SV:         68 LV SV Index:   31           2D Longitudinal Strain LVOT Area:     2.54 cm     2D Strain GLS Avg:     -13.3 %  LV Volumes (MOD) LV vol d, MOD A2C: 156.0 ml LV vol d, MOD A4C:  135.0 ml LV vol s, MOD A2C: 90.7 ml LV vol s, MOD A4C: 78.6 ml LV SV MOD A2C:     65.3 ml LV SV MOD A4C:     135.0 ml LV SV MOD BP:      66.8 ml  RIGHT VENTRICLE RV S prime:     10.10 cm/s TAPSE (M-mode): 2.5 cm  LEFT ATRIUM             Index        RIGHT ATRIUM           Index LA diam:        4.10 cm 1.88 cm/m   RA  Area:     11.60 cm LA Vol (A2C):   42.0 ml 19.27 ml/m  RA Volume:   26.50 ml  12.16 ml/m LA Vol (A4C):   42.5 ml 19.50 ml/m LA Biplane Vol: 43.8 ml 20.09 ml/m AORTIC VALVE LVOT Vmax:   129.00 cm/s LVOT Vmean:  91.100 cm/s LVOT VTI:    0.267 m  AORTA Ao Root diam: 3.10 cm Ao Asc diam:  2.70 cm  MITRAL VALVE MV Area (PHT): 5.13 cm     SHUNTS MV Decel Time: 148 msec     Systemic VTI:  0.27 m MV E velocity: 93.60 cm/s   Systemic Diam: 1.80 cm MV A velocity: 125.00 cm/s MV E/A ratio:  0.75  Dietrich Pates MD Electronically signed by Dietrich Pates MD Signature Date/Time: 03/10/2023/4:37:02 PM    Final    CT SCANS  CT CORONARY MORPH W/CTA COR W/SCORE 04/04/2023  Addendum 04/14/2023  7:25 PM ADDENDUM REPORT: 04/14/2023 19:22  EXAM: OVER-READ INTERPRETATION  CT CHEST  The following report is an over-read performed by radiologist Dr. Curly Shores [facility] on 04/14/2023. This over-read does not include interpretation of cardiac or coronary anatomy or pathology. The coronary CTA interpretation by the cardiologist is attached.  COMPARISON:  04/01/2021.  FINDINGS: Cardiovascular:  See findings discussed in the body of the report.  Mediastinum/Nodes: No suspicious adenopathy identified. Imaged mediastinal structures are unremarkable.  Lungs/Pleura: Imaged lungs are clear. No pleural effusion or pneumothorax.  Upper Abdomen: No acute abnormality.  Musculoskeletal: No chest wall abnormality. There are thoracic degenerative changes.  IMPRESSION: No significant extracardiac incidental findings identified.   Electronically Signed By:  Layla Maw M.D. On: 04/14/2023 19:22  Narrative CLINICAL DATA:  This is a 78 year old with anginal symptoms.  EXAM: Cardiac/Coronary  CTA  TECHNIQUE: The patient was scanned on a [employer].  FINDINGS: A 100 kV prospective scan was triggered in the descending thoracic aorta at 111 HU's. Axial non-contrast 3 mm slices were carried out through the heart. The data set was analyzed on a dedicated work station and scored using the Agatson method. Gantry rotation speed was 250 msecs and collimation was .6 mm. No beta blockade and 0.8 mg of sl NTG was given. The 3D data set was reconstructed in 5% intervals of the 67-82 % of the R-R cycle. Diastolic phases were analyzed on a dedicated work station using MPR, MIP and VRT modes. The patient received 80 cc of contrast.  Aorta: Normal size.  Mild aortic calcifications.  No dissection.  Aortic Valve:  Trileaflet.  Minimal calcifications.  Coronary Arteries:  Normal coronary origin.  Right dominance.  RCA is a large dominant artery that gives rise to PDA and PLA. There is no plaque.  Left main is a large artery that gives rise to LAD and LCX arteries.  LAD is a large with minimal (< 24%) diffuse calcifications. The mid and distal LAD with no plaques. D1 vessel is small caliber vessel with no calcification. D2 vessel is a medium sized vessel with no calcification.  LCX is a non-dominant artery that gives rise to one large OM1 branch. There is no plaque.  Coronary Calcium Score:  Left main: 0  Left anterior descending artery: 85.9  Left circumflex artery: 0  Right coronary artery: 0  Total: 85.9  Percentile: 51  Other findings:  Normal pulmonary vein drainage into the left atrium.  Normal left atrial appendage without a thrombus.  Normal size of the pulmonary artery.  Mild  mitral annular calcification.  IMPRESSION: 1. Coronary calcium score of 85.9. This was 23 percentile for age and sex matched  control.  2. Normal coronary origin with right dominance.  3. CAD-RADS 1. Minimal non-obstructive CAD (0-24%). Consider non-atherosclerotic causes of chest pain. Consider preventive therapy and risk factor modification.  The noncardiac portion of this study will be interpreted in separate report by the radiologist.  Electronically Signed: By: Thomasene Ripple D.O. On: 04/04/2023 11:15           Physical Exam:    VS:  BP 118/60 (BP Location: Left Arm)   Pulse 76   Ht 5\' 4"  (1.626 m)   Wt 252 lb 12.8 oz (114.7 kg)   SpO2 97%   BMI 43.39 kg/m    Wt Readings from Last 3 Encounters:  08/22/23 252 lb 12.8 oz (114.7 kg)  05/04/23 258 lb (117 kg)  03/18/23 262 lb 12.8 oz (119.2 kg)    Gen: no distress, morbid obesity   Neck: No JVD Cardiac: No Rubs or Gallops, no murmur but distant heart sounds, RRR +2 radial pulses Respiratory: Clear to auscultation bilaterally, normal effort, normal  respiratory rate GI: Soft, nontender, non-distended  MS: +1 painful  edema;  moves all extremities Integument: Skin feels warm Neuro:  At time of evaluation, alert and oriented to person/place/time/situation  Psych: Normal affect, patient feels well   ASSESSMENT AND PLAN: .    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 Severely reduced ejection fraction (20-25%). Symptoms include leg swelling, decreased energy, and hyperkalemia. The patient has been on carvedilol, Jardiance, Lasix, and valsartan. Significant muscle aches with Jardiance resolved upon discontinuation and reappeared upon reinitiation. Urinary hesitancy with torsemide. Discussed benefits of medications in reducing heart stress, improving function, and providing mortality benefit. - Increase furosemide to 40 mg daily in the morning. - Check labs in 1-2 weeks to monitor kidney function, electrolytes - Repeat echocardiogram to  assess heart function.  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 Increased risk due to radiation exposure from breast cancer treatment. Not on statin therapy due to concerns about Alzheimer's disease. Discussed increased CAD risk and importance of aggressive cholesterol management. - Check fasting lipid panel in 1-2 weeks. - Discuss non-statin cholesterol-lowering medications if needed (zetia 10 mg PO Daily)  Hyperlipidemia Hyperlipidemia with concerns about Alzheimer's disease preventing statin use. Cholesterol levels need reassessment.  Morbid obesity - no covered indication for GLP-1 RA at this time, would likely improve her sx; we discussed exercise but her prior orthopedic injuries will make this a challenge  Cardiac risk stratification Requires routine follow-up for eye surgery, MRIs for arm monitoring, and esophagus stretching. - patient is moderate to high risk due to significant decrease in LV function; save for LE swelling, she is asymptomatic. - if no clinica change, low risk procedures are reasonable  Follow-up - Follow up with my cardiology team in 3 months.   Riley Lam, MD F[facility] Cardiologist [facility]  [address] [place], [address] [place], [place] [address] [phone]  4:34 PM

## 2023-08-22 NOTE — Patient Instructions (Signed)
Medication Instructions:  Your physician has recommended you make the following change in your medication:  INCREASE: furosemide (Lasix) to 40 mg by mouth once daily  *If you need a refill on your cardiac medications before your next appointment, please call your pharmacy*   Lab Work: IN 2 WEEKS: fasting lipid panel and BMP (nothing to eat or drink 12 hours prior except water)  If you have labs (blood work) drawn today and your tests are completely normal, you will receive your results only by: MyChart Message (if you have MyChart) OR A paper copy in the mail If you have any lab test that is abnormal or we need to change your treatment, we will call you to review the results.   Testing/Procedures: Your physician has requested that you have an echocardiogram. Echocardiography is a painless test that uses sound waves to create images of your heart. It provides your doctor with information about the size and shape of your heart and how well your heart's chambers and valves are working. This procedure takes approximately one hour. There are no restrictions for this procedure. Please do NOT wear cologne, perfume, aftershave, or lotions (deodorant is allowed). Please arrive 15 minutes prior to your appointment time.  Please note: We ask at that you not bring children with you during ultrasound (echo/ vascular) testing. Due to room size and safety concerns, children are not allowed in the ultrasound rooms during exams. Our front office staff cannot provide observation of children in our lobby area while testing is being conducted. An adult accompanying a patient to their appointment will only be allowed in the ultrasound room at the discretion of the ultrasound technician under special circumstances. We apologize for any inconvenience.    Follow-Up: At [facility], you and your health needs are our priority.  As part of our continuing mission to provide you with exceptional heart care,  we have created designated Provider Care Teams.  These Care Teams include your primary Cardiologist (physician) and Advanced Practice Providers (APPs -  Physician Assistants and Nurse Practitioners) who all work together to provide you with the care you need, when you need it.    Your next appointment:   3 month(s)  Provider:   Jari Favre, PA-C, Ronie Spies, PA-C, Robin Searing, NP, Jacolyn Reedy, PA-C, Eligha Bridegroom, NP, Tereso Newcomer, PA-C, or Perlie Gold, PA-C

## 2023-09-16 ENCOUNTER — Ambulatory Visit (HOSPITAL_BASED_OUTPATIENT_CLINIC_OR_DEPARTMENT_OTHER): Payer: Medicare Other

## 2023-09-16 ENCOUNTER — Telehealth: Payer: Self-pay | Admitting: Pharmacist

## 2023-09-16 ENCOUNTER — Ambulatory Visit: Payer: Medicare Other | Attending: Internal Medicine | Admitting: Pharmacist

## 2023-09-16 ENCOUNTER — Encounter: Payer: Self-pay | Admitting: Pharmacist

## 2023-09-16 VITALS — BP 110/60 | Wt 250.4 lb

## 2023-09-16 DIAGNOSIS — E7849 Other hyperlipidemia: Secondary | ICD-10-CM | POA: Diagnosis present

## 2023-09-16 DIAGNOSIS — I5020 Unspecified systolic (congestive) heart failure: Secondary | ICD-10-CM | POA: Diagnosis present

## 2023-09-16 DIAGNOSIS — I5043 Acute on chronic combined systolic (congestive) and diastolic (congestive) heart failure: Secondary | ICD-10-CM

## 2023-09-16 LAB — ECHOCARDIOGRAM COMPLETE
AR max vel: 1.73 cm2
AV Area VTI: 1.72 cm2
AV Area mean vel: 1.63 cm2
AV Mean grad: 9.3 mm[Hg]
AV Peak grad: 17.3 mm[Hg]
Ao pk vel: 2.08 m/s
Area-P 1/2: 3.08 cm2
S' Lateral: 3.9 cm

## 2023-09-16 MED ORDER — EZETIMIBE 10 MG PO TABS: 10.0000 mg | ORAL_TABLET | Freq: Every day | ORAL | 3 refills | Status: DC

## 2023-09-16 NOTE — Assessment & Plan Note (Signed)
Assessment and Plan :  Last lipid lab done 2 years ago. Due for updated lab. Patient is planing to get it done in next few weeks. In the past she was on Crestor.stopped taking it due to some memory concerns. At last visit with Dr.Chandrasekhar she showed interest in starting non-statin treatment. So we will initiate Zetia 10 mg daily.

## 2023-09-16 NOTE — Assessment & Plan Note (Signed)
Assessment: BP in office today 110/60 , home ~ 115/70  Patient has mild swelling on exam, denies any pain Denies any shortness of breath Takes lasix 40 mg daily in the morning and the days when swelling is bad, takes 40 mg in the evening too  Appears she gets initial response to furosemide but then swelling increases throughout the day Discussed SGLT2 inhibitors, decrease in CV mortality and heart failure hospitalization, potential benefit in diuresis and [employer] grant Intolerance to Jardiance 10 mg daily  Patient is agreeable to trying [place]   Plan: Will assess coverage for Farxiga 10 mg  Continue carvedilol 6.25mg  twice a day, valsartan 40 mg twice a day, spironolactone 12.5 mg daily Check BMP today Follow-up in 6 weeks

## 2023-09-16 NOTE — Progress Notes (Signed)
Patient ID: [name]                 DOB: 30-Jan-1946                      MRN: [id number]     HPI: [name] is a 78 y.o. female referred by Dr. Eldridge Dace to pharmacy clinic for HF medication management. PMH is significant for pre-DM, melanoma, new dx of HFrEF. Most recent LVEF 25% on 03/10/23.  Patient seen in clinic 10/9. She reported chronic swelling after her fall last year when she broke her ankle.  She was taking torsemide 10 mg daily for several days in a row but then would get some cystitis symptoms.  She would stop the torsemide for a few days and take her Azo and then resume her torsemide.  Home blood pressure cuff found to be accurate.  She was concerned about cost of medications, even though we did discuss the [employer]well grant.  She wanted to try one of the older medications first therefore her HCTZ was stopped and she was started on valsartan 40 mg twice daily.    She contacted the clinic 10/17 with concerns about swelling after stopping her hydrochlorothiazide.  I instructed her to increase her torsemide from 10 mg to 20 mg however she called the next day stating that after taking the higher dose she peed excessively and then got to the point where she felt like she needed to pee but could not with having a lot of pressure like when she has cystitis.  Did not want to take the torsemide anymore.  I asked her to find her compression stockings and to hold the torsemide through the weekend.  She came in for lab work on Monday, kidney function stable potassium 4.9.  She had taken 1 dose of torsemide on Monday to help with the swelling.  Reports compression stockings only helping minimally.  She was started on spironolactone 12.5 mg daily and her torsemide was changed to furosemide 20 mg daily as needed.  Patient was seen by PharmD on 10/28. Jardiance was initiated and Lasix dose was changed from 20 mg once daily prn to twice daily prn. Patient called and reported Jardiance causing feet  swelling, joint aches and cramps at night Jardiance were held as patient thought it was causing problem and lasix dose was increased to 40 mg up to twice daily. In January patient self replaced furosemide e to torsemide 10 mg daily. Last BMP on Jan 24 her lytes and renal function was WNL. Pt saw Dr.Chandrasekhar on Jan 27 torsemide was switched back to furosemide 40 mg daily   Today patient presented for follow up. She is requesting separate BMP lab papers as she is not ready to get her lipid lab done yet. Order new BMP lab. She has been checking her BP every day and it is ~115/70 range. Her weight is stable in fact she lost 2-3 lbs in past few weeks. Has some lower legs swelling but much better than before. Willing to try [place]- alternative to [place] if insurance covers it.  Willing to start Zetia too.    Current lipid medication: none  Intolerance: Crestor 10 mg   Current CHF meds: carvedilol 6.25mg  twice a day, valsartan 40 mg twice a day, furosemide 20 mg daily as needed, spironolactone 12.5 mg daily Other HTN meds: hydrochlorothiazide 25mg  daily  Previously tried: Jardiance: joint stiffness, muscle cramps at night  Adherence Assessment  Do  you ever forget to take your medication? [] Yes [] No  Do you ever skip doses due to side effects? [x] Yes [] No  Do you have trouble affording your medicines? [] Yes [x] No  Are you ever unable to pick up your medication due to transportation difficulties? [] Yes [x] No  Do you ever stop taking your medications because you don't believe they are helping? [] Yes [x] No  Do you check your weight daily? 250.4 lbs this morning lost 2-3 lbs  [x] Yes [] No    BP goal: <130/80  Family History:  Family History  Problem Relation Age of Onset   Hypertension Mother    Hyperlipidemia Mother    Alzheimer's disease Mother    Emphysema Father    COPD Father      Social History:  Social History   Socioeconomic History   Marital status: Divorced     Spouse name: Not on file   Number of children: 1   Years of education: Not on file   Highest education level: Some college, no degree  Occupational History    Comment: work from home  Tobacco Use   Smoking status: Never   Smokeless tobacco: Never  Vaping Use   Vaping status: Never Used  Substance and Sexual Activity   Alcohol use: No   Drug use: No   Sexual activity: Not Currently  Other Topics Concern   Not on file  Social History Narrative   Lives alone   Caffeine- coffee 2 c daily   Social Drivers of Health   Financial Resource Strain: Low Risk  (01/03/2018)   Overall Financial Resource Strain (CARDIA)    Difficulty of Paying Living Expenses: Not hard at all  Food Insecurity: No Food Insecurity (09/14/2021)   Hunger Vital Sign    Worried About Running Out of Food in the Last Year: Never true    Ran Out of Food in the Last Year: Never true  Transportation Needs: No Transportation Needs (01/03/2018)   PRAPARE - Administrator, Civil Service (Medical): No    Lack of Transportation (Non-Medical): No  Physical Activity: Insufficiently Active (01/03/2018)   Exercise Vital Sign    Days of Exercise per Week: 3 days    Minutes of Exercise per Session: 10 min  Stress: Stress Concern Present (01/03/2018)   [employer] of Occupational Health - Occupational Stress Questionnaire    Feeling of Stress : To some extent  Social Connections: Moderately Isolated (01/03/2018)   Social Connection and Isolation Panel [NHANES]    Frequency of Communication with Friends and Family: Twice a week    Frequency of Social Gatherings with Friends and Family: Twice a week    Attends Religious Services: Never    Database administrator or Organizations: No    Attends Banker Meetings: Never    Marital Status: Divorced  Catering manager Violence: Not At Risk (01/03/2018)   Humiliation, Afraid, Rape, and Kick questionnaire    Fear of Current or Ex-Partner: No    Emotionally  Abused: No    Physically Abused: No    Sexually Abused: No     Diet: Not discussed in detail today  Exercise: Very limited due to breaking her ankle.  Walks with a cane slowly  Home BP readings: 121/64 ,111/64, 98/58, 104/61, 104/58, 116/69, 136/69, 113/72  HR 61-72  Wt Readings from Last 3 Encounters:  09/16/23 250 lb 6.4 oz (113.6 kg)  08/22/23 252 lb 12.8 oz (114.7 kg)  05/04/23 258 lb (117 kg)  BP Readings from Last 3 Encounters:  09/16/23 110/60  08/22/23 118/60  05/23/23 120/70   Pulse Readings from Last 3 Encounters:  08/22/23 76  05/23/23 69  05/04/23 80    Renal function: CrCl cannot be calculated (Patient's most recent lab result is older than the maximum 21 days allowed.).  Past Medical History:  Diagnosis Date   Asymptomatic varicose veins    Cancer (HCC)    breast    Diverticulosis    DM type 2 (diabetes mellitus, type 2) (HCC) 10/23/2014   Dysuria    GERD (gastroesophageal reflux disease)    Hiatal hernia    Hyperlipidemia    Malignant neoplasm of breast (female), unspecified site    Migraine without aura, without mention of intractable migraine without mention of status migrainosus    Other abnormal blood chemistry    Pain in joint, pelvic region and thigh    Shingles    in eyes   Stricture and stenosis of esophagus    Trigger finger (acquired)    Unspecified cataract    Unspecified glaucoma(365.9)     Current Outpatient Medications on File Prior to Visit  Medication Sig Dispense Refill   Calcium-Phosphorus-Vitamin D (CITRACAL +D3 PO) Take 2 tablets by mouth daily with breakfast.     carvedilol (COREG) 6.25 MG tablet Take 1 tablet (6.25 mg total) by mouth 2 (two) times daily. 180 tablet 3   Cholecalciferol (VITAMIN D-3) 5000 units TABS Take 5,000 Units by mouth daily.     Cyanocobalamin 5000 MCG CAPS Take 5,000 mcg by mouth in the morning.     empagliflozin (JARDIANCE) 10 MG TABS tablet Take 1 tablet (10 mg total) by mouth daily before  breakfast. 90 tablet 3   fluorometholone (FML) 0.1 % ophthalmic suspension Place 1 drop into the right eye in the morning.     furosemide (LASIX) 40 MG tablet Take 1 tablet (40 mg total) by mouth daily. 90 tablet 3   HYDROcodone-acetaminophen (NORCO) 10-325 MG tablet Take 1 tablet by mouth every 6 (six) hours as needed for severe pain.     ibuprofen (ADVIL) 200 MG tablet Take 200 mg by mouth as needed for mild pain or headache.     omeprazole (PRILOSEC) 40 MG capsule Take 40 mg by mouth daily.     phenazopyridine (AZO-STANDARD) 95 MG tablet Take 1 tablet by mouth as needed.     polyethylene glycol (MIRALAX / GLYCOLAX) 17 g packet Take 17 g by mouth 2 (two) times daily. (Patient taking differently: Take 17 g by mouth as needed for moderate constipation.) 14 each 0   rosuvastatin (CRESTOR) 10 MG tablet Take 1 tablet (10 mg total) by mouth daily. 90 tablet 3   spironolactone (ALDACTONE) 25 MG tablet Take 0.5 tablets (12.5 mg total) by mouth daily. 90 tablet 3   timolol (TIMOPTIC) 0.5 % ophthalmic solution Place 1 drop into the right eye in the morning and at bedtime.     valACYclovir (VALTREX) 1000 MG tablet Take 1,000 mg by mouth every other day.     valsartan (DIOVAN) 40 MG tablet Take 1 tablet (40 mg total) by mouth 2 (two) times daily. 60 tablet 11   VOLTAREN 1 % GEL Apply 2-4 g topically 4 (four) times daily as needed (for pain (hands, neck, shoulders, etc..)).     No current facility-administered medications on file prior to visit.    Allergies  Allergen Reactions   Adhesive [Tape] Dermatitis and Other (See Comments)    Makes  the skin "blood red"   Cymbalta [Duloxetine Hcl] Other (See Comments)    sleepiness   Gabapentin Other (See Comments)    Sleepiness    Betimol [Timolol Maleate] Other (See Comments)    Allergic to preservatives - redness and affects eye pressure   Cosopt [Dorzolamide Hcl-Timolol Mal] Other (See Comments)    Turns eyes red, increases eye pressure   Penicillin G  Other (See Comments)    Reaction not recalled   Penicillins Other (See Comments)    Reaction not recalled   Prednisolone Acetate Other (See Comments)    Caused increased eye pressure   Tamoxifen Other (See Comments)    Vaginal bleeding   Thimerosal (Thiomersal) Other (See Comments)    Makes pt eyes red    Iodine-131 Rash and Other (See Comments)    Pt does need pre-meds for CT contrast per [facility] Radiology 10/20/2021 Dye from PET scan only, states has had since and did ok   Latex Rash     Assessment/Plan:  1. CHF -  HFrEF (heart failure with reduced ejection fraction) (HCC) Assessment: BP in office today 110/60 , home ~ 115/70  Patient has mild swelling on exam, denies any pain Denies any shortness of breath Takes lasix 40 mg daily in the morning and the days when swelling is bad, takes 40 mg in the evening too  Appears she gets initial response to furosemide but then swelling increases throughout the day Discussed SGLT2 inhibitors, decrease in CV mortality and heart failure hospitalization, potential benefit in diuresis and healthwell grant Intolerance to Jardiance 10 mg daily  Patient is agreeable to trying [place]   Plan: Will assess coverage for Farxiga 10 mg  Continue carvedilol 6.25mg  twice a day, valsartan 40 mg twice a day, spironolactone 12.5 mg daily Check BMP today Follow-up in 6 weeks  Hyperlipidemia Assessment and Plan :  Last lipid lab done 2 years ago. Due for updated lab. Patient is planing to get it done in next few weeks. In the past she was on Crestor.stopped taking it due to some memory concerns. At last visit with Dr.Chandrasekhar she showed interest in starting non-statin treatment. So we will initiate Zetia 10 mg daily.     Thank you   [name], Pharm.D, BCACP, BCPS, CPP [facility] of  [facility] [address], [place], [place] [address]  Phone: [phone]; Fax: [phone]

## 2023-09-16 NOTE — Patient Instructions (Signed)
Start taking ezetimibe 10 mg daily at supper. Please get BMP lab done today after visit. We will assess coverage for Farxiga - Jardiance alternative

## 2023-09-17 LAB — BASIC METABOLIC PANEL
BUN/Creatinine Ratio: 23 (ref 12–28)
BUN: 24 mg/dL (ref 8–27)
CO2: 25 mmol/L (ref 20–29)
Calcium: 10.5 mg/dL — ABNORMAL HIGH (ref 8.7–10.3)
Chloride: 96 mmol/L (ref 96–106)
Creatinine, Ser: 1.06 mg/dL — ABNORMAL HIGH (ref 0.57–1.00)
Glucose: 103 mg/dL — ABNORMAL HIGH (ref 70–99)
Potassium: 5.3 mmol/L — ABNORMAL HIGH (ref 3.5–5.2)
Sodium: 137 mmol/L (ref 134–144)
eGFR: 54 mL/min/{1.73_m2} — ABNORMAL LOW (ref >59)

## 2023-09-18 ENCOUNTER — Encounter: Payer: Self-pay | Admitting: Internal Medicine

## 2023-09-19 ENCOUNTER — Other Ambulatory Visit (HOSPITAL_COMMUNITY): Payer: Self-pay

## 2023-09-19 ENCOUNTER — Telehealth: Payer: Self-pay | Admitting: Pharmacy Technician

## 2023-09-19 MED ORDER — DAPAGLIFLOZIN PROPANEDIOL 10 MG PO TABS
10.0000 mg | ORAL_TABLET | Freq: Every day | ORAL | 11 refills | Status: DC
Start: 2023-09-19 — End: 2023-09-20
  Filled 2023-09-19 (×3): qty 30, 30d supply, fill #0

## 2023-09-19 NOTE — Telephone Encounter (Signed)
 Pharmacy Patient Advocate Encounter   Received notification from Pt Calls Messages that prior authorization for Marcelline Deist is required/requested.   Insurance verification completed.   The patient is insured through  [employer]  .   Per test claim: The current 09/19/23 day co-pay is, $$134.04- one month.  No PA needed at this time. This test claim was processed through [facility]- copay amounts may vary at other pharmacies due to pharmacy/plan contracts, or as the patient moves through the different stages of their insurance plan.

## 2023-09-19 NOTE — Addendum Note (Signed)
 Addended by: Tylene Fantasia on: 09/19/2023 03:03 PM   Modules accepted: Orders

## 2023-09-19 NOTE — Telephone Encounter (Signed)
 Patient has been enrolled in the grant so she will start taking [place].

## 2023-09-20 ENCOUNTER — Other Ambulatory Visit (HOSPITAL_COMMUNITY): Payer: Self-pay

## 2023-09-20 MED ORDER — DAPAGLIFLOZIN PROPANEDIOL 10 MG PO TABS: 10.0000 mg | ORAL_TABLET | Freq: Every day | ORAL | 11 refills | Status: DC

## 2023-09-20 NOTE — Addendum Note (Signed)
 Addended by: Tylene Fantasia on: 09/20/2023 10:01 AM   Modules accepted: Orders

## 2023-09-28 NOTE — Telephone Encounter (Signed)
 Echo results routed to PCP via Epic route.  Sent to Alcide Clever, NP fax # [phone].

## 2023-10-14 NOTE — Telephone Encounter (Signed)
 PA approved see other encounter for detail

## 2023-10-19 ENCOUNTER — Ambulatory Visit (INDEPENDENT_AMBULATORY_CARE_PROVIDER_SITE_OTHER): Payer: Medicare Other | Admitting: Podiatry

## 2023-10-19 ENCOUNTER — Encounter: Payer: Self-pay | Admitting: Podiatry

## 2023-10-19 DIAGNOSIS — M79675 Pain in left toe(s): Secondary | ICD-10-CM | POA: Diagnosis not present

## 2023-10-19 DIAGNOSIS — B351 Tinea unguium: Secondary | ICD-10-CM

## 2023-10-19 DIAGNOSIS — M79674 Pain in right toe(s): Secondary | ICD-10-CM | POA: Diagnosis not present

## 2023-10-19 DIAGNOSIS — E1142 Type 2 diabetes mellitus with diabetic polyneuropathy: Secondary | ICD-10-CM

## 2023-10-19 NOTE — Progress Notes (Signed)
Complaint:  Visit Type: Patient returns to my office for continued preventative foot care services. Complaint: Patient states" my third toenails both feet  have grown long and thick and become painful to walk and wear shoes"  The patient presents for preventative foot care services. No changes to ROS.    Podiatric Exam: Vascular: dorsalis pedis and posterior tibial pulses are weakly  palpable bilateral due to foot swelling.. Capillary return is immediate. Temperature gradient is WNL. Skin turgor WNL  Sensorium: Normal Semmes Weinstein monofilament test. Normal tactile sensation bilaterally. Nail Exam: Pt has thick disfigured discolored nails with subungual debris noted first and third third toenails both feet. Ulcer Exam: There is no evidence of ulcer or pre-ulcerative changes or infection. Orthopedic Exam: Muscle tone and strength are WNL. No limitations in general ROM. No crepitus or effusions noted. Foot type and digits show no abnormalities. Bony prominences are unremarkable. Skin: No Porokeratosis. No infection or ulcers  Diagnosis:  Onychomycosis, , Pain in right toe, pain in left toes  Treatment & Plan Procedures and Treatment: Consent by patient was obtained for treatment procedures.   Debridement of mycotic and hypertrophic toenails, 1 through 5 bilateral and clearing of subungual debris. No ulceration, no infection noted.   Return Visit-Office Procedure: Patient instructed to return to the office for a follow up visit 4 months   for continued evaluation and treatment.    Helane Gunther DPM

## 2023-11-07 ENCOUNTER — Encounter: Payer: Self-pay | Admitting: Pharmacist

## 2023-11-07 ENCOUNTER — Ambulatory Visit: Payer: Medicare Other | Attending: Cardiology | Admitting: Pharmacist

## 2023-11-07 VITALS — BP 110/60 | HR 75 | Ht 64.0 in | Wt 249.6 lb

## 2023-11-07 DIAGNOSIS — E1101 Type 2 diabetes mellitus with hyperosmolarity with coma: Secondary | ICD-10-CM

## 2023-11-07 DIAGNOSIS — Z6839 Body mass index (BMI) 39.0-39.9, adult: Secondary | ICD-10-CM | POA: Diagnosis not present

## 2023-11-07 DIAGNOSIS — E6609 Other obesity due to excess calories: Secondary | ICD-10-CM

## 2023-11-07 DIAGNOSIS — E66812 Obesity, class 2: Secondary | ICD-10-CM | POA: Diagnosis present

## 2023-11-07 NOTE — Progress Notes (Signed)
 Patient ID: [name]                 DOB: [birth year]                      MRN: [id number]     HPI: [name] is a 78 y.o. female referred by Dr. Eldridge Dace to pharmacy clinic for HF medication management. PMH is significant for pre-DM, melanoma, new dx of HFrEF. Most recent LVEF 25% on 03/10/23, CAD, Coronary calcium score of 85.9. This was 59 percentile for age and sex matched control.  Patient seen in clinic 10/9. She reported chronic swelling after her fall last year when she broke her ankle.  She was taking torsemide 10 mg daily for several days in a row but then would get some cystitis symptoms.  She would stop the torsemide for a few days and take her Azo and then resume her torsemide.  Home blood pressure cuff found to be accurate.  She was concerned about cost of medications, even though we did discuss the [employer]well grant.  She wanted to try one of the older medications first therefore her HCTZ was stopped and she was started on valsartan 40 mg twice daily.    She contacted the clinic 10/17 with concerns about swelling after stopping her hydrochlorothiazide. She was  instructed  to increase her torsemide from 10 mg to 20 mg however she called the next day stating that after taking the higher dose she peed excessively and then got to the point where she felt like she needed to pee but could not with having a lot of pressure like when she has cystitis.  Did not want to take the torsemide anymore.  I asked her to find her compression stockings and to hold the torsemide through the weekend.  She came in for lab work on Monday, kidney function stable potassium 4.9.  She had taken 1 dose of torsemide on Monday to help with the swelling.  Reports compression stockings only helping minimally.  She was started on spironolactone 12.5 mg daily and her torsemide was changed to furosemide 20 mg daily as needed.  Patient was seen by PharmD on 10/28. Jardiance was initiated and Lasix dose was changed from  20 mg once daily prn to twice daily prn. Patient called and reported Jardiance causing feet swelling, joint aches and cramps at night Jardiance were held as patient thought it was causing problem and lasix dose was increased to 40 mg up to twice daily. In January patient self replaced furosemide e to torsemide 10 mg daily. Last BMP on Jan 24 her electrolytes and renal function was WNL. Pt saw Dr.Chandrasekhar on Jan 27 torsemide was switched back to furosemide 40 mg daily   Today patient presented for for follow up reports she took [place] for about 5 days noted her swelling improved but from 6 the day on she started experiencing stiffness in her lower legs initially and them moved to upper body too. Since then she is off of [place]. Couple days ago she started having same lower legs stiffness so she is thinking it may not be from [place]. She has not started Zetia yet. Takes furosemide 40 mg (2X20mg  tabs) in the morning everyday and repeat the dose in the evening if needed for swelling. From past couple of months she has been taking only morning dose. She does not add salt when she cook her food but she still adds salt to her cooked  food. Due to multiple ankle injuries and surgeries her mobility is limited. Try to stay active with her puppy.    Current lipid medication: none  Intolerance: Crestor 10 mg   Current CHF meds: carvedilol 6.25mg  twice a day, valsartan 40 mg twice a day, furosemide 20 mg daily as needed, spironolactone 12.5 mg daily Other HTN meds: hydrochlorothiazide 25mg  daily  Previously tried: Jardiance: joint stiffness, muscle cramps at night  Adherence Assessment  Do you ever forget to take your medication? [] Yes [x] No  Do you ever skip doses due to side effects? [x] Yes [] No  Do you have trouble affording your medicines? [] Yes [x] No  Are you ever unable to pick up your medication due to transportation difficulties? [] Yes [x] No  Do you ever stop taking your medications because  you don't believe they are helping? [] Yes [x] No  Do you check your weight daily? 249.9lbs [x] Yes [] No    BP goal: <130/80  Family History:  Family History  Problem Relation Age of Onset   Hypertension Mother    Hyperlipidemia Mother    Alzheimer's disease Mother    Emphysema Father    COPD Father      Social History:  Social History   Socioeconomic History   Marital status: Divorced    Spouse name: Not on file   Number of children: 1   Years of education: Not on file   Highest education level: Some college, no degree  Occupational History    Comment: work from home  Tobacco Use   Smoking status: Never   Smokeless tobacco: Never  Vaping Use   Vaping status: Never Used  Substance and Sexual Activity   Alcohol use: No   Drug use: No   Sexual activity: Not Currently  Other Topics Concern   Not on file  Social History Narrative   Lives alone   Caffeine- coffee 2 c daily   Social Drivers of Health   Financial Resource Strain: Low Risk  (01/03/2018)   Overall Financial Resource Strain (CARDIA)    Difficulty of Paying Living Expenses: Not hard at all  Food Insecurity: No Food Insecurity (09/14/2021)   Hunger Vital Sign    Worried About Running Out of Food in the Last Year: Never true    Ran Out of Food in the Last Year: Never true  Transportation Needs: No Transportation Needs (01/03/2018)   PRAPARE - Administrator, Civil Service (Medical): No    Lack of Transportation (Non-Medical): No  Physical Activity: Insufficiently Active (01/03/2018)   Exercise Vital Sign    Days of Exercise per Week: 3 days    Minutes of Exercise per Session: 10 min  Stress: Stress Concern Present (01/03/2018)   [employer] of Occupational Health - Occupational Stress Questionnaire    Feeling of Stress : To some extent  Social Connections: Moderately Isolated (01/03/2018)   Social Connection and Isolation Panel [NHANES]    Frequency of Communication with Friends and  Family: Twice a week    Frequency of Social Gatherings with Friends and Family: Twice a week    Attends Religious Services: Never    Database administrator or Organizations: No    Attends Banker Meetings: Never    Marital Status: Divorced  Catering manager Violence: Not At Risk (01/03/2018)   Humiliation, Afraid, Rape, and Kick questionnaire    Fear of Current or Ex-Partner: No    Emotionally Abused: No    Physically Abused: No    Sexually Abused: No  Diet: Not discussed in detail today  Exercise:  limited mobility   Home BP readings:100/55     Wt Readings from Last 3 Encounters:  09/16/23 250 lb 6.4 oz (113.6 kg)  08/22/23 252 lb 12.8 oz (114.7 kg)  05/04/23 258 lb (117 kg)   BP Readings from Last 3 Encounters:  09/16/23 110/60  08/22/23 118/60  05/23/23 120/70   Pulse Readings from Last 3 Encounters:  08/22/23 76  05/23/23 69  05/04/23 80    Renal function: CrCl cannot be calculated (Patient's most recent lab result is older than the maximum 21 days allowed.).  Past Medical History:  Diagnosis Date   Asymptomatic varicose veins    Cancer (HCC)    breast    Diverticulosis    DM type 2 (diabetes mellitus, type 2) (HCC) 10/23/2014   Dysuria    GERD (gastroesophageal reflux disease)    Hiatal hernia    Hyperlipidemia    Malignant neoplasm of breast (female), unspecified site    Migraine without aura, without mention of intractable migraine without mention of status migrainosus    Other abnormal blood chemistry    Pain in joint, pelvic region and thigh    Shingles    in eyes   Stricture and stenosis of esophagus    Trigger finger (acquired)    Unspecified cataract    Unspecified glaucoma(365.9)     Current Outpatient Medications on File Prior to Visit  Medication Sig Dispense Refill   Calcium-Phosphorus-Vitamin D (CITRACAL +D3 PO) Take 2 tablets by mouth daily with breakfast.     carvedilol (COREG) 6.25 MG tablet Take 1 tablet (6.25 mg  total) by mouth 2 (two) times daily. 180 tablet 3   Cholecalciferol (VITAMIN D-3) 5000 units TABS Take 5,000 Units by mouth daily.     Cyanocobalamin 5000 MCG CAPS Take 5,000 mcg by mouth in the morning.     dapagliflozin propanediol (FARXIGA) 10 MG TABS tablet Take 1 tablet (10 mg total) by mouth daily before breakfast. 30 tablet 11   ezetimibe (ZETIA) 10 MG tablet Take 1 tablet (10 mg total) by mouth daily. 90 tablet 3   fluorometholone (FML) 0.1 % ophthalmic suspension Place 1 drop into the right eye in the morning.     furosemide (LASIX) 40 MG tablet Take 1 tablet (40 mg total) by mouth daily. 90 tablet 3   HYDROcodone-acetaminophen (NORCO) 10-325 MG tablet Take 1 tablet by mouth every 6 (six) hours as needed for severe pain.     ibuprofen (ADVIL) 200 MG tablet Take 200 mg by mouth as needed for mild pain or headache.     omeprazole (PRILOSEC) 40 MG capsule Take 40 mg by mouth daily.     phenazopyridine (AZO-STANDARD) 95 MG tablet Take 1 tablet by mouth as needed.     polyethylene glycol (MIRALAX / GLYCOLAX) 17 g packet Take 17 g by mouth 2 (two) times daily. (Patient taking differently: Take 17 g by mouth as needed for moderate constipation.) 14 each 0   rosuvastatin (CRESTOR) 10 MG tablet Take 1 tablet (10 mg total) by mouth daily. 90 tablet 3   spironolactone (ALDACTONE) 25 MG tablet Take 0.5 tablets (12.5 mg total) by mouth daily. 90 tablet 3   timolol (TIMOPTIC) 0.5 % ophthalmic solution Place 1 drop into the right eye in the morning and at bedtime.     valACYclovir (VALTREX) 1000 MG tablet Take 1,000 mg by mouth every other day.     valsartan (DIOVAN) 40 MG tablet  Take 1 tablet (40 mg total) by mouth 2 (two) times daily. 60 tablet 11   VOLTAREN 1 % GEL Apply 2-4 g topically 4 (four) times daily as needed (for pain (hands, neck, shoulders, etc..)).     No current facility-administered medications on file prior to visit.    Allergies  Allergen Reactions   Adhesive [Tape] Dermatitis  and Other (See Comments)    Makes the skin "blood red"   Cymbalta [Duloxetine Hcl] Other (See Comments)    sleepiness   Gabapentin Other (See Comments)    Sleepiness    Betimol [Timolol Maleate] Other (See Comments)    Allergic to preservatives - redness and affects eye pressure   Cosopt [Dorzolamide Hcl-Timolol Mal] Other (See Comments)    Turns eyes red, increases eye pressure   Penicillin G Other (See Comments)    Reaction not recalled   Penicillins Other (See Comments)    Reaction not recalled   Prednisolone Acetate Other (See Comments)    Caused increased eye pressure   Tamoxifen Other (See Comments)    Vaginal bleeding   Thimerosal (Thiomersal) Other (See Comments)    Makes pt eyes red    Iodine-131 Rash and Other (See Comments)    Pt does need pre-meds for CT contrast per [facility] 10/20/2021 Dye from PET scan only, states has had since and did ok   Latex Rash     Assessment/Plan:  1. CHF -  In office BP was 110/60 and heart rate 75. Tolerates current HF medications well carvedilol 6.25mg  twice a day, valsartan 40 mg twice a day, furosemide 40 mg twice daily as needed, spironolactone 12.5 mg daily Patient had tried Jardiance, was not able to tolerate due to muscle cramps and stiffness, same symptoms from [place] (tried for 5 days). However muscle stiffness returned off SGLT2i so willing to retry Farxiga as it helped to improve lower leg swelling. Continue taking carvedilol 6.25mg  twice a day, valsartan 40 mg twice a day, furosemide 40 mg twice daily  as needed, spironolactone 12.5 mg daily Start taking Farxiga 10 mg half tablet daily from 11/14/2022 will do follow up call on 11/16/2022 and discuss further changes ( may consider adjusting furosemide dose to avoid electrolyte imbalance) will get A1c checked today. Last A1c, 2 years ago, was in prediabetic range.   Thank you   Nickola Baron, Pharm.D  [facility] [address], [place], [place] [address]  Phone: [phone]; Fax: [phone]

## 2023-11-07 NOTE — Patient Instructions (Addendum)
 Changes made by your pharmacist Nickola Baron, PharmD at today's visit:    Instructions/Changes  (what do you need to do) Your Notes  (what you did and when you did it)  Start taking Farxiga 10 mg half tablet daily from 11/14/2022 will do follow up call on 11/16/2022 and discuss further changes    Continue taking carvedilol 6.25mg  twice a day, valsartan 40 mg twice a day, furosemide 40 mg twice daily  as needed, spironolactone 12.5 mg daily    Get A1c checked today     Bring all of your meds, your BP cuff and your record of home blood pressures to your next appointment.    HOW TO TAKE YOUR BLOOD PRESSURE AT HOME  Rest 5 minutes before taking your blood pressure.  Don't smoke or drink caffeinated beverages for at least 30 minutes before. Take your blood pressure before (not after) you eat. Sit comfortably with your back supported and both feet on the floor (don't cross your legs). Elevate your arm to heart level on a table or a desk. Use the proper sized cuff. It should fit smoothly and snugly around your bare upper arm. There should be enough room to slip a fingertip under the cuff. The bottom edge of the cuff should be 1 inch above the crease of the elbow. Ideally, take 3 measurements at one sitting and record the average.  Important lifestyle changes to control high blood pressure  Intervention  Effect on the BP  Lose extra pounds and watch your waistline Weight loss is one of the most effective lifestyle changes for controlling blood pressure. If you're overweight or obese, losing even a small amount of weight can help reduce blood pressure. Blood pressure might go down by about 1 millimeter of mercury (mm Hg) with each kilogram (about 2.2 pounds) of weight lost.  Exercise regularly As a general goal, aim for at least 30 minutes of moderate physical activity every day. Regular physical activity can lower high blood pressure by about 5 to 8 mm Hg.  Eat a healthy diet Eating a diet  rich in whole grains, fruits, vegetables, and low-fat dairy products and low in saturated fat and cholesterol. A healthy diet can lower high blood pressure by up to 11 mm Hg.  Reduce salt (sodium) in your diet Even a small reduction of sodium in the diet can improve heart health and reduce high blood pressure by about 5 to 6 mm Hg.  Limit alcohol One drink equals 12 ounces of beer, 5 ounces of wine, or 1.5 ounces of 80-proof liquor.  Limiting alcohol to less than one drink a day for women or two drinks a day for men can help lower blood pressure by about 4 mm Hg.   If you have any questions or concerns please use My Chart to send questions or call the office at [phone]

## 2023-11-08 LAB — HEMOGLOBIN A1C
Est. average glucose Bld gHb Est-mCnc: 140 mg/dL
Hgb A1c MFr Bld: 6.5 % — ABNORMAL HIGH (ref 4.8–5.6)

## 2023-11-10 ENCOUNTER — Encounter: Payer: Self-pay | Admitting: Pharmacist
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
## 2023-11-22 ENCOUNTER — Ambulatory Visit: Payer: Medicare Other | Attending: Nurse Practitioner | Admitting: Nurse Practitioner

## 2023-11-22 ENCOUNTER — Encounter: Payer: Self-pay | Admitting: Nurse Practitioner

## 2023-11-22 VITALS — BP 100/60 | HR 69 | Ht 64.0 in | Wt 248.8 lb

## 2023-11-22 DIAGNOSIS — E6609 Other obesity due to excess calories: Secondary | ICD-10-CM | POA: Diagnosis present

## 2023-11-22 DIAGNOSIS — Z01810 Encounter for preprocedural cardiovascular examination: Secondary | ICD-10-CM | POA: Diagnosis present

## 2023-11-22 DIAGNOSIS — E785 Hyperlipidemia, unspecified: Secondary | ICD-10-CM | POA: Diagnosis present

## 2023-11-22 DIAGNOSIS — E66812 Obesity, class 2: Secondary | ICD-10-CM | POA: Insufficient documentation

## 2023-11-22 DIAGNOSIS — I5020 Unspecified systolic (congestive) heart failure: Secondary | ICD-10-CM | POA: Diagnosis present

## 2023-11-22 DIAGNOSIS — I2510 Atherosclerotic heart disease of native coronary artery without angina pectoris: Secondary | ICD-10-CM | POA: Insufficient documentation

## 2023-11-22 DIAGNOSIS — I2583 Coronary atherosclerosis due to lipid rich plaque: Secondary | ICD-10-CM | POA: Insufficient documentation

## 2023-11-22 DIAGNOSIS — E1101 Type 2 diabetes mellitus with hyperosmolarity with coma: Secondary | ICD-10-CM | POA: Diagnosis present

## 2023-11-22 DIAGNOSIS — Z6839 Body mass index (BMI) 39.0-39.9, adult: Secondary | ICD-10-CM | POA: Diagnosis present

## 2023-11-22 NOTE — Patient Instructions (Signed)
 Medication Instructions:  Your physician recommends that you continue on your current medications as directed. Please refer to the Current Medication list given to you today. *If you need a refill on your cardiac medications before your next appointment, please call your pharmacy*  Lab Work: None ordered If you have labs (blood work) drawn today and your tests are completely normal, you will receive your results only by: MyChart Message (if you have MyChart) OR A paper copy in the mail If you have any lab test that is abnormal or we need to change your treatment, we will call you to review the results.  Testing/Procedures: None ordered  Follow-Up: At [facility], you and your health needs are our priority.  As part of our continuing mission to provide you with exceptional heart care, our providers are all part of one team.  This team includes your primary Cardiologist (physician) and Advanced Practice Providers or APPs (Physician Assistants and Nurse Practitioners) who all work together to provide you with the care you need, when you need it.  Your next appointment:   6 month(s)  Provider:   Charles Connor, NP   We recommend signing up for the patient portal called "MyChart".  Sign up information is provided on this After Visit Summary.  MyChart is used to connect with patients for Virtual Visits (Telemedicine).  Patients are able to view lab/test results, encounter notes, upcoming appointments, etc.  Non-urgent messages can be sent to your provider as well.   To learn more about what you can do with MyChart, go to [email or web address].   Other Instructions

## 2023-12-22 ENCOUNTER — Telehealth: Payer: Self-pay | Admitting: *Deleted

## 2023-12-22 NOTE — Telephone Encounter (Signed)
   Pre-operative Risk Assessment    Patient Name: [name]  DOB: 08/21/1945 MRN: [id number]   Date of last office visit: 11/22/23 Charles Connor, NP Date of next office visit: NONE  Request for Surgical Clearance    Procedure:  Dental Extraction - Amount of Teeth to be Pulled:  11 SURGICAL EXTRACTIONS  Date of Surgery:  Clearance TBD                                Surgeon:  DR. Donnita Gales, DDS Surgeon's Group or Practice Name:  [facility] Phone number:  [phone] Fax number:  [phone]   Type of Clearance Requested:   - Medical ; NONE INDICATED TO BE HELD   Type of Anesthesia:  IV SEDATION (MEDS THAT MAY BE USED: VERSED , ATROPINE, FENTANYL , DECADRON  AND SODIUM BREVITAL)   Additional requests/questions:    Princ[place] Broom   12/22/2023, 10:19 AM

## 2023-12-22 NOTE — Telephone Encounter (Signed)
   Patient Name: [name]  DOB: 1946/06/29 MRN: [id number]  Primary Cardiologist: Avery Bodo, MD  Chart reviewed as part of pre-operative protocol coverage. She was last seen in the office on 11/22/2023 by Charles Connor, NP, and was doing well.  She was cleared for a surgical procedure at the time.  Therefore, given past medical history and time since last visit, based on ACC/AHA guidelines, XYLAH EARLY is at acceptable risk for the planned procedure without further cardiovascular testing.   SBE prophylaxis is not required for this patient from a cardiac standpoint..  I will route this recommendation to the requesting party via Epic fax function and remove from pre-op pool.  Please call with questions.  Jude Norton, NP 12/22/2023, 11:15 AM

## 2024-01-04 ENCOUNTER — Encounter: Payer: Self-pay | Admitting: Pharmacist

## 2024-01-04 ENCOUNTER — Other Ambulatory Visit: Payer: Self-pay | Admitting: Internal Medicine

## 2024-01-04 ENCOUNTER — Ambulatory Visit: Attending: Cardiovascular Disease | Admitting: Pharmacist

## 2024-01-04 VITALS — BP 114/70 | HR 73

## 2024-01-04 DIAGNOSIS — I5020 Unspecified systolic (congestive) heart failure: Secondary | ICD-10-CM | POA: Diagnosis present

## 2024-01-04 MED ORDER — FUROSEMIDE 40 MG PO TABS: 40.0000 mg | ORAL_TABLET | Freq: Every day | ORAL | 3 refills | Status: AC

## 2024-01-04 NOTE — Patient Instructions (Signed)
 Changes made by your pharmacist Nickola Baron, PharmD at today's visit:    Instructions/Changes  (what do you need to do) Your Notes  (what you did and when you did it)  Increase Farxiga  dose to 10 mg daily and if tolerated well start taking ezetimibe  10 mg daily    Continue to follow low salt diet and drink at least 6-8 full glasses of water per day       If you have any questions or concerns please use My Chart to send questions or call the office at [phone]

## 2024-01-04 NOTE — Progress Notes (Signed)
 Patient ID: [name]                 DOB: 1946/02/08                      MRN: [id number]     HPI: [name] is a 78 y.o. female referred by [name] to pharmacy clinic for HF medication management. PMH is significant for pre-DM, melanoma, new dx of HFrEF. Most recent LVEF 25% on 03/10/23, CAD, Coronary calcium  score of 85.9. This was 22 percentile for age and sex matched control.  Patient seen in clinic 10/9. She reported chronic swelling after her fall last year when she broke her ankle.  She was taking torsemide  10 mg daily for several days in a row but then would get some cystitis symptoms.  She would stop the torsemide  for a few days and take her Azo and then resume her torsemide .  Home blood pressure cuff found to be accurate.  She was concerned about cost of medications, even though we did discuss the [employer] grant.  She wanted to try one of the older medications first therefore her HCTZ was stopped and she was started on valsartan  40 mg twice daily.    She contacted the clinic 10/17 with concerns about swelling after stopping her hydrochlorothiazide. She was  instructed  to increase her torsemide  from 10 mg to 20 mg however she called the next day stating that after taking the higher dose she peed excessively and then got to the point where she felt like she needed to pee but could not with having a lot of pressure like when she has cystitis.  Did not want to take the torsemide  anymore.  I asked her to find her compression stockings and to hold the torsemide  through the weekend.  She came in for lab work on Monday, kidney function stable potassium 4.9.  She had taken 1 dose of torsemide  on Monday to help with the swelling.  Reports compression stockings only helping minimally.  She was started on spironolactone  12.5 mg daily and her torsemide  was changed to furosemide  20 mg daily as needed.  Patient was seen by PharmD on 10/28. Jardiance  was initiated and Lasix  dose was changed from  20 mg once daily prn to twice daily prn. Patient called and reported Jardiance  causing feet swelling, joint aches and cramps at night Jardiance  were held as patient thought it was causing problem and lasix  dose was increased to 40 mg up to twice daily. In January patient self replaced furosemide  e to torsemide  10 mg daily. Last BMP on Jan 24 her electrolytes and renal function was WNL. Pt saw Dr.Chandrasekhar on Jan 27 torsemide  was switched back to furosemide  40 mg daily  From apt on 11/07/23 --reports she took Farxiga  for about 5 days noted her swelling improved but from 6 the day on she started experiencing stiffness in her lower legs initially and them moved to upper body too. Since then she is off of Farxiga . Couple days ago she started having same lower legs stiffness so she is thinking it may not be from Farxiga . She has not started Zetia  yet. Takes furosemide  40 mg (2X20mg  tabs) in the morning everyday and repeat the dose in the evening if needed for swelling. From past couple of months she has been taking only morning dose. She does not add salt when she cook her food but she still adds salt to her cooked food. Due to multiple  ankle injuries and surgeries her mobility is limited. Try to stay active with her puppy.   Today patient presented in good spirit. Reports she has been taking Farxiga  5 mg and tolerating that dose well and wiling to up the dose to 10 mg as it would be effective for heart function. She found out that her cancer came back and still waiting for definitive treatment plan. So from yesterday she is anxious her BP is elevated but still at goal. She ash not started Zetia  yet but once she try Farxiga  10 mg dose and do well with it she will start taking Zetia  10 mg.    Current CHF meds: carvedilol  6.25mg  twice a day, valsartan  40 mg twice a day, furosemide  20 mg daily as needed, spironolactone  12.5 mg daily, Farxiga  10 mg daily  Other HTN meds: hydrochlorothiazide 25mg   daily  Previously tried: Jardiance : joint stiffness, muscle cramps at night  Adherence Assessment  Do you ever forget to take your medication? [] Yes [x] No  Do you ever skip doses due to side effects? [x] Yes [] No  Do you have trouble affording your medicines? [] Yes [x] No  Are you ever unable to pick up your medication due to transportation difficulties? [] Yes [x] No  Do you ever stop taking your medications because you don't believe they are helping? [] Yes [x] No  Do you check your weight daily? 248 lbs  [x] Yes [] No    BP goal: <130/80  Family History:  Family History  Problem Relation Age of Onset   Hypertension Mother    Hyperlipidemia Mother    Alzheimer's disease Mother    Emphysema Father    COPD Father      Social History:  Social History   Socioeconomic History   Marital status: Divorced    Spouse name: Not on file   Number of children: 1   Years of education: Not on file   Highest education level: Some college, no degree  Occupational History    Comment: work from home  Tobacco Use   Smoking status: Never   Smokeless tobacco: Never  Vaping Use   Vaping status: Never Used  Substance and Sexual Activity   Alcohol use: No   Drug use: No   Sexual activity: Not Currently  Other Topics Concern   Not on file  Social History Narrative   Lives alone   Caffeine- coffee 2 c daily   Social Drivers of Health   Financial Resource Strain: Low Risk  (01/03/2018)   Overall Financial Resource Strain (CARDIA)    Difficulty of Paying Living Expenses: Not hard at all  Food Insecurity: Low Risk  (12/15/2023)   Received from [facility]   Hunger Vital Sign    Worried About Running Out of Food in the Last Year: Never true    Ran Out of Food in the Last Year: Never true  Transportation Needs: No Transportation Needs (12/15/2023)   Received from [facility]    In the past 12 months, has lack of reliable transportation kept you from medical  appointments, meetings, work or from getting things needed for daily living? : No  Physical Activity: Insufficiently Active (01/03/2018)   Exercise Vital Sign    Days of Exercise per Week: 3 days    Minutes of Exercise per Session: 10 min  Stress: Stress Concern Present (01/03/2018)   [employer] of Occupational Health - Occupational Stress Questionnaire    Feeling of Stress : To some extent  Social Connections: Moderately Isolated (01/03/2018)  Social Advertising account executive [NHANES]    Frequency of Communication with Friends and Family: Twice a week    Frequency of Social Gatherings with Friends and Family: Twice a week    Attends Religious Services: Never    Database administrator or Organizations: No    Attends Banker Meetings: Never    Marital Status: Divorced  Catering manager Violence: Not At Risk (01/03/2018)   Humiliation, Afraid, Rape, and Kick questionnaire    Fear of Current or Ex-Partner: No    Emotionally Abused: No    Physically Abused: No    Sexually Abused: No     Diet: Not discussed in detail today  Exercise:  limited mobility      Wt Readings from Last 3 Encounters:  11/22/23 248 lb 12.8 oz (112.9 kg)  11/07/23 249 lb 9.6 oz (113.2 kg)  09/16/23 250 lb 6.4 oz (113.6 kg)   BP Readings from Last 3 Encounters:  11/22/23 100/60  11/07/23 110/60  09/16/23 110/60   Pulse Readings from Last 3 Encounters:  11/22/23 69  11/07/23 75  08/22/23 76    Renal function: CrCl cannot be calculated (Patient's most recent lab result is older than the maximum 21 days allowed.).  Past Medical History:  Diagnosis Date   Asymptomatic varicose veins    Cancer (HCC)    breast    Diverticulosis    DM type 2 (diabetes mellitus, type 2) (HCC) 10/23/2014   Dysuria    GERD (gastroesophageal reflux disease)    Hiatal hernia    Hyperlipidemia    Malignant neoplasm of breast (female), unspecified site    Migraine without aura, without mention of  intractable migraine without mention of status migrainosus    Other abnormal blood chemistry    Pain in joint, pelvic region and thigh    Shingles    in eyes   Stricture and stenosis of esophagus    Trigger finger (acquired)    Unspecified cataract    Unspecified glaucoma(365.9)     Current Outpatient Medications on File Prior to Visit  Medication Sig Dispense Refill   Calcium -Phosphorus-Vitamin D  (CITRACAL +D3 PO) Take 2 tablets by mouth daily with breakfast.     carvedilol  (COREG ) 6.25 MG tablet Take 1 tablet (6.25 mg total) by mouth 2 (two) times daily. 180 tablet 3   Cholecalciferol  (VITAMIN D -3) 5000 units TABS Take 5,000 Units by mouth daily.     Cyanocobalamin  5000 MCG CAPS Take 5,000 mcg by mouth in the morning.     dapagliflozin  propanediol (FARXIGA ) 10 MG TABS tablet Take 5 mg by mouth daily. Pt takes 1/2 tablet once daily before breakfast.     ezetimibe  (ZETIA ) 10 MG tablet Take 1 tablet (10 mg total) by mouth daily. (Patient not taking: Reported on 11/22/2023) 90 tablet 3   fluorometholone  (FML) 0.1 % ophthalmic suspension Place 1 drop into the right eye in the morning.     furosemide  (LASIX ) 40 MG tablet Take 1 tablet (40 mg total) by mouth daily. 90 tablet 3   HYDROcodone -acetaminophen  (NORCO) 10-325 MG tablet Take 1 tablet by mouth every 4 (four) hours as needed for severe pain (pain score 7-10).     ibuprofen (ADVIL) 200 MG tablet Take 200 mg by mouth as needed for mild pain or headache.     omeprazole  (PRILOSEC) 40 MG capsule Take 40 mg by mouth daily.     phenazopyridine (AZO-STANDARD) 95 MG tablet Take 1 tablet by mouth as needed.  rosuvastatin  (CRESTOR ) 10 MG tablet Take 1 tablet (10 mg total) by mouth daily. (Patient not taking: Reported on 11/22/2023) 90 tablet 3   spironolactone  (ALDACTONE ) 25 MG tablet Take 0.5 tablets (12.5 mg total) by mouth daily. 90 tablet 3   timolol  (TIMOPTIC ) 0.5 % ophthalmic solution Place 1 drop into the right eye in the morning and at  bedtime.     valACYclovir  (VALTREX ) 1000 MG tablet Take 1,000 mg by mouth every other day.     valsartan  (DIOVAN ) 40 MG tablet Take 1 tablet (40 mg total) by mouth 2 (two) times daily. 60 tablet 11   VOLTAREN  1 % GEL Apply 2-4 g topically 4 (four) times daily as needed (for pain (hands, neck, shoulders, etc..)).     No current facility-administered medications on file prior to visit.    Allergies  Allergen Reactions   Adhesive [Tape] Dermatitis and Other (See Comments)    Makes the skin blood red   Cymbalta  [Duloxetine  Hcl] Other (See Comments)    sleepiness   Gabapentin  Other (See Comments)    Sleepiness    Betimol  [Timolol  Maleate] Other (See Comments)    Allergic to preservatives - redness and affects eye pressure   Cosopt [Dorzolamide Hcl-Timolol  Mal] Other (See Comments)    Turns eyes red, increases eye pressure   Penicillin G Other (See Comments)    Reaction not recalled   Penicillins Other (See Comments)    Reaction not recalled   Prednisolone Acetate Other (See Comments)    Caused increased eye pressure   Tamoxifen Other (See Comments)    Vaginal bleeding   Thimerosal (Thiomersal) Other (See Comments)    Makes pt eyes red    Latex Rash     Assessment/Plan:  1. CHF -  In office BP was 114/70 and heart rate 73. Tolerates current HF medications well carvedilol  6.25mg  twice a day, valsartan  40 mg twice a day, furosemide  40 mg twice daily as needed, spironolactone  12.5 mg daily and Farxiga  5 mg daily  Was not able to tolerate Farxiga  10 mg dose previously but now that she tolerates lower dose she is willing to try 10 mg daily. Follow up with PharmD in 6 weeks     Thank you   Nickola Baron, Pharm.D [facility] [address], [place], [place] [address]  Phone: [phone]; Fax: [phone]

## 2024-02-22 ENCOUNTER — Encounter: Payer: Self-pay | Admitting: Podiatry

## 2024-02-22 ENCOUNTER — Ambulatory Visit (INDEPENDENT_AMBULATORY_CARE_PROVIDER_SITE_OTHER): Admitting: Podiatry

## 2024-02-22 DIAGNOSIS — M79674 Pain in right toe(s): Secondary | ICD-10-CM | POA: Diagnosis not present

## 2024-02-22 DIAGNOSIS — B351 Tinea unguium: Secondary | ICD-10-CM

## 2024-02-22 DIAGNOSIS — E1142 Type 2 diabetes mellitus with diabetic polyneuropathy: Secondary | ICD-10-CM

## 2024-02-22 DIAGNOSIS — M79675 Pain in left toe(s): Secondary | ICD-10-CM

## 2024-02-22 NOTE — Progress Notes (Signed)
Complaint:  Visit Type: Patient returns to my office for continued preventative foot care services. Complaint: Patient states" my third toenails both feet  have grown long and thick and become painful to walk and wear shoes"  The patient presents for preventative foot care services. No changes to ROS.    Podiatric Exam: Vascular: dorsalis pedis and posterior tibial pulses are weakly  palpable bilateral due to foot swelling.. Capillary return is immediate. Temperature gradient is WNL. Skin turgor WNL  Sensorium: Normal Semmes Weinstein monofilament test. Normal tactile sensation bilaterally. Nail Exam: Pt has thick disfigured discolored nails with subungual debris noted first and third third toenails both feet. Ulcer Exam: There is no evidence of ulcer or pre-ulcerative changes or infection. Orthopedic Exam: Muscle tone and strength are WNL. No limitations in general ROM. No crepitus or effusions noted. Foot type and digits show no abnormalities. Bony prominences are unremarkable. Skin: No Porokeratosis. No infection or ulcers  Diagnosis:  Onychomycosis, , Pain in right toe, pain in left toes  Treatment & Plan Procedures and Treatment: Consent by patient was obtained for treatment procedures.   Debridement of mycotic and hypertrophic toenails, 1 through 5 bilateral and clearing of subungual debris. No ulceration, no infection noted.   Return Visit-Office Procedure: Patient instructed to return to the office for a follow up visit 4 months   for continued evaluation and treatment.    Helane Gunther DPM

## 2024-02-25 ENCOUNTER — Other Ambulatory Visit: Payer: Self-pay | Admitting: Interventional Cardiology

## 2024-03-22 ENCOUNTER — Ambulatory Visit: Admitting: Pharmacist

## 2024-04-25 ENCOUNTER — Encounter: Payer: Self-pay | Admitting: Pharmacist

## 2024-04-25 ENCOUNTER — Ambulatory Visit: Attending: Internal Medicine | Admitting: Pharmacist

## 2024-04-25 VITALS — BP 127/75 | HR 71

## 2024-04-25 DIAGNOSIS — I5020 Unspecified systolic (congestive) heart failure: Secondary | ICD-10-CM | POA: Diagnosis present

## 2024-04-25 MED ORDER — SPIRONOLACTONE 25 MG PO TABS: 25.0000 mg | ORAL_TABLET | Freq: Every day | ORAL | 3 refills | Status: AC

## 2024-04-25 NOTE — Patient Instructions (Addendum)
 Changes made by your pharmacist Andrianna Manalang E. Burney Calzadilla, PharmD at today's visit:    Instructions/Changes  (what do you need to do) Your Notes  (what you did and when you did it)  Stop taking Farxiga  5 mg daily   2. Start taking spironolactone  25 mg daily   3. Start taking furosemide  20 mg daily    4. Continue taking carvedilol  6.25mg  twice a day and valsartan  40 mg twice a day   5. Lab work in 2 weeks     Bring all of your meds, your BP cuff and your record of home blood pressures to your next appointment.    HOW TO TAKE YOUR BLOOD PRESSURE AT HOME  Rest 5 minutes before taking your blood pressure.  Don't smoke or drink caffeinated beverages for at least 30 minutes before. Take your blood pressure before (not after) you eat. Sit comfortably with your back supported and both feet on the floor (don't cross your legs). Elevate your arm to heart level on a table or a desk. Use the proper sized cuff. It should fit smoothly and snugly around your bare upper arm. There should be enough room to slip a fingertip under the cuff. The bottom edge of the cuff should be 1 inch above the crease of the elbow. Ideally, take 3 measurements at one sitting and record the average.  Important lifestyle changes to control high blood pressure  Intervention  Effect on the BP  Lose extra pounds and watch your waistline Weight loss is one of the most effective lifestyle changes for controlling blood pressure. If you're overweight or obese, losing even a small amount of weight can help reduce blood pressure. Blood pressure might go down by about 1 millimeter of mercury (mm Hg) with each kilogram (about 2.2 pounds) of weight lost.  Exercise regularly As a general goal, aim for at least 30 minutes of moderate physical activity every day. Regular physical activity can lower high blood pressure by about 5 to 8 mm Hg.  Eat a healthy diet Eating a diet rich in whole grains, fruits, vegetables, and low-fat dairy products  and low in saturated fat and cholesterol. A healthy diet can lower high blood pressure by up to 11 mm Hg.  Reduce salt (sodium) in your diet Even a small reduction of sodium in the diet can improve heart health and reduce high blood pressure by about 5 to 6 mm Hg.  Limit alcohol One drink equals 12 ounces of beer, 5 ounces of wine, or 1.5 ounces of 80-proof liquor.  Limiting alcohol to less than one drink a day for women or two drinks a day for men can help lower blood pressure by about 4 mm Hg.   If you have any questions or concerns please use My Chart to send questions or call the office at [phone]

## 2024-04-25 NOTE — Progress Notes (Unsigned)
 Patient ID: [name]                 DOB: January 05, 1946                      MRN: [id number]     HPI: [name] is a 78 y.o. female referred by Dr. Dann to pharmacy clinic for HF medication management. PMH is significant for pre-DM, melanoma, new dx of HFrEF. Most recent LVEF 25% on 03/10/23, CAD, Coronary calcium  score of 85.9. This was 23 percentile for age and sex matched control.  Patient seen in clinic 10/9. She reported chronic swelling after her fall last year when she broke her ankle.  She was taking torsemide  10 mg daily for several days in a row but then would get some cystitis symptoms.  She would stop the torsemide  for a few days and take her Azo and then resume her torsemide .  Home blood pressure cuff found to be accurate.  She was concerned about cost of medications, even though we did discuss the [employer] grant.  She wanted to try one of the older medications first therefore her HCTZ was stopped and she was started on valsartan  40 mg twice daily.    She contacted the clinic 10/17 with concerns about swelling after stopping her hydrochlorothiazide. She was  instructed  to increase her torsemide  from 10 mg to 20 mg however she called the next day stating that after taking the higher dose she peed excessively and then got to the point where she felt like she needed to pee but could not with having a lot of pressure like when she has cystitis.  Did not want to take the torsemide  anymore.  I asked her to find her compression stockings and to hold the torsemide  through the weekend.  She came in for lab work on Monday, kidney function stable potassium 4.9.  She had taken 1 dose of torsemide  on Monday to help with the swelling.  Reports compression stockings only helping minimally.  She was started on spironolactone  12.5 mg daily and her torsemide  was changed to furosemide  20 mg daily as needed.  Patient was seen by PharmD on 10/28. Jardiance  was initiated and Lasix  dose was changed from  20 mg once daily prn to twice daily prn. Patient called and reported Jardiance  causing feet swelling, joint aches and cramps at night Jardiance  were held as patient thought it was causing problem and lasix  dose was increased to 40 mg up to twice daily. In January patient self replaced furosemide  e to torsemide  10 mg daily. Last BMP on Jan 24 her electrolytes and renal function was WNL. Pt saw Dr.Chandrasekhar on Jan 27 torsemide  was switched back to furosemide  40 mg daily  From apt on 11/07/23 --reports she took Farxiga  for about 5 days noted her swelling improved but from 6 the day on she started experiencing stiffness in her lower legs initially and them moved to upper body too. Since then she is off of Farxiga . Couple days ago she started having same lower legs stiffness so she is thinking it may not be from Farxiga . She has not started Zetia  yet. Takes furosemide  40 mg (2X20mg  tabs) in the morning everyday and repeat the dose in the evening if needed for swelling. From past couple of months she has been taking only morning dose. She does not add salt when she cook her food but she still adds salt to her cooked food. Due to multiple  ankle injuries and surgeries her mobility is limited. Try to stay active with her puppy.   01/04/24: Patient presented in good spirit. Reports she has been taking Farxiga  5 mg and tolerating that dose well and wiling to up the dose to 10 mg as it would be effective for heart function. She found out that her cancer came back and still waiting for definitive treatment plan. So from yesterday she is anxious her BP is elevated but still at goal. She ash not started Zetia  yet but once she try Farxiga  10 mg dose and do well with it she will start taking Zetia  10 mg.   04/25/24: Patient presents today in good spirits. She reports having had a recent MRI of the hand, which revealed a new bone lesion. She has recently initiated treatment with Xgeva and has two upcoming PET scans scheduled.  Since her last appointment, she began taking Farxiga  5 mg but discontinued it 15 days ago due to difficulty walking. She states she has started feeling better since stopping the medication.  She has not been checking BP at home. She notes that her BP is at goal when it is taken during appointments (125/64; HR 69). She is willing to start back taking BP more frequently.   The patient believes she may have been dehydrated and notes her appetite is not as strong as usual. She has experienced swelling in her feet and has been taking Lasix  40 mg for symptom management. She remains physically inactive due to a previously fractured ankle.  She is currently not taking Zetia  and expresses no interest in starting it due to concerns about potential muscle aches and pain.  Current CHF meds: carvedilol  6.25mg  twice a day, valsartan  40 mg twice a day, furosemide  20 mg daily as needed, spironolactone  12.5 mg daily, Farxiga  10 mg daily  Previously tried: Jardiance : joint stiffness, muscle cramps at night  Pertinent Labs: (02/28/24) Scr  1.08 mg/dL; Crcl: 53 ml/min  (02/28/24) K+ 4.4 mmol/L (02/28/24) Na 132 mmol/L  Adherence Assessment  Do you ever forget to take your medication? [] Yes [x] No  Do you ever skip doses due to side effects? [x] Yes [] No  Do you have trouble affording your medicines? [] Yes [x] No  Are you ever unable to pick up your medication due to transportation difficulties? [] Yes [x] No  Do you ever stop taking your medications because you don't believe they are helping? [] Yes [x] No  Do you check your weight daily? 248 lbs  [x] Yes [] No    BP goal: <130/80  Family History:  Family History  Problem Relation Age of Onset   Hypertension Mother    Hyperlipidemia Mother    Alzheimer's disease Mother    Emphysema Father    COPD Father      Social History   Socioeconomic History   Marital status: Divorced    Spouse name: Not on file   Number of children: 1   Years of education: Not on  file   Highest education level: Some college, no degree  Occupational History    Comment: work from home  Tobacco Use   Smoking status: Never   Smokeless tobacco: Never  Vaping Use   Vaping status: Never Used  Substance and Sexual Activity   Alcohol use: No   Drug use: No   Sexual activity: Not Currently  Other Topics Concern   Not on file  Social History Narrative   Lives alone   Caffeine- coffee 2 c daily   Social Drivers of Health  Financial Resource Strain: Low Risk  (01/03/2018)   Overall Financial Resource Strain (CARDIA)    Difficulty of Paying Living Expenses: Not hard at all  Food Insecurity: Low Risk  (12/15/2023)   Received from [facility]   Hunger Vital Sign    Within the past 12 months, you worried that your food would run out before you got money to buy more: Never true    Within the past 12 months, the food you bought just didn't last and you didn't have money to get more. : Never true  Transportation Needs: No Transportation Needs (12/15/2023)   Received from [facility]   Transportation    In the past 12 months, has lack of reliable transportation kept you from medical appointments, meetings, work or from getting things needed for daily living? : No  Physical Activity: Insufficiently Active (01/03/2018)   Exercise Vital Sign    Days of Exercise per Week: 3 days    Minutes of Exercise per Session: 10 min  Stress: Stress Concern Present (01/03/2018)   [employer] of Occupational Health - Occupational Stress Questionnaire    Feeling of Stress : To some extent  Social Connections: Moderately Isolated (01/03/2018)   Social Connection and Isolation Panel    Frequency of Communication with Friends and Family: Twice a week    Frequency of Social Gatherings with Friends and Family: Twice a week    Attends Religious Services: Never    Database administrator or Organizations: No    Attends Banker Meetings: Never    Marital Status: Divorced   Catering manager Violence: Not At Risk (01/03/2018)   Humiliation, Afraid, Rape, and Kick questionnaire    Fear of Current or Ex-Partner: No    Emotionally Abused: No    Physically Abused: No    Sexually Abused: No     Diet: Not discussed today  Exercise:  limited mobility   Wt Readings from Last 3 Encounters:  11/22/23 248 lb 12.8 oz (112.9 kg)  11/07/23 249 lb 9.6 oz (113.2 kg)  09/16/23 250 lb 6.4 oz (113.6 kg)   BP Readings from Last 3 Encounters:  01/04/24 114/70  11/22/23 100/60  11/07/23 110/60   Pulse Readings from Last 3 Encounters:  01/04/24 73  11/22/23 69  11/07/23 75    Renal function: CrCl cannot be calculated (Patient's most recent lab result is older than the maximum 21 days allowed.).  Past Medical History:  Diagnosis Date   Asymptomatic varicose veins    Cancer (HCC)    breast    Diverticulosis    DM type 2 (diabetes mellitus, type 2) (HCC) 10/23/2014   Dysuria    GERD (gastroesophageal reflux disease)    Hiatal hernia    Hyperlipidemia    Malignant neoplasm of breast (female), unspecified site    Migraine without aura, without mention of intractable migraine without mention of status migrainosus    Other abnormal blood chemistry    Pain in joint, pelvic region and thigh    Shingles    in eyes   Stricture and stenosis of esophagus    Trigger finger (acquired)    Unspecified cataract    Unspecified glaucoma(365.9)     Current Outpatient Medications on File Prior to Visit  Medication Sig Dispense Refill   Calcium -Phosphorus-Vitamin D  (CITRACAL +D3 PO) Take 2 tablets by mouth daily with breakfast.     carvedilol  (COREG ) 6.25 MG tablet TAKE 1 TABLET BY MOUTH TWICE A DAY 180 tablet 2  Cholecalciferol  (VITAMIN D -3) 5000 units TABS Take 5,000 Units by mouth daily.     Cyanocobalamin  5000 MCG CAPS Take 5,000 mcg by mouth in the morning.     dapagliflozin  propanediol (FARXIGA ) 10 MG TABS tablet Take 1 tablet (10 mg total) by mouth daily before  breakfast.     ezetimibe  (ZETIA ) 10 MG tablet Take 1 tablet (10 mg total) by mouth daily. (Patient not taking: Reported on 11/22/2023) 90 tablet 3   fluorometholone  (FML) 0.1 % ophthalmic suspension Place 1 drop into the right eye in the morning.     furosemide  (LASIX ) 40 MG tablet Take 1 tablet (40 mg total) by mouth daily. 90 tablet 3   HYDROcodone -acetaminophen  (NORCO) 10-325 MG tablet Take 1 tablet by mouth every 4 (four) hours as needed for severe pain (pain score 7-10).     ibuprofen (ADVIL) 200 MG tablet Take 200 mg by mouth as needed for mild pain or headache.     omeprazole  (PRILOSEC) 40 MG capsule Take 40 mg by mouth daily.     phenazopyridine (AZO-STANDARD) 95 MG tablet Take 1 tablet by mouth as needed.     rosuvastatin  (CRESTOR ) 10 MG tablet Take 1 tablet (10 mg total) by mouth daily. (Patient not taking: Reported on 11/22/2023) 90 tablet 3   spironolactone  (ALDACTONE ) 25 MG tablet Take 0.5 tablets (12.5 mg total) by mouth daily. 90 tablet 3   timolol  (TIMOPTIC ) 0.5 % ophthalmic solution Place 1 drop into the right eye in the morning and at bedtime.     valACYclovir  (VALTREX ) 1000 MG tablet Take 1,000 mg by mouth every other day.     valsartan  (DIOVAN ) 40 MG tablet Take 1 tablet (40 mg total) by mouth 2 (two) times daily. 60 tablet 11   VOLTAREN  1 % GEL Apply 2-4 g topically 4 (four) times daily as needed (for pain (hands, neck, shoulders, etc..)).     No current facility-administered medications on file prior to visit.    Allergies  Allergen Reactions   Adhesive [Tape] Dermatitis and Other (See Comments)    Makes the skin blood red   Cymbalta  [Duloxetine  Hcl] Other (See Comments)    sleepiness   Gabapentin  Other (See Comments)    Sleepiness    Betimol  [Timolol  Maleate] Other (See Comments)    Allergic to preservatives - redness and affects eye pressure   Cosopt [Dorzolamide Hcl-Timolol  Mal] Other (See Comments)    Turns eyes red, increases eye pressure   Penicillin G Other  (See Comments)    Reaction not recalled   Penicillins Other (See Comments)    Reaction not recalled   Prednisolone Acetate Other (See Comments)    Caused increased eye pressure   Tamoxifen Other (See Comments)    Vaginal bleeding   Thimerosal (Thiomersal) Other (See Comments)    Makes pt eyes red    Latex Rash     Assessment/Plan:  1. CHF -  - In office BP was 127/55 and heart rate 71. Patient is tolerating carvedilol  6.25mg  twice a day and valsartan  40 mg twice a day well.  - Encouraged patient to check BP regularly  - She is currently unable to tolerate Farxiga  10 mg. Discontinue Farxiga  at this time - Most recent BMP reveals Na+ 132 mmol/L; furosemide  will be decreased to 20 mg daily - Spironolactone  12.5 mg will be increased to 25 mg daily - Follow up with PharmD in 5 weeks. Follow up BMP in 2 weeks   Thank you  I was present  in the room for this OV and I agree with assessment and plan   Carmella Kees E. Kya Mayfield, Pharm.D [place] [name]. [facility] [address], [place], [place] [address] Phone: [phone]; Fax: [phone]

## 2024-05-17 ENCOUNTER — Ambulatory Visit: Payer: Self-pay | Admitting: Pharmacist

## 2024-05-17 LAB — BMP8
BUN: 19 mg/dL (ref 8–27)
CO2: 24 mmol/L (ref 20–29)
Calcium: 10.3 mg/dL (ref 8.7–10.3)
Chloride: 99 mmol/L (ref 96–106)
Creatinine, Ser: 0.96 mg/dL (ref 0.57–1.00)
Glucose: 89 mg/dL (ref 70–99)
Potassium: 5 mmol/L (ref 3.5–5.2)
Sodium: 136 mmol/L (ref 134–144)
eGFR: 61 mL/min/1.73 (ref >59)

## 2024-05-24 ENCOUNTER — Ambulatory Visit: Admitting: Podiatry

## 2024-05-30 ENCOUNTER — Ambulatory Visit: Attending: Cardiology | Admitting: Pharmacist

## 2024-05-30 ENCOUNTER — Encounter: Payer: Self-pay | Admitting: Pharmacist

## 2024-05-30 VITALS — BP 126/75 | HR 71

## 2024-05-30 DIAGNOSIS — I5020 Unspecified systolic (congestive) heart failure: Secondary | ICD-10-CM | POA: Diagnosis present

## 2024-05-30 NOTE — Assessment & Plan Note (Signed)
 Assessment and Plan

## 2024-05-30 NOTE — Progress Notes (Signed)
 Patient ID: DEMARI KROPP                 DOB: [birth year]                      MRN: [id number]     HPI: [name] is a 78 y.o. female referred by Dr. Dann to pharmacy clinic for HF medication management. PMH is significant for pre-DM, melanoma, new dx of HFrEF. Most recent LVEF 25% on 03/10/23, CAD, Coronary calcium  score of 85.9. This was 40 percentile for age and sex matched control.  Patient seen in clinic 10/9. She reported chronic swelling after her fall last year when she broke her ankle.  She was taking torsemide  10 mg daily for several days in a row but then would get some cystitis symptoms.  She would stop the torsemide  for a few days and take her Azo and then resume her torsemide .  Home blood pressure cuff found to be accurate.  She was concerned about cost of medications, even though we did discuss the [employer] grant.  She wanted to try one of the older medications first therefore her HCTZ was stopped and she was started on valsartan  40 mg twice daily.    She contacted the clinic 10/17 with concerns about swelling after stopping her hydrochlorothiazide. She was  instructed  to increase her torsemide  from 10 mg to 20 mg however she called the next day stating that after taking the higher dose she peed excessively and then got to the point where she felt like she needed to pee but could not with having a lot of pressure like when she has cystitis.  Did not want to take the torsemide  anymore.  I asked her to find her compression stockings and to hold the torsemide  through the weekend.  She came in for lab work on Monday, kidney function stable potassium 4.9.  She had taken 1 dose of torsemide  on Monday to help with the swelling.  Reports compression stockings only helping minimally.  She was started on spironolactone  12.5 mg daily and her torsemide  was changed to furosemide  20 mg daily as needed.  Patient was seen by PharmD on 10/28. Jardiance  was initiated and Lasix  dose was changed from  20 mg once daily prn to twice daily prn. Patient called and reported Jardiance  causing feet swelling, joint aches and cramps at night Jardiance  were held as patient thought it was causing problem and lasix  dose was increased to 40 mg up to twice daily. In January patient self replaced furosemide  e to torsemide  10 mg daily. Last BMP on Jan 24 her electrolytes and renal function was WNL. Pt saw Dr.Chandrasekhar on Jan 27 torsemide  was switched back to furosemide  40 mg daily  From apt on 11/07/23 --reports she took Farxiga  for about 5 days noted her swelling improved but from 6 the day on she started experiencing stiffness in her lower legs initially and them moved to upper body too. Since then she is off of Farxiga . Couple days ago she started having same lower legs stiffness so she is thinking it may not be from Farxiga . She has not started Zetia  yet. Takes furosemide  40 mg (2X20mg  tabs) in the morning everyday and repeat the dose in the evening if needed for swelling. From past couple of months she has been taking only morning dose. She does not add salt when she cook her food but she still adds salt to her cooked food. Due to multiple  ankle injuries and surgeries her mobility is limited. Try to stay active with her puppy.   01/04/24: Patient presented in good spirit. Reports she has been taking Farxiga  5 mg and tolerating that dose well and wiling to up the dose to 10 mg as it would be effective for heart function. She found out that her cancer came back and still waiting for definitive treatment plan. So from yesterday she is anxious her BP is elevated but still at goal. She ash not started Zetia  yet but once she try Farxiga  10 mg dose and do well with it she will start taking Zetia  10 mg.   04/25/24: spironolactone  dose was increased and lasix  dose reduced to 20 mg daily.  Patient presented today for follow up. Reports with dose reduction in Lasix  she started getting lower extremities swelling back so she  switched back to 40 mg daily and cut back on spironolactone  to 12.5 mg daily.  She started having new pain/ numbness in her arm and fingers ( some time both arms and sometimes one)  PET is due, CT scan is done   Current CHF meds: carvedilol  6.25mg  twice a day, valsartan  40 mg twice a day, furosemide  40 mg daily as needed, spironolactone  12.5 mg daily   Previously tried: Farxiga  and Jardiance : joint stiffness, muscle cramps at night     Adherence Assessment  Do you ever forget to take your medication? [] Yes [x] No  Do you ever skip doses due to side effects? [x] Yes [] No  Do you have trouble affording your medicines? [] Yes [x] No  Are you ever unable to pick up your medication due to transportation difficulties? [] Yes [x] No  Do you ever stop taking your medications because you don't believe they are helping? [] Yes [x] No  Do you check your weight daily? 248 lbs  [x] Yes [] No    BP goal: <130/80  Family History:  Family History  Problem Relation Age of Onset   Hypertension Mother    Hyperlipidemia Mother    Alzheimer's disease Mother    Emphysema Father    COPD Father      Social History   Socioeconomic History   Marital status: Divorced    Spouse name: Not on file   Number of children: 1   Years of education: Not on file   Highest education level: Some college, no degree  Occupational History    Comment: work from home  Tobacco Use   Smoking status: Never   Smokeless tobacco: Never  Vaping Use   Vaping status: Never Used  Substance and Sexual Activity   Alcohol use: No   Drug use: No   Sexual activity: Not Currently  Other Topics Concern   Not on file  Social History Narrative   Lives alone   Caffeine- coffee 2 c daily   Social Drivers of Health   Financial Resource Strain: Low Risk  (01/03/2018)   Overall Financial Resource Strain (CARDIA)    Difficulty of Paying Living Expenses: Not hard at all  Food Insecurity: Low Risk  (12/15/2023)   Received from  [facility]   Hunger Vital Sign    Within the past 12 months, you worried that your food would run out before you got money to buy more: Never true    Within the past 12 months, the food you bought just didn't last and you didn't have money to get more. : Never true  Transportation Needs: No Transportation Needs (12/15/2023)   Received from [facility]  In the past 12 months, has lack of reliable transportation kept you from medical appointments, meetings, work or from getting things needed for daily living? : No  Physical Activity: Insufficiently Active (01/03/2018)   Exercise Vital Sign    Days of Exercise per Week: 3 days    Minutes of Exercise per Session: 10 min  Stress: Stress Concern Present (01/03/2018)   [employer] of Occupational Health - Occupational Stress Questionnaire    Feeling of Stress : To some extent  Social Connections: Moderately Isolated (01/03/2018)   Social Connection and Isolation Panel    Frequency of Communication with Friends and Family: Twice a week    Frequency of Social Gatherings with Friends and Family: Twice a week    Attends Religious Services: Never    Database Administrator or Organizations: No    Attends Banker Meetings: Never    Marital Status: Divorced  Catering Manager Violence: Not At Risk (01/03/2018)   Humiliation, Afraid, Rape, and Kick questionnaire    Fear of Current or Ex-Partner: No    Emotionally Abused: No    Physically Abused: No    Sexually Abused: No     Diet: Not discussed today  Exercise:  limited mobility   Wt Readings from Last 3 Encounters:  11/22/23 248 lb 12.8 oz (112.9 kg)  11/07/23 249 lb 9.6 oz (113.2 kg)  09/16/23 250 lb 6.4 oz (113.6 kg)   BP Readings from Last 3 Encounters:  04/25/24 127/75  01/04/24 114/70  11/22/23 100/60   Pulse Readings from Last 3 Encounters:  04/25/24 71  01/04/24 73  11/22/23 69    Renal function: CrCl cannot be calculated (Unknown  ideal weight.).  Past Medical History:  Diagnosis Date   Asymptomatic varicose veins    Cancer (HCC)    breast    Diverticulosis    DM type 2 (diabetes mellitus, type 2) (HCC) 10/23/2014   Dysuria    GERD (gastroesophageal reflux disease)    Hiatal hernia    Hyperlipidemia    Malignant neoplasm of breast (female), unspecified site    Migraine without aura, without mention of intractable migraine without mention of status migrainosus    Other abnormal blood chemistry    Pain in joint, pelvic region and thigh    Shingles    in eyes   Stricture and stenosis of esophagus    Trigger finger (acquired)    Unspecified cataract    Unspecified glaucoma(365.9)     Current Outpatient Medications on File Prior to Visit  Medication Sig Dispense Refill   Calcium -Phosphorus-Vitamin D  (CITRACAL +D3 PO) Take 2 tablets by mouth daily with breakfast.     carvedilol  (COREG ) 6.25 MG tablet TAKE 1 TABLET BY MOUTH TWICE A DAY 180 tablet 2   Cholecalciferol  (VITAMIN D -3) 5000 units TABS Take 5,000 Units by mouth daily.     Cyanocobalamin  5000 MCG CAPS Take 5,000 mcg by mouth in the morning.     dapagliflozin  propanediol (FARXIGA ) 10 MG TABS tablet Take 1 tablet (10 mg total) by mouth daily before breakfast.     fluorometholone  (FML) 0.1 % ophthalmic suspension Place 1 drop into the right eye in the morning.     furosemide  (LASIX ) 40 MG tablet Take 1 tablet (40 mg total) by mouth daily. 90 tablet 3   HYDROcodone -acetaminophen  (NORCO) 10-325 MG tablet Take 1 tablet by mouth every 4 (four) hours as needed for severe pain (pain score 7-10).     ibuprofen (ADVIL) 200 MG  tablet Take 200 mg by mouth as needed for mild pain or headache.     omeprazole  (PRILOSEC) 40 MG capsule Take 40 mg by mouth daily.     phenazopyridine (AZO-STANDARD) 95 MG tablet Take 1 tablet by mouth as needed.     spironolactone  (ALDACTONE ) 25 MG tablet Take 1 tablet (25 mg total) by mouth daily. 90 tablet 3   timolol  (TIMOPTIC ) 0.5 %  ophthalmic solution Place 1 drop into the right eye in the morning and at bedtime.     valACYclovir  (VALTREX ) 1000 MG tablet Take 1,000 mg by mouth every other day.     valsartan  (DIOVAN ) 40 MG tablet Take 1 tablet (40 mg total) by mouth 2 (two) times daily. 60 tablet 11   VOLTAREN  1 % GEL Apply 2-4 g topically 4 (four) times daily as needed (for pain (hands, neck, shoulders, etc..)).     No current facility-administered medications on file prior to visit.    Allergies  Allergen Reactions   Adhesive [Tape] Dermatitis and Other (See Comments)    Makes the skin blood red   Cymbalta  [Duloxetine  Hcl] Other (See Comments)    sleepiness   Gabapentin  Other (See Comments)    Sleepiness    Betimol  [Timolol  Maleate] Other (See Comments)    Allergic to preservatives - redness and affects eye pressure   Cosopt [Dorzolamide Hcl-Timolol  Mal] Other (See Comments)    Turns eyes red, increases eye pressure   Penicillin G Other (See Comments)    Reaction not recalled   Penicillins Other (See Comments)    Reaction not recalled   Prednisolone Acetate Other (See Comments)    Caused increased eye pressure   Tamoxifen Other (See Comments)    Vaginal bleeding   Thimerosal (Thiomersal) Other (See Comments)    Makes pt eyes red    Latex Rash     Assessment/Plan:  1. CHF -  In office and at home BP <120/80. Max tolerated GDMT carvedilol  6.25mg  twice a day, valsartan  40 mg twice a day, furosemide  40 mg daily as needed, spironolactone  12.5 mg daily. Intolerance to SGLT2i . Will continue with current HF medication. Follow up with MD in 8-12 months.   Thank you   Robbi Blanch, Pharm.D [place] [facility]. [facility] [address], [place], [place] [address] Phone: [phone]; Fax: [phone]

## 2024-06-06 ENCOUNTER — Ambulatory Visit (INDEPENDENT_AMBULATORY_CARE_PROVIDER_SITE_OTHER): Admitting: Podiatry

## 2024-06-06 ENCOUNTER — Encounter: Payer: Self-pay | Admitting: Podiatry

## 2024-06-06 DIAGNOSIS — E1142 Type 2 diabetes mellitus with diabetic polyneuropathy: Secondary | ICD-10-CM | POA: Diagnosis not present

## 2024-06-06 DIAGNOSIS — M79675 Pain in left toe(s): Secondary | ICD-10-CM

## 2024-06-06 DIAGNOSIS — B351 Tinea unguium: Secondary | ICD-10-CM | POA: Diagnosis not present

## 2024-06-06 DIAGNOSIS — M79674 Pain in right toe(s): Secondary | ICD-10-CM

## 2024-06-06 NOTE — Progress Notes (Signed)
Complaint:  Visit Type: Patient returns to my office for continued preventative foot care services. Complaint: Patient states" my third toenails both feet  have grown long and thick and become painful to walk and wear shoes"  The patient presents for preventative foot care services. No changes to ROS.    Podiatric Exam: Vascular: dorsalis pedis and posterior tibial pulses are weakly  palpable bilateral due to foot swelling.. Capillary return is immediate. Temperature gradient is WNL. Skin turgor WNL  Sensorium: Normal Semmes Weinstein monofilament test. Normal tactile sensation bilaterally. Nail Exam: Pt has thick disfigured discolored nails with subungual debris noted first and third third toenails both feet. Ulcer Exam: There is no evidence of ulcer or pre-ulcerative changes or infection. Orthopedic Exam: Muscle tone and strength are WNL. No limitations in general ROM. No crepitus or effusions noted. Foot type and digits show no abnormalities. Bony prominences are unremarkable. Skin: No Porokeratosis. No infection or ulcers  Diagnosis:  Onychomycosis, , Pain in right toe, pain in left toes  Treatment & Plan Procedures and Treatment: Consent by patient was obtained for treatment procedures.   Debridement of mycotic and hypertrophic toenails, 1 through 5 bilateral and clearing of subungual debris. No ulceration, no infection noted.   Return Visit-Office Procedure: Patient instructed to return to the office for a follow up visit 4 months   for continued evaluation and treatment.    Helane Gunther DPM

## 2024-06-28 ENCOUNTER — Telehealth: Payer: Self-pay | Admitting: *Deleted

## 2024-06-28 DIAGNOSIS — I5020 Unspecified systolic (congestive) heart failure: Secondary | ICD-10-CM

## 2024-06-28 NOTE — Telephone Encounter (Signed)
 Spoke with pt, echo and follow up appointment scheduled.

## 2024-06-28 NOTE — Telephone Encounter (Signed)
-----   Message from Stanly DELENA Leavens sent at 05/31/2024  9:28 AM EST ----- Thanks for reaching out - sounds like she is NYHA I-II and euvolemic - lets repeat an echocardiogram in December based on her HFrEF (Chronic Combined) - lets arrange f/u in three months with me or my APP team.  Stanly Leavens, MD FASE [facility] Cardiologist [facility]  [address] [place], [place] [address] [phone]  9:29 AM ----- Message ----- From: Tobie Robbi POUR, [facility] Sent: 05/30/2024   7:46 PM EST To: Stanly DELENA Leavens, MD  Ms. Aker is currently on maximally tolerated guideline-directed medical therapy for heart failure. However, there is no follow-up appointment scheduled with either you or an APP. The patient is wondering when she will be seen next by you or a member of the APP team

## 2024-07-11 ENCOUNTER — Telehealth: Payer: Self-pay | Admitting: Internal Medicine

## 2024-07-11 DIAGNOSIS — R0609 Other forms of dyspnea: Secondary | ICD-10-CM

## 2024-07-11 NOTE — Telephone Encounter (Signed)
 Called and spoke to pt. Her main complaint was feeling SOB today as she was laying in her recliner and then walked to bathroom, this is when she first noticed it. The episode lasted 20-30 min. This is the first time she has felt like this. States she normally breathes good. Denies CP or any other associated symptoms. BP 146/78, this is high for her as she normally is 110's (SBP). SpO2=95-96%., HR = 81. Also feels like her swelling is a bit worse than usual.   She has many other problems, currently. She was diagnosed with a severe Sinus Infection and Antibiotics were prescribed on 07/09/24. Also, has been dealing with numbness and extreme pain in both hands for the last 6-8 weeks. Has Melanoma but has yet to start the treatment.   She is on prescribed cardiac meds but does not take Farxiga  or Jardiance  as she did not tolerated these meds. Takes Spironolactone  12.5 mg once daily. Does take Lasix  40 mg once daily. Also takes Coreg  6.25 mg BID.   Advised pt to call her PCP and to stay very well hydrated. Also, to get back in touch with us  if she has additional symptoms. We will try to have her Echo moved up sooner if possible. Has OV with Chandrasekhar on 08/29/2024.

## 2024-07-11 NOTE — Telephone Encounter (Signed)
 Pt c/o Shortness Of Breath: STAT if SOB developed within the last 24 hours or pt is noticeably SOB on the phone  1. Are you currently SOB (can you hear that pt is SOB on the phone)? no  2. How long have you been experiencing SOB? Started this morning and lasted only 20 mins.  3. Are you SOB when sitting or when up moving around? When she was moving around, walking to the bathroom.    4. Are you currently experiencing any other symptoms? No other symptoms  She took her BP 146/78 & 143/73, she also check her pulse ox and it was 96%

## 2024-07-12 NOTE — Addendum Note (Signed)
 Addended by: VICCI ROXIE CROME on: 07/12/2024 10:00 AM   Modules accepted: Orders

## 2024-07-12 NOTE — Telephone Encounter (Signed)
 Shared message from Dr. Santo with patient.  BNP and BMP ordered. Patient states she will get to the lab as soon as she can.  Offered sooner appt with Dr. Santo on 08/15/24 at 9:00 AM, patient declined and states she can't do mornings. No other openings seen. Will forward to Dr. Milan nurse to see if patient can be added on to schedule in the afternoon.

## 2024-07-16 NOTE — Telephone Encounter (Signed)
 Called pt scheduled OV for Monday Aug 06, 2024 at 4 pm.  Pt asked if she can have labs drawn at another office; advised as long as office uses [facility].  Pt expresses understanding.

## 2024-07-26 LAB — BRAIN NATRIURETIC PEPTIDE: BNP: 59.3 pg/mL (ref 0.0–100.0)

## 2024-07-26 LAB — BASIC METABOLIC PANEL WITH GFR
BUN/Creatinine Ratio: 26 (ref 12–28)
BUN: 27 mg/dL (ref 8–27)
CO2: 24 mmol/L (ref 20–29)
Calcium: 10.6 mg/dL — ABNORMAL HIGH (ref 8.7–10.3)
Chloride: 95 mmol/L — ABNORMAL LOW (ref 96–106)
Creatinine, Ser: 1.03 mg/dL — ABNORMAL HIGH (ref 0.57–1.00)
Glucose: 168 mg/dL — ABNORMAL HIGH (ref 70–99)
Potassium: 4.8 mmol/L (ref 3.5–5.2)
Sodium: 136 mmol/L (ref 134–144)
eGFR: 55 mL/min/1.73 — ABNORMAL LOW

## 2024-08-06 ENCOUNTER — Ambulatory Visit: Admitting: Internal Medicine

## 2024-08-08 ENCOUNTER — Ambulatory Visit (HOSPITAL_COMMUNITY)
Admission: RE | Admit: 2024-08-08 | Discharge: 2024-08-08 | Disposition: A | Discharge status: Home / Self Care | Source: Ambulatory Visit | Attending: Cardiology | Admitting: Cardiology

## 2024-08-08 ENCOUNTER — Ambulatory Visit: Payer: Self-pay | Admitting: Internal Medicine

## 2024-08-08 DIAGNOSIS — I5020 Unspecified systolic (congestive) heart failure: Secondary | ICD-10-CM | POA: Insufficient documentation

## 2024-08-08 LAB — ECHOCARDIOGRAM COMPLETE
AR max vel: 1.22 cm2
AV Area VTI: 1.17 cm2
AV Area mean vel: 1.19 cm2
AV Mean grad: 15.3 mmHg
AV Peak grad: 28.7 mmHg
Ao pk vel: 2.68 m/s
Area-P 1/2: 3.13 cm2
S' Lateral: 4.3 cm

## 2024-08-13 ENCOUNTER — Other Ambulatory Visit: Payer: Self-pay | Admitting: Internal Medicine

## 2024-08-29 ENCOUNTER — Ambulatory Visit: Admitting: Internal Medicine

## 2024-09-06 ENCOUNTER — Ambulatory Visit: Admitting: Podiatry

## 2024-09-17 ENCOUNTER — Ambulatory Visit: Admitting: Internal Medicine

## 2024-09-20 ENCOUNTER — Ambulatory Visit: Admitting: Internal Medicine
